# Patient Record
Sex: Male | Born: 1955
Health system: Southern US, Community
[De-identification: ages and names within clinical notes are randomized; demographics above are authoritative.]

## PROBLEM LIST (undated history)

## (undated) DIAGNOSIS — C7951 Secondary malignant neoplasm of bone: Secondary | ICD-10-CM

## (undated) DIAGNOSIS — R0600 Dyspnea, unspecified: Secondary | ICD-10-CM

## (undated) DIAGNOSIS — F32A Depression, unspecified: Secondary | ICD-10-CM

## (undated) DIAGNOSIS — I779 Disorder of arteries and arterioles, unspecified: Secondary | ICD-10-CM

## (undated) DIAGNOSIS — E7800 Pure hypercholesterolemia, unspecified: Secondary | ICD-10-CM

## (undated) DIAGNOSIS — Z789 Other specified health status: Secondary | ICD-10-CM

## (undated) DIAGNOSIS — D329 Benign neoplasm of meninges, unspecified: Secondary | ICD-10-CM

## (undated) DIAGNOSIS — I6381 Other cerebral infarction due to occlusion or stenosis of small artery: Secondary | ICD-10-CM

## (undated) DIAGNOSIS — I723 Aneurysm of iliac artery: Secondary | ICD-10-CM

## (undated) DIAGNOSIS — I2510 Atherosclerotic heart disease of native coronary artery without angina pectoris: Secondary | ICD-10-CM

## (undated) DIAGNOSIS — C61 Malignant neoplasm of prostate: Secondary | ICD-10-CM

## (undated) DIAGNOSIS — E785 Hyperlipidemia, unspecified: Secondary | ICD-10-CM

## (undated) DIAGNOSIS — I214 Non-ST elevation (NSTEMI) myocardial infarction: Secondary | ICD-10-CM

## (undated) DIAGNOSIS — C801 Malignant (primary) neoplasm, unspecified: Secondary | ICD-10-CM

## (undated) DIAGNOSIS — F17200 Nicotine dependence, unspecified, uncomplicated: Secondary | ICD-10-CM

## (undated) DIAGNOSIS — D496 Neoplasm of unspecified behavior of brain: Secondary | ICD-10-CM

## (undated) DIAGNOSIS — I77819 Aortic ectasia, unspecified site: Secondary | ICD-10-CM

## (undated) DIAGNOSIS — R911 Solitary pulmonary nodule: Secondary | ICD-10-CM

## (undated) DIAGNOSIS — Z8739 Personal history of other diseases of the musculoskeletal system and connective tissue: Secondary | ICD-10-CM

## (undated) DIAGNOSIS — F1090 Alcohol use, unspecified, uncomplicated: Secondary | ICD-10-CM

## (undated) DIAGNOSIS — I10 Essential (primary) hypertension: Secondary | ICD-10-CM

---

## 2009-06-25 DIAGNOSIS — I2510 Atherosclerotic heart disease of native coronary artery without angina pectoris: Secondary | ICD-10-CM

## 2009-06-25 HISTORY — PX: CORONARY ANGIOPLASTY WITH STENT PLACEMENT: SHX49

## 2009-06-25 HISTORY — DX: Atherosclerotic heart disease of native coronary artery without angina pectoris: I25.10

## 2013-07-21 ENCOUNTER — Encounter (HOSPITAL_COMMUNITY): Payer: Self-pay | Admitting: Emergency Medicine

## 2013-07-21 ENCOUNTER — Observation Stay (HOSPITAL_COMMUNITY)
Admission: EM | Admit: 2013-07-21 | Discharge: 2013-07-22 | Disposition: A | Discharge status: Home / Self Care | Payer: Self-pay | Attending: Internal Medicine | Admitting: Internal Medicine

## 2013-07-21 ENCOUNTER — Emergency Department (HOSPITAL_COMMUNITY): Payer: Self-pay

## 2013-07-21 DIAGNOSIS — Z9861 Coronary angioplasty status: Secondary | ICD-10-CM | POA: Insufficient documentation

## 2013-07-21 DIAGNOSIS — Z91148 Patient's other noncompliance with medication regimen for other reason: Secondary | ICD-10-CM

## 2013-07-21 DIAGNOSIS — E7800 Pure hypercholesterolemia, unspecified: Secondary | ICD-10-CM | POA: Insufficient documentation

## 2013-07-21 DIAGNOSIS — R079 Chest pain, unspecified: Secondary | ICD-10-CM

## 2013-07-21 DIAGNOSIS — I10 Essential (primary) hypertension: Secondary | ICD-10-CM | POA: Diagnosis present

## 2013-07-21 DIAGNOSIS — Z9114 Patient's other noncompliance with medication regimen: Secondary | ICD-10-CM

## 2013-07-21 DIAGNOSIS — Z720 Tobacco use: Secondary | ICD-10-CM

## 2013-07-21 DIAGNOSIS — E785 Hyperlipidemia, unspecified: Secondary | ICD-10-CM | POA: Diagnosis present

## 2013-07-21 DIAGNOSIS — R0602 Shortness of breath: Secondary | ICD-10-CM | POA: Diagnosis present

## 2013-07-21 DIAGNOSIS — F17200 Nicotine dependence, unspecified, uncomplicated: Secondary | ICD-10-CM | POA: Diagnosis present

## 2013-07-21 DIAGNOSIS — R42 Dizziness and giddiness: Secondary | ICD-10-CM | POA: Diagnosis present

## 2013-07-21 DIAGNOSIS — I2510 Atherosclerotic heart disease of native coronary artery without angina pectoris: Secondary | ICD-10-CM | POA: Diagnosis present

## 2013-07-21 DIAGNOSIS — R0789 Other chest pain: Principal | ICD-10-CM | POA: Insufficient documentation

## 2013-07-21 DIAGNOSIS — F1010 Alcohol abuse, uncomplicated: Secondary | ICD-10-CM | POA: Diagnosis present

## 2013-07-21 DIAGNOSIS — I059 Rheumatic mitral valve disease, unspecified: Secondary | ICD-10-CM

## 2013-07-21 HISTORY — DX: Pure hypercholesterolemia, unspecified: E78.00

## 2013-07-21 HISTORY — DX: Hyperlipidemia, unspecified: E78.5

## 2013-07-21 HISTORY — DX: Essential (primary) hypertension: I10

## 2013-07-21 HISTORY — DX: Nicotine dependence, unspecified, uncomplicated: F17.200

## 2013-07-21 HISTORY — DX: Atherosclerotic heart disease of native coronary artery without angina pectoris: I25.10

## 2013-07-21 LAB — CBC
HCT: 42.4 % (ref 39.0–52.0)
Hemoglobin: 14.3 g/dL (ref 13.0–17.0)
MCH: 29 pg (ref 26.0–34.0)
MCHC: 33.7 g/dL (ref 30.0–36.0)
MCV: 86 fL (ref 78.0–100.0)
Platelets: 187 10*3/uL (ref 150–400)
RBC: 4.93 MIL/uL (ref 4.22–5.81)
RDW: 14.3 % (ref 11.5–15.5)
WBC: 5.4 10*3/uL (ref 4.0–10.5)

## 2013-07-21 LAB — BASIC METABOLIC PANEL
BUN: 12 mg/dL (ref 6–23)
CHLORIDE: 103 meq/L (ref 96–112)
CO2: 24 meq/L (ref 19–32)
Calcium: 9.2 mg/dL (ref 8.4–10.5)
Creatinine, Ser: 1.18 mg/dL (ref 0.50–1.35)
GFR calc non Af Amer: 68 mL/min — ABNORMAL LOW (ref >90)
GFR, EST AFRICAN AMERICAN: 79 mL/min — AB (ref >90)
Glucose, Bld: 97 mg/dL (ref 70–99)
Potassium: 4.7 mEq/L (ref 3.7–5.3)
Sodium: 141 mEq/L (ref 137–147)

## 2013-07-21 LAB — ETHANOL: Alcohol, Ethyl (B): <11 mg/dL (ref 0–11)

## 2013-07-21 LAB — TROPONIN I

## 2013-07-21 LAB — HEPATIC FUNCTION PANEL
ALT: 26 U/L (ref 0–53)
AST: 25 U/L (ref 0–37)
Albumin: 3.5 g/dL (ref 3.5–5.2)
Alkaline Phosphatase: 55 U/L (ref 39–117)
BILIRUBIN TOTAL: 0.3 mg/dL (ref 0.3–1.2)
Total Protein: 6.7 g/dL (ref 6.0–8.3)

## 2013-07-21 LAB — PRO B NATRIURETIC PEPTIDE: Pro B Natriuretic peptide (BNP): 30 pg/mL (ref 0–125)

## 2013-07-21 LAB — LIPID PANEL
CHOL/HDL RATIO: 3.9 ratio
CHOLESTEROL: 231 mg/dL — AB (ref 0–200)
HDL: 60 mg/dL (ref >39)
LDL Cholesterol: 144 mg/dL — ABNORMAL HIGH (ref 0–99)
Triglycerides: 134 mg/dL (ref <150)
VLDL: 27 mg/dL (ref 0–40)

## 2013-07-21 LAB — POCT I-STAT TROPONIN I: TROPONIN I, POC: 0.01 ng/mL (ref 0.00–0.08)

## 2013-07-21 LAB — PHOSPHORUS: Phosphorus: 3 mg/dL (ref 2.3–4.6)

## 2013-07-21 LAB — MAGNESIUM: Magnesium: 2 mg/dL (ref 1.5–2.5)

## 2013-07-21 MED ORDER — ASPIRIN EC 325 MG PO TBEC
325.0000 mg | DELAYED_RELEASE_TABLET | Freq: Once | ORAL | Status: AC
  Administered 2013-07-21: 325 mg via ORAL
  Filled 2013-07-21: qty 1

## 2013-07-21 MED ORDER — ACETAMINOPHEN 650 MG RE SUPP: 650.0000 mg | Freq: Four times a day (QID) | RECTAL | Status: DC | PRN

## 2013-07-21 MED ORDER — ASPIRIN EC 81 MG PO TBEC
81.0000 mg | DELAYED_RELEASE_TABLET | Freq: Every day | ORAL | Status: DC
  Administered 2013-07-22: 81 mg via ORAL
  Filled 2013-07-21: qty 1

## 2013-07-21 MED ORDER — FOLIC ACID 1 MG PO TABS
1.0000 mg | ORAL_TABLET | Freq: Every day | ORAL | Status: DC
  Administered 2013-07-21 – 2013-07-22 (×2): 1 mg via ORAL
  Filled 2013-07-21 (×2): qty 1

## 2013-07-21 MED ORDER — LISINOPRIL 10 MG PO TABS
10.0000 mg | ORAL_TABLET | Freq: Every day | ORAL | Status: DC
  Administered 2013-07-21 – 2013-07-22 (×2): 10 mg via ORAL
  Filled 2013-07-21 (×2): qty 1

## 2013-07-21 MED ORDER — THIAMINE HCL 100 MG/ML IJ SOLN
100.0000 mg | Freq: Every day | INTRAMUSCULAR | Status: DC
  Filled 2013-07-21 (×2): qty 1

## 2013-07-21 MED ORDER — NICOTINE 14 MG/24HR TD PT24
14.0000 mg | MEDICATED_PATCH | Freq: Every day | TRANSDERMAL | Status: DC
  Administered 2013-07-22: 14 mg via TRANSDERMAL
  Filled 2013-07-21 (×2): qty 1

## 2013-07-21 MED ORDER — ALBUTEROL SULFATE (2.5 MG/3ML) 0.083% IN NEBU
5.0000 mg | INHALATION_SOLUTION | Freq: Once | RESPIRATORY_TRACT | Status: DC
  Filled 2013-07-21: qty 6

## 2013-07-21 MED ORDER — LORAZEPAM 2 MG/ML IJ SOLN: 1.0000 mg | Freq: Four times a day (QID) | INTRAMUSCULAR | Status: DC | PRN

## 2013-07-21 MED ORDER — SODIUM CHLORIDE 0.9 % IJ SOLN
3.0000 mL | Freq: Two times a day (BID) | INTRAMUSCULAR | Status: DC
  Administered 2013-07-21: 3 mL via INTRAVENOUS

## 2013-07-21 MED ORDER — LORAZEPAM 1 MG PO TABS: 1.0000 mg | ORAL_TABLET | Freq: Four times a day (QID) | ORAL | Status: DC | PRN

## 2013-07-21 MED ORDER — ADULT MULTIVITAMIN W/MINERALS CH
1.0000 | ORAL_TABLET | Freq: Every day | ORAL | Status: DC
  Administered 2013-07-21 – 2013-07-22 (×2): 1 via ORAL
  Filled 2013-07-21 (×2): qty 1

## 2013-07-21 MED ORDER — ACETAMINOPHEN 325 MG PO TABS: 650.0000 mg | ORAL_TABLET | Freq: Four times a day (QID) | ORAL | Status: DC | PRN

## 2013-07-21 MED ORDER — ENOXAPARIN SODIUM 40 MG/0.4ML ~~LOC~~ SOLN
40.0000 mg | SUBCUTANEOUS | Status: DC
  Administered 2013-07-21: 40 mg via SUBCUTANEOUS
  Filled 2013-07-21 (×2): qty 0.4

## 2013-07-21 MED ORDER — VITAMIN B-1 100 MG PO TABS
100.0000 mg | ORAL_TABLET | Freq: Every day | ORAL | Status: DC
  Administered 2013-07-21 – 2013-07-22 (×2): 100 mg via ORAL
  Filled 2013-07-21 (×2): qty 1

## 2013-07-21 NOTE — H&P (Signed)
Date: 07/21/2013               Patient Name:  [name] [name] MRN: [id number]  DOB: [birth year] Age / Sex: 58 y.o., male   PCP: No primary provider on file.              Medical Service: Internal Medicine Teaching Service              Attending Physician: Dr. Dominic Pea, DO    First Contact: Maryclare Bean, Med Student Pager: [phone]  Second Contact: Dr. Lesly Dukes Pager: [phone]  Third Contact Dr. Karlyn Agee Pager: [phone]       After Hours (After 5p/  First Contact Pager: [phone]  weekends / holidays): Second Contact Pager: [phone]    Chief Complaint: shortness of breath  History of Present Illness: Mr. [name] [name] is a 58yo male with PMH of CAD s/p stent in 2011, HTN, and HLD who presents with worsening SOB and lightheadedness for the last 10 days. He states his SOB is worse with walking and improves with rest. His lightheadedness feel as if he is about to pass out, but he denies syncopal events. He denies chest pain, chest pressure, radiation, orthopnea, PND, or swelling. He denies leg pain, leg swelling, recent immobilization, or hemoptysis. He denies recent illness, fever, chills, night sweats, myalgias, sore throat, nasal congestion, cough or sick contacts. He denies vertigo-like symptoms, confusion or numbness. He denies abdominal pain or N/V/D/C. He denies similar symptoms in the past including his prior heart catheterization in 2011.  The patient recalls being previously treated with antihypertensive medications, Plavix, and other unknown medications after his stent in 2011 which was placed at [facility] in [place]. He was noncompliant with his medications and does not currently take aspirin. He does not have a PCP, is uninsured, and is unemployed. He recently moved from the [place], [place], to [place], [place], due to financial limitations and family in the area (currently lives with his mother, brother, and sister).  The patient currently smokes 0.5 ppd for the last 30 years and  denies prior diagnoses of pulmonary disease including asthma and emphysema. He consumes alcohol daily and drinks "two fifths per week" with his last drink occuring yesterday. He denies prior alcohol withdrawal symptoms including seizures. He previously smoked marijuana but denies other illicit drug use.     In the ED he was given aspirin 325 mg x 1 dose.  Meds: No current facility-administered medications for this encounter.   No current outpatient prescriptions on file.   Allergies: Allergies as of 07/21/2013  . (No Known Allergies)   Past Medical History: Past Medical History  Diagnosis Date  . High cholesterol   . Coronary artery disease     s/p stent   . HTN (hypertension)   . Smoker     1/2 ppd x >30 years    Past Surgical History: Past Surgical History  Procedure Laterality Date  . Coronary angioplasty with stent placement Left 2011   Family History: Family History  Problem Relation Age of Onset  . Hypertension      grandparents     Social History: History   Social History  . Marital Status: Single    Spouse Name: N/A    Number of Children: N/A  . Years of Education: N/A   Occupational History  . unemployed    Social History Main Topics  . Smoking status: Current Every Day Smoker -- 0.50 packs/day for 30 years  . Smokeless tobacco: Not  on file  . Alcohol Use: Yes     Comment: Drinks 2 fifths in one week (total of ~1539m)  . Drug Use: No  . Sexual Activity: Not on file   Other Topics Concern  . Not on file   [place] History Narrative   [place]    Unemployed    Drinks 2 (1/5)th in 1 week               Review of Systems: see HPI. Otherwise negative.  Physical Exam: Blood pressure 153/82, pulse 74, temperature 98.7 F (37.1 C), temperature source Oral, resp. rate 24, SpO2 98.00%. on RA. General: Alert, well-appearing, NAD. Appears stated age. HEENT: NCAT, PERRLA, conjunctiva  anicteric and without injection. Nares wnl and without discharge or erythema. Neck supple and without LAD. No tenderness to palpation. Pulmonary: CTAB, no incr WOB. No chest wall tenderness or deformity. Cardiovascular: RRR, S1 and S2 normal, no murmur, rub, or gallop appreciated. No carotid bruits or JVD. All four extremities warm, well-perfused, without cyanosis or edema. 2+ radial, DP, PT pulses bilaterally. Abdomen: Bowel sounds active. Soft, NTND, no masses, no hepatosplenomegaly.   Skin: Skin is warm and dry. He is not diaphoretic.   Neurologic/MSK: Alert and oriented x 3. GCS score is 15. Sensation intact throughout to light touch and pressure. Motor strength 5/5 in lower extremities bilaterally. No lower extremity tenderness to palpation. Psychiatric: Affect is blunted  Lab results: Recent Results (from the past 2160 hour(s))  PRO B NATRIURETIC PEPTIDE     Status: None   Collection Time    07/21/13 12:52 PM      Result Value Range   Pro B Natriuretic peptide (BNP) 30.0  0 - 125 pg/mL  CBC     Status: None   Collection Time    07/21/13  1:00 PM      Result Value Range   WBC 5.4  4.0 - 10.5 K/uL   RBC 4.93  4.22 - 5.81 MIL/uL   Hemoglobin 14.3  13.0 - 17.0 g/dL   HCT 42.4  39.0 - 52.0 %   MCV 86.0  78.0 - 100.0 fL   MCH 29.0  26.0 - 34.0 pg   MCHC 33.7  30.0 - 36.0 g/dL   RDW 14.3  11.5 - 15.5 %   Platelets 187  150 - 400 K/uL  BASIC METABOLIC PANEL     Status: Abnormal   Collection Time    07/21/13  1:00 PM      Result Value Range   Sodium 141  137 - 147 mEq/L   Potassium 4.7  3.7 - 5.3 mEq/L   Chloride 103  96 - 112 mEq/L   CO2 24  19 - 32 mEq/L   Glucose, Bld 97  70 - 99 mg/dL   BUN 12  6 - 23 mg/dL   Creatinine, Ser 1.18  0.50 - 1.35 mg/dL   Calcium 9.2  8.4 - 10.5 mg/dL   GFR calc non Af Amer 68 (*) >90 mL/min   GFR calc Af Amer 79 (*) >90 mL/min   Comment: (NOTE)     The eGFR has been calculated using the CKD EPI equation.     This calculation has not been  validated in all clinical situations.     eGFR's persistently <90 mL/min signify possible Chronic Kidney     Disease.  POCT I-STAT TROPONIN I     Status:  None   Collection Time    07/21/13  1:22 PM      Result Value Range   Troponin i, poc 0.01  0.00 - 0.08 ng/mL   Comment 3            Comment: Due to the release kinetics of cTnI,     a negative result within the first hours     of the onset of symptoms does not rule out     myocardial infarction with certainty.     If myocardial infarction is still suspected,     repeat the test at appropriate intervals.    Imaging results:  Dg Chest 2 View  07/21/2013   CLINICAL DATA:  Short of breath.  Dizziness.  Lightheadedness.  EXAM: CHEST  2 VIEW  COMPARISON:  None.  FINDINGS: Cardiopericardial silhouette within normal limits. Mediastinal contours normal. Trachea midline. No airspace disease or effusion. Mild hyperinflation suggesting early emphysema.  IMPRESSION: No active cardiopulmonary disease.   Electronically Signed   By: Dereck Ligas M.D.   On: 07/21/2013 14:31    Other results: EKG: There are no previous tracings available for comparison. NSR with rate of 88. Right atrial enlargement with possible right heart strain pattern (P7T0G2). T wave inversions present in Lead III only with no other ST or T wave abnormalities noted. QTc of 425.  Assessment & Plan by Problem: Principal Problem:   SOB (shortness of breath) on exertion Active Problems:   Dizziness   HTN (hypertension)   Tobacco abuse   Alcohol abuse   CAD (coronary artery disease)   H/O medication noncompliance  Mr. Prins is a 58yo male with PMH of CAD s/p stent in 2011, HTN, and HLD who presents with worsening SOB with exertion and lightheadedness for the last 10 days.  # SOB on exertion and lightheadedness, likely due to emphysema/COPD- Patient has no history of pulmonary disease and lacks PFTs on record but is a current smoker and has a 15 pack-year history. O2 sats  98+% on RA, VSS. CXR negative for acute cardiopulmonary disease but showed mild hyperinflation suggestive of early emphysema. He has PMH of CAD s/p stent with poor medication compliance but ACS or MI less likely given lack of chest pain or pressure, lack of edema, ProBNP wnl at 30. Point of care Troponin x 1 negative. EKG suggestive of right heart strain pattern (S1Q3T3) and RAE. He was given ASA 348m x 1 dose in the ED. Serial Troponins pending and repeat EKG in AM to r/o MI. TIMI score of 1-2 (low probability). PE much less likely with WELLS score of 0 and lack of recent immobilization. 1/4 SIRS criteria met (tachypnea to 35 but has since resolved) but infectious etiology much less likely given afebrile, lack of other systemic symptoms, unremarkable physical exam, and CBC wnl (WBC 5.4, Hb 14.3, Plt 187).  -Admit to IMTS with continuous pulse-ox and telemetry -Serial Troponins pending and repeat EKG in AM to r/o MI -Will repeat BMP in AM to assess for electrolyte abnormalities or changes -Mg and Phos pending -Will assess for orthostatic hypotension and desaturation with ambulation  -2D Echo pending to evaluate for evidence of heart failure and particularly right heart strain. Will consider stress test in AM. -Infectious etiology work-up pending: U/A, flu panel -Continue ASA 81 mg daily -Start Tylenol 650 mg q 6 hrs prn pain -[place] # Uncontrolled HTN - Patient is asymptomatic and was previously treated for HTN but denies  current medications due to noncompliance. BP 166/92 on admission with SBP max of 176. BMP wnl with BUN 12 and creatinine 1.18. -Start Lisinopril 87m daily and consider a different antihypertensive given prior treatment success  # Hyperlipidemia - Patient reports history of HLD but no lipid panel on record. Patient not currently taking any medications. -Lipid panel pending  # Tobacco abuse - Patient reports smoking 1/2 ppd for the last 30 years.  Will monitor pulse ox with ambulation and recommend outpatient PFTs if none on record.  -Nicotine patch offered but patient declined -Smoking cessation counseling prior to discharge  # Alcohol abuse - Patient reports drinking alcohol daily, consuming 2 fifths per week (~15051mtotal) with his last drink occurring yesterday. He denies prior withdrawal symptoms including seizures.  -CIWA protocol to monitor withdrawal symptoms -Ethyl alcohol and UDS level pending -Thiamine 100 mg daily -Folate 1 mg daily -Multivitamin 1 tablet daily  # Health maintenance - [place] does not see a PCP [place] - Hgb A1c pending - LFTs pending - Lipid panel pending - SW and Care Mgmt consulted  DVT PPx: Subcutaneous Lovenox and SCDs  Dispo: Disposition is deferred at this time; awaiting improvement of current medical problems.   The patient does not have a current PCP (No primary provider on file.) and [facility].   The patient does not have transportation limitations that hinder transportation to clinic appointments.  No Follow-up on file.  This is a Me[employer] information officerote.  The care of the patient was discussed with Dr. CaLucila Mainend the assessment and plan was formulated with their assistance.  Please see their note for official documentation of the patient encounter.   Signed: KiMaryclare [email or web address] Pager [phone]/27/2015

## 2013-07-21 NOTE — Progress Notes (Signed)
  Echocardiogram 2D Echocardiogram has been performed.  Mauricio Po 07/21/2013, 5:01 PM

## 2013-07-21 NOTE — Progress Notes (Signed)
Pt AO x 4, VSS, denies any CP or SOB at this time, medications given as ordered, pt refuses to have Nicotine patch placed as ordered, refers is not need for this, pt not ready to quit smoking at this time, smoking cessation education given. Will continue with POC.

## 2013-07-21 NOTE — ED Notes (Signed)
Pt reports feeling light headed and dizziness x1 week, mid-sternum chest pain and SOB starting today. Pt denies any radiation, nausea, vomiting, cough, congestion, or abd/back pain

## 2013-07-21 NOTE — ED Provider Notes (Signed)
CSN: [id number]     Arrival date & time 07/21/13  1215 History   First MD Initiated Contact with Patient 07/21/13 1302     Chief Complaint  Patient presents with  . Dizziness  . Chest Pain  . Shortness of Breath   (Consider location/radiation/quality/duration/timing/severity/associated sxs/prior Treatment) HPI Adaiah Morken is a 58yo AA male with a PMH of stent placement (2011) and hyperlipidemia who presents today with worsening SOB and dizzyness over the past ~10 days. These symptoms presented shortly after a 7-day long chest pressure that was triggered by "overdoing it" with push-ups. He describes the SOB and dizzyness as similar to the days preceding his stent placement. Walking seems to be associated with the dizziness and he endorses DOE. He denies palpitations, orthopnea, cough, weakness, vertigo-like symptoms, mental status changes, focal neuro deficits, paresthesias, N/V/C/D, fever, chill, night sweats, and any recent illness or trauma. He smokes 1/2ppd (22pkyrs), though denies alcohol / drug abuse. He denies any PMH of HTN, DM, or asthma and takes no medications.    Past Medical History  Diagnosis Date  . High cholesterol   . Coronary artery disease    Past Surgical History  Procedure Laterality Date  . Coronary angioplasty with stent placement Left 2011   History reviewed. No pertinent family history. History  Substance Use Topics  . Smoking status: Current Every Day Smoker  . Smokeless tobacco: Not on file  . Alcohol Use: Yes    Review of Systems All other systems negative except as documented in the HPI. All pertinent positives and negatives as reviewed in the HPI. Allergies  Review of patient's allergies indicates no known allergies.  Home Medications  No current outpatient prescriptions on file. BP 176/89  Pulse 77  Temp(Src) 98.7 F (37.1 C) (Oral)  Resp 35  SpO2 100% Physical Exam  Nursing note and vitals reviewed. Constitutional: He is oriented to  person, place, and time. He appears well-developed and well-nourished. No distress.  HENT:  Head: Normocephalic and atraumatic.  Mouth/Throat: Oropharynx is clear and moist.  Eyes: Pupils are equal, round, and reactive to light.  Neck: Normal range of motion. Neck supple.  Cardiovascular: Normal rate, regular rhythm, normal heart sounds and intact distal pulses.  Exam reveals no gallop and no friction rub.   No murmur heard. Pulses:      Radial pulses are 2+ on the right side, and 2+ on the left side.       Dorsalis pedis pulses are 2+ on the right side, and 2+ on the left side.       Posterior tibial pulses are 2+ on the right side, and 2+ on the left side.  Pulmonary/Chest: Effort normal and breath sounds normal. No respiratory distress. He has no wheezes. He has no rales. He exhibits no tenderness.  Abdominal: Soft. Bowel sounds are normal. He exhibits no distension. There is no tenderness.  Musculoskeletal: He exhibits no edema.  Neurological: He is alert and oriented to person, place, and time. He exhibits normal muscle tone. Coordination normal.  Skin: Skin is warm and dry. No rash noted. He is not diaphoretic. No erythema.  No edema or discoloration.    ED Course  Procedures (including critical care time) Labs Review Labs Reviewed  CBC  BASIC METABOLIC PANEL  PRO B NATRIURETIC PEPTIDE  POCT I-STAT TROPONIN I   MDM  No diagnosis found. . Troponin, BNP, CBC were all WNL.  Patient be admitted to the hospital for further evaluation and due to the  fact that he has history of coronary artery disease.  Patient is advised of the plan.  All questions were answered.  I spoke with the, internal medicine resident will be down admit the patient to the hospital  Brent General, PA-C 07/23/13 0920

## 2013-07-21 NOTE — H&P (Signed)
Date: 07/21/2013               Patient Name:  [name] [name] MRN: [id number]  DOB: [birth year] Age / Sex: 58 y.o., male   PCP: No primary provider on file.         Medical Service: Internal Medicine Teaching Service         Attending Physician: Dr. Dominic Pea, DO    First Contact: Dr. Lesly Dukes Pager: [phone]  Second Contact: Dr. Karlyn Agee Pager: [phone]       After Hours (After 5p/  First Contact Pager: [phone]  weekends / holidays): Second Contact Pager: [phone]   Chief Complaint: Shortness of breath  History of Present Illness:  [name] [name] is a 58 y.o. male from [place] with PMH HTN, HLD, CAD s/p LHC with stent placement in 2011, who presents to the ED with a chief complaint of shortness of breath with exertion.  Patient notes that he has had worsening shortness of breath and dizziness over the past approximately 10 days. His shortness of breath and dizziness are both worse with walking/exertion. He describes his dizziness as lightheadedness, like a sensation that he might pass out. Denies syncope. This has never happened to him before.  He denies chest pain or chest pressure. He denies cough, sore throat. He denies orthopnea, PND, leg swelling. He denies weakness, vertigo-like symptoms, confusion, numbness. He denies nausea, vomiting, diarrhea, fevers, chills. He denies calf pain, unilateral calf swelling or erythema. No recent long car or plane rides. He denies any recent illness. No sick contacts at home.  The patient recently moved to [place] from the [place], [place]. He is unemployed. He decided to move here because he has family locally and the cost of living is low. He was previously hospitalized at [facility] in [place] in 2011, when he presented with chest pain. They did a left heart catheterization and placed a stent. He was discharged on Plavix and several other medications, but was noncompliant. He has not seen a doctor in many years.  The patient  smokes half a pack of cigarettes per day. He drinks a fifth of liquor twice a week. He denies a history of alcohol withdrawal or seizures. He denies illicit drug abuse, but does tell us that he used to smoke marijuana. He takes no medications or supplements at home. He denies a history of diabetes, asthma, COPD.  In the ED, we gave him an aspirin 325 mg x1.   Meds: None  Allergies: Allergies as of 07/21/2013  . (No Known Allergies)   Past Medical History  Diagnosis Date  . High cholesterol   . Coronary artery disease     s/p stent   . HTN (hypertension)   . Smoker     1/2 ppd x >30 years    Past Surgical History  Procedure Laterality Date  . Coronary angioplasty with stent placement Left 2011   Family History  Problem Relation Age of Onset  . Hypertension      grandparents    History   Social History  . Marital Status: Single    Spouse Name: N/A    Number of Children: N/A  . Years of Education: N/A   Occupational History  . Not on file.   Social History Main Topics  . Smoking status: Current Every Day Smoker  . Smokeless tobacco: Not on file  . Alcohol Use: Yes  . Drug Use: No  . Sexual Activity: Not on file  Other Topics Concern  . Not on file   Social History Narrative   From [place] ([place]    Family lives in [place] (brother, sister and mother)    Unemployed    Drinks 2 (1/5)th in 1 week                Review of Systems: Pertinent items are noted in HPI.  Physical Exam: Blood pressure 153/82, pulse 74, temperature 98.7 F (37.1 C), temperature source Oral, resp. rate 24, SpO2 98.00%. Physical Exam  Constitutional: He is oriented to person, place, and time and well-developed, well-nourished, and in no distress.  HENT:  Head: Normocephalic and atraumatic.  Eyes: Conjunctivae and EOM are normal. Pupils are equal, round, and reactive to light.  Neck: Normal range of motion. Neck supple. No JVD present.  Cardiovascular: Normal rate, regular  rhythm, normal heart sounds and intact distal pulses.  Exam reveals no gallop and no friction rub.   No murmur heard. No lower extremity edema.  Pulmonary/Chest: Effort normal and breath sounds normal. No respiratory distress. He has no wheezes. He has no rales. He exhibits no tenderness.  Abdominal: Soft. Bowel sounds are normal. He exhibits no distension. There is no tenderness.  Musculoskeletal: Normal range of motion. He exhibits no edema and no tenderness.  Neurological: He is alert and oriented to person, place, and time. GCS score is 15.  Skin: Skin is warm and dry. He is not diaphoretic.  Psychiatric:  Blunt/disinterested affect.    Lab results: Basic Metabolic Panel:  Recent Labs  07/21/13 1300  NA 141  K 4.7  CL 103  CO2 24  GLUCOSE 97  BUN 12  CREATININE 1.18  CALCIUM 9.2   Liver Function Tests: No results found for this basename: AST, ALT, ALKPHOS, BILITOT, PROT, ALBUMIN,  in the last 72 hours No results found for this basename: LIPASE, AMYLASE,  in the last 72 hours No results found for this basename: AMMONIA,  in the last 72 hours CBC:  Recent Labs  07/21/13 1300  WBC 5.4  HGB 14.3  HCT 42.4  MCV 86.0  PLT 187   Cardiac Enzymes: No results found for this basename: CKTOTAL, CKMB, CKMBINDEX, TROPONINI,  in the last 72 hours BNP:  Recent Labs  07/21/13 1252  PROBNP 30.0   D-Dimer: No results found for this basename: DDIMER,  in the last 72 hours CBG: No results found for this basename: GLUCAP,  in the last 72 hours Hemoglobin A1C: No results found for this basename: HGBA1C,  in the last 72 hours Fasting Lipid Panel: No results found for this basename: CHOL, HDL, LDLCALC, TRIG, CHOLHDL, LDLDIRECT,  in the last 72 hours Thyroid Function Tests: No results found for this basename: TSH, T4TOTAL, FREET4, T3FREE, THYROIDAB,  in the last 72 hours Anemia Panel: No results found for this basename: VITAMINB12, FOLATE, FERRITIN, TIBC, IRON, RETICCTPCT,  in  the last 72 hours Coagulation: No results found for this basename: LABPROT, INR,  in the last 72 hours Urine Drug Screen: Drugs of Abuse  No results found for this basename: labopia,  cocainscrnur,  labbenz,  amphetmu,  thcu,  labbarb    Alcohol Level: No results found for this basename: ETH,  in the last 72 hours Urinalysis: No results found for this basename: COLORURINE, APPERANCEUR, LABSPEC, PHURINE, GLUCOSEU, HGBUR, BILIRUBINUR, KETONESUR, PROTEINUR, UROBILINOGEN, NITRITE, LEUKOCYTESUR,  in the last 72 hours   Imaging results:  Dg Chest 2 View  07/21/2013   CLINICAL DATA:  Short of breath.  Dizziness.  Lightheadedness.  EXAM: CHEST  2 VIEW  COMPARISON:  None.  FINDINGS: Cardiopericardial silhouette within normal limits. Mediastinal contours normal. Trachea midline. No airspace disease or effusion. Mild hyperinflation suggesting early emphysema.  IMPRESSION: No active cardiopulmonary disease.   Electronically Signed   By: Dereck Ligas M.D.   On: 07/21/2013 14:31    Other results: EKG: There are no previous tracings available for comparison. Rate 88, normal sinus rhythm, right atrial enlargement, possibly a right strain pattern (S1, Q3, T3) with isolated TWI in lead III only, no other ST/T wave abnormalities.  Assessment & Plan by Problem: [name] [name] is a 58 y.o. male from [place] with PMH HLD, CAD s/p LHC with stent placement in 2011, who presents to the ED with a chief complaint of shortness of breath with exertion.  #SOB (shortness of breath) on exertion - Patient presents with chief complaint of exertional shortness of breath x10 days. All vital signs are stable. Patient is saturating 100% on room air. There is no tachycardia. He is afebrile. Patient is status post ASA 325 mg x1. Chest x-ray shows mild hyperinflation, suggesting early emphysema. Patient is a smoker. He may have undiagnosed COPD. His EKG is remarkable for a possible right heart strain pattern. Wells score 0.  ProBNP is 30 (wnl). We will get a 2-D echocardiogram to evaluate his heart function and check for pulmonary hypertension/cor pulmonale. Patient has a history of CAD s/p stent placement in 2011. However, he denies chest pain or pressure. Point-of-care troponin was negative. We will rule out MI with serial troponins and repeat EKG in the morning. TIMI score 1-2: 5-8% risk at 14 days of: all-cause mortality, new or recurrent MI, or severe recurrent ischemia requiring urgent revascularization (low probability). - Admit to IMTS, telemetry - Cycling troponins x3 - 2-D echocardiogram - Check orthostatics - Check pulse oximetry while ambulating - Flu panel - Urinalysis - Urine rapid drug screen - ASA 81mg  daily - Tylenol 650 mg every 6 hours when necessary for pain - Low sodium diet - Obtain medical records from [place] in am  #HTN - Blood pressure currently 140-170s/80s. He is on no antihypertensives at home. - Starting Lisinopril 10mg  daily  #Alcohol abuse - Patient tells [place] his drinks 2 fifths of liquor per week. - Alcohol level - CIWA protocol - Thiamine 100mg  daily - Folate 1mg  daily - Multivitamin 1 tablet daily  #Tobacco abuse - Patient smokes half a pack of cigarettes per day. - Nurse to provide smoking cessation education - Nicoderm patch 14mg  daily  #Health maintenance - Patient has not established medical care here and is uninsured. - Hemoglobin A1C - Hepatic function panel - Lipid panel - Magnesium, phosphorus - Care management consult - Social work consult  #DVT PPX - Subcutaneous Lovenox and SCDs   Dispo: Disposition is deferred at this time, awaiting improvement of current medical problems. Anticipated discharge in approximately 1-3 day(s).   The patient does not have a current PCP (No primary provider on file.) and does need an [facility] hospital follow-up appointment after discharge.  The patient does not have transportation limitations that hinder transportation  to clinic appointments.  Signed: Lesly Dukes, MD 07/21/2013, 4:59 PM   Lesly Dukes, MD  [email or web address] Pager # [phone] Office # [phone]

## 2013-07-21 NOTE — H&P (Signed)
  I have seen and examined the patient myself, and I have reviewed the note by Maryclare Bean, MS 3 and was present during the interview and physical exam.  Please see my separate H&P for additional findings, assessment, and plan.   Signed: Lesly Dukes, MD 07/21/2013, 6:39 PM

## 2013-07-22 DIAGNOSIS — F17200 Nicotine dependence, unspecified, uncomplicated: Secondary | ICD-10-CM

## 2013-07-22 DIAGNOSIS — F1010 Alcohol abuse, uncomplicated: Secondary | ICD-10-CM

## 2013-07-22 DIAGNOSIS — I10 Essential (primary) hypertension: Secondary | ICD-10-CM

## 2013-07-22 DIAGNOSIS — I2510 Atherosclerotic heart disease of native coronary artery without angina pectoris: Secondary | ICD-10-CM

## 2013-07-22 DIAGNOSIS — E785 Hyperlipidemia, unspecified: Secondary | ICD-10-CM

## 2013-07-22 DIAGNOSIS — R0602 Shortness of breath: Secondary | ICD-10-CM

## 2013-07-22 LAB — INFLUENZA PANEL BY PCR (TYPE A & B)
H1N1 flu by pcr: NOT DETECTED
INFLAPCR: NEGATIVE
Influenza B By PCR: NEGATIVE

## 2013-07-22 LAB — RAPID URINE DRUG SCREEN, HOSP PERFORMED
AMPHETAMINES: NOT DETECTED
BARBITURATES: NOT DETECTED
Benzodiazepines: NOT DETECTED
Cocaine: NOT DETECTED
Opiates: NOT DETECTED
TETRAHYDROCANNABINOL: NOT DETECTED

## 2013-07-22 LAB — BASIC METABOLIC PANEL
BUN: 15 mg/dL (ref 6–23)
CO2: 23 mEq/L (ref 19–32)
CREATININE: 1.31 mg/dL (ref 0.50–1.35)
Calcium: 8.9 mg/dL (ref 8.4–10.5)
Chloride: 104 mEq/L (ref 96–112)
GFR calc non Af Amer: 60 mL/min — ABNORMAL LOW (ref >90)
GFR, EST AFRICAN AMERICAN: 69 mL/min — AB (ref >90)
Glucose, Bld: 85 mg/dL (ref 70–99)
Potassium: 4.3 mEq/L (ref 3.7–5.3)
Sodium: 142 mEq/L (ref 137–147)

## 2013-07-22 LAB — HEMOGLOBIN A1C
Hgb A1c MFr Bld: 5.8 % — ABNORMAL HIGH (ref <5.7)
MEAN PLASMA GLUCOSE: 120 mg/dL — AB (ref <117)

## 2013-07-22 LAB — URINALYSIS, ROUTINE W REFLEX MICROSCOPIC
BILIRUBIN URINE: NEGATIVE
GLUCOSE, UA: NEGATIVE mg/dL
Hgb urine dipstick: NEGATIVE
KETONES UR: 15 mg/dL — AB
Leukocytes, UA: NEGATIVE
Nitrite: NEGATIVE
PH: 5.5 (ref 5.0–8.0)
Protein, ur: NEGATIVE mg/dL
Specific Gravity, Urine: 1.029 (ref 1.005–1.030)
Urobilinogen, UA: 0.2 mg/dL (ref 0.0–1.0)

## 2013-07-22 LAB — TROPONIN I

## 2013-07-22 MED ORDER — SIMVASTATIN 20 MG PO TABS: 40.0000 mg | ORAL_TABLET | Freq: Every day | ORAL | Status: DC

## 2013-07-22 MED ORDER — LOVASTATIN 20 MG PO TABS: 40.0000 mg | ORAL_TABLET | Freq: Every day | ORAL | Status: DC

## 2013-07-22 MED ORDER — ADULT MULTIVITAMIN W/MINERALS CH: 1.0000 | ORAL_TABLET | Freq: Every day | ORAL | Status: DC

## 2013-07-22 MED ORDER — SIMVASTATIN 20 MG PO TABS
20.0000 mg | ORAL_TABLET | Freq: Every day | ORAL | Status: DC
  Filled 2013-07-22: qty 1

## 2013-07-22 MED ORDER — ASPIRIN 81 MG PO TBEC: 81.0000 mg | DELAYED_RELEASE_TABLET | Freq: Every day | ORAL | Status: DC

## 2013-07-22 MED ORDER — LISINOPRIL 10 MG PO TABS: 10.0000 mg | ORAL_TABLET | Freq: Every day | ORAL | Status: DC

## 2013-07-22 NOTE — Discharge Summary (Signed)
Name: [name] [name] MRN: [id number] DOB: February 06, 1956 58 y.o. PCP: No primary provider on file.  Date of Admission: 07/21/2013  1:01 PM Date of Discharge: 07/22/2013 Attending Physician: Dominic Pea, DO  Discharge Diagnosis: Principal Problem:   SOB (shortness of breath) on exertion Active Problems:   Dizziness   HTN (hypertension)   Tobacco abuse   Alcohol abuse   CAD (coronary artery disease)   H/O medication noncompliance   HLD (hyperlipidemia)  Discharge Medications:   Medication List         aspirin 81 MG EC tablet  Take 1 tablet (81 mg total) by mouth daily.     lisinopril 10 MG tablet  Commonly known as:  PRINIVIL,ZESTRIL  Take 1 tablet (10 mg total) by mouth daily.     lovastatin 20 MG tablet  Commonly known as:  MEVACOR  Take 2 tablets (40 mg total) by mouth at bedtime.     multivitamin with minerals Tabs tablet  Take 1 tablet by mouth daily.        Disposition and follow-up:   [name] [name] was discharged from [facility] in Good condition.  At the hospital follow up visit please address:  1.  Shortness of breath, dizziness? BP control? He needs to meet with Marlana Latus about an [employer]. Consider transitioning to high dose statin once finances allow.  2.  Labs / imaging needed at time of follow-up: PFTs  3.  Pending labs/ test needing follow-up: None  Follow-up Appointments: Follow-up Information   Follow up with LI, NA, MD On 07/30/2013. (at [facility])    Specialty:  Internal Medicine   Contact information:   [address] [place] [address] [phone]       Discharge Instructions: Discharge Orders   Future Appointments Provider Department Dept Phone   07/30/2013 9:00 AM Charlann Lange, MD [place] [phone]   Future Orders Complete By Expires   Activity as tolerated - No restrictions  As directed    Call MD for:  difficulty breathing, headache or visual disturbances  As directed    Call MD  for:  persistant dizziness or light-headedness  As directed    Call MD for:  temperature >100.4  As directed    Diet - low sodium heart healthy  As directed    No wound care  As directed       Consultations:  None  Procedures Performed:  Dg Chest 2 View  07/21/2013   CLINICAL DATA:  Short of breath.  Dizziness.  Lightheadedness.  EXAM: CHEST  2 VIEW  COMPARISON:  None.  FINDINGS: Cardiopericardial silhouette within normal limits. Mediastinal contours normal. Trachea midline. No airspace disease or effusion. Mild hyperinflation suggesting early emphysema.  IMPRESSION: No active cardiopulmonary disease.   Electronically Signed   By: Dereck Ligas M.D.   On: 07/21/2013 14:31    2D Echo:  ------------------------------------------------------------ Transthoracic Echocardiography  Patient: [name] [name], [name] [name] MR #: [id number] Study Date: 07/21/2013 Gender: M Age: 37 Height: 188cm Weight: 86.6kg BSA: 2.66m^2 Pt. Status: Room: A07C  SONOGRAPHER Mauricio Po, RDCS, CCT ATTENDING Ceasar Mons Horton, Courtney PERFORMING [facility], Inpatient cc:  ------------------------------------------------------------ LV EF: 60% - 65%  ------------------------------------------------------------ Indications: Dyspnea 786.09.  ------------------------------------------------------------ History: PMH: Angina pectoris. Risk factors: Current tobacco use. Hypertension.  ------------------------------------------------------------ Study Conclusions  - Left ventricle: The cavity size was normal. Systolic function was normal. The estimated ejection fraction was in the range of 60% to 65%. Wall motion was normal; there  were no regional wall motion abnormalities. Doppler parameters are consistent with abnormal left ventricular relaxation (grade 1 diastolic dysfunction). - Aortic valve: Trileaflet; normal thickness leaflets. No regurgitation. - Mitral valve: Structurally normal valve.  Mild regurgitation. - Left atrium: The atrium was normal in size. - Right ventricle: The cavity size was normal. Wall thickness was normal. Systolic function was normal. - Right atrium: The atrium was normal in size. - Tricuspid valve: No regurgitation. - Pulmonary arteries: Systolic pressure was within the normal range. - Pericardium, extracardiac: There was no pericardial effusion. Transthoracic echocardiography. M-mode, complete 2D, spectral Doppler, and color Doppler. Height: Height: 188cm. Height: 74in. Weight: Weight: 86.6kg. Weight: 190.6lb. Body mass index: BMI: 24.5kg/m^2. Body surface area: BSA: 2.28m^2. Patient status: Inpatient. Location: Emergency department.  ------------------------------------------------------------   Admission HPI:  [name] [name] is a 58 y.o. male from [place] with PMH HTN, HLD, CAD s/p LHC with stent placement in 2011, who presents to the ED with a chief complaint of shortness of breath with exertion.   Patient notes that he has had worsening shortness of breath and dizziness over the past approximately 10 days. His shortness of breath and dizziness are both worse with walking/exertion. He describes his dizziness as lightheadedness, like a sensation that he might pass out. Denies syncope. This has never happened to him before.   He denies chest pain or chest pressure. He denies cough, sore throat. He denies orthopnea, PND, leg swelling. He denies weakness, vertigo-like symptoms, confusion, numbness. He denies nausea, vomiting, diarrhea, fevers, chills. He denies calf pain, unilateral calf swelling or erythema. No recent long car or plane rides. He denies any recent illness. No sick contacts at home.   The patient recently moved to [place] from the [place], [place]. He is unemployed. He decided to move here because he has family locally and the cost of living is low. He was previously hospitalized at [facility] in [place] in 2011, when he  presented with chest pain. They did a left heart catheterization and placed a stent. He was discharged on Plavix and several other medications, but was noncompliant. He has not seen a doctor in many years.   The patient smokes half a pack of cigarettes per day. He drinks a fifth of liquor twice a week. He denies a history of alcohol withdrawal or seizures. He denies illicit drug abuse, but does tell us that he used to smoke marijuana. He takes no medications or supplements at home. He denies a history of diabetes, asthma, COPD.   In the ED, we gave him an aspirin 325 mg x1.   Hospital Course by problem list: [name] [name] is a 58 y.o. male from [place] with PMH HLD, CAD s/p LHC with stent placement in 2011, who presents to the ED with a chief complaint of shortness of breath with exertion.   1. SOB (shortness of breath) on exertion - Patient presented with chief complaint of exertional shortness of breath x10 days. All vital signs were stable. Patient was saturating 100% on room air. There was no tachycardia. He was afebrile. Chest x-ray showed mild hyperinflation, suggesting early emphysema. Patient is a smoker. He may have undiagnosed COPD. His EKG was remarkable for a possible right heart strain pattern. No prior EKG for comparison. Wells score 0. ProBNP was 30 (wnl).  2D echo showed normal systolic function, EF 60-65%, no regional wall motion abnormalities, grade 1 diastolic dysfunction, normal PA pressure. Patient has a history of CAD s/p stent placement in 2011. However,  he denied chest pain or pressure. Troponins were trended and negative x3. Orthostatics were negative (lying 168/89 PR 78, standing 155/105 PR 70). UA with 15 ketones. UDS/ethanol level negative. Flu negative. Recommend outpatient PFTs to tease out a lung etiology. No desaturations with ambulation. The patient was started on ASA 81mg  daily. Follow up was arranged in the [facility]. We sent for records from [facility] [place], where  he had his stent placed, and will have these sent down to clinic when they arrive. He was medically stable and asymptomatic prior to discharge.   2. HTN - BP 140-180s/80-100s. Improved to 131/84 after starting Lisinopril 10mg  daily. Plan to titrate up BP meds in outpatient setting as needed.  3. Alcohol abuse - Patient told us he drinks 2 fifths of liquor per week. Alcohol level was negative. CIWAs have been 0. Patient reports last drink was evening of 1/26. We discharged him on a MV daily.  3. Tobacco abuse - Patient smokes half a pack of cigarettes per day. Nurse to provided smoking cessation education.  4. HLD - Patient has hyperlipidemia by lipid panel below. He merits a high-dose statin given his history of coronary artery disease and stent placement, however his finances will limit this. We discharged him on lovastatin 40mg  q1800 (on the [employer] list). Once he has an orange card, would recommend starting Lipitor   Lipid Panel    Component  Value  Date/Time    CHOL  231*  07/21/2013 2013    TRIG  134  07/21/2013 2013    HDL  60  07/21/2013 2013    CHOLHDL  3.9  07/21/2013 2013    VLDL  27  07/21/2013 2013    LDLCALC  144*  07/21/2013 2013    5. Health maintenance - Patient has not established medical care here and is uninsured and unemployed. A1C is 5.8. Hepatic functional panel wnl. Magnesium, phosphorus wnl. Patient can follow up in our clinic. I chose medications off the [employer] list. He needs a colonoscopy.   Discharge Vitals:   BP 131/84  Pulse 80  Temp(Src) 97.9 F (36.6 C) (Oral)  Resp 18  Ht 6\' 2"  (1.88 m)  Wt 190 lb 9.6 oz (86.455 kg)  BMI 24.46 kg/m2  SpO2 100%  Discharge Labs:  Results for orders placed during the hospital encounter of 07/21/13 (from the past 24 hour(s))  PRO B NATRIURETIC PEPTIDE     Status: None   Collection Time    07/21/13 12:52 PM      Result Value Range   Pro B Natriuretic peptide (BNP) 30.0  0 - 125 pg/mL  CBC     Status: None   Collection  Time    07/21/13  1:00 PM      Result Value Range   WBC 5.4  4.0 - 10.5 K/uL   RBC 4.93  4.22 - 5.81 MIL/uL   Hemoglobin 14.3  13.0 - 17.0 g/dL   HCT 42.4  39.0 - 52.0 %   MCV 86.0  78.0 - 100.0 fL   MCH 29.0  26.0 - 34.0 pg   MCHC 33.7  30.0 - 36.0 g/dL   RDW 14.3  11.5 - 15.5 %   Platelets 187  150 - 400 K/uL  BASIC METABOLIC PANEL     Status: Abnormal   Collection Time    07/21/13  1:00 PM      Result Value Range   Sodium 141  137 - 147 mEq/L   Potassium 4.7  3.7 - 5.3 mEq/L   Chloride 103  96 - 112 mEq/L   CO2 24  19 - 32 mEq/L   Glucose, Bld 97  70 - 99 mg/dL   BUN 12  6 - 23 mg/dL   Creatinine, Ser 7.56  0.50 - 1.35 mg/dL   Calcium 9.2  8.4 - 43.3 mg/dL   GFR calc non Af Amer 68 (*) >90 mL/min   GFR calc Af Amer 79 (*) >90 mL/min  POCT I-STAT TROPONIN I     Status: None   Collection Time    07/21/13  1:22 PM      Result Value Range   Troponin i, poc 0.01  0.00 - 0.08 ng/mL   Comment 3           INFLUENZA PANEL BY PCR (TYPE A & B, H1N1)     Status: None   Collection Time    07/21/13  6:33 PM      Result Value Range   Influenza A By PCR NEGATIVE  NEGATIVE   Influenza B By PCR NEGATIVE  NEGATIVE   H1N1 flu by pcr NOT DETECTED  NOT DETECTED  HEPATIC FUNCTION PANEL     Status: None   Collection Time    07/21/13  8:13 PM      Result Value Range   Total Protein 6.7  6.0 - 8.3 g/dL   Albumin 3.5  3.5 - 5.2 g/dL   AST 25  0 - 37 U/L   ALT 26  0 - 53 U/L   Alkaline Phosphatase 55  39 - 117 U/L   Total Bilirubin 0.3  0.3 - 1.2 mg/dL   Bilirubin, Direct <2.9  0.0 - 0.3 mg/dL   Indirect Bilirubin NOT CALCULATED  0.3 - 0.9 mg/dL  MAGNESIUM     Status: None   Collection Time    07/21/13  8:13 PM      Result Value Range   Magnesium 2.0  1.5 - 2.5 mg/dL  PHOSPHORUS     Status: None   Collection Time    07/21/13  8:13 PM      Result Value Range   Phosphorus 3.0  2.3 - 4.6 mg/dL  HEMOGLOBIN J1O     Status: Abnormal   Collection Time    07/21/13  8:13 PM      Result  Value Range   Hemoglobin A1C 5.8 (*) <5.7 %   Mean Plasma Glucose 120 (*) <117 mg/dL  TROPONIN I     Status: None   Collection Time    07/21/13  8:13 PM      Result Value Range   Troponin I <0.30  <0.30 ng/mL  LIPID PANEL     Status: Abnormal   Collection Time    07/21/13  8:13 PM      Result Value Range   Cholesterol 231 (*) 0 - 200 mg/dL   Triglycerides 841  <660 mg/dL   HDL 60  >63 mg/dL   Total CHOL/HDL Ratio 3.9     VLDL 27  0 - 40 mg/dL   LDL Cholesterol 016 (*) 0 - 99 mg/dL  ETHANOL     Status: None   Collection Time    07/21/13  8:13 PM      Result Value Range   Alcohol, Ethyl (B) <11  0 - 11 mg/dL  URINALYSIS, ROUTINE W REFLEX MICROSCOPIC     Status: Abnormal   Collection Time    07/21/13 11:39 PM  Result Value Range   Color, Urine YELLOW  YELLOW   APPearance CLEAR  CLEAR   Specific Gravity, Urine 1.029  1.005 - 1.030   pH 5.5  5.0 - 8.0   Glucose, UA NEGATIVE  NEGATIVE mg/dL   Hgb urine dipstick NEGATIVE  NEGATIVE   Bilirubin Urine NEGATIVE  NEGATIVE   Ketones, ur 15 (*) NEGATIVE mg/dL   Protein, ur NEGATIVE  NEGATIVE mg/dL   Urobilinogen, UA 0.2  0.0 - 1.0 mg/dL   Nitrite NEGATIVE  NEGATIVE   Leukocytes, UA NEGATIVE  NEGATIVE  URINE RAPID DRUG SCREEN (HOSP PERFORMED)     Status: None   Collection Time    07/21/13 11:39 PM      Result Value Range   Opiates NONE DETECTED  NONE DETECTED   Cocaine NONE DETECTED  NONE DETECTED   Benzodiazepines NONE DETECTED  NONE DETECTED   Amphetamines NONE DETECTED  NONE DETECTED   Tetrahydrocannabinol NONE DETECTED  NONE DETECTED   Barbiturates NONE DETECTED  NONE DETECTED  TROPONIN I     Status: None   Collection Time    07/22/13 12:54 AM      Result Value Range   Troponin I <0.30  <0.30 ng/mL  BASIC METABOLIC PANEL     Status: Abnormal   Collection Time    07/22/13  3:52 AM      Result Value Range   Sodium 142  137 - 147 mEq/L   Potassium 4.3  3.7 - 5.3 mEq/L   Chloride 104  96 - 112 mEq/L   CO2 23  19 - 32  mEq/L   Glucose, Bld 85  70 - 99 mg/dL   BUN 15  6 - 23 mg/dL   Creatinine, Ser 1.31  0.50 - 1.35 mg/dL   Calcium 8.9  8.4 - 10.5 mg/dL   GFR calc non Af Amer 60 (*) >90 mL/min   GFR calc Af Amer 69 (*) >90 mL/min  TROPONIN I     Status: None   Collection Time    07/22/13  8:10 AM      Result Value Range   Troponin I <0.30  <0.30 ng/mL    Signed: Lesly Dukes, MD 07/22/2013, 11:03 AM   Time Spent on Discharge: 35 minutes Services Ordered on Discharge: None Equipment Ordered on Discharge: None

## 2013-07-22 NOTE — Progress Notes (Signed)
Ambulated with patient around the unit @ 1000. He remained asymptomatic with O2 saturation of 99-100% and pulse rate 68-80  throughout.

## 2013-07-22 NOTE — Progress Notes (Signed)
  I have seen and examined the patient, and reviewed the daily progress note by Maryclare Bean, MS 3 and discussed the care of the patient with them. Please see my progress note from 07/22/2013 for further details regarding assessment and plan.    Signed:  Lesly Dukes, MD 07/22/2013, 12:01 PM

## 2013-07-22 NOTE — Progress Notes (Signed)
Subjective: Patient seen and examined at the bedside this morning. He says he feels great, back to baseline. Denies shortness of breath, chest pain. He has been walking about the room with no recurrent shortness of breath or dizziness. He is amenable to following up in our clinic. He is appreciative that we are trying to prescribe medicines off the [employer] list.  Objective: Vital signs in last 24 hours: Filed Vitals:   07/21/13 1855 07/21/13 2000 07/22/13 0000 07/22/13 0514  BP: 172/89 158/77 150/86 144/82  Pulse: 80 80 63 63  Temp:  98.7 F (37.1 C) 98.2 F (36.8 C) 98.1 F (36.7 C)  TempSrc:  Oral Oral Oral  Resp:  18 18 18   Height:      Weight:   190 lb 9.6 oz (86.455 kg)   SpO2: 98% 98% 100% 100%   Weight change:  No intake or output data in the 24 hours ending 07/22/13 0617  Physical Exam  Constitutional: He is oriented to person, place, and time and well-developed, well-nourished, and in no distress.  HENT:  Head: Normocephalic and atraumatic.  Eyes: Conjunctivae and EOM are normal. Pupils are equal, round, and reactive to light.  Neck: Normal range of motion. Neck supple. No JVD present.  Cardiovascular: Normal rate, regular rhythm, normal heart sounds and intact distal pulses. Exam reveals no gallop and no friction rub.  No murmur heard. No lower extremity edema.  Pulmonary/Chest: Effort normal and breath sounds normal. No respiratory distress. He has no wheezes. He has no rales. He exhibits no tenderness.  Abdominal: Soft. Bowel sounds are normal. He exhibits no distension. There is no tenderness.  Musculoskeletal: Normal range of motion. He exhibits no edema and no tenderness.  Neurological: He is alert and oriented to person, place, and time. GCS score is 15.  Skin: Skin is warm and dry. He is not diaphoretic.  Psychiatric:  Blunt/disinterested affect.   Lab Results: Basic Metabolic Panel:  Recent Labs Lab 07/21/13 1300 07/21/13 2013 07/22/13 0352  NA  141  --  142  K 4.7  --  4.3  CL 103  --  104  CO2 24  --  23  GLUCOSE 97  --  85  BUN 12  --  15  CREATININE 1.18  --  1.31  CALCIUM 9.2  --  8.9  MG  --  2.0  --   PHOS  --  3.0  --    Liver Function Tests:  Recent Labs Lab 07/21/13 2013  AST 25  ALT 26  ALKPHOS 55  BILITOT 0.3  PROT 6.7  ALBUMIN 3.5   No results found for this basename: LIPASE, AMYLASE,  in the last 168 hours No results found for this basename: AMMONIA,  in the last 168 hours CBC:  Recent Labs Lab 07/21/13 1300  WBC 5.4  HGB 14.3  HCT 42.4  MCV 86.0  PLT 187   Cardiac Enzymes:  Recent Labs Lab 07/21/13 2013 07/22/13 0054  TROPONINI <0.30 <0.30   BNP:  Recent Labs Lab 07/21/13 1252  PROBNP 30.0   D-Dimer: No results found for this basename: DDIMER,  in the last 168 hours CBG: No results found for this basename: GLUCAP,  in the last 168 hours Hemoglobin A1C:  Recent Labs Lab 07/21/13 2013  HGBA1C 5.8*   Fasting Lipid Panel:  Recent Labs Lab 07/21/13 2013  CHOL 231*  HDL 60  LDLCALC 144*  TRIG 134  CHOLHDL 3.9   Thyroid Function Tests: No  results found for this basename: TSH, T4TOTAL, FREET4, T3FREE, THYROIDAB,  in the last 168 hours Coagulation: No results found for this basename: LABPROT, INR,  in the last 168 hours Anemia Panel: No results found for this basename: VITAMINB12, FOLATE, FERRITIN, TIBC, IRON, RETICCTPCT,  in the last 168 hours Urine Drug Screen: Drugs of Abuse     Component Value Date/Time   LABOPIA NONE DETECTED 07/21/2013 [place] 07/21/2013 2339   LABBENZ NONE DETECTED 07/21/2013 2339   AMPHETMU NONE DETECTED 07/21/2013 2339   THCU NONE DETECTED 07/21/2013 2339   LABBARB NONE DETECTED 07/21/2013 2339    Alcohol Level:  Recent Labs Lab 07/21/13 2013  ETH <11   Urinalysis:  Recent Labs Lab 07/21/13 2339  COLORURINE YELLOW  LABSPEC 1.029  PHURINE 5.5  GLUCOSEU NEGATIVE  HGBUR NEGATIVE  BILIRUBINUR NEGATIVE    KETONESUR 15*  PROTEINUR NEGATIVE  UROBILINOGEN 0.2  NITRITE NEGATIVE  LEUKOCYTESUR NEGATIVE   Misc. Labs:   Micro Results: No results found for this or any previous visit (from the past 240 hour(s)). Studies/Results: Dg Chest 2 View  07/21/2013   CLINICAL DATA:  Short of breath.  Dizziness.  Lightheadedness.  EXAM: CHEST  2 VIEW  COMPARISON:  None.  FINDINGS: Cardiopericardial silhouette within normal limits. Mediastinal contours normal. Trachea midline. No airspace disease or effusion. Mild hyperinflation suggesting early emphysema.  IMPRESSION: No active cardiopulmonary disease.   Electronically Signed   By: Dereck Ligas M.D.   On: 07/21/2013 14:31   Medications: I have reviewed the patient's current medications. Scheduled Meds: . aspirin EC  81 mg Oral Daily  . enoxaparin (LOVENOX) injection  40 mg Subcutaneous Q24H  . folic acid  1 mg Oral Daily  . lisinopril  10 mg Oral Daily  . multivitamin with minerals  1 tablet Oral Daily  . nicotine  14 mg Transdermal Daily  . sodium chloride  3 mL Intravenous Q12H  . thiamine  100 mg Oral Daily   Or  . thiamine  100 mg Intravenous Daily   Continuous Infusions:  PRN Meds:.acetaminophen, acetaminophen, LORazepam, LORazepam  Assessment/Plan: [name] [name] is a 58 y.o. male from [place] with PMH HLD, CAD s/p LHC with stent placement in 2011, who presents to the ED with a chief complaint of shortness of breath with exertion.   #SOB (shortness of breath) on exertion - Symptoms have resolved. AVSS. Satting 97-100% on room air. 2D echo shows normal systolic function, EF 95-62%, no regional wall motion abnormalities, grade 1 diastolic dysfunction, normal PA pressure. Troponins negative x3. He continues to deny chest pain or pressure. Orthostatics negative (lying 168/89 PR 78, standing 155/105 PR 70). UA with 15 ketones. UDS/ethanol level negative. Etiology unclear but could be 2/2 undiagnosed COPD/asthma as patient is a smoker and CXR  shows hyperinflation. - Check pulse oximetry while ambulating  - Obtain records from [facility], [place] including prior EKG - Flu panel > In process - ASA 81mg  daily  - Tylenol 650 mg every 6 hours when necessary for pain  - Low sodium diet  - Recommend outpatient PFTs - Medically stable for discharge with close follow up in our clinic  #HTN - Currently 140-150s/80s. - Continue Lisinopril 10mg  daily  - Plan to titrate up BP meds in outpatient setting  #Alcohol abuse - Patient tells [place] his drinks 2 fifths of liquor per week. Alcohol level was negative. CIWAs have been 0. Patient reports last drink was evening of 1/26. - CIWA protocol  -  Thiamine 100mg  daily  - Folate 1mg  daily  - Multivitamin 1 tablet daily   #Tobacco abuse - Patient smokes half a pack of cigarettes per day.  - Nurse to provide smoking cessation education  - Patient refused Nicoderm patch 14mg  daily   #HLD - Patient has hyperlipidemia. He merits a high-dose statin given his history of coronary artery disease and stent placement. - Starting simvastatin 20mg  q1800 - Will discharge on lovastatin 40mg  q1800 (on the [employer] list) - Once he has an orange card, will recommend starting Lipitor  Lipid Panel     Component Value Date/Time   CHOL 231* 07/21/2013 2013   TRIG 134 07/21/2013 2013   HDL 60 07/21/2013 2013   CHOLHDL 3.9 07/21/2013 2013   VLDL 27 07/21/2013 2013   [place] 144* 07/21/2013 2013    #Health maintenance - Patient has not established medical care here and is uninsured. A1C is 5.8. Hepatic functional panel wnl. Magnesium, phosphorus wnl. - Patient can follow up in our clinic - Will choose medications off the [employer] list  #DVT PPX - Subcutaneous Lovenox and SCDs   Dispo: Disposition is deferred at this time, awaiting improvement of current medical problems.  Anticipated discharge in approximately 0-2 day(s).   The patient does not have a current PCP (No primary provider on file.) and does need  an [facility] hospital follow-up appointment after discharge.  The patient does not have transportation limitations that hinder transportation to clinic appointments.  .Services Needed at time of discharge: Y = Yes, Blank = No PT:   OT:   RN:   Equipment:   Other:     LOS: 1 day   Lesly Dukes, MD 07/22/2013, 6:17 AM  Lesly Dukes, MD  [email or web address] Pager # [phone] Office # [phone]

## 2013-07-22 NOTE — H&P (Signed)
INTERNAL MEDICINE TEACHING SERVICE Attending Admission Note  Date: 07/22/2013  Patient name: [name] [name]  Medical record number: [id number]  Date of birth: Sep 20, 1955    I have seen and evaluated [name] [name] and discussed their care with the Residency Team.  58 yr old man with pmhx HTN, HL, CAD s/p stent placement in 2001 presented with DOE. He states that he has gradually had DOE for weeks. He denies any CP during these episodes. He denies PND, orthopnea, LE edema, syncope, dizziness. He recently moved here from [place], does not have a PCP yet. He admits to smoking half a pack of cigarettes per day and is not quite ready to quit.  EKG reviewed is NSR. I do not see any ST changes.  There is no evidence of ACS (trop neg x 3). proBNP is 30. CXR shows some slight hyperinflation, no infiltrates. TTE has been performed and shows EF 00-92% grade 1 diastolic dysfunction. On exam, CV: S1S2, no m/r/g, RRR PULM: CTA bilat ABD/GI: Soft, NT, +BS, no distention, no mass. LE: no edema, 2/4 pulses.  He has been ambulated and oxygen saturation is 99-100% on RA. He denies any SOB. Encouraged tobacco cessation. He will need outpatient PFTs. Given his HTN, I agree with starting lisinopril. Will need repeat BMP as outpatient in 2-3 weeks. Given his hx of CAD and stent placement, agree with high dose statin. Will need outpatient follow up. He is medically stable for D/C. I suspect he has underlying COPD and needs outpatient management with PFTs to be performed first.  Dominic Pea, DO, [facility] 07/22/2013, 1:50 PM

## 2013-07-22 NOTE — Care Management Note (Signed)
    Page 1 of 1   07/22/2013     11:01:10 AM   CARE MANAGEMENT NOTE 07/22/2013  Patient:  [name] [name]   Account Number:  [id number]  Date Initiated:  07/22/2013  Documentation initiated by:  GRAVES-BIGELOW,Bertha Earwood  Subjective/Objective Assessment:   Pt admitted for SOB. Pt does not have PCP and no insurance.     Action/Plan:   Referral made to CM in ref to medication assistance. CM did speak to pt in regards to [facility] $4.00 med list. CM did call MD Cater and discussed with her. Pt will f/u in the clinic. CM did give pt orange card paperwork. No further needs.   Anticipated DC Date:  07/22/2013   Anticipated DC Plan:  [place]  CM consult  Medication Assistance      Choice offered to / List presented to:             Status of service:  Completed, signed off Medicare Important Message given?   (If response is "NO", the following Medicare IM given date fields will be blank) Date Medicare IM given:   Date Additional Medicare IM given:    Discharge Disposition:  HOME/SELF CARE  Per UR Regulation:  Reviewed for med. necessity/level of care/duration of stay  If discussed at [place] of Stay Meetings, dates discussed:    Comments:

## 2013-07-22 NOTE — Discharge Instructions (Signed)
It was a pleasure taking care of you. - Please follow up with your new doctor as scheduled above. The clinic is located in the basement of [facility]. They can set you up with an [employer] for medical assistance in [place]. - Your new PCP will order pulmonary function testing to evaluate for COPD vs. Asthma. - Please consider stopping smoking. I have attached some literature on tips for success. - We are prescribing several new medicines for you. These include aspirin daily for your heart, lisinopril daily for your blood pressure, and lovastatin daily for your high cholesterol. All of these medicines are available for $4 at your [place]. - Please call the clinic at [phone] if you develop recurrent weakness, numbness, shortness of breath, cough, chest pain.   Emergency Department Resource Guide 1) Find a Doctor and Pay Out of Pocket Although you won't have to find out who is covered by your insurance plan, it is a good idea to ask around and get recommendations. You will then need to call the office and see if the doctor you have chosen will accept you as a new patient and what types of options they offer for patients who are self-pay. Some doctors offer discounts or will set up payment plans for their patients who do not have insurance, but you will need to ask so you aren't surprised when you get to your appointment.  2) Contact Your Local Health Department Not all health departments have doctors that can see patients for sick visits, but many do, so it is worth a call to see if yours does. If you don't know where your local health department is, you can check in your phone book. The CDC also has a tool to help you locate your state's health department, and many state websites also have listings of all of their local health departments.  3) Find a [facility] If your illness is not likely to be very severe or complicated, you may want to try a walk in clinic. These are  popping up all over the country in pharmacies, drugstores, and shopping centers. They're usually staffed by nurse practitioners or physician assistants that have been trained to treat common illnesses and complaints. They're usually fairly quick and inexpensive. However, if you have serious medical issues or chronic medical problems, these are probably not your best option.  No Primary Care Doctor: - Call Health Connect at  [phone] - they can help you locate a primary care doctor that  accepts your insurance, provides certain services, etc. - Physician Referral Service- [phone]  Chronic Pain Problems: Organization         Address  Phone   Notes  [facility]  [phone] Patients need to be referred by their primary care doctor.   Medication Assistance: Organization         Address  Phone   Notes  [facility] Medication [facility] [address] [place], [place] [address] [phone] --Must be a resident of [facility] -- Must have NO insurance coverage whatsoever (no Medicaid/ Medicare, etc.) -- The pt. MUST have a primary care doctor that directs their care regularly and follows them in the community   MedAssist  [phone]   [employer]  [phone]    Agencies that provide inexpensive medical care: Organization         Address  Phone   Notes  [place]  [phone]  [facility]    [phone]   [facility] [place], [place] [address] [phone]   [place]. [address], [place] [phone]   Planned Parenthood    [phone]   [facility]    [phone]   [place] and [address], [place] Phone:  [phone], Fax:  [phone] Hours of Operation:  9 am - 6 pm, M-F.  Also accepts Medicaid/Medicare and self-pay.  [facility] for [place] [address], [address], [place] Phone: [phone], Fax: [phone]. Hours of Operation:  8:30 am - 5:30 pm, M-F.  Also accepts Medicaid and self-pay.  [facility] [place] [address] [place] Phone: [phone]   [place], [place], [place] [phone], Ext. [phone] Mondays & Thursdays: 7-9 AM.  First 15 patients are seen on a first come, first serve basis.    [place] Providers:  Organization         Address  Phone   Notes  [facility] [address],  [phone] Also accepts self-pay patients.  [facility] [address], [place]  [phone]   [place], [address], [place] [phone]   [facility] [address] [place] [phone]   Lucianne Lei [address], [place]   [phone] Only accepts [place] Access [place] patients after they have their name applied to their card.   Self-Pay (no insurance) in [facility]:  Organization         Address  Phone   Notes  Sickle Cell Patients, [facility] [place] [phone]   [facility] [place] [phone]   [facility] [place]  [place], [place], [place] [phone]   [facility]/Dr. Osei-Bonsu  [address], [place] or [address], [place] [phone] Phone number for both [place] and [place] locations is the same.  Urgent [facility] and [facility] [address] [place] [phone]   [facility] [address] [place] or [address] [phone]   [facility] [address] [place] [phone], phone; [phone], fax Sees patients 1st and 3rd Saturday  of every month.  Must not qualify for public or private insurance (i.e. Medicaid, Medicare, [place] Health Choice, Veterans' Benefits)  Household income should be no more than 200% of the poverty level The clinic cannot treat you if you are pregnant or think you are pregnant  Sexually transmitted diseases are not treated at the clinic.    Dental Care: Organization         Address  Phone  Notes  [facility] Department of [facility] [place] [phone] Accepts children up to age 64 who are enrolled in [place] or [place]; pregnant women with a Medicaid card; and children who have applied for Medicaid or [place] Health Choice, but were declined, whose parents can pay a reduced fee at time of service.  [facility] Department of [facility]  [address],  [place] [phone] Accepts children up to age 79 who are enrolled in Medicaid or [place] Health Choice; pregnant women with a Medicaid card; and children who have applied for Medicaid or [place] Health Choice, but were declined, whose parents can pay a reduced fee at time of service.  [facility] Adult [facility] Access PROGRAM  [place] [phone] Patients are seen by appointment only. Walk-ins are not accepted. [place] will see patients 63 years of age and older. Monday - Tuesday (8am-5pm) Most Wednesdays (8:30-5pm) $30 per visit, cash only  [facility] Adult Dental Access PROGRAM  [address], [facility] [phone] Patients are seen by appointment only. Walk-ins are not accepted. [place] will see patients 68 years of age and older. One Wednesday Evening (Monthly: Volunteer Based).  $30 per visit, cash only  [place]  [phone] for adults; Children under age 14, call Graduate Pediatric Dentistry at [phone]. Children aged 27-14, please call [phone] to request a pediatric application.   Dental services are provided in all areas of dental care including fillings, crowns and bridges, complete and partial dentures, implants, gum treatment, root canals, and extractions. Preventive care is also provided. Treatment is provided to both adults and children. Patients are selected via a lottery and there is often a waiting list.   [facility] [address], [place]  [phone] [email or web address]   [facility] [address] [place], [place] [phone], Ext. [phone] Second and Fourth Thursday of each month, opens at 6:30 AM; Clinic ends at 9 AM.  Patients are seen on a first-come first-served basis, and a limited number are seen during each clinic.   [facility]  [address] [place] Danker [place], [place] [phone]   Eligibility Requirements You must have lived in [place], [place], or [place] counties for at least the last three months.   You cannot be eligible for state or federal sponsored [employer], including [employer], [place], or [employer].   You generally cannot be eligible for healthcare insurance through your employer.    How to apply: Eligibility screenings are held every Tuesday and Wednesday afternoon from 1:00 pm until 4:00 pm. You do not need an appointment for the interview!  [facility] [address], [place], [place]   [place]  [place] Department  [place]  [phone]    Behavioral Health Resources in the Community: Intensive Outpatient Programs Organization         Address  Phone  Notes  [place]. [address] [place], [place] [phone]   [facility] Outpatient [address], [place], [place]   ADS: Alcohol & Drug Svcs [address] [place], [place]   [place] [address]  [place], [place] or [phone]   Substance Abuse Resources Organization         Address  Phone  Notes  Alcohol and Drug Services  [phone]   [place]  [phone]   [facility] [place]   Chinita Pester  [phone]   Residential & Outpatient Substance Abuse Program  [phone]   Psychological Services Organization         Address  Phone  Notes  [place]  Portal  336-  [place] [address] [place] or [phone]    Mobile Crisis Teams Organization         Address  Phone  Notes  [facility], Mobile Crisis Care Unit  [phone]   [facility]  [address] [place], [place]   [place] Levels [address] [place] [phone]    Self-Help/Support Groups Organization         Address  Phone             Notes  [place]. of [place] - variety of support groups  [place] Call for more information  Narcotics Anonymous (NA), [facility] [address], [employer] [place]  2 meetings at this location   Special educational needs teacher         Address  Phone  Notes  ASAP Residential Treatment [place],    [place]  [phone]   [facility]  [address] [place] [phone], [place], [place]   [place], [place] [phone] Admissions: 8am-3pm M-F  Incentives Substance [place] [address],    [place], [place] [phone]   [facility] [address] [place], [place], [place]   [facility] [address]  [place], [place]   [facility] - Intensive Outpatient [address] Kristeen Mans [address], [place], [place]   [facility] ([place].) [address].,  [place], [place]  [phone] or [phone]   [facility]idential Treatment Services (RTS) [address], [place], [place] Accepts Medicaid  [facility] [address],  [place] [phone] Substance Abuse/Addiction Treatment   [facility] Organization         Address  Phone  Notes  [facility]  [phone]   Domenic Schwab, PhD [address] Arlis Porta [place], [place]   [phone] or [phone]   [place], [place] [phone]   [facility] [address], [place], [place] [phone] Insurance/Medicaid/sponsorship through [facility] and Families [address], [address] [place]                                    [place], [place] [phone] [place] [address], [place] [phone]    Dr. Adele Schilder  [phone]   [facility] [place] Dept. 1) [address] [place] 2) [place] 3)  [place] [address], [place] [phone]  [phone]   [place] [phone] or [phone] (After Hours)

## 2013-07-22 NOTE — Progress Notes (Signed)
Subjective: The patient reports feeling much better today with no episodes of shortness of breath or dizziness since admission. He continues to deny any chest pain, chest pressure, palpitations, difficulty breathing, or SOB with ambulation. He denies headache, visual changes, or any other concerns or complaints at present. The results of his 2D Echo were discussed. He is amenable to discharge home and to follow-up with our clinic. He believes he will be able to be medication compliant if we continue to select his medications from the [facility] $4 list.  Objective: Vital signs in last 24 hours: Filed Vitals:   07/22/13 0000 07/22/13 0500 07/22/13 0514 07/22/13 0842  BP: 150/86  144/82 131/84  Pulse: 63  63 80  Temp: 98.2 F (36.8 C)  98.1 F (36.7 C) 97.9 F (36.6 C)  TempSrc: Oral  Oral Oral  Resp: 18  18 18   Height:      Weight: 86.455 kg (190 lb 9.6 oz) 86.455 kg (190 lb 9.6 oz)    SpO2: 100%  100% 100%   Weight change:   Intake/Output Summary (Last 24 hours) at 07/22/13 1030 Last data filed at 07/22/13 0841  Gross per 24 hour  Intake    120 ml  Output      0 ml  Net    120 ml    ROS: See HPI. Otherwise negative.    Physical Exam Filed Vitals:   07/22/13 0842  BP: 131/84  Pulse: 80  Temp: 97.9 F (36.6 C)  Resp: 18  SpO2 100% on RA  General: Alert, well-appearing, NAD. Appears stated age.  HEENT: NCAT, PERRLA, conjunctiva anicteric and without injection. Nares wnl and without discharge or erythema. Neck supple and without LAD. No tenderness to palpation.  Pulmonary: CTAB, no incr WOB. No chest wall tenderness or deformity.  Cardiovascular: RRR, S1 and S2 normal, no murmur, rub, or gallop appreciated. All four extremities warm, well-perfused, without cyanosis or edema. 2+ radial pulses bilaterally.  Abdomen: Bowel sounds active. Soft, NTND.  Skin: Skin is warm and dry. He is not diaphoretic.  Neurologic/MSK: Alert and oriented x 3. GCS score is 15. Sensation intact  throughout to light touch and pressure. No lower extremity tenderness to palpation.  Psychiatric: Improved affect today, smiling and interacting appropriately during interview.  Lab Results:  Recent Labs Lab 07/21/13 1300 07/22/13 0352  NA 141 142  K 4.7 4.3  CL 103 104  CO2 24 23  GLUCOSE 97 85  BUN 12 15  CREATININE 1.18 1.31  CALCIUM 9.2 8.9  GFRNONAA 68* 60*  GFRAA 79* 69*     Recent Labs Lab 07/21/13 1300  WBC 5.4  HGB 14.3  HCT 42.4  PLT 187    Micro Results: No results found for this or any previous visit (from the past 240 hour(s)).  Studies/Results: Dg Chest 2 View  07/21/2013   CLINICAL DATA:  Short of breath.  Dizziness.  Lightheadedness.  EXAM: CHEST  2 VIEW  COMPARISON:  None.  FINDINGS: Cardiopericardial silhouette within normal limits. Mediastinal contours normal. Trachea midline. No airspace disease or effusion. Mild hyperinflation suggesting early emphysema.  IMPRESSION: No active cardiopulmonary disease.   Electronically Signed   By: Dereck Ligas M.D.   On: 07/21/2013 14:31    Echo: Left ventricle: The cavity size was normal. Systolic function was normal. The estimated ejection fraction was in the range of 60% to 65%. Wall motion was normal; there were no regional wall motion abnormalities. Doppler parameters are consistent with abnormal left  ventricular relaxation (grade 1 diastolic dysfunction). - Aortic valve: Trileaflet; normal thickness leaflets. No regurgitation. - Mitral valve: Structurally normal valve. Mild regurgitation. - Left atrium: The atrium was normal in size. - Right ventricle: The cavity size was normal. Wall thickness was normal. Systolic function was normal. - Right atrium: The atrium was normal in size. - Tricuspid valve: No regurgitation. - Pulmonary arteries: Systolic pressure was within the normal range. - Pericardium, extracardiac: There was no pericardial effusion.  EKG on admission: There are no previous tracings  available for comparison. NSR with rate of 88. Right atrial enlargement with possible right heart strain pattern (W1U2V2). T wave inversions present in Lead III only with no other ST or T wave abnormalities noted. QTc of 425.  Medications: I have reviewed the patient's current medications. Scheduled Meds: . aspirin EC  81 mg Oral Daily  . enoxaparin (LOVENOX) injection  40 mg Subcutaneous Q24H  . folic acid  1 mg Oral Daily  . lisinopril  10 mg Oral Daily  . multivitamin with minerals  1 tablet Oral Daily  . nicotine  14 mg Transdermal Daily  . simvastatin  20 mg Oral q1800  . sodium chloride  3 mL Intravenous Q12H  . thiamine  100 mg Oral Daily   Or  . thiamine  100 mg Intravenous Daily   Continuous Infusions:  PRN Meds:.acetaminophen, acetaminophen, LORazepam, LORazepam   Assessment/Plan: Principal Problem:   SOB (shortness of breath) on exertion Active Problems:   Dizziness   HTN (hypertension)   Tobacco abuse   Alcohol abuse   CAD (coronary artery disease)   H/O medication noncompliance   HLD (hyperlipidemia)  Mr. [name] [name] is a 58yo male with PMH of CAD s/p stent in 2011, HTN, and HLD who presents with worsening SOB with exertion and lightheadedness for the last 10 days.   # SOB on exertion and lightheadedness, likely due to emphysema/COPD- Symptoms have resolved. Telemetry unremarkable. O2 sats 97%+ on RA. No desaturation with ambulation (O2 sat 99%-100% on RA throughout). 2D Echo showed Grade 1 diastolic dysfunction, LVEF 60-65%, no RA or RV thickening, normal pulmonary artery pressure. Troponins negative x 3. BMP from this morning still wnl. Mg wnl at 2.0, Phos wnl at 3.0. U/A remarkable for +ketones of 15. Flu panel negative. -Discontinue pulse-ox and telemetry -Will defer stress test given very low likelihood of ACS and cardiac etiology of SOB -Will recommend PFTs as outpatient -Continue ASA 81 mg daily  -Continue Tylenol 650 mg q 6 hrs prn pain  -Requested prior  medical records from [place]   # Uncontrolled HTN - Patient is asymptomatic. BPs elevated to (131-180)/(77-108). Repeat BMP wnl. -Continue Lisinopril 10mg  daily   # Hyperlipidemia - Patient reports history of HLD but no lipid panel on record. Lipid Panel on admission with total cholesterol 231, HDL 60, LDL 144. 10 year ASCVD risk of 22%. -Simvastatin 20mg  daily  # Tobacco abuse - Patient reports smoking 1/2 ppd for the last 30 years. No desaturation with ambulation, no supplemental oxygen needed. Will recommend outpatient PFTs as outpatient. -Nicotine patch offered but patient declined  -Smoking cessation counseling prior to discharge   # Alcohol abuse - Patient reports drinking alcohol daily, consuming 2 fifths per week (~1536mL total) with his last drink occurring yesterday. He denies prior withdrawal symptoms including seizures. CIWA scores of 0. -CIWA protocol to monitor withdrawal symptoms  -Ethyl alcohol and UDS negative -Thiamine 100 mg daily  -Folate 1 mg daily  -Multivitamin 1 tablet daily   #  Health maintenance - Patient unemployed and uninsured. He is agreeable to being seen in our clinic. - Hgb A1c 5.8% - LFTs wnl - Lipid panel abnormal (see above) - SW and Care Mgmt consulted - ok to discharge even if cannot be seen by SW and CM due to f/u with our clinic   DVT PPx: Lovenox 40mg  subQ daily and SCDs   Dispo: Disposition is deferred at this time; awaiting improvement of current medical problems. Likely discharge today.   The patient does have a current PCP (No primary provider on file.) and does need an [facility] hospital follow-up appointment after discharge.   The patient does not have transportation limitations that hinder transportation to clinic appointments.   Contacts/follow up: Follow-up Information   Follow up with LI, NA, MD On 07/30/2013. (at [facility])    Specialty:  Internal Medicine   Contact information:   [place] [address] [phone]          This is a Medical Student Note.  The care of the patient was discussed with Dr. Stann Mainland and the assessment and plan formulated with their assistance.  Please see their attached note for official documentation of the daily encounter.   LOS: 1 day   Maryclare Bean, Med Student Pager [phone] 07/22/2013, 10:30 AM

## 2013-07-22 NOTE — Progress Notes (Signed)
  I have seen and examined the patient, and reviewed the daily progress note by Maryclare Bean, MS 3 and discussed the care of the patient with them. Please see my progress note from 07/22/2013 for further details regarding assessment and plan.    Signed:  Lesly Dukes, MD 07/22/2013, 12:02 PM

## 2013-07-22 NOTE — Progress Notes (Signed)
Pt has been refusing his SCD.----Yanci Bachtell, rn

## 2013-07-24 NOTE — Discharge Summary (Signed)
  Date: 07/24/2013  Patient name: [name] [name]  Medical record number: [id number]  Date of birth: [birth year]   This patient has been seen and the plan of care was discussed with the house staff. Please see their note for complete details. I concur with their findings and plan.  Dominic Pea, DO, [place] [facility] 07/24/2013, 5:05 PM

## 2013-07-25 NOTE — ED Provider Notes (Signed)
Medical screening examination/treatment/procedure(s) were performed by non-physician practitioner and as supervising physician I was immediately available for consultation/collaboration.      Merryl Hacker, MD 07/25/13 [phone]

## 2013-07-30 ENCOUNTER — Ambulatory Visit: Payer: Self-pay | Admitting: Internal Medicine

## 2013-10-29 ENCOUNTER — Encounter (HOSPITAL_COMMUNITY): Payer: Self-pay | Admitting: Emergency Medicine

## 2013-10-29 ENCOUNTER — Emergency Department (INDEPENDENT_AMBULATORY_CARE_PROVIDER_SITE_OTHER)
Admission: EM | Admit: 2013-10-29 | Discharge: 2013-10-29 | Disposition: A | Discharge status: Home / Self Care | Payer: Self-pay | Source: Home / Self Care | Attending: Family Medicine | Admitting: Family Medicine

## 2013-10-29 DIAGNOSIS — I10 Essential (primary) hypertension: Secondary | ICD-10-CM

## 2013-10-29 DIAGNOSIS — Z9114 Patient's other noncompliance with medication regimen: Secondary | ICD-10-CM

## 2013-10-29 DIAGNOSIS — Z9119 Patient's noncompliance with other medical treatment and regimen: Secondary | ICD-10-CM

## 2013-10-29 DIAGNOSIS — Z91199 Patient's noncompliance with other medical treatment and regimen due to unspecified reason: Secondary | ICD-10-CM

## 2013-10-29 NOTE — ED Provider Notes (Signed)
CSN: [id number]     Arrival date & time 10/29/13  2595 History   First MD Initiated Contact with Patient 10/29/13 [phone]     Chief Complaint  Patient presents with  . Hypertension   (Consider location/radiation/quality/duration/timing/severity/associated sxs/prior Treatment) HPI Comments: 58 year old male presents for evaluation of high blood pressure. He has had high blood pressure for many years. He is often noncompliant with his medications. After a hospital stay for chest pain 3 months ago, he was discharged with a years supply of 10 mg lisinopril. He took this for one month and then stopped. He went to donate plasma today and his blood pressure was noted to be high. He was told to come here for evaluation and treatment of his hypertension. He did not consider going to get any of his refills of his blood pressure medication and start taking it. Denies any symptoms at this time.   Past Medical History  Diagnosis Date  . High cholesterol   . Coronary artery disease     s/p stent   . HTN (hypertension)   . Smoker     1/2 ppd x >30 years    Past Surgical History  Procedure Laterality Date  . Coronary angioplasty with stent placement Left 2011   Family History  Problem Relation Age of Onset  . Hypertension      grandparents    History  Substance Use Topics  . Smoking status: Current Every Day Smoker -- 0.50 packs/day for 30 years  . Smokeless tobacco: Current User  . Alcohol Use: 3.0 oz/week    1 Glasses of wine, 4 Cans of beer per week     Comment: Drinks 2 fifths in one week (total of ~1591mL)    Review of Systems  Constitutional: Negative for fever, chills and fatigue.  HENT: Negative for sore throat.   Eyes: Negative for visual disturbance.  Respiratory: Negative for cough and shortness of breath.   Cardiovascular: Negative for chest pain, palpitations and leg swelling.  Gastrointestinal: Negative for nausea, vomiting, abdominal pain, diarrhea and constipation.   Genitourinary: Negative for dysuria, urgency, frequency, hematuria and decreased urine volume.  Musculoskeletal: Negative for arthralgias, myalgias, neck pain and neck stiffness.  Skin: Negative for rash.  Neurological: Negative for dizziness, weakness and light-headedness.  All other systems reviewed and are negative.   Allergies  Review of patient's allergies indicates no known allergies.  Home Medications   Prior to Admission medications   Medication Sig Start Date End Date Taking? Authorizing Provider  aspirin 81 MG EC tablet Take 1 tablet (81 mg total) by mouth daily. 07/22/13   Lesly Dukes, MD  lisinopril (PRINIVIL,ZESTRIL) 10 MG tablet Take 1 tablet (10 mg total) by mouth daily. 07/22/13   Lesly Dukes, MD  lovastatin (MEVACOR) 20 MG tablet Take 2 tablets (40 mg total) by mouth at bedtime. 07/22/13   Lesly Dukes, MD  Multiple Vitamin (MULTIVITAMIN WITH MINERALS) TABS tablet Take 1 tablet by mouth daily. 07/22/13   Lesly Dukes, MD   BP 186/95  Pulse 80  Temp(Src) 98.6 F (37 C) (Oral)  Resp 16  SpO2 99% Physical Exam  Nursing note and vitals reviewed. Constitutional: He is oriented to person, place, and time. He appears well-developed and well-nourished. No distress.  HENT:  Head: Normocephalic.  Pulmonary/Chest: Effort normal. No respiratory distress.  Neurological: He is alert and oriented to person, place, and time. Coordination normal.  Skin: Skin is warm and dry. No rash noted. He is not diaphoretic.  Psychiatric:  He has a normal mood and affect. Judgment normal.    ED Course  Procedures (including critical care time) Labs Review Labs Reviewed - No data to display  Imaging Review No results found.   MDM   1. Hypertension   2. Noncompliance with medications    I've advised him to get his blood pressure medicine from the pharmacy and start taking it. He will followup as needed. I have also encouraged him to go get his orange card which he was told to do after  he left the hospital because he has not pursued this either.       Liam Graham, PA-C 10/29/13 1241

## 2013-10-29 NOTE — Discharge Instructions (Signed)

## 2013-10-29 NOTE — ED Notes (Signed)
States was sent from plasma clinic to have BP checked.   Pt states that he has been with out BP med since February.

## 2013-10-30 NOTE — ED Provider Notes (Signed)
Medical screening examination/treatment/procedure(s) were performed by a resident physician or non-physician practitioner and as the supervising physician I was immediately available for consultation/collaboration.  Lynne Leader, MD    Gregor Hams, MD 10/30/13 [phone]

## 2014-12-25 ENCOUNTER — Encounter (HOSPITAL_COMMUNITY): Payer: Self-pay | Admitting: *Deleted

## 2014-12-25 ENCOUNTER — Emergency Department (HOSPITAL_COMMUNITY)
Admission: EM | Admit: 2014-12-25 | Discharge: 2014-12-25 | Discharge status: Against Medical Advice | Payer: Self-pay | Attending: Emergency Medicine | Admitting: Emergency Medicine

## 2014-12-25 DIAGNOSIS — I10 Essential (primary) hypertension: Secondary | ICD-10-CM | POA: Insufficient documentation

## 2014-12-25 DIAGNOSIS — R109 Unspecified abdominal pain: Secondary | ICD-10-CM | POA: Insufficient documentation

## 2014-12-25 DIAGNOSIS — I2510 Atherosclerotic heart disease of native coronary artery without angina pectoris: Secondary | ICD-10-CM | POA: Insufficient documentation

## 2014-12-25 DIAGNOSIS — Z720 Tobacco use: Secondary | ICD-10-CM | POA: Insufficient documentation

## 2014-12-25 NOTE — ED Notes (Signed)
Pt c/o R groin pain x 1 week.  States pain increases when he walks or lifts things.  Unsure if he has a hernia.

## 2014-12-25 NOTE — ED Notes (Signed)
Pt called from room . Pt reports he is going to leave and come back if pain is worse. Pt was informed the room in Pod A was almost  ready.

## 2014-12-25 NOTE — ED Notes (Signed)
updated Pt room will be A-10 .

## 2014-12-25 NOTE — ED Notes (Signed)
Pt reports he will come back if pain is worse.

## 2014-12-25 NOTE — ED Notes (Signed)
Pt up dated on waiting for transfere to another room.

## 2015-06-15 ENCOUNTER — Encounter (HOSPITAL_COMMUNITY): Payer: Self-pay | Admitting: *Deleted

## 2015-06-15 ENCOUNTER — Emergency Department (HOSPITAL_COMMUNITY)
Admission: EM | Admit: 2015-06-15 | Discharge: 2015-06-15 | Disposition: A | Discharge status: Home / Self Care | Payer: Self-pay | Attending: Emergency Medicine | Admitting: Emergency Medicine

## 2015-06-15 DIAGNOSIS — K625 Hemorrhage of anus and rectum: Secondary | ICD-10-CM

## 2015-06-15 DIAGNOSIS — I10 Essential (primary) hypertension: Secondary | ICD-10-CM | POA: Insufficient documentation

## 2015-06-15 DIAGNOSIS — K644 Residual hemorrhoidal skin tags: Secondary | ICD-10-CM | POA: Insufficient documentation

## 2015-06-15 DIAGNOSIS — K649 Unspecified hemorrhoids: Secondary | ICD-10-CM

## 2015-06-15 DIAGNOSIS — I2510 Atherosclerotic heart disease of native coronary artery without angina pectoris: Secondary | ICD-10-CM | POA: Insufficient documentation

## 2015-06-15 DIAGNOSIS — F17210 Nicotine dependence, cigarettes, uncomplicated: Secondary | ICD-10-CM | POA: Insufficient documentation

## 2015-06-15 DIAGNOSIS — E782 Mixed hyperlipidemia: Secondary | ICD-10-CM | POA: Insufficient documentation

## 2015-06-15 LAB — I-STAT CHEM 8, ED
BUN: 18 mg/dL (ref 6–20)
Calcium, Ion: 1.2 mmol/L (ref 1.12–1.23)
Chloride: 102 mmol/L (ref 101–111)
Creatinine, Ser: 1.1 mg/dL (ref 0.61–1.24)
Glucose, Bld: 102 mg/dL — ABNORMAL HIGH (ref 65–99)
HEMATOCRIT: 45 % (ref 39.0–52.0)
HEMOGLOBIN: 15.3 g/dL (ref 13.0–17.0)
POTASSIUM: 4.6 mmol/L (ref 3.5–5.1)
Sodium: 140 mmol/L (ref 135–145)
TCO2: 28 mmol/L (ref 0–100)

## 2015-06-15 LAB — POC OCCULT BLOOD, ED: FECAL OCCULT BLD: NEGATIVE

## 2015-06-15 NOTE — ED Provider Notes (Signed)
CSN: [id number]     Arrival date & time 06/15/15  0848 History   First MD Initiated Contact with Patient 06/15/15 0913     Chief Complaint  Patient presents with  . Rectal Bleeding     (Consider location/radiation/quality/duration/timing/severity/associated sxs/prior Treatment) HPI Comments: Off and on over last year Yesterday bright red, saw two clots Thought needed to have BM but just had two small blood clots No n/v/diarhea Hx of hemorrhoids in the past No blood thinners    Patient is a 59 y.o. male presenting with hematochezia.  Rectal Bleeding Associated symptoms: no abdominal pain, no fever and no vomiting     Past Medical History  Diagnosis Date  . High cholesterol   . Coronary artery disease     s/p stent   . HTN (hypertension)   . Smoker     1/2 ppd x >30 years    Past Surgical History  Procedure Laterality Date  . Coronary angioplasty with stent placement Left 2011   Family History  Problem Relation Age of Onset  . Hypertension      grandparents    Social History  Substance Use Topics  . Smoking status: Current Every Day Smoker -- 0.50 packs/day for 30 years  . Smokeless tobacco: Current User  . Alcohol Use: 3.0 oz/week    1 Glasses of wine, 4 Cans of beer per week     Comment: Drinks 2 fifths in one week (total of ~1554mL)    Review of Systems  Constitutional: Negative for fever.  HENT: Negative for sore throat.   Eyes: Negative for visual disturbance.  Respiratory: Negative for shortness of breath.   Cardiovascular: Negative for chest pain.  Gastrointestinal: Positive for blood in stool, hematochezia and anal bleeding. Negative for nausea, vomiting, abdominal pain, diarrhea and constipation.  Genitourinary: Negative for difficulty urinating.  Musculoskeletal: Negative for back pain and neck stiffness.  Skin: Negative for rash.  Neurological: Negative for syncope and headaches.      Allergies  Review of patient's allergies indicates no  known allergies.  Home Medications   Prior to Admission medications   Medication Sig Start Date End Date Taking? Authorizing Provider  aspirin 81 MG EC tablet Take 1 tablet (81 mg total) by mouth daily. 07/22/13   Pollie Friar, MD  lisinopril (PRINIVIL,ZESTRIL) 10 MG tablet Take 1 tablet (10 mg total) by mouth daily. 07/22/13   Pollie Friar, MD  lovastatin (MEVACOR) 20 MG tablet Take 2 tablets (40 mg total) by mouth at bedtime. 07/22/13   Pollie Friar, MD  Multiple Vitamin (MULTIVITAMIN WITH MINERALS) TABS tablet Take 1 tablet by mouth daily. 07/22/13   Pollie Friar, MD   BP 158/88 mmHg  Pulse 65  Temp(Src) 97.9 F (36.6 C) (Oral)  Resp 20  SpO2 97% Physical Exam  Constitutional: He is oriented to person, place, and time. He appears well-developed and well-nourished. No distress.  HENT:  Head: Normocephalic and atraumatic.  Eyes: Conjunctivae and EOM are normal.  Neck: Normal range of motion.  Cardiovascular: Normal rate, regular rhythm, normal heart sounds and intact distal pulses.  Exam reveals no gallop and no friction rub.   No murmur heard. Pulmonary/Chest: Effort normal and breath sounds normal. No respiratory distress. He has no wheezes. He has no rales.  Abdominal: Soft. He exhibits no distension. There is no tenderness. There is no guarding.  Genitourinary: Rectal exam shows external hemorrhoid (signs of upervious hemorrhoid, no current swelling). Rectal exam shows anal tone  normal. Guaiac negative stool.  Musculoskeletal: He exhibits no edema.  Neurological: He is alert and oriented to person, place, and time.  Skin: Skin is warm and dry. He is not diaphoretic.  Nursing note and vitals reviewed.   ED Course  Procedures (including critical care time) Labs Review Labs Reviewed  I-STAT CHEM 8, ED - Abnormal; Notable for the following:    Glucose, Bld 102 (*)    All other components within normal limits  POC OCCULT BLOOD, ED    Imaging Review No results found. I  have personally reviewed and evaluated these images and lab results as part of my medical decision-making.   EKG Interpretation None      MDM   Final diagnoses:  Rectal bleeding  Hemorrhoids, unspecified hemorrhoid type   59yo male with hypercholesterolemia, CAD, htn, presents with concern for rectal bleeding.  Patient hemodynamically stable, hemoglobin at baseline. Amt of bleeding per hx not consistent with diverticulosis. AVM.PUD.  Hx most consistent with hemorrhoids. Have low suspicion for serious GI bleed by history, physical exam and labwork. Recommend GI and PCP follow up. Discussed reasons to return. Patient discharged in stable condition with understanding of reasons to return.     Gareth Morgan, MD 06/16/15 [phone]

## 2015-06-15 NOTE — ED Notes (Signed)
Pt reports blood in stool for over 1 week. Pt states that it is bright red and only with BMs. Ppt reports hx of hemhoroids in the past.

## 2015-06-15 NOTE — Discharge Instructions (Signed)
Gastrointestinal Bleeding Gastrointestinal (GI) bleeding means there is bleeding somewhere along the digestive tract, between the mouth and anus. CAUSES  There are many different problems that can cause GI bleeding. Possible causes include:  Esophagitis. This is inflammation, irritation, or swelling of the esophagus.  Hemorrhoids.These are veins that are full of blood (engorged) in the rectum. They cause pain, inflammation, and may bleed.  Anal fissures.These are areas of painful tearing which may bleed. They are often caused by passing hard stool.  Diverticulosis.These are pouches that form on the colon over time, with age, and may bleed significantly.  Diverticulitis.This is inflammation in areas with diverticulosis. It can cause pain, fever, and bloody stools, although bleeding is rare.  Polyps and cancer. Colon cancer often starts out as precancerous polyps.  Gastritis and ulcers.Bleeding from the upper gastrointestinal tract (near the stomach) may travel through the intestines and produce black, sometimes tarry, often bad smelling stools. In certain cases, if the bleeding is fast enough, the stools may not be black, but red. This condition may be life-threatening. SYMPTOMS   Vomiting bright red blood or material that looks like coffee grounds.  Bloody, black, or tarry stools. DIAGNOSIS  Your caregiver may diagnose your condition by taking your history and performing a physical exam. More tests may be needed, including:  X-rays and other imaging tests.  Esophagogastroduodenoscopy (EGD). This test uses a flexible, lighted tube to look at your esophagus, stomach, and small intestine.  Colonoscopy. This test uses a flexible, lighted tube to look at your colon. TREATMENT  Treatment depends on the cause of your bleeding.   For bleeding from the esophagus, stomach, small intestine, or colon, the caregiver doing your EGD or colonoscopy may be able to stop the bleeding as part of  the procedure.  Inflammation or infection of the colon can be treated with medicines.  Many rectal problems can be treated with creams, suppositories, or warm baths.  Surgery is sometimes needed.  Blood transfusions are sometimes needed if you have lost a lot of blood. If bleeding is slow, you may be allowed to go home. If there is a lot of bleeding, you will need to stay in the hospital for observation. HOME CARE INSTRUCTIONS   Take any medicines exactly as prescribed.  Keep your stools soft by eating foods that are high in fiber. These foods include whole grains, legumes, fruits, and vegetables. Prunes (1 to 3 a day) work well for many people.  Drink enough fluids to keep your urine clear or pale yellow. SEEK IMMEDIATE MEDICAL CARE IF:   Your bleeding increases.  You feel lightheaded, weak, or you faint.  You have severe cramps in your back or abdomen.  You pass large blood clots in your stool.  Your problems are getting worse. MAKE SURE YOU:   Understand these instructions.  Will watch your condition.  Will get help right away if you are not doing well or get worse.   This information is not intended to replace advice given to you by your health care provider. Make sure you discuss any questions you have with your health care provider.   Document Released: 06/08/2000 Document Revised: 05/28/2012 Document Reviewed: 11/29/2014 Elsevier Interactive Patient Education 2016 [employer].  Hemorrhoids Hemorrhoids are swollen veins around the rectum or anus. There are two types of hemorrhoids:   Internal hemorrhoids. These occur in the veins just inside the rectum. They may poke through to the outside and become irritated and painful.  External hemorrhoids. These occur  in the veins outside the anus and can be felt as a painful swelling or hard lump near the anus. CAUSES  Pregnancy.   Obesity.   Constipation or diarrhea.   Straining to have a bowel movement.    Sitting for long periods on the toilet.  Heavy lifting or other activity that caused you to strain.  Anal intercourse. SYMPTOMS   Pain.   Anal itching or irritation.   Rectal bleeding.   Fecal leakage.   Anal swelling.   One or more lumps around the anus.  DIAGNOSIS  Your caregiver may be able to diagnose hemorrhoids by visual examination. Other examinations or tests that may be performed include:   Examination of the rectal area with a gloved hand (digital rectal exam).   Examination of anal canal using a small tube (scope).   A blood test if you have lost a significant amount of blood.  A test to look inside the colon (sigmoidoscopy or colonoscopy). TREATMENT Most hemorrhoids can be treated at home. However, if symptoms do not seem to be getting better or if you have a lot of rectal bleeding, your caregiver may perform a procedure to help make the hemorrhoids get smaller or remove them completely. Possible treatments include:   Placing a rubber band at the base of the hemorrhoid to cut off the circulation (rubber band ligation).   Injecting a chemical to shrink the hemorrhoid (sclerotherapy).   Using a tool to burn the hemorrhoid (infrared light therapy).   Surgically removing the hemorrhoid (hemorrhoidectomy).   Stapling the hemorrhoid to block blood flow to the tissue (hemorrhoid stapling).  HOME CARE INSTRUCTIONS   Eat foods with fiber, such as whole grains, beans, nuts, fruits, and vegetables. Ask your doctor about taking products with added fiber in them (fibersupplements).  Increase fluid intake. Drink enough water and fluids to keep your urine clear or pale yellow.   Exercise regularly.   Go to the bathroom when you have the urge to have a bowel movement. Do not wait.   Avoid straining to have bowel movements.   Keep the anal area dry and clean. Use wet toilet paper or moist towelettes after a bowel movement.   Medicated creams  and suppositories may be used or applied as directed.   Only take over-the-counter or prescription medicines as directed by your caregiver.   Take warm sitz baths for 15-20 minutes, 3-4 times a day to ease pain and discomfort.   Place ice packs on the hemorrhoids if they are tender and swollen. Using ice packs between sitz baths may be helpful.   Put ice in a plastic bag.   Place a towel between your skin and the bag.   Leave the ice on for 15-20 minutes, 3-4 times a day.   Do not use a donut-shaped pillow or sit on the toilet for long periods. This increases blood pooling and pain.  SEEK MEDICAL CARE IF:  You have increasing pain and swelling that is not controlled by treatment or medicine.  You have uncontrolled bleeding.  You have difficulty or you are unable to have a bowel movement.  You have pain or inflammation outside the area of the hemorrhoids. MAKE SURE YOU:  Understand these instructions.  Will watch your condition.  Will get help right away if you are not doing well or get worse.   This information is not intended to replace advice given to you by your health care provider. Make sure you discuss any questions you  have with your health care provider.   Document Released: 06/08/2000 Document Revised: 05/28/2012 Document Reviewed: 04/15/2012 Elsevier Interactive Patient Education 2016 [employer].   Emergency Department Resource Guide 1) Find a Doctor and Pay Out of Pocket Although you won't have to find out who is covered by your insurance plan, it is a good idea to ask around and get recommendations. You will then need to call the office and see if the doctor you have chosen will accept you as a new patient and what types of options they offer for patients who are self-pay. Some doctors offer discounts or will set up payment plans for their patients who do not have insurance, but you will need to ask so you aren't surprised when you get to your  appointment.  2) Contact Your Local Health Department Not all health departments have doctors that can see patients for sick visits, but many do, so it is worth a call to see if yours does. If you don't know where your local health department is, you can check in your phone book. The CDC also has a tool to help you locate your state's health department, and many state websites also have listings of all of their local health departments.  3) Find a [facility] If your illness is not likely to be very severe or complicated, you may want to try a walk in clinic. These are popping up all over the country in pharmacies, drugstores, and shopping centers. They're usually staffed by nurse practitioners or physician assistants that have been trained to treat common illnesses and complaints. They're usually fairly quick and inexpensive. However, if you have serious medical issues or chronic medical problems, these are probably not your best option.  No Primary Care Doctor: - Call Health Connect at  [phone] - they can help you locate a primary care doctor that  accepts your insurance, provides certain services, etc. - Physician Referral Service- [phone]  Chronic Pain Problems: Organization         Address  Phone   Notes  [facility]  [phone] Patients need to be referred by their primary care doctor.   Medication Assistance: Organization         Address  Phone   Notes  [facility] Medication [facility] [address] [place], [place] [address] [phone] --Must be a resident of [facility] -- Must have NO insurance coverage whatsoever (no Medicaid/ Medicare, etc.) -- The pt. MUST have a primary care doctor that directs their care regularly and follows them in the community   MedAssist  [phone]   [employer]  [phone]    Agencies that provide inexpensive medical care: Organization         Address  Phone   Notes  [place]  [phone]   [facility]    [phone]   [facility] [place], [place] [address] [phone]   [facility] [address] [place] [phone]   Planned Parenthood    [phone]   [facility]    [phone]   [address], [place] Phone:  [phone], Fax:  [phone] Hours of Operation:  9 am - 6 pm, M-F.  Also accepts Medicaid/Medicare and self-pay.  [facility] for [place] [address], [address], [place]  Phone: [phone], Fax: [phone]. Hours of Operation:  8:30 am - 5:30 pm, M-F.  Also accepts Medicaid and self-pay.  [facility] [address] [place] Phone: [phone]   [place], [place], [place] [phone], Ext. [phone] Mondays & Thursdays: 7-9 AM.  First 15 patients are seen on a first come, first serve basis.    [place] Providers:  Organization         Address  Phone   Notes  [facility] [address], [place] [phone] Also accepts self-pay patients.  [facility] [id number] [place], [place]  [phone]   [place], [address], [place] [phone]   [facility] [address] [place] [phone]   Lucianne Lei [address], [place]   [phone] Only accepts [place] Access [place] patients after they have their name applied to their card.   Self-Pay (no insurance) in [facility]:  Organization         Address  Phone   Notes  Sickle Cell Patients, [facility] [place] [phone]   [facility] Care [place] [phone]   [facility]  Emily  [place], [place], [place] [phone]   [facility]/Dr. Osei-Bonsu  [address], [place] or [address], [place] [phone] Phone number for both [place] and [place] locations is the same.  [facility] [address], [place] [phone]   [facility] [address] [place] or [address] [phone]   [facility] [address], [place] [phone], phone; [phone], fax Sees patients 1st and 3rd Saturday of every month.  Must not qualify for public or private insurance (i.e. Medicaid, Medicare, [place] Health Choice, Veterans' Benefits)  Household income should be no more than 200% of the poverty level The clinic cannot treat you if you are pregnant or think you are pregnant  Sexually transmitted diseases are not treated at the clinic.    Dental Care: Organization         Address  Phone  Notes  [facility] Department of [facility] [place], [place] [phone] Accepts children up to age 81 who are enrolled in [place] or [place]; pregnant women with a Medicaid card; and children who have applied for Medicaid or  Health Choice, but were declined, whose parents can pay a reduced fee at time of service.  [facility] Department of [facility]  [address], [place] [phone] Accepts children up to age 20 who are enrolled in [place] or [place]; pregnant women with a Medicaid card; and children who have applied for Medicaid or  Health Choice, but were declined, whose parents can pay a reduced fee at time of service.  [place] Adult [facility] Access PROGRAM  [place] [phone] Patients are seen by appointment only. Walk-ins are not accepted. [place] will see patients 82 years of age and  older. Monday - Tuesday (8am-5pm) Most Wednesdays (8:30-5pm) $30 per visit, cash only  [place]  [address], [facility] [phone] Patients are seen by appointment only. Walk-ins are not accepted. [place] will see patients 60 years of age and older. One Wednesday Evening (Monthly: Volunteer Based).  $30 per visit, cash only  [place]  [phone] for adults; Children under age 17, call Graduate Pediatric Dentistry at [phone]. Children aged 11-14, please call [phone] to request a pediatric application.  Dental services are provided in all areas of dental care including fillings, crowns and bridges, complete and partial dentures, implants, gum treatment, root canals, and extractions. Preventive care is also provided. Treatment is provided to both adults and children. Patients are selected via a lottery and there is often a waiting list.   [facility] [address] [place]  [phone] [email or web address]   [facility] [address] [place], [place] [phone], Ext. [phone] Second and Fourth Thursday of each month, opens at 6:30 AM; Clinic ends at 9 AM.  Patients are seen on a first-come first-served basis, and a limited number are seen during each clinic.   [facility]  [address] Hillard Danker [place], [place] [phone]   Eligibility Requirements You must have lived in [place], [place], or [place] counties for at least the last three months.   You cannot be eligible for state or federal sponsored [employer], including [employer], [place], or [employer].   You generally cannot be eligible for healthcare insurance through your employer.    How to apply: Eligibility screenings are held every Tuesday and Wednesday afternoon from 1:00 pm until 4:00 pm. You do not need an appointment for the interview!  [facility] [address],  [place], [place]   [place]  [place] Department  [place]  [phone]    Behavioral Health Resources in the Community: Intensive Outpatient Programs Organization         Address  Phone  Notes  [place]. [address], [place], [place] [phone]   [facility] Outpatient [address] [place], [place]   ADS: [facility] [address] [place], [place]   [place] [address],  [place], [place] or [phone]   Substance Abuse Resources Organization         Address  Phone  Notes  Alcohol and Drug Services  [phone]   [place]  [phone]   The [place]   Chinita Pester  [phone]   Residential & Outpatient Substance Abuse Program  [phone]   Psychological Services Organization         Address  Phone  Notes  [facility] [place]  [place]  [phone]   [place] [address] [place] or [phone]    Mobile Crisis Teams Organization         Address  Phone  Notes  [facility], Mobile Crisis Care Unit  [phone]   [facility]  [address] [place], [place]   [place] Levels [address] [place] [phone]    Self-Help/Support Groups Organization         Address  Phone             Notes  [place]. of [place] - variety of support  groups  [phone] Call for more information  Narcotics Anonymous (NA), [facility] [address], [employer] [place]  2 meetings at this location   Residential Facilities manager         Address  Phone  Notes  [facility] [place],    [place]  [phone]   [facility]  [address], [place] [id number], [place], [place]   [place], [place] [phone] Admissions: 8am-3pm M-F  Incentives Substance [place] [address],    [place], [place] [phone]   [facility] [address] [place], [place], [place]   [facility] [address]  [place], [place]   [facility] - Intensive Outpatient [address] Kristeen Mans [address], [place], [place]   [facility] ([place].) [place].,  [place], [place] [phone] or [phone]   [facility] Services (RTS) [address], [place], [place] Accepts Medicaid  [facility] [place] [address],  [place] [phone] Substance Abuse/Addiction Treatment   [facility] Organization         Address  Phone  Notes  [facility]  [phone]   Domenic Schwab, PhD [address] Arlis Porta [place], [place]   [phone] or [phone]   [place], [place] [phone]   [facility] [address] [place], [place] [phone] Insurance/Medicaid/sponsorship through [facility] and Families [address], [address]                                    [place], [place] [phone] [place] [address], [place] [phone]    Dr. Adele Schilder  [phone]   [facility] [place] Dept. 1) [address], [place] 2) [place] 3)  [place] [address], [place] [phone]  [phone]   [place] [phone] or [phone] (After Hours)

## 2015-08-17 ENCOUNTER — Emergency Department (HOSPITAL_COMMUNITY): Payer: Self-pay

## 2015-08-17 ENCOUNTER — Encounter (HOSPITAL_COMMUNITY): Payer: Self-pay | Admitting: Cardiology

## 2015-08-17 ENCOUNTER — Emergency Department (HOSPITAL_COMMUNITY)
Admission: EM | Admit: 2015-08-17 | Discharge: 2015-08-17 | Disposition: A | Discharge status: Home / Self Care | Payer: Self-pay | Attending: Emergency Medicine | Admitting: Emergency Medicine

## 2015-08-17 DIAGNOSIS — F17200 Nicotine dependence, unspecified, uncomplicated: Secondary | ICD-10-CM | POA: Insufficient documentation

## 2015-08-17 DIAGNOSIS — E7800 Pure hypercholesterolemia, unspecified: Secondary | ICD-10-CM | POA: Insufficient documentation

## 2015-08-17 DIAGNOSIS — Z9861 Coronary angioplasty status: Secondary | ICD-10-CM | POA: Insufficient documentation

## 2015-08-17 DIAGNOSIS — I2510 Atherosclerotic heart disease of native coronary artery without angina pectoris: Secondary | ICD-10-CM | POA: Insufficient documentation

## 2015-08-17 DIAGNOSIS — Z79899 Other long term (current) drug therapy: Secondary | ICD-10-CM | POA: Insufficient documentation

## 2015-08-17 DIAGNOSIS — I10 Essential (primary) hypertension: Secondary | ICD-10-CM | POA: Insufficient documentation

## 2015-08-17 DIAGNOSIS — Z7982 Long term (current) use of aspirin: Secondary | ICD-10-CM | POA: Insufficient documentation

## 2015-08-17 DIAGNOSIS — R0602 Shortness of breath: Secondary | ICD-10-CM | POA: Insufficient documentation

## 2015-08-17 LAB — BASIC METABOLIC PANEL
Anion gap: 10 (ref 5–15)
BUN: 12 mg/dL (ref 6–20)
CALCIUM: 9.8 mg/dL (ref 8.9–10.3)
CO2: 26 mmol/L (ref 22–32)
CREATININE: 1.02 mg/dL (ref 0.61–1.24)
Chloride: 104 mmol/L (ref 101–111)
GFR calc Af Amer: >60 mL/min (ref >60)
GLUCOSE: 102 mg/dL — AB (ref 65–99)
Potassium: 4 mmol/L (ref 3.5–5.1)
Sodium: 140 mmol/L (ref 135–145)

## 2015-08-17 LAB — CBC
HCT: 44 % (ref 39.0–52.0)
Hemoglobin: 14.3 g/dL (ref 13.0–17.0)
MCH: 28.4 pg (ref 26.0–34.0)
MCHC: 32.5 g/dL (ref 30.0–36.0)
MCV: 87.5 fL (ref 78.0–100.0)
Platelets: 196 10*3/uL (ref 150–400)
RBC: 5.03 MIL/uL (ref 4.22–5.81)
RDW: 14.4 % (ref 11.5–15.5)
WBC: 4.7 10*3/uL (ref 4.0–10.5)

## 2015-08-17 LAB — I-STAT TROPONIN, ED
TROPONIN I, POC: 0.01 ng/mL (ref 0.00–0.08)
Troponin i, poc: 0.01 ng/mL (ref 0.00–0.08)

## 2015-08-17 NOTE — Care Management Note (Signed)
Case Management Note  Patient Details  Name: [name] [name] MRN: [id number] Date of Birth: [birth year]  Subjective/Objective:                  60 yo patient presents to the Emergency Department complaining of SOB. //Home alone.  Action/Plan: Follow for disposition needs; set up follow-up appt.   Expected Discharge Date:     08/17/15             Expected Discharge Plan:  Home/Self Care  In-House Referral:  NA  Discharge planning Services  CM Consult, Follow-up appt scheduled  Post Acute Care Choice:  NA Choice offered to:  Patient  DME Arranged:  N/A DME Agency:  NA  HH Arranged:  NA HH Agency:  NA  Status of Service:  Completed, signed off  Medicare Important Message Given:    Date Medicare IM Given:    Medicare IM give by:    Date Additional Medicare IM Given:    Additional Medicare Important Message give by:     If discussed at [place] of Stay Meetings, dates discussed:    Additional Comments: Aubriella Perezgarcia J. Clydene Laming, RN, BSN, [place] [phone] ER CM consulted regarding PCP establishment and insurance enrollment. Pt presented to [facility] ER today with SOB. NCM met with pt at bedside; pt confirms not having access to f/u care with PCP or insurance coverage. Discussed with patient importance and benefits of establishing PCP, and not utilizing the ER for primary care needs. Pt verbalized understanding and is in agreement.  NCM advised that Internal Medicine providers at [facility] are accepting new pts that are uninsured. Pt verbalized understanding and asked to have appointment arranged. NCM [facility] with Cammie Sickle, [place] on 3/9 at 2:00.  NCM informed pt they may visit [place] and [place] for Rx needs upon discharge.   Fuller Mandril, RN 08/17/2015, 2:35 PM

## 2015-08-17 NOTE — Discharge Instructions (Signed)
Return here as needed.  Follow-up with the wellness Center.  Your testing here today did not show any significant abnormality

## 2015-08-17 NOTE — ED Provider Notes (Signed)
CSN: [id number]     Arrival date & time 08/17/15  [id number] History   First MD Initiated Contact with Patient 08/17/15 1327     Chief Complaint  Patient presents with  . Shortness of Breath     (Consider location/radiation/quality/duration/timing/severity/associated sxs/prior Treatment) HPI Patient presents to the Emergency Department complaining of SOB. Patient has a PMH of CAD, HLD, HTN. He began having SOB for about a month, but it began worsening two days ago. He states that he feels like he cant catch his breath and he is having difficulty breathing. Patient constantly feels this way. Nothing alleviates it. He also reports some weakness all over and some joint pain. Patient states that he is under a lot of stress lately over the past month and is unsure if this was the cause. He has not taken anything for his symptoms. He is a current smoker with a 15 pack year history. Patient drinks around 2 fifths per week. Patient endorses a headache that began two days ago. Patient reports having had one episode of hematochezia yesterday morning. He came to the ED over a month ago for the same problem, but this is the first episode since then. Patient denies chest pain, syncope, dizziness, palpitations, swelling abdominal pain, nausea, vomiting, diarrhea, constipation, dysuria, hematuria, numbness, tingling, vision changes, rhinorrhea, fever, chills, cough, sore throat, hearing loss, sputum production, rash.  Past Medical History  Diagnosis Date  . High cholesterol   . Coronary artery disease     s/p stent   . HTN (hypertension)   . Smoker     1/2 ppd x >30 years    Past Surgical History  Procedure Laterality Date  . Coronary angioplasty with stent placement Left 2011   Family History  Problem Relation Age of Onset  . Hypertension      grandparents    Social History  Substance Use Topics  . Smoking status: Current Every Day Smoker -- 0.50 packs/day for 30 years  . Smokeless tobacco: Current User   . Alcohol Use: 3.0 oz/week    1 Glasses of wine, 4 Cans of beer per week     Comment: Drinks 2 fifths in one week (total of ~1550mL)    Review of Systems All other systems negative except as documented in the HPI. All pertinent positives and negatives as reviewed in the HPI.  Allergies  Review of patient's allergies indicates no known allergies.  Home Medications   Prior to Admission medications   Medication Sig Start Date End Date Taking? Authorizing Provider  aspirin 81 MG EC tablet Take 1 tablet (81 mg total) by mouth daily. 07/22/13   Pollie Friar, MD  lisinopril (PRINIVIL,ZESTRIL) 10 MG tablet Take 1 tablet (10 mg total) by mouth daily. 07/22/13   Pollie Friar, MD  lovastatin (MEVACOR) 20 MG tablet Take 2 tablets (40 mg total) by mouth at bedtime. 07/22/13   Pollie Friar, MD  Multiple Vitamin (MULTIVITAMIN WITH MINERALS) TABS tablet Take 1 tablet by mouth daily. 07/22/13   Pollie Friar, MD   BP 138/79 mmHg  Pulse 99  Temp(Src) 98.4 F (36.9 C) (Oral)  Resp 16  SpO2 99% Physical Exam  Constitutional: He is oriented to person, place, and time. He appears well-developed and well-nourished. No distress.  HENT:  Head: Normocephalic and atraumatic.  Right Ear: External ear normal.  Left Ear: External ear normal.  Mouth/Throat: Oropharynx is clear and moist.  Eyes: Conjunctivae and EOM are normal. Pupils are equal, round,  and reactive to light.  Neck: Normal range of motion. Neck supple.  Cardiovascular: Normal rate, regular rhythm, normal heart sounds and intact distal pulses.  Exam reveals no gallop and no friction rub.   No murmur heard. Pulmonary/Chest: Effort normal and breath sounds normal. No respiratory distress. He has no wheezes. He has no rales.  Abdominal: Soft. Bowel sounds are normal. He exhibits no distension. There is no tenderness. There is no rebound and no guarding.  Musculoskeletal: Normal range of motion. He exhibits no edema or tenderness.  Neurological:  He is alert and oriented to person, place, and time.  Skin: Skin is warm and dry. No rash noted. He is not diaphoretic. No erythema.    ED Course  Procedures (including critical care time) Labs Review Labs Reviewed  BASIC METABOLIC PANEL - Abnormal; Notable for the following:    Glucose, Bld 102 (*)    All other components within normal limits  CBC  I-STAT TROPOININ, ED    Imaging Review Dg Chest 2 View  08/17/2015  CLINICAL DATA:  60 year old male with shortness of breath for 2 days. Initial encounter. Smoker. Initial encounter. EXAM: CHEST  2 VIEW COMPARISON:  07/21/2013 and earlier. FINDINGS: Stable upper limits of normal to hyperinflated lungs. Stable mediastinal contours, negative aside from mild thoracic aorta tortuosity. Visualized tracheal air column is within normal limits. No pneumothorax, pulmonary edema, pleural effusion or acute pulmonary opacity. Small calcified granuloma or rib lesion overlying the anterior left sixth rib is unchanged. No acute osseous abnormality identified. IMPRESSION: No acute cardiopulmonary abnormality. Electronically Signed   By: Genevie Ann M.D.   On: 08/17/2015 11:17   I have personally reviewed and evaluated these images and lab results as part of my medical decision-making.   EKG Interpretation   Date/Time:  Wednesday August 17 2015 09:39:20 EST Ventricular Rate:  70 PR Interval:  170 QRS Duration: 100 QT Interval:  390 QTC Calculation: 421 R Axis:   86 Text Interpretation:  Normal sinus rhythm Biatrial enlargement Left  ventricular hypertrophy Abnormal ECG Since last tracing rate slower  Confirmed by MILLER  MD, BRIAN ([id number]) on 08/17/2015 9:46:11 AM      Patient's shortness of breath has been ongoing for about a month, but got worse in the last 2 days.  The patient states he has not had any chest pain, nausea, vomiting, weakness, dizziness, headache, blurred vision, calf swelling, edema, or syncope.  Patient does have an appointment with a  primary care doctor.  Also advised him follow-up with his cardiologist.  She has been stable here in the emergency department.  Patient is low risk based on well's criteria   Dalia Heading, PA-C 08/17/15 1526  Dorie Rank, MD 08/17/15 [phone]

## 2015-08-17 NOTE — ED Notes (Signed)
Reports SOB for the past 2 days. Also having dizziness. No neuro deficits.

## 2015-08-17 NOTE — ED Notes (Signed)
Pt remains monitored by blood pressure, pulse ox, and 5 lead.  

## 2015-09-01 ENCOUNTER — Ambulatory Visit: Payer: Self-pay | Admitting: Family Medicine

## 2015-09-17 ENCOUNTER — Encounter (HOSPITAL_COMMUNITY): Payer: Self-pay | Admitting: *Deleted

## 2015-09-17 ENCOUNTER — Emergency Department (HOSPITAL_COMMUNITY)
Admission: EM | Admit: 2015-09-17 | Discharge: 2015-09-17 | Disposition: A | Discharge status: Home / Self Care | Payer: Self-pay | Attending: Emergency Medicine | Admitting: Emergency Medicine

## 2015-09-17 DIAGNOSIS — Z7982 Long term (current) use of aspirin: Secondary | ICD-10-CM | POA: Insufficient documentation

## 2015-09-17 DIAGNOSIS — F17210 Nicotine dependence, cigarettes, uncomplicated: Secondary | ICD-10-CM | POA: Insufficient documentation

## 2015-09-17 DIAGNOSIS — I10 Essential (primary) hypertension: Secondary | ICD-10-CM | POA: Insufficient documentation

## 2015-09-17 DIAGNOSIS — Z79899 Other long term (current) drug therapy: Secondary | ICD-10-CM | POA: Insufficient documentation

## 2015-09-17 DIAGNOSIS — N492 Inflammatory disorders of scrotum: Secondary | ICD-10-CM | POA: Insufficient documentation

## 2015-09-17 DIAGNOSIS — E782 Mixed hyperlipidemia: Secondary | ICD-10-CM | POA: Insufficient documentation

## 2015-09-17 DIAGNOSIS — I2510 Atherosclerotic heart disease of native coronary artery without angina pectoris: Secondary | ICD-10-CM | POA: Insufficient documentation

## 2015-09-17 MED ORDER — HYDROCODONE-ACETAMINOPHEN 5-325 MG PO TABS: 1.0000 | ORAL_TABLET | Freq: Four times a day (QID) | ORAL | Status: DC | PRN

## 2015-09-17 MED ORDER — HYDROCODONE-ACETAMINOPHEN 5-325 MG PO TABS
1.0000 | ORAL_TABLET | Freq: Once | ORAL | Status: AC
  Administered 2015-09-17: 1 via ORAL
  Filled 2015-09-17: qty 1

## 2015-09-17 MED ORDER — CLINDAMYCIN HCL 150 MG PO CAPS: 450.0000 mg | ORAL_CAPSULE | Freq: Four times a day (QID) | ORAL | Status: DC

## 2015-09-17 MED ORDER — LIDOCAINE HCL 2 % IJ SOLN
5.0000 mL | Freq: Once | INTRAMUSCULAR | Status: AC
  Administered 2015-09-17: 100 mg
  Filled 2015-09-17: qty 20

## 2015-09-17 NOTE — ED Notes (Signed)
PT reports abscess to upper groin.

## 2015-09-17 NOTE — ED Notes (Signed)
Declined W/C at D/C and was escorted to lobby by RN. 

## 2015-09-17 NOTE — ED Provider Notes (Signed)
CSN: [id number]     Arrival date & time 09/17/15  1016 History  By signing my name below, I, [name] [name], attest that this documentation has been prepared under the direction and in the presence of [employer] Lions PA-C. Electronically Signed: Rayna [name], ED Scribe. 09/17/2015. 10:29 AM.   Chief Complaint  Patient presents with  . Abscess   The history is provided by the patient. No language interpreter was used.   HPI Comments: [name] [name] is a 60 y.o. male who presents to the Emergency Department complaining of a worsening, moderate, boil to the left side of his scrotum onset 3 days ago. He reports associated, intermittent, warmth and constant, 6/10, pain surrounding the boil that worsens with palpation. Pt reports a PMHx of similar symptoms but none of this severity. Pt confirms his listed PMHx and denies a PMHx of DM. Pt denies fever, chills, drainage or any other associated symptoms at this time.   Past Medical History  Diagnosis Date  . High cholesterol   . Coronary artery disease     s/p stent   . HTN (hypertension)   . Smoker     1/2 ppd x >30 years    Past Surgical History  Procedure Laterality Date  . Coronary angioplasty with stent placement Left 2011   Family History  Problem Relation Age of Onset  . Hypertension      grandparents    Social History  Substance Use Topics  . Smoking status: Current Every Day Smoker -- 0.50 packs/day for 30 years  . Smokeless tobacco: Current User  . Alcohol Use: 3.0 oz/week    1 Glasses of wine, 4 Cans of beer per week     Comment: Drinks 2 fifths in one week (total of ~1574mL)    Review of Systems A complete 10 system review of systems was obtained and all systems are negative except as noted in the HPI and PMH.   Allergies  Review of patient's allergies indicates no known allergies.  Home Medications   Prior to Admission medications   Medication Sig Start Date End Date Taking? Authorizing Provider   aspirin 81 MG EC tablet Take 1 tablet (81 mg total) by mouth daily. 07/22/13   Pollie Friar, MD  lisinopril (PRINIVIL,ZESTRIL) 10 MG tablet Take 1 tablet (10 mg total) by mouth daily. 07/22/13   Pollie Friar, MD  lovastatin (MEVACOR) 20 MG tablet Take 2 tablets (40 mg total) by mouth at bedtime. 07/22/13   Pollie Friar, MD  Multiple Vitamin (MULTIVITAMIN WITH MINERALS) TABS tablet Take 1 tablet by mouth daily. 07/22/13   Pollie Friar, MD   BP 175/97 mmHg  Pulse 96  Temp(Src) 98.2 F (36.8 C) (Oral)  Resp 18  Wt 175 lb 9 oz (79.635 kg)  SpO2 100%    Physical Exam  Constitutional: He is oriented to person, place, and time. He appears well-developed and well-nourished.  HENT:  Head: Normocephalic and atraumatic.  Eyes: EOM are normal.  Neck: Normal range of motion.  Cardiovascular: Normal rate.   Pulmonary/Chest: Effort normal. No respiratory distress.  Abdominal: Soft.  Genitourinary:  Chaperone present  Musculoskeletal: Normal range of motion.  Neurological: He is alert and oriented to person, place, and time.  Skin: Skin is warm and dry. There is erythema.  3 cm x 4 cm erythematous abscess with mild drainage present  Psychiatric: He has a normal mood and affect.  Nursing note and vitals reviewed.   ED Course  Procedures  DIAGNOSTIC STUDIES: Oxygen Saturation is 100% on RA, normal by my interpretation.    COORDINATION OF CARE: 10:28 AM Discussed next steps with pt. He verbalized understanding and is agreeable with the plan.   INCISION AND DRAINAGE PROCEDURE NOTE: Patient identification was confirmed and verbal consent was obtained. This procedure was performed by [place] Lions, PA-C at 11:05 AM. Site: left side of scrotum Sterile procedures observed Needle size: 27 gauge Anesthetic used (type and amt): lidocaine 2% without epinephrine; 2 mL Blade size: 11 blade Drainage: copious purulent drainage Complexity: Complex Site anesthetized, incision made over  site, wound drained and explored Loculations, covered with dry, sterile dressing.  Pt tolerated procedure well without complications.  Instructions for care discussed verbally and pt provided with additional written instructions for homecare and f/u.   MDM   Final diagnoses:  Scrotal abscess   Patient with skin abscess. Incision and drainage performed in the ED today.  Abscess was not large enough to warrant packing or drain placement. Wound recheck in 2 days. Supportive care and return precautions discussed.  Pt sent home with clindamycin. The patient appears reasonably screened and/or stabilized for discharge and I doubt any other emergent medical condition requiring further screening, evaluation, or treatment in the ED prior to discharge.  I personally performed the services described in this documentation, which was scribed in my presence. The recorded information has been reviewed and is accurate.    [place] Lions, PA-C 09/20/15 [place], MD 09/24/15 [phone]

## 2015-09-17 NOTE — Discharge Instructions (Signed)
Mr. [name] [name],  Nice meeting you! TAKE ALL OF YOUR ANTIBIOTICS AS PRESCRIBED. Please follow-up for a wound check in 2-3 days here. Return to the emergency department if you develop fevers, chills, increased pain. Feel better soon!  S. Wendie Simmer, PA-C

## 2015-09-19 ENCOUNTER — Emergency Department (HOSPITAL_COMMUNITY)
Admission: EM | Admit: 2015-09-19 | Discharge: 2015-09-20 | Disposition: A | Discharge status: Home / Self Care | Payer: Self-pay | Attending: Emergency Medicine | Admitting: Emergency Medicine

## 2015-09-19 ENCOUNTER — Encounter (HOSPITAL_COMMUNITY): Payer: Self-pay | Admitting: *Deleted

## 2015-09-19 DIAGNOSIS — I2510 Atherosclerotic heart disease of native coronary artery without angina pectoris: Secondary | ICD-10-CM | POA: Insufficient documentation

## 2015-09-19 DIAGNOSIS — Z4801 Encounter for change or removal of surgical wound dressing: Secondary | ICD-10-CM | POA: Insufficient documentation

## 2015-09-19 DIAGNOSIS — I10 Essential (primary) hypertension: Secondary | ICD-10-CM | POA: Insufficient documentation

## 2015-09-19 DIAGNOSIS — F17200 Nicotine dependence, unspecified, uncomplicated: Secondary | ICD-10-CM | POA: Insufficient documentation

## 2015-09-19 NOTE — ED Notes (Signed)
Pt left without being seen.

## 2015-09-19 NOTE — ED Notes (Signed)
Pt reports being seen on Saturday for an abscess on his scrotum. Pt reports it was drained and is here for wound check. Pt has no complaints but reports being unable to fill his antibiotic prescription.

## 2015-09-22 ENCOUNTER — Telehealth: Payer: Self-pay | Admitting: General Practice

## 2015-09-22 NOTE — Telephone Encounter (Signed)
APPT. REMINDER CALL, LMTCB °

## 2015-09-26 ENCOUNTER — Ambulatory Visit: Payer: Self-pay | Admitting: Internal Medicine

## 2016-01-05 ENCOUNTER — Emergency Department (HOSPITAL_COMMUNITY)
Admission: EM | Admit: 2016-01-05 | Discharge: 2016-01-05 | Disposition: A | Discharge status: Home / Self Care | Payer: Self-pay | Attending: Emergency Medicine | Admitting: Emergency Medicine

## 2016-01-05 ENCOUNTER — Emergency Department (HOSPITAL_COMMUNITY): Payer: Self-pay

## 2016-01-05 ENCOUNTER — Encounter (HOSPITAL_COMMUNITY): Payer: Self-pay | Admitting: Emergency Medicine

## 2016-01-05 DIAGNOSIS — Y939 Activity, unspecified: Secondary | ICD-10-CM | POA: Insufficient documentation

## 2016-01-05 DIAGNOSIS — Y929 Unspecified place or not applicable: Secondary | ICD-10-CM | POA: Insufficient documentation

## 2016-01-05 DIAGNOSIS — S90121A Contusion of right lesser toe(s) without damage to nail, initial encounter: Secondary | ICD-10-CM

## 2016-01-05 DIAGNOSIS — Z7982 Long term (current) use of aspirin: Secondary | ICD-10-CM | POA: Insufficient documentation

## 2016-01-05 DIAGNOSIS — Y999 Unspecified external cause status: Secondary | ICD-10-CM | POA: Insufficient documentation

## 2016-01-05 DIAGNOSIS — W2209XA Striking against other stationary object, initial encounter: Secondary | ICD-10-CM | POA: Insufficient documentation

## 2016-01-05 DIAGNOSIS — I10 Essential (primary) hypertension: Secondary | ICD-10-CM | POA: Insufficient documentation

## 2016-01-05 DIAGNOSIS — S90111A Contusion of right great toe without damage to nail, initial encounter: Secondary | ICD-10-CM | POA: Insufficient documentation

## 2016-01-05 DIAGNOSIS — F17200 Nicotine dependence, unspecified, uncomplicated: Secondary | ICD-10-CM | POA: Insufficient documentation

## 2016-01-05 DIAGNOSIS — I2510 Atherosclerotic heart disease of native coronary artery without angina pectoris: Secondary | ICD-10-CM | POA: Insufficient documentation

## 2016-01-05 MED ORDER — IBUPROFEN 800 MG PO TABS: 800.0000 mg | ORAL_TABLET | Freq: Three times a day (TID) | ORAL | Status: DC | PRN

## 2016-01-05 NOTE — ED Provider Notes (Signed)
CSN: [id number]     Arrival date & time 01/05/16  1246 History  By signing my name below, I, [name] [name], attest that this documentation has been prepared under the direction and in the presence of  [employer], PA-C. Electronically Signed: Hansel [name], ED Scribe. 01/05/2016. 1:48 PM.    Chief Complaint  Patient presents with  . Toe Injury   The history is provided by the patient. No language interpreter was used.   HPI Comments: [name] [name] is a 60 y.o. male who presents to the Emergency Department complaining of moderate right great toe pain s/p injury that occurred 2 days ago. Pt states he struck his toe on his bed 2 days ago, causing his pain. Pt states that pain is worsened with movement, ambulation and weight-bearing. Pt denies taking OTC medications at home to improve symptoms. He is ambulatory without difficulty. He denies numbness, ankle pain, additional injuries.    Past Medical History  Diagnosis Date  . High cholesterol   . Coronary artery disease     s/p stent   . HTN (hypertension)   . Smoker     1/2 ppd x >30 years    Past Surgical History  Procedure Laterality Date  . Coronary angioplasty with stent placement Left 2011   Family History  Problem Relation Age of Onset  . Hypertension      grandparents    Social History  Substance Use Topics  . Smoking status: Current Every Day Smoker -- 0.50 packs/day for 30 years  . Smokeless tobacco: Current User  . Alcohol Use: 3.0 oz/week    1 Glasses of wine, 4 Cans of beer per week     Comment: Drinks 2 fifths in one week (total of ~1556mL)    Review of Systems A complete 10 system review of systems was obtained and all systems are negative except as noted in the HPI and PMH.    Allergies  Review of patient's allergies indicates no known allergies.  Home Medications   Prior to Admission medications   Medication Sig Start Date End Date Taking? Authorizing Provider  aspirin 81 MG EC tablet Take 1 tablet  (81 mg total) by mouth daily. 07/22/13   Pollie Friar, MD  clindamycin (CLEOCIN) 150 MG capsule Take 3 capsules (450 mg total) by mouth every 6 (six) hours. 09/17/15   [place]ala Lions, PA-C  HYDROcodone-acetaminophen (NORCO/VICODIN) 5-325 MG tablet Take 1-2 tablets by mouth every 6 (six) hours as needed. 09/17/15   [place] Lions, PA-C  lisinopril (PRINIVIL,ZESTRIL) 10 MG tablet Take 1 tablet (10 mg total) by mouth daily. 07/22/13   Pollie Friar, MD  lovastatin (MEVACOR) 20 MG tablet Take 2 tablets (40 mg total) by mouth at bedtime. 07/22/13   Pollie Friar, MD  Multiple Vitamin (MULTIVITAMIN WITH MINERALS) TABS tablet Take 1 tablet by mouth daily. 07/22/13   Pollie Friar, MD   BP 146/115 mmHg  Pulse 95  Temp(Src) 99.5 F (37.5 C) (Oral)  Resp 16  SpO2 99% Physical Exam  Constitutional: He appears well-developed and well-nourished.  HENT:  Head: Normocephalic.  Eyes: Conjunctivae are normal.  Cardiovascular: Normal rate.   Pulmonary/Chest: Effort normal. No respiratory distress.  Abdominal: He exhibits no distension.  Musculoskeletal: Normal range of motion. He exhibits tenderness.  The base of the right great toe is TTP. No overlying swelling or bruising.   Neurological: He is alert.  Sensation intact distally to the right foot. Normal gait.   Skin:  Skin is warm and dry.  Psychiatric: He has a normal mood and affect. His behavior is normal.  Nursing note and vitals reviewed.   ED Course  Procedures (including critical care time) DIAGNOSTIC STUDIES: Oxygen Saturation is 99% on RA, normal by my interpretation.    COORDINATION OF CARE: 1:39 PM Discussed treatment plan with pt at bedside which includes XR and pt agreed to plan.    Imaging Review Dg Toe Great Right  01/05/2016  CLINICAL DATA:  Kicked bed rail 2 days ago. Right great toe injury and pain. Initial encounter. EXAM: RIGHT GREAT TOE COMPARISON:  None. FINDINGS: There is no evidence of fracture or dislocation.  There is no evidence of arthropathy or other focal bone abnormality. Soft tissue swelling is seen overlying the distal phalanx. IMPRESSION: Distal soft tissue swelling. No evidence of fracture or dislocation. Electronically Signed   By: Earle Gell M.D.   On: 01/05/2016 13:35   I have personally reviewed and evaluated these images as part of my medical decision-making.    I personally performed the services described in this documentation, which was scribed in my presence. The recorded information has been reviewed and is accurate.    Dalia Heading, PA-C 01/08/16 [place], MD 01/16/16 [phone]

## 2016-01-05 NOTE — ED Notes (Signed)
Declined W/C at D/C and was escorted to lobby by RN. 

## 2016-01-05 NOTE — Discharge Instructions (Signed)
Return here as needed. Your x-rays were normal.

## 2016-01-05 NOTE — ED Notes (Signed)
Pt reports hitting great right toe on his bedrail 2 days ago. Pt reports ongoing pain since then.

## 2016-01-05 NOTE — ED Notes (Signed)
Patient transported to X-ray 

## 2016-06-23 ENCOUNTER — Encounter (HOSPITAL_COMMUNITY): Payer: Self-pay | Admitting: Certified Nurse Midwife

## 2016-06-23 ENCOUNTER — Emergency Department (HOSPITAL_COMMUNITY)
Admission: EM | Admit: 2016-06-23 | Discharge: 2016-06-23 | Disposition: A | Discharge status: Home / Self Care | Payer: Medicaid Other | Attending: Physician Assistant | Admitting: Physician Assistant

## 2016-06-23 ENCOUNTER — Emergency Department (HOSPITAL_COMMUNITY): Payer: Medicaid Other

## 2016-06-23 DIAGNOSIS — I10 Essential (primary) hypertension: Secondary | ICD-10-CM | POA: Insufficient documentation

## 2016-06-23 DIAGNOSIS — Z7982 Long term (current) use of aspirin: Secondary | ICD-10-CM | POA: Diagnosis not present

## 2016-06-23 DIAGNOSIS — W010XXA Fall on same level from slipping, tripping and stumbling without subsequent striking against object, initial encounter: Secondary | ICD-10-CM | POA: Insufficient documentation

## 2016-06-23 DIAGNOSIS — Y999 Unspecified external cause status: Secondary | ICD-10-CM | POA: Insufficient documentation

## 2016-06-23 DIAGNOSIS — S0081XA Abrasion of other part of head, initial encounter: Secondary | ICD-10-CM | POA: Insufficient documentation

## 2016-06-23 DIAGNOSIS — Z955 Presence of coronary angioplasty implant and graft: Secondary | ICD-10-CM | POA: Diagnosis not present

## 2016-06-23 DIAGNOSIS — M542 Cervicalgia: Secondary | ICD-10-CM | POA: Diagnosis not present

## 2016-06-23 DIAGNOSIS — Y929 Unspecified place or not applicable: Secondary | ICD-10-CM | POA: Diagnosis not present

## 2016-06-23 DIAGNOSIS — Y939 Activity, unspecified: Secondary | ICD-10-CM | POA: Insufficient documentation

## 2016-06-23 DIAGNOSIS — S40012A Contusion of left shoulder, initial encounter: Secondary | ICD-10-CM | POA: Diagnosis not present

## 2016-06-23 DIAGNOSIS — S4992XA Unspecified injury of left shoulder and upper arm, initial encounter: Secondary | ICD-10-CM | POA: Diagnosis present

## 2016-06-23 DIAGNOSIS — F17200 Nicotine dependence, unspecified, uncomplicated: Secondary | ICD-10-CM | POA: Diagnosis not present

## 2016-06-23 DIAGNOSIS — W19XXXA Unspecified fall, initial encounter: Secondary | ICD-10-CM

## 2016-06-23 DIAGNOSIS — I2510 Atherosclerotic heart disease of native coronary artery without angina pectoris: Secondary | ICD-10-CM | POA: Insufficient documentation

## 2016-06-23 MED ORDER — DICLOFENAC SODIUM 50 MG PO TBEC: 50.0000 mg | DELAYED_RELEASE_TABLET | Freq: Two times a day (BID) | ORAL | 0 refills | Status: DC

## 2016-06-23 MED ORDER — HYDROCODONE-ACETAMINOPHEN 5-325 MG PO TABS: 1.0000 | ORAL_TABLET | Freq: Four times a day (QID) | ORAL | 0 refills | Status: DC | PRN

## 2016-06-23 NOTE — ED Notes (Signed)
Pt out to xray.

## 2016-06-23 NOTE — ED Triage Notes (Signed)
Pt states he had a fall last Tuesday and has had increasing shoulder and neck pain since. Pt rates pain 8/10.

## 2016-06-23 NOTE — ED Notes (Signed)
Ambulated pt from waiting room to TR05, waiting for provider.

## 2016-06-23 NOTE — ED Provider Notes (Signed)
[facility] [place] DEPT Provider Note   CSN: [id number] Arrival date & time: 06/23/16  1416   By signing my name below, I, [name] [name], attest that this documentation has been prepared under the direction and in the presence of  Debroah Baller, NP. Electronically Signed: Delton [name], ED Scribe. 06/23/16. 4:20 PM.  History   Chief Complaint Chief Complaint  Patient presents with  . Fall  . Shoulder Injury   The history is provided by the patient. No language interpreter was used.  Fall  This is a new problem. The current episode started more than 2 days ago. The problem has not changed since onset.Pertinent negatives include no shortness of breath. Nothing relieves the symptoms. He has tried acetaminophen for the symptoms. The treatment provided no relief.  Shoulder Injury  This is a new problem. The current episode started more than 2 days ago. The problem has not changed since onset.Pertinent negatives include no shortness of breath. Nothing relieves the symptoms. He has tried acetaminophen for the symptoms. The treatment provided no relief.   HPI Comments:  [name] [name] is a 60 y.o. male, with a hx of HTN, hyperlipidemia and CAD, who presents to the Emergency Department complaining of sudden onset, moderate left sided shoulder pain s/p a fall which occurred 4 days ago. Pt states he was at a resource center, tripped off a curb and fell straightforward on concrete landing on his face. He also reports left sided chest pain and left neck pain and states he experienced LOC for a brief period of time. Pt was treated by EMS following the fall. He has taken tylenol and ibuprofen with no relief. Pt denies bowel/bladder incontinence, dysuria, frequency, chills, fevers, SOB, nausea, vomiting, any other associated symptoms and any other modifying factors at this time. Tetanus is UTD. His complaint tonight is continued shoulder pain since the accident.  Past Medical History:  Diagnosis Date  . Coronary  artery disease    s/p stent   . High cholesterol   . HTN (hypertension)   . Smoker    1/2 ppd x >30 years     Patient Active Problem List   Diagnosis Date Noted  . SOB (shortness of breath) on exertion 07/21/2013  . Dizziness 07/21/2013  . HTN (hypertension) 07/21/2013  . Tobacco abuse 07/21/2013  . Alcohol abuse 07/21/2013  . CAD (coronary artery disease) 07/21/2013  . H/O medication noncompliance 07/21/2013  . HLD (hyperlipidemia) 07/21/2013    Past Surgical History:  Procedure Laterality Date  . CORONARY ANGIOPLASTY WITH STENT PLACEMENT Left 2011    Home Medications    Prior to Admission medications   Medication Sig Start Date End Date Taking? Authorizing Provider  aspirin 81 MG EC tablet Take 1 tablet (81 mg total) by mouth daily. 07/22/13   Pollie Friar, MD  clindamycin (CLEOCIN) 150 MG capsule Take 3 capsules (450 mg total) by mouth every 6 (six) hours. 09/17/15   [place] Lions, PA-C  diclofenac (VOLTAREN) 50 MG EC tablet Take 1 tablet (50 mg total) by mouth 2 (two) times daily. 06/23/16   Rodneisha Bonnet Bunnie Pion, NP  HYDROcodone-acetaminophen (NORCO) 5-325 MG tablet Take 1 tablet by mouth every 6 (six) hours as needed. 06/23/16   Kearston Putman Bunnie Pion, NP  lisinopril (PRINIVIL,ZESTRIL) 10 MG tablet Take 1 tablet (10 mg total) by mouth daily. 07/22/13   Pollie Friar, MD  lovastatin (MEVACOR) 20 MG tablet Take 2 tablets (40 mg total) by mouth at bedtime. 07/22/13   Pollie Friar,  MD  Multiple Vitamin (MULTIVITAMIN WITH MINERALS) TABS tablet Take 1 tablet by mouth daily. 07/22/13   Pollie Friar, MD    Family History Family History  Problem Relation Age of Onset  . Hypertension      grandparents     Social History Social History  Substance Use Topics  . Smoking status: Current Every Day Smoker    Packs/day: 0.50    Years: 30.00  . Smokeless tobacco: Current User  . Alcohol use 3.0 oz/week    1 Glasses of wine, 4 Cans of beer per week     Comment: Drinks 2 fifths in one week  (total of ~1569mL)     Allergies   Patient has no known allergies.   Review of Systems Review of Systems  Constitutional: Negative for chills and fever.  Respiratory: Negative for shortness of breath.   Cardiovascular: Negative for palpitations.  Gastrointestinal: Negative for nausea and vomiting.  Genitourinary: Negative for dysuria and frequency.  Musculoskeletal: Positive for myalgias and neck pain.  Neurological: Positive for syncope.   Physical Exam Updated Vital Signs BP 150/85   Pulse 74   Temp 98 F (36.7 C)   Resp 12   Ht 6\' 2"  (1.88 m)   Wt 81.6 kg   SpO2 100%   BMI 23.11 kg/m   Physical Exam  Constitutional: He is oriented to person, place, and time. He appears well-developed and well-nourished. No distress.  HENT:  Head: Normocephalic and atraumatic.  Right Ear: Tympanic membrane normal.  Left Ear: Tympanic membrane normal.  Mouth/Throat: Uvula is midline. No posterior oropharyngeal edema or posterior oropharyngeal erythema.  No dental injuries   Eyes: Conjunctivae and EOM are normal. Pupils are equal, round, and reactive to light.  Neck: Trachea normal and normal range of motion. Neck supple. Muscular tenderness present.  Cardiovascular: Normal rate and regular rhythm.   Pulses:      Radial pulses are 2+ on the right side, and 2+ on the left side.  Pulmonary/Chest: Effort normal and breath sounds normal.  Abdominal: He exhibits no distension.  Musculoskeletal: He exhibits tenderness.       Left shoulder: He exhibits tenderness. He exhibits no crepitus, normal pulse and normal strength. Decreased range of motion: due to pain.  No cervicval spine tenderness. Tenderness to the musculature over the left side of neck. Tenderness over left clavicle and left shoulder. Full passive ROM of shoulder. No crepitus. RP 2+ Adequate circulation. FROM of elbow and wrist without pain.   Neurological: He is alert and oriented to person, place, and time.  Skin: Skin is  warm and dry.  Abrasion to the right side of the face near the right eye   Psychiatric: He has a normal mood and affect. His behavior is normal.  Nursing note and vitals reviewed.   ED Treatments / Results  DIAGNOSTIC STUDIES:  Oxygen Saturation is 99% on RA, normal by my interpretation.    COORDINATION OF CARE:  4:14 PM Will order imaging. Discussed treatment plan with pt at bedside and pt agreed to plan.  Labs (all labs ordered are listed, but only abnormal results are displayed) Labs Reviewed - No data to display   Radiology Dg Clavicle Left  Result Date: 06/23/2016 CLINICAL DATA:  Left shoulder pain after fall 4 days ago EXAM: LEFT CLAVICLE - 2+ VIEWS COMPARISON:  None. FINDINGS: There is no evidence of fracture or other focal bone lesions. Soft tissues are unremarkable. The adjacent ribs and lung are unremarkable. IMPRESSION:  No acute osseous abnormality. Electronically Signed   By: Ashley Royalty M.D.   On: 06/23/2016 17:44   Dg Shoulder Left  Result Date: 06/23/2016 CLINICAL DATA:  Left shoulder pain after fall 4 days ago. EXAM: LEFT SHOULDER - 2+ VIEW COMPARISON:  None. FINDINGS: There is no evidence of fracture or dislocation. There is no evidence of arthropathy or other focal bone abnormality. Soft tissues are unremarkable. IMPRESSION: Negative. Electronically Signed   By: Ashley Royalty M.D.   On: 06/23/2016 17:43    Procedures Procedures (including critical care time)  Medications Ordered in ED Medications - No data to display   Initial Impression / Assessment and Plan / ED Course  I have reviewed the triage vital signs and the nursing notes.  Pertinent  imaging results that were available during my care of the patient were reviewed by me and considered in my medical decision making (see chart for details).  Clinical Course   60 y.o. male with left clavicle and should pain s/p fall 4 days prior to ED visit stable for d/c without focal neuro deficits and no fracture  or dislocation noted on x-ray. Will treat with NSAIDS and patient to f/u with is PCP or ortho if symptoms persist.   Final Clinical Impressions(s) / ED Diagnoses   Final diagnoses:  Fall, initial encounter  Contusion of left shoulder, initial encounter  Facial abrasion, initial encounter    New Prescriptions Discharge Medication List as of 06/23/2016  6:15 PM    START taking these medications   Details  diclofenac (VOLTAREN) 50 MG EC tablet Take 1 tablet (50 mg total) by mouth 2 (two) times daily., Starting Sat 06/23/2016, Print      I personally performed the services described in this documentation, which was scribed in my presence. The recorded information has been reviewed and is accurate.     [facility] Bunnie Pion, NP 06/25/16 0130    Courteney Julio Alm, MD 06/27/16 [phone]

## 2017-01-15 ENCOUNTER — Emergency Department (HOSPITAL_COMMUNITY)
Admission: EM | Admit: 2017-01-15 | Discharge: 2017-01-15 | Disposition: A | Discharge status: Home / Self Care | Payer: Medicaid Other | Attending: Emergency Medicine | Admitting: Emergency Medicine

## 2017-01-15 ENCOUNTER — Encounter (HOSPITAL_COMMUNITY): Payer: Self-pay | Admitting: Emergency Medicine

## 2017-01-15 ENCOUNTER — Emergency Department (HOSPITAL_COMMUNITY): Payer: Medicaid Other

## 2017-01-15 DIAGNOSIS — R229 Localized swelling, mass and lump, unspecified: Secondary | ICD-10-CM

## 2017-01-15 DIAGNOSIS — I10 Essential (primary) hypertension: Secondary | ICD-10-CM | POA: Diagnosis not present

## 2017-01-15 DIAGNOSIS — D496 Neoplasm of unspecified behavior of brain: Secondary | ICD-10-CM | POA: Diagnosis not present

## 2017-01-15 DIAGNOSIS — R42 Dizziness and giddiness: Secondary | ICD-10-CM | POA: Insufficient documentation

## 2017-01-15 DIAGNOSIS — F17210 Nicotine dependence, cigarettes, uncomplicated: Secondary | ICD-10-CM | POA: Insufficient documentation

## 2017-01-15 DIAGNOSIS — Z79899 Other long term (current) drug therapy: Secondary | ICD-10-CM | POA: Insufficient documentation

## 2017-01-15 DIAGNOSIS — I2510 Atherosclerotic heart disease of native coronary artery without angina pectoris: Secondary | ICD-10-CM | POA: Insufficient documentation

## 2017-01-15 DIAGNOSIS — IMO0002 Reserved for concepts with insufficient information to code with codable children: Secondary | ICD-10-CM

## 2017-01-15 LAB — BASIC METABOLIC PANEL
Anion gap: 11 (ref 5–15)
BUN: 16 mg/dL (ref 6–20)
CALCIUM: 9.1 mg/dL (ref 8.9–10.3)
CO2: 23 mmol/L (ref 22–32)
CREATININE: 1.16 mg/dL (ref 0.61–1.24)
Chloride: 103 mmol/L (ref 101–111)
GFR calc Af Amer: >60 mL/min (ref >60)
GLUCOSE: 99 mg/dL (ref 65–99)
Potassium: 3.9 mmol/L (ref 3.5–5.1)
Sodium: 137 mmol/L (ref 135–145)

## 2017-01-15 LAB — RAPID URINE DRUG SCREEN, HOSP PERFORMED
Amphetamines: NOT DETECTED
Barbiturates: NOT DETECTED
Benzodiazepines: NOT DETECTED
Cocaine: NOT DETECTED
Opiates: NOT DETECTED
Tetrahydrocannabinol: NOT DETECTED

## 2017-01-15 LAB — URINALYSIS, ROUTINE W REFLEX MICROSCOPIC
BILIRUBIN URINE: NEGATIVE
Glucose, UA: NEGATIVE mg/dL
Hgb urine dipstick: NEGATIVE
KETONES UR: NEGATIVE mg/dL
Leukocytes, UA: NEGATIVE
NITRITE: NEGATIVE
PH: 6 (ref 5.0–8.0)
Protein, ur: NEGATIVE mg/dL
Specific Gravity, Urine: 1.006 (ref 1.005–1.030)

## 2017-01-15 LAB — CBC
HCT: 37.5 % — ABNORMAL LOW (ref 39.0–52.0)
Hemoglobin: 12.5 g/dL — ABNORMAL LOW (ref 13.0–17.0)
MCH: 28.2 pg (ref 26.0–34.0)
MCHC: 33.3 g/dL (ref 30.0–36.0)
MCV: 84.5 fL (ref 78.0–100.0)
Platelets: 139 10*3/uL — ABNORMAL LOW (ref 150–400)
RBC: 4.44 MIL/uL (ref 4.22–5.81)
RDW: 13.9 % (ref 11.5–15.5)
WBC: 5 10*3/uL (ref 4.0–10.5)

## 2017-01-15 LAB — I-STAT TROPONIN, ED: Troponin i, poc: 0 ng/mL (ref 0.00–0.08)

## 2017-01-15 LAB — HEPATIC FUNCTION PANEL
ALK PHOS: 49 U/L (ref 38–126)
ALT: 19 U/L (ref 17–63)
AST: 28 U/L (ref 15–41)
Albumin: 4 g/dL (ref 3.5–5.0)
BILIRUBIN INDIRECT: 0.3 mg/dL (ref 0.3–0.9)
BILIRUBIN TOTAL: 0.5 mg/dL (ref 0.3–1.2)
Bilirubin, Direct: 0.2 mg/dL (ref 0.1–0.5)
Total Protein: 7.5 g/dL (ref 6.5–8.1)

## 2017-01-15 LAB — ETHANOL: ALCOHOL ETHYL (B): 152 mg/dL — AB (ref <5)

## 2017-01-15 LAB — CBG MONITORING, ED: Glucose-Capillary: 103 mg/dL — ABNORMAL HIGH (ref 65–99)

## 2017-01-15 MED ORDER — SODIUM CHLORIDE 0.9 % IV BOLUS (SEPSIS)
1000.0000 mL | Freq: Once | INTRAVENOUS | Status: AC
  Administered 2017-01-15: 1000 mL via INTRAVENOUS

## 2017-01-15 MED ORDER — GADOBENATE DIMEGLUMINE 529 MG/ML IV SOLN
20.0000 mL | Freq: Once | INTRAVENOUS | Status: AC | PRN
  Administered 2017-01-15: 17 mL via INTRAVENOUS

## 2017-01-15 NOTE — ED Triage Notes (Signed)
Per EMS pt complaint of near syncope post episode of dizziness. Denies other symptoms or pain. [place] and marijuana use.

## 2017-01-15 NOTE — ED Provider Notes (Signed)
3:38 PM Care assumed from Dr. Roderic Palau.   At time of transfer of care, patient is awaiting MRI to further evaluate his nose discovered intracranial mass that likely contributed to his dizziness and syncopal episode today. Next  Anticipate speaking with neurosurgery after imaging completed.  MRI confirmed CT findings of intracranial mass. MRI appears to show meningioma with some mass effect.  Neurosurgery was called with the findings. After their evaluation of the imaging, they did not find need for inpatient management or steroids at this time. Patient was reassessed and had no neurologic symptoms. He had no headaches, nausea, vomiting, vision changes or dizziness. No numbness, tingling, or weakness on reexamination.  Given reassuring exam, patient was felt stable for discharge home with outpatient follow-up this week with neurosurgery. Patient was given information of neurosurgery team and was encouraged to follow-up given his new brain mass.  Patient voiced understanding of the plan of care understood return precautions for any new or worsened symptoms. Patient discharged in good condition.     Clinical Impression: 1. Lightheaded   2. Mass   3. Neoplasm of brain causing mass effect on adjacent structures [facility])     Disposition: Discharge  Condition: Good  I have discussed the results, Dx and Tx plan with the pt(& family if present). He/she/they expressed understanding and agree(s) with the plan. Discharge instructions discussed at great length. Strict return precautions discussed and pt &/or family have verbalized understanding of the instructions. No further questions at time of discharge.    Discharge Medication List as of 01/15/2017  8:13 PM      Follow Up: Erline Levine, MD [address] [place] [id number] [phone]  Schedule an appointment as soon as possible for a visit in 1 week      Alexandr Oehler, Gwenyth Allegra, MD 01/16/17 [phone]

## 2017-01-15 NOTE — ED Provider Notes (Signed)
[place] DEPT Provider Note   CSN: [id number] Arrival date & time: 01/15/17  1308     History   Chief Complaint Chief Complaint  Patient presents with  . Near Syncope    HPI [name] [name] is a 61 y.o. male.  Patient states that he was drinking alcohol and had been using some marijuana and felt dizzy and passed out. He complains of mild dizziness now   The history is provided by the patient.  Near Syncope  This is a new problem. The current episode started 1 to 2 hours ago. The problem occurs rarely. The problem has been resolved. Pertinent negatives include no chest pain, no abdominal pain and no headaches. Nothing aggravates the symptoms. Nothing relieves the symptoms. He has tried nothing for the symptoms. The treatment provided no relief.    Past Medical History:  Diagnosis Date  . Coronary artery disease    s/p stent   . High cholesterol   . HTN (hypertension)   . Smoker    1/2 ppd x >30 years     Patient Active Problem List   Diagnosis Date Noted  . SOB (shortness of breath) on exertion 07/21/2013  . Dizziness 07/21/2013  . HTN (hypertension) 07/21/2013  . Tobacco abuse 07/21/2013  . Alcohol abuse 07/21/2013  . CAD (coronary artery disease) 07/21/2013  . H/O medication noncompliance 07/21/2013  . HLD (hyperlipidemia) 07/21/2013    Past Surgical History:  Procedure Laterality Date  . CORONARY ANGIOPLASTY WITH STENT PLACEMENT Left 2011       Home Medications    Prior to Admission medications   Medication Sig Start Date End Date Taking? Authorizing Provider  lisinopril (PRINIVIL,ZESTRIL) 10 MG tablet Take 1 tablet (10 mg total) by mouth daily. 07/22/13  Yes Cater, Wynelle Bourgeois, MD  lovastatin (MEVACOR) 20 MG tablet Take 2 tablets (40 mg total) by mouth at bedtime. 07/22/13  Yes Cater, Wynelle Bourgeois, MD  aspirin 81 MG EC tablet Take 1 tablet (81 mg total) by mouth daily. Patient not taking: Reported on 01/15/2017 07/22/13   Cater, Wynelle Bourgeois, MD  clindamycin  (CLEOCIN) 150 MG capsule Take 3 capsules (450 mg total) by mouth every 6 (six) hours. Patient not taking: Reported on 01/15/2017 09/17/15   [place] Lions, PA-C  diclofenac (VOLTAREN) 50 MG EC tablet Take 1 tablet (50 mg total) by mouth 2 (two) times daily. Patient not taking: Reported on 01/15/2017 06/23/16   Ashley Murrain, NP  HYDROcodone-acetaminophen (NORCO) 5-325 MG tablet Take 1 tablet by mouth every 6 (six) hours as needed. Patient not taking: Reported on 01/15/2017 06/23/16   Ashley Murrain, NP  Multiple Vitamin (MULTIVITAMIN WITH MINERALS) TABS tablet Take 1 tablet by mouth daily. Patient not taking: Reported on 01/15/2017 07/22/13   Cater, Wynelle Bourgeois, MD    Family History Family History  Problem Relation Age of Onset  . Hypertension Unknown        grandparents     Social History Social History  Substance Use Topics  . Smoking status: Current Every Day Smoker    Packs/day: 0.50    Years: 30.00  . Smokeless tobacco: Current User  . Alcohol use 3.0 oz/week    1 Glasses of wine, 4 Cans of beer per week     Comment: Drinks 2 fifths in one week (total of ~1520mL)     Allergies   Patient has no known allergies.   Review of Systems Review of Systems  Constitutional: Negative for appetite change and fatigue.  HENT: Negative for congestion, ear discharge and sinus pressure.   Eyes: Negative for discharge.  Respiratory: Negative for cough.   Cardiovascular: Positive for near-syncope. Negative for chest pain.  Gastrointestinal: Negative for abdominal pain and diarrhea.  Genitourinary: Negative for frequency and hematuria.  Musculoskeletal: Negative for back pain.  Skin: Negative for rash.  Neurological: Positive for dizziness. Negative for seizures and headaches.  Psychiatric/Behavioral: Negative for hallucinations.     Physical Exam Updated Vital Signs BP (!) 150/82   Pulse 71   Temp 98 F (36.7 C) (Oral)   Resp 15   Ht 6\' 2"  (1.88 m)   Wt 81.6 kg (180 lb)    SpO2 97%   BMI 23.11 kg/m   Physical Exam  Constitutional: He is oriented to person, place, and time. He appears well-developed.  HENT:  Head: Normocephalic.  Eyes: Conjunctivae and EOM are normal. No scleral icterus.  Neck: Neck supple. No thyromegaly present.  Cardiovascular: Normal rate and regular rhythm.  Exam reveals no gallop and no friction rub.   No murmur heard. Pulmonary/Chest: No stridor. He has no wheezes. He has no rales. He exhibits no tenderness.  Abdominal: He exhibits no distension. There is no tenderness. There is no rebound.  Musculoskeletal: Normal range of motion. He exhibits no edema.  Lymphadenopathy:    He has no cervical adenopathy.  Neurological: He is oriented to person, place, and time. He exhibits normal muscle tone. Coordination normal.  Skin: No rash noted. No erythema.  Psychiatric: He has a normal mood and affect. His behavior is normal.     ED Treatments / Results  Labs (all labs ordered are listed, but only abnormal results are displayed) Labs Reviewed  CBC - Abnormal; Notable for the following:       Result Value   Hemoglobin 12.5 (*)    HCT 37.5 (*)    Platelets 139 (*)    All other components within normal limits  ETHANOL - Abnormal; Notable for the following:    Alcohol, Ethyl (B) 152 (*)    All other components within normal limits  CBG MONITORING, ED - Abnormal; Notable for the following:    Glucose-Capillary 103 (*)    All other components within normal limits  BASIC METABOLIC PANEL  URINALYSIS, ROUTINE W REFLEX MICROSCOPIC  RAPID URINE DRUG SCREEN, HOSP PERFORMED  HEPATIC FUNCTION PANEL  I-STAT TROPONIN, ED    EKG  EKG Interpretation  Date/Time:  Tuesday January 15 2017 13:17:32 EDT Ventricular Rate:  95 PR Interval:    QRS Duration: 107 QT Interval:  369 QTC Calculation: 464 R Axis:   81 Text Interpretation:  Sinus rhythm Biatrial enlargement Left ventricular hypertrophy Anterior ST elevation, probably due to LVH  Confirmed by Milton Ferguson [phone]) on 01/15/2017 3:35:13 PM       Radiology Ct Head Wo Contrast  Result Date: 01/15/2017 CLINICAL DATA:  Dizziness. EXAM: CT HEAD WITHOUT CONTRAST TECHNIQUE: Contiguous axial images were obtained from the base of the skull through the vertex without intravenous contrast. COMPARISON:  None. FINDINGS: Brain: No acute stroke, hemorrhage, or hydrocephalus. In the RIGHT hemisphere, there is a hyperdense extra-axial mass, 37 x 36 x 39 mm (R-L x A-P x C-C), which exerts mass effect on the medial temporal lobe in the middle cranial fossa, straddles the tentorium, and extends into the posterior fossa displacing the brainstem in the prepontine cistern. The supraclinoid RIGHT internal carotid artery, which is calcified, is displaced anteriorly, possibly encased. Cavernous sinus invasion on the RIGHT  is likely. Posterior and lateral displacement of the RIGHT temporal horn, which does not appear trapped. There is slight RIGHT-to-LEFT shift, particularly at the third ventricle level, no more than 1-2 mm. 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 Schwannoma, aneurysm, or intra-axial temporal lobe tumor are all less likely. Vascular: No hyperdense vessel or unexpected calcification. Skull: Normal. Negative for fracture or focal lesion. Sinuses/Orbits: No acute finding. Other: No mastoid effusion or nasopharyngeal process. 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 Mass effect on the regional structures, including the internal carotid artery. MRI of the brain without and with contrast, along with MRA intracranial, recommended for further evaluation. Electronically Signed   By: Staci Righter M.D.   On: 01/15/2017 14:20    Procedures Procedures (including critical care time)  Medications Ordered in ED Medications  sodium chloride 0.9 % bolus 1,000 mL (0 mLs Intravenous Stopped 01/15/17 1419)      Initial Impression / Assessment and Plan / ED Course  I have reviewed the triage vital signs and the nursing notes.  Pertinent labs & imaging results that were available during my care of the patient were reviewed by me and considered in my medical decision making (see chart for details).    Patient labs were unremarkable except for alcohol level 150. CT of the head shows a 4 cm mass. We will get an MRI of his head and then consult neurosurgery  Final Clinical Impressions(s) / ED Diagnoses   Final diagnoses:  None    New Prescriptions New Prescriptions   No medications on file     Milton Ferguson, MD 01/15/17 1540

## 2017-01-15 NOTE — Discharge Instructions (Signed)
Your workup today shows evidence of a brain mass. We suspect it, with your alcohol use, is contributed to your lightheaded spells. Please decrease your alcohol use. The neurosurgery team felt you need to follow-up with him in clinic to discuss further management. Since you have done well today, we feel you're safe for discharge home at their recommendations. Please follow-up with them and if you develop any new or worsened symptoms, please return to the nearest emergency department.

## 2017-01-15 NOTE — ED Notes (Signed)
Patient transported to MRI 

## 2017-01-15 NOTE — ED Notes (Signed)
Bed: WA16 Expected date:  Expected time:  Means of arrival:  Comments: RES A 

## 2017-11-11 ENCOUNTER — Encounter (HOSPITAL_COMMUNITY): Payer: Self-pay | Admitting: *Deleted

## 2017-11-11 ENCOUNTER — Inpatient Hospital Stay (HOSPITAL_COMMUNITY)
Admission: EM | Admit: 2017-11-11 | Discharge: 2017-11-13 | DRG: 247 | Disposition: A | Discharge status: Home / Self Care | Payer: Medicaid Other | Attending: Cardiovascular Disease | Admitting: Cardiovascular Disease

## 2017-11-11 ENCOUNTER — Other Ambulatory Visit: Payer: Self-pay

## 2017-11-11 ENCOUNTER — Emergency Department (HOSPITAL_COMMUNITY): Payer: Medicaid Other

## 2017-11-11 DIAGNOSIS — I10 Essential (primary) hypertension: Secondary | ICD-10-CM | POA: Diagnosis not present

## 2017-11-11 DIAGNOSIS — Y831 Surgical operation with implant of artificial internal device as the cause of abnormal reaction of the patient, or of later complication, without mention of misadventure at the time of the procedure: Secondary | ICD-10-CM | POA: Diagnosis present

## 2017-11-11 DIAGNOSIS — F1010 Alcohol abuse, uncomplicated: Secondary | ICD-10-CM | POA: Diagnosis present

## 2017-11-11 DIAGNOSIS — T82855A Stenosis of coronary artery stent, initial encounter: Secondary | ICD-10-CM | POA: Diagnosis present

## 2017-11-11 DIAGNOSIS — I2510 Atherosclerotic heart disease of native coronary artery without angina pectoris: Secondary | ICD-10-CM | POA: Diagnosis present

## 2017-11-11 DIAGNOSIS — E7800 Pure hypercholesterolemia, unspecified: Secondary | ICD-10-CM | POA: Diagnosis present

## 2017-11-11 DIAGNOSIS — F17210 Nicotine dependence, cigarettes, uncomplicated: Secondary | ICD-10-CM | POA: Diagnosis present

## 2017-11-11 DIAGNOSIS — Z91148 Patient's other noncompliance with medication regimen for other reason: Secondary | ICD-10-CM

## 2017-11-11 DIAGNOSIS — Z9114 Patient's other noncompliance with medication regimen: Secondary | ICD-10-CM

## 2017-11-11 DIAGNOSIS — I110 Hypertensive heart disease with heart failure: Secondary | ICD-10-CM | POA: Diagnosis present

## 2017-11-11 DIAGNOSIS — Z8249 Family history of ischemic heart disease and other diseases of the circulatory system: Secondary | ICD-10-CM

## 2017-11-11 DIAGNOSIS — E785 Hyperlipidemia, unspecified: Secondary | ICD-10-CM | POA: Diagnosis present

## 2017-11-11 DIAGNOSIS — I214 Non-ST elevation (NSTEMI) myocardial infarction: Principal | ICD-10-CM | POA: Diagnosis present

## 2017-11-11 DIAGNOSIS — I5032 Chronic diastolic (congestive) heart failure: Secondary | ICD-10-CM | POA: Diagnosis present

## 2017-11-11 DIAGNOSIS — I351 Nonrheumatic aortic (valve) insufficiency: Secondary | ICD-10-CM | POA: Diagnosis not present

## 2017-11-11 DIAGNOSIS — Z955 Presence of coronary angioplasty implant and graft: Secondary | ICD-10-CM | POA: Diagnosis not present

## 2017-11-11 DIAGNOSIS — F17200 Nicotine dependence, unspecified, uncomplicated: Secondary | ICD-10-CM

## 2017-11-11 HISTORY — DX: Non-ST elevation (NSTEMI) myocardial infarction: I21.4

## 2017-11-11 HISTORY — DX: Hyperlipidemia, unspecified: E78.5

## 2017-11-11 HISTORY — DX: Neoplasm of unspecified behavior of brain: D49.6

## 2017-11-11 LAB — BASIC METABOLIC PANEL
Anion gap: 9 (ref 5–15)
BUN: 13 mg/dL (ref 6–20)
CALCIUM: 9.1 mg/dL (ref 8.9–10.3)
CO2: 25 mmol/L (ref 22–32)
CREATININE: 1.11 mg/dL (ref 0.61–1.24)
Chloride: 104 mmol/L (ref 101–111)
GFR calc Af Amer: >60 mL/min (ref >60)
Glucose, Bld: 117 mg/dL — ABNORMAL HIGH (ref 65–99)
POTASSIUM: 3.7 mmol/L (ref 3.5–5.1)
SODIUM: 138 mmol/L (ref 135–145)

## 2017-11-11 LAB — CBC
HCT: 42 % (ref 39.0–52.0)
Hemoglobin: 13.3 g/dL (ref 13.0–17.0)
MCH: 27.4 pg (ref 26.0–34.0)
MCHC: 31.7 g/dL (ref 30.0–36.0)
MCV: 86.4 fL (ref 78.0–100.0)
PLATELETS: 177 10*3/uL (ref 150–400)
RBC: 4.86 MIL/uL (ref 4.22–5.81)
RDW: 14.5 % (ref 11.5–15.5)
WBC: 5.5 10*3/uL (ref 4.0–10.5)

## 2017-11-11 LAB — I-STAT TROPONIN, ED
TROPONIN I, POC: 0.31 ng/mL — AB (ref 0.00–0.08)
Troponin i, poc: 0.41 ng/mL (ref 0.00–0.08)

## 2017-11-11 LAB — PROTIME-INR
INR: 1.04
Prothrombin Time: 13.5 seconds (ref 11.4–15.2)

## 2017-11-11 LAB — HEPARIN LEVEL (UNFRACTIONATED): Heparin Unfractionated: 0.4 IU/mL (ref 0.30–0.70)

## 2017-11-11 MED ORDER — SODIUM CHLORIDE 0.9 % WEIGHT BASED INFUSION
3.0000 mL/kg/h | INTRAVENOUS | Status: DC
  Administered 2017-11-12: 3 mL/kg/h via INTRAVENOUS

## 2017-11-11 MED ORDER — ASPIRIN EC 81 MG PO TBEC
81.0000 mg | DELAYED_RELEASE_TABLET | Freq: Every day | ORAL | Status: DC
  Administered 2017-11-12 – 2017-11-13 (×2): 81 mg via ORAL
  Filled 2017-11-11 (×2): qty 1

## 2017-11-11 MED ORDER — ONDANSETRON HCL 4 MG/2ML IJ SOLN: 4.0000 mg | Freq: Four times a day (QID) | INTRAMUSCULAR | Status: DC | PRN

## 2017-11-11 MED ORDER — NICOTINE 14 MG/24HR TD PT24
14.0000 mg | MEDICATED_PATCH | Freq: Every day | TRANSDERMAL | Status: DC
  Filled 2017-11-11 (×2): qty 1

## 2017-11-11 MED ORDER — THIAMINE HCL 100 MG/ML IJ SOLN
100.0000 mg | Freq: Every day | INTRAMUSCULAR | Status: DC
  Filled 2017-11-11: qty 1

## 2017-11-11 MED ORDER — SODIUM CHLORIDE 0.9 % IV SOLN: 250.0000 mL | INTRAVENOUS | Status: DC | PRN

## 2017-11-11 MED ORDER — FOLIC ACID 1 MG PO TABS
1.0000 mg | ORAL_TABLET | Freq: Every day | ORAL | Status: DC
  Administered 2017-11-11 – 2017-11-13 (×3): 1 mg via ORAL
  Filled 2017-11-11 (×3): qty 1

## 2017-11-11 MED ORDER — SODIUM CHLORIDE 0.9 % WEIGHT BASED INFUSION
1.0000 mL/kg/h | INTRAVENOUS | Status: DC
  Administered 2017-11-12: 1 mL/kg/h via INTRAVENOUS

## 2017-11-11 MED ORDER — NITROGLYCERIN 0.4 MG SL SUBL: 0.4000 mg | SUBLINGUAL_TABLET | SUBLINGUAL | Status: DC | PRN

## 2017-11-11 MED ORDER — LORAZEPAM 2 MG/ML IJ SOLN: 1.0000 mg | Freq: Four times a day (QID) | INTRAMUSCULAR | Status: DC | PRN

## 2017-11-11 MED ORDER — LORAZEPAM 1 MG PO TABS: 1.0000 mg | ORAL_TABLET | Freq: Four times a day (QID) | ORAL | Status: DC | PRN

## 2017-11-11 MED ORDER — VITAMIN B-1 100 MG PO TABS
100.0000 mg | ORAL_TABLET | Freq: Every day | ORAL | Status: DC
  Administered 2017-11-11 – 2017-11-13 (×3): 100 mg via ORAL
  Filled 2017-11-11 (×3): qty 1

## 2017-11-11 MED ORDER — NITROGLYCERIN 0.4 MG SL SUBL
0.4000 mg | SUBLINGUAL_TABLET | SUBLINGUAL | Status: DC | PRN
  Administered 2017-11-11: 0.4 mg via SUBLINGUAL
  Filled 2017-11-11: qty 1

## 2017-11-11 MED ORDER — SODIUM CHLORIDE 0.9% FLUSH: 3.0000 mL | INTRAVENOUS | Status: DC | PRN

## 2017-11-11 MED ORDER — ASPIRIN 81 MG PO CHEW
324.0000 mg | CHEWABLE_TABLET | Freq: Once | ORAL | Status: AC
  Administered 2017-11-11: 324 mg via ORAL
  Filled 2017-11-11: qty 4

## 2017-11-11 MED ORDER — SODIUM CHLORIDE 0.9% FLUSH
3.0000 mL | Freq: Two times a day (BID) | INTRAVENOUS | Status: DC
  Administered 2017-11-12: 3 mL via INTRAVENOUS

## 2017-11-11 MED ORDER — LORAZEPAM 1 MG PO TABS: 0.0000 mg | ORAL_TABLET | Freq: Four times a day (QID) | ORAL | Status: DC

## 2017-11-11 MED ORDER — ALPRAZOLAM 0.25 MG PO TABS
0.2500 mg | ORAL_TABLET | Freq: Two times a day (BID) | ORAL | Status: DC | PRN
  Administered 2017-11-12: 0.25 mg via ORAL
  Filled 2017-11-11: qty 1

## 2017-11-11 MED ORDER — HEPARIN (PORCINE) IN NACL 100-0.45 UNIT/ML-% IJ SOLN
1150.0000 [IU]/h | INTRAMUSCULAR | Status: DC
  Administered 2017-11-11: 1000 [IU]/h via INTRAVENOUS
  Administered 2017-11-12: 1150 [IU]/h via INTRAVENOUS
  Filled 2017-11-11 (×2): qty 250

## 2017-11-11 MED ORDER — LORAZEPAM 1 MG PO TABS: 0.0000 mg | ORAL_TABLET | Freq: Two times a day (BID) | ORAL | Status: DC

## 2017-11-11 MED ORDER — ATORVASTATIN CALCIUM 80 MG PO TABS
80.0000 mg | ORAL_TABLET | Freq: Every day | ORAL | Status: DC
  Administered 2017-11-11 – 2017-11-12 (×2): 80 mg via ORAL
  Filled 2017-11-11 (×2): qty 1

## 2017-11-11 MED ORDER — ZOLPIDEM TARTRATE 5 MG PO TABS: 5.0000 mg | ORAL_TABLET | Freq: Every evening | ORAL | Status: DC | PRN

## 2017-11-11 MED ORDER — ADULT MULTIVITAMIN W/MINERALS CH
1.0000 | ORAL_TABLET | Freq: Every day | ORAL | Status: DC
  Administered 2017-11-11 – 2017-11-13 (×3): 1 via ORAL
  Filled 2017-11-11 (×3): qty 1

## 2017-11-11 MED ORDER — NITROGLYCERIN 2 % TD OINT
1.0000 [in_us] | TOPICAL_OINTMENT | Freq: Four times a day (QID) | TRANSDERMAL | Status: DC
  Administered 2017-11-11 – 2017-11-12 (×2): 1 [in_us] via TOPICAL
  Filled 2017-11-11 (×2): qty 30

## 2017-11-11 MED ORDER — LISINOPRIL 5 MG PO TABS
5.0000 mg | ORAL_TABLET | Freq: Every day | ORAL | Status: DC
  Administered 2017-11-11 – 2017-11-12 (×2): 5 mg via ORAL
  Filled 2017-11-11 (×3): qty 1

## 2017-11-11 MED ORDER — ACETAMINOPHEN 325 MG PO TABS
650.0000 mg | ORAL_TABLET | ORAL | Status: DC | PRN
  Administered 2017-11-13: 650 mg via ORAL
  Filled 2017-11-11: qty 2

## 2017-11-11 MED ORDER — CARVEDILOL 3.125 MG PO TABS
6.2500 mg | ORAL_TABLET | Freq: Two times a day (BID) | ORAL | Status: DC
  Administered 2017-11-11 – 2017-11-13 (×4): 6.25 mg via ORAL
  Filled 2017-11-11: qty 2
  Filled 2017-11-11 (×2): qty 1
  Filled 2017-11-11: qty 2

## 2017-11-11 MED ORDER — HEPARIN BOLUS VIA INFUSION
4000.0000 [IU] | Freq: Once | INTRAVENOUS | Status: AC
  Administered 2017-11-11: 4000 [IU] via INTRAVENOUS
  Filled 2017-11-11: qty 4000

## 2017-11-11 MED ORDER — SODIUM CHLORIDE 0.9% FLUSH: 3.0000 mL | Freq: Two times a day (BID) | INTRAVENOUS | Status: DC

## 2017-11-11 NOTE — ED Provider Notes (Signed)
Emergency Department Provider Note   I have reviewed the triage vital signs and the nursing notes.   HISTORY  Chief Complaint Chest Pain   HPI [name] [name] is a 62 y.o. male with PMH of HLD, HTN, and tobacco use presents to the emergency department with intermittent chest pain over the past week.  Patient states that the pain is central chest and feels like it discomfort/pressure.  No radiation of symptoms.  Denies any shortness of breath.  He does feel slightly more chest pain with exertion.  He denies any cocaine or other drug use.  He did drink a small amount of alcohol prior to ED presentation.  He denies any nausea or diaphoresis.  He is not compliant with his home medications for high cholesterol and high blood pressure.  The patient's chart reports a history of coronary artery disease but the patient states he does not believe he has had a heart attack before.    Past Medical History:  Diagnosis Date  . Coronary artery disease    s/p stent   . High cholesterol   . HTN (hypertension)   . Smoker    1/2 ppd x >30 years     Patient Active Problem List   Diagnosis Date Noted  . SOB (shortness of breath) on exertion 07/21/2013  . Dizziness 07/21/2013  . HTN (hypertension) 07/21/2013  . Tobacco abuse 07/21/2013  . Alcohol abuse 07/21/2013  . CAD (coronary artery disease) 07/21/2013  . H/O medication noncompliance 07/21/2013  . HLD (hyperlipidemia) 07/21/2013    Past Surgical History:  Procedure Laterality Date  . CORONARY ANGIOPLASTY WITH STENT PLACEMENT Left 2011    Current Outpatient Rx  . Order #: [id number] Class: Normal  . Order #: [id number] Class: Print  . Order #: [id number] Class: Print  . Order #: [id number] Class: Print  . Order #: [id number] Class: Normal  . Order #: [id number] Class: Normal  . Order #: [id number] Class: Normal    Allergies Patient has no known allergies.  Family History  Problem Relation Age of Onset  . Hypertension Unknown    grandparents     Social History Social History   Tobacco Use  . Smoking status: Current Every Day Smoker    Packs/day: 0.50    Years: 30.00    Pack years: 15.00  . Smokeless tobacco: Current User  Substance Use Topics  . Alcohol use: Yes    Alcohol/week: 3.0 oz    Types: 1 Glasses of wine, 4 Cans of beer per week    Comment: Drinks 2 fifths in one week (total of ~151mL)  . Drug use: Yes    Types: Marijuana    Review of Systems  Constitutional: No fever/chills Eyes: No visual changes. ENT: No sore throat. Cardiovascular: Positive chest pain. Respiratory: Denies shortness of breath. Gastrointestinal: No abdominal pain.  No nausea, no vomiting.  No diarrhea.  No constipation. Genitourinary: Negative for dysuria. Musculoskeletal: Negative for back pain. Skin: Negative for rash. Neurological: Negative for headaches, focal weakness or numbness.  10-point ROS otherwise negative.  ____________________________________________   PHYSICAL EXAM:  VITAL SIGNS: ED Triage Vitals  Enc Vitals Group     BP 11/11/17 1052 (!) 180/103     Pulse Rate 11/11/17 1052 97     Resp 11/11/17 1052 18     Temp 11/11/17 1052 98.8 F (37.1 C)     Temp Source 11/11/17 1052 Oral     SpO2 11/11/17 1052 100 %     Pain Score 11/11/17 1053 7  Constitutional: Alert and oriented. Well appearing and in no acute distress. Eyes: Conjunctivae are normal.  Head: Atraumatic. Nose: No congestion/rhinnorhea. Mouth/Throat: Mucous membranes are moist. Neck: No stridor.  Cardiovascular: Normal rate, regular rhythm. Good peripheral circulation. Grossly normal heart sounds.   Respiratory: Normal respiratory effort.  No retractions. Lungs CTAB. Gastrointestinal: Soft and nontender. No distention.  Musculoskeletal: No lower extremity tenderness nor edema. No gross deformities of extremities. Neurologic:  Normal speech and language. No gross focal neurologic deficits are appreciated.  Skin:  Skin is warm,  dry and intact. No rash noted.  ____________________________________________   LABS (all labs ordered are listed, but only abnormal results are displayed)  Labs Reviewed  BASIC METABOLIC PANEL - Abnormal; Notable for the following components:      Result Value   Glucose, Bld 117 (*)    All other components within normal limits  I-STAT TROPONIN, ED - Abnormal; Notable for the following components:   Troponin i, poc 0.31 (*)    All other components within normal limits  CBC  HEPARIN LEVEL (UNFRACTIONATED)  I-STAT TROPONIN, ED   ____________________________________________  EKG   EKG Interpretation  Date/Time:  Monday Nov 11 2017 10:54:31 EDT Ventricular Rate:  96 PR Interval:  194 QRS Duration: 88 QT Interval:  356 QTC Calculation: 449 R Axis:   88 Text Interpretation:  Normal sinus rhythm Right atrial enlargement Left ventricular hypertrophy with repolarization abnormality Cannot rule out Inferior infarct , age undetermined Abnormal ECG No STEMI.  Confirmed by Nanda Quinton [phone]) on 11/11/2017 11:23:57 AM       ____________________________________________  RADIOLOGY  Dg Chest 2 View  Result Date: 11/11/2017 CLINICAL DATA:  Chest pain, shortness of breath EXAM: CHEST - 2 VIEW COMPARISON:  08/17/2015 FINDINGS: Lungs are clear.  No pleural effusion or pneumothorax. The heart is normal in size. Visualized osseous structures are within normal limits. IMPRESSION: Normal chest radiographs. Electronically Signed   By: Julian Hy M.D.   On: 11/11/2017 12:26    ____________________________________________   PROCEDURES  Procedure(s) performed:   .Critical Care Performed by: Margette Fast, MD Authorized by: Margette Fast, MD   Critical care provider statement:    Critical care time (minutes):  35   Critical care time was exclusive of:  Separately billable procedures and treating other patients and teaching time   Critical care was necessary to treat or prevent  imminent or life-threatening deterioration of the following conditions:  Cardiac failure   Critical care was time spent personally by me on the following activities:  Blood draw for specimens, development of treatment plan with patient or surrogate, discussions with consultants, evaluation of patient's response to treatment, examination of patient, obtaining history from patient or surrogate, ordering and performing treatments and interventions, ordering and review of laboratory studies, ordering and review of radiographic studies, pulse oximetry, re-evaluation of patient's condition and review of old charts   I assumed direction of critical care for this patient from another provider in my specialty: no     ____________________________________________   INITIAL IMPRESSION / [place] / ED COURSE  Pertinent labs & imaging results that were available during my care of the patient were reviewed by me and considered in my medical decision making (see chart for details).  Patient presents to the emergency department for evaluation of chest pain.  The patient's troponin is elevated.  He has risk factors including smoking history along with hypertension and hyperlipidemia.  Patient has coronary artery disease with stents listed  in his chart but does not recall this specifically.  Remaining labs and CXR reviewed with no acute changes. Will start heparin and admit to cards service.   Discussed patient's case with Cardiology to request admission. Patient and family (if present) updated with plan. Care transferred to Cardiology service.  I reviewed all nursing notes, vitals, pertinent old records, EKGs, labs, imaging (as available).  ____________________________________________  FINAL CLINICAL IMPRESSION(S) / ED DIAGNOSES  Final diagnoses:  NSTEMI (non-ST elevated myocardial infarction) ([place])     MEDICATIONS GIVEN DURING THIS VISIT:  Medications  nitroGLYCERIN (NITROSTAT) SL tablet  0.4 mg (0.4 mg Sublingual Given 11/11/17 1154)  heparin ADULT infusion 100 units/mL (25000 units/259mL sodium chloride 0.45%) (1,000 Units/hr Intravenous New Bag/Given 11/11/17 1323)  aspirin chewable tablet 324 mg (324 mg Oral Given 11/11/17 1153)  heparin bolus via infusion 4,000 Units (4,000 Units Intravenous Bolus from Bag 11/11/17 1322)    Note:  This document was prepared using Dragon voice recognition software and may include unintentional dictation errors.  Nanda Quinton, MD Emergency Medicine    Long, Wonda Old[facility], MD 11/11/17 1536

## 2017-11-11 NOTE — H&P (Addendum)
Cardiology History and Physical:   Patient ID: [name] [name]; [id number]; 1955-09-09   Admit date: 11/11/2017 Date of Consult: 11/11/2017  Primary Care Provider: Marliss Coots, NP Primary Cardiologist: New, Dr Gwenlyn Found Primary Electrophysiologist:  n/a   Patient Profile:   [name] [name] is a 62 y.o. male with a hx of HTN, HLD, CAD w/ LAD BMS stent 2011 in [place], who is being seen today for the evaluation of chest pain.  History of Present Illness:   [name] was not taking any medications prior to admission. He has never taken rx consistently. After his stent in [place], he does not remember taking Plavix, admits he probably did not.   He developed chest pain about a week ago. He has had it intermittently since then. Consistently with exertion, and sometimes at rest. No rx tried. Pressure, 3-4/10, worse with increased exertion. No radiation. Relieved by rest in about 5-10 minutes. No N&V or diaphoresis but does get a little SOB. The SOB as well as the CP reminds him of pre-PCI symptoms.   Activity level at baseline is low. He does not exercise.   He drinks a fifth about every other day, has had sx from ETOH withdrawal before. Continues to smoke.    Past Medical History:  Diagnosis Date  . Coronary artery disease 2011   s/p BMS stent LAD, [facility], [facility] [place], [place], Dr Chauncey Cruel. [name]  . High cholesterol   . HTN (hypertension)   . Hyperlipidemia LDL goal <70 07/21/2013  . NSTEMI (non-ST elevated myocardial infarction) ([place]) 11/11/2017  . Smoker    1/2 ppd x >40 years     Past Surgical History:  Procedure Laterality Date  . CORONARY ANGIOPLASTY WITH STENT PLACEMENT Left 2011   BMS LAD, [facility], [facility] [place], [place], Dr Chauncey Cruel. [name]     Prior to Admission medications **NOT TAKING**  Medication Sig Start Date End Date Taking? Authorizing Provider  aspirin 81 MG EC tablet Take 1 tablet (81 mg total) by mouth daily. Patient not taking: Reported on 01/15/2017  07/22/13   Cater, Wynelle Bourgeois, MD  clindamycin (CLEOCIN) 150 MG capsule Take 3 capsules (450 mg total) by mouth every 6 (six) hours. Patient not taking: Reported on 01/15/2017 09/17/15   Tonopah Lions, PA-C  diclofenac (VOLTAREN) 50 MG EC tablet Take 1 tablet (50 mg total) by mouth 2 (two) times daily. Patient not taking: Reported on 01/15/2017 06/23/16   Ashley Murrain, NP  HYDROcodone-acetaminophen (NORCO) 5-325 MG tablet Take 1 tablet by mouth every 6 (six) hours as needed. Patient not taking: Reported on 01/15/2017 06/23/16   Ashley Murrain, NP  lisinopril (PRINIVIL,ZESTRIL) 10 MG tablet Take 1 tablet (10 mg total) by mouth daily. Patient not taking: Reported on 11/11/2017 07/22/13   Cater, Wynelle Bourgeois, MD  lovastatin (MEVACOR) 20 MG tablet Take 2 tablets (40 mg total) by mouth at bedtime. Patient not taking: Reported on 11/11/2017 07/22/13   Cater, Wynelle Bourgeois, MD  Multiple Vitamin (MULTIVITAMIN WITH MINERALS) TABS tablet Take 1 tablet by mouth daily. Patient not taking: Reported on 01/15/2017 07/22/13   Pollie Friar, MD    Inpatient Medications: Scheduled Meds:  Continuous Infusions: . heparin 1,000 Units/hr (11/11/17 1323)   PRN Meds: nitroGLYCERIN  Allergies:   No Known Allergies  Social History:   Social History   Socioeconomic History  . Marital status: Single    Spouse name: Not on file  . Number of children: Not on file  . Years  of education: Not on file  . Highest education level: Not on file  Occupational History  . Occupation: unemployed  Social Needs  . Financial resource strain: Not on file  . Food insecurity:    Worry: Not on file    Inability: Not on file  . Transportation needs:    Medical: Not on file    Non-medical: Not on file  Tobacco Use  . Smoking status: Current Every Day Smoker    Packs/day: 0.50    Years: 45.00    Pack years: 22.50  . Smokeless tobacco: Current User  Substance and Sexual Activity  . Alcohol use: Yes    Alcohol/week: 45.0 oz     Types: 75 Shots of liquor per week    Comment: a fifth every other day, average 3/week  . Drug use: Yes    Types: Marijuana    Comment: occasionally  . Sexual activity: Yes  Lifestyle  . Physical activity:    Days per week: Not on file    Minutes per session: Not on file  . Stress: Not on file  Relationships  . Social connections:    Talks on phone: Not on file    Gets together: Not on file    Attends religious service: Not on file    Active member of club or organization: Not on file    Attends meetings of clubs or organizations: Not on file    Relationship status: Not on file  . Intimate partner violence:    Fear of current or ex partner: Not on file    Emotionally abused: Not on file    Physically abused: Not on file    Forced sexual activity: Not on file  Other Topics Concern  . Not on file  Social History Narrative   From [place]    Family lives in [place] (brother, sister and mother)    Unemployed    Drinks 3 (1/5)ths in 39 week    Family History:   Family History  Problem Relation Age of Onset  . Diabetes Paternal Grandfather   . Hypertension Paternal Grandfather    Family Status:  Family Status  Relation Name Status  . Mother  Alive  . Father  Alive  . Sister  Alive  . Brother  Alive  . PGM  Deceased  . PGF  Deceased    ROS:  Please see the history of present illness.  All other ROS reviewed and negative.     Physical Exam/Data:   Vitals:   11/11/17 1415 11/11/17 1430 11/11/17 1445 11/11/17 1500  BP: 124/74 (!) 177/87 (!) 154/85 (!) 144/77  Pulse: 69 85 74 71  Resp: 17 (!) 23 14 16   Temp:      TempSrc:      SpO2: 98% 100% 99% 100%  Weight:      Height:       No intake or output data in the 24 hours ending 11/11/17 1542 Filed Weights   11/11/17 1200  Weight: 180 lb (81.6 kg)   Body mass index is 23.11 kg/m.  General:  Well nourished, well developed, in no acute distress HEENT: normal Lymph: no adenopathy Neck: no JVD Endocrine:   No thryomegaly Vascular: No carotid bruits; 4/4 extremity pulses 2+, without bruits  Cardiac:  normal S1, S2; RRR; no murmur  Lungs:  clear to auscultation bilaterally, no wheezing, rhonchi or rales  Abd: soft, nontender, no hepatomegaly  Ext: no edema Musculoskeletal:  No deformities, BUE and  BLE strength normal and equal Skin: warm and dry  Neuro:  CNs 2-12 intact, no focal abnormalities noted Psych:  Normal affect   EKG:  The EKG was personally reviewed and demonstrates:  05/20, SR, HR 96, LVH, repol abnl, ST/T wave changes inferior leads from 12/2016 Telemetry:  Telemetry was personally reviewed and demonstrates:  SR  Relevant CV Studies:  ECHO: 07/21/2013 - Left ventricle: The cavity size was normal. Systolic function was normal. The estimated ejection fraction was in the range of 60% to 65%. Wall motion was normal; there were no regional wall motion abnormalities. Doppler parameters are consistent with abnormal left ventricular relaxation (grade 1 diastolic dysfunction). - Aortic valve: Trileaflet; normal thickness leaflets. No regurgitation. - Mitral valve: Structurally normal valve. Mild regurgitation. - Left atrium: The atrium was normal in size. - Right ventricle: The cavity size was normal. Wall thickness was normal. Systolic function was normal. - Right atrium: The atrium was normal in size. - Tricuspid valve: No regurgitation. - Pulmonary arteries: Systolic pressure was within the normal range. - Pericardium, extracardiac: There was no pericardial effusion.  Laboratory Data:  Chemistry Recent Labs  Lab 11/11/17 1057  NA 138  K 3.7  CL 104  CO2 25  GLUCOSE 117*  BUN 13  CREATININE 1.11  CALCIUM 9.1  GFRNONAA >60  GFRAA >60  ANIONGAP 9    Lab Results  Component Value Date   ALT 19 01/15/2017   AST 28 01/15/2017   ALKPHOS 49 01/15/2017   BILITOT 0.5 01/15/2017   Hematology Recent Labs  Lab 11/11/17 1057  WBC 5.5  RBC  4.86  HGB 13.3  HCT 42.0  MCV 86.4  MCH 27.4  MCHC 31.7  RDW 14.5  PLT 177   Cardiac EnzymesNo results for input(s): TROPONINI in the last 168 hours.  Recent Labs  Lab 11/11/17 1105 11/11/17 1510  TROPIPOC 0.31* 0.41*    TSH: No results found for: TSH Lipids: Lab Results  Component Value Date   CHOL 231 (H) 07/21/2013   HDL 60 07/21/2013   LDLCALC 144 (H) 07/21/2013   TRIG 134 07/21/2013   CHOLHDL 3.9 07/21/2013   HgbA1c: Lab Results  Component Value Date   HGBA1C 5.8 (H) 07/21/2013   Magnesium:  Magnesium  Date Value Ref Range Status  07/21/2013 2.0 1.5 - 2.5 mg/dL Final     Radiology/Studies:  Dg Chest 2 View  Result Date: 11/11/2017 CLINICAL DATA:  Chest pain, shortness of breath EXAM: CHEST - 2 VIEW COMPARISON:  08/17/2015 FINDINGS: Lungs are clear.  No pleural effusion or pneumothorax. The heart is normal in size. Visualized osseous structures are within normal limits. IMPRESSION: Normal chest radiographs. Electronically Signed   By: Julian Hy M.D.   On: 11/11/2017 12:26    Assessment and Plan:   Principal Problem: 1.  NSTEMI (non-ST elevated myocardial infarction) ([place]) - Admit, continue to cycle enzymes - add heparin, nitro paste, ASA, BB, high dose statin - ck echo - cath am, on for Dr Ellyn Hack 10:30  Active Problems: 2.  HTN (hypertension) - has never consistently taken rx - admits he needs to make some changes - add BB, amlodipine and follow  3.  Alcohol abuse - prn Ativan  4.  H/O medication noncompliance - compliance encouraged, needs to follow up w/ PCP regularly  For questions or updates, please contact [place] Please consult [email or web address] for contact info under Cardiology/STEMI.   Signed, Rosaria Ferries, PA-C  11/11/2017 3:42 PM  Agree with note by  Rosaria Ferries PA-C  Mr. Gust has a history of bare-metal stenting at [facility] in [place] approximately 8 years ago.  He has a history of ongoing tobacco  abuse, alcohol abuse and medication noncompliance.  He has been having exertional chest pain recently similar to his prior symptoms and presents today with worsening chest pain.  He is currently pain-free.  His EKG shows LVH with repolarization changes and inferolateral T wave inversion more pronounced compared to his prior EKG.  Troponins are minimally positive at 0.4.  His exam is benign.  He will need diagnostic coronary angiography tomorrow.  I am going to continue his IV heparin and treat him with topical nitrates as well as his hypertension with beta-blocker and ACE inhibitor.  His renal function is stable. The patient understands that risks included but are not limited to stroke (1 in 1000), death (1 in 82), kidney failure [usually temporary] (1 in 500), bleeding (1 in 200), allergic reaction [possibly serious] (1 in 200). The patient understands and agrees to proceed  Lorretta Harp, M.D., [place], [facility], [place], [place] [address]. [place], [place]  [address]  [phone] 11/11/2017 4:06 PM

## 2017-11-11 NOTE — Progress Notes (Signed)
ANTICOAGULATION CONSULT NOTE  Pharmacy Consult:  Heparin Indication: chest pain/ACS  No Known Allergies  Patient Measurements: Height: 6\' 2"  (188 cm) Weight: 177 lb 8 oz (80.5 kg) IBW/kg (Calculated) : 82.2 Heparin Dosing Weight: 82 kg  Vital Signs: Temp: 98.6 F (37 C) (05/20 2010) Temp Source: Oral (05/20 2010) BP: 194/103 (05/20 2010) Pulse Rate: 61 (05/20 2010)  Labs: Recent Labs    11/11/17 1057 11/11/17 1158 11/11/17 2000  HGB 13.3  --   --   HCT 42.0  --   --   PLT 177  --   --   LABPROT  --  13.5  --   INR  --  1.04  --   HEPARINUNFRC  --   --  0.40  CREATININE 1.11  --   --     Estimated Creatinine Clearance: 80.6 mL/min (by C-G formula based on SCr of 1.11 mg/dL).   Assessment: 24 YOM continues on IV heparin for ACS.  Heparin level is therapeutic; no bleeding reported.   Goal of Therapy:  Heparin level 0.3-0.7 units/ml Monitor platelets by anticoagulation protocol: Yes    Plan:  Continue heparin gtt at 1000 units/hr F/U AM labs   Ena Demary D. Mina Marble, PharmD, BCPS, [place] Pager:  [phone] 11/11/2017, 9:09 PM

## 2017-11-11 NOTE — ED Notes (Signed)
Called lab to add on lab work 

## 2017-11-11 NOTE — ED Notes (Signed)
Pt transported to Imaging  

## 2017-11-11 NOTE — ED Triage Notes (Signed)
Pt is here with some pressure that started one week ago and was intermittent with walking. Pt states nonradiating mid chest pressure. Pt reports some shortness of breath

## 2017-11-11 NOTE — Progress Notes (Signed)
ANTICOAGULATION CONSULT NOTE - Initial Consult  Pharmacy Consult for heparin Indication: chest pain/ACS  No Known Allergies  Patient Measurements: Height: 6\' 2"  (188 cm) Weight: 180 lb (81.6 kg) IBW/kg (Calculated) : 82.2 Heparin Dosing Weight: 81.6kg  Vital Signs: Temp: 98.8 F (37.1 C) (05/20 1052) Temp Source: Oral (05/20 1052) BP: 160/90 (05/20 1157) Pulse Rate: 75 (05/20 1157)  Labs: Recent Labs    11/11/17 1057  HGB 13.3  HCT 42.0  PLT 177  CREATININE 1.11    Estimated Creatinine Clearance: 81.7 mL/min (by C-G formula based on SCr of 1.11 mg/dL).   Medical History: Past Medical History:  Diagnosis Date  . Coronary artery disease    s/p stent   . High cholesterol   . HTN (hypertension)   . Smoker    1/2 ppd x >30 years     Medications:  Infusions:  . heparin      Assessment: 64 yom presented to the ED with CP. Troponin elevated and now starting IV heparin. Baseline CBC is WNL and he is not on anticoagulation PTA.   Goal of Therapy:  Heparin level 0.3-0.7 units/ml Monitor platelets by anticoagulation protocol: Yes   Plan:  Heparin bolus 4000 units IV x 1 Heparin gtt 1000 units/hr Check a 6 hr heparin level Daily heparin level and CBC  Mailey Landstrom, Rande Lawman 11/11/2017,12:39 PM

## 2017-11-11 NOTE — ED Notes (Addendum)
[place], Julianne Rice, Blue top blood work held in [employer] (no orders yet-- will send to [employer] when orders filled); [employer] top put on rocker in [employer].

## 2017-11-11 NOTE — ED Notes (Signed)
Report attempted x 1

## 2017-11-11 NOTE — ED Notes (Signed)
Pt placed on monitor and pulse ox iv started and given meds

## 2017-11-12 ENCOUNTER — Inpatient Hospital Stay (HOSPITAL_COMMUNITY): Payer: Medicaid Other

## 2017-11-12 ENCOUNTER — Encounter (HOSPITAL_COMMUNITY)
Admission: EM | Disposition: A | Discharge status: Home / Self Care | Payer: Self-pay | Source: Home / Self Care | Attending: Cardiovascular Disease

## 2017-11-12 DIAGNOSIS — I2510 Atherosclerotic heart disease of native coronary artery without angina pectoris: Secondary | ICD-10-CM

## 2017-11-12 DIAGNOSIS — I351 Nonrheumatic aortic (valve) insufficiency: Secondary | ICD-10-CM

## 2017-11-12 HISTORY — PX: LEFT HEART CATH AND CORONARY ANGIOGRAPHY: CATH118249

## 2017-11-12 HISTORY — PX: CORONARY STENT INTERVENTION: CATH118234

## 2017-11-12 LAB — CBC
HEMATOCRIT: 37.9 % — AB (ref 39.0–52.0)
HEMOGLOBIN: 12 g/dL — AB (ref 13.0–17.0)
MCH: 27.8 pg (ref 26.0–34.0)
MCHC: 31.7 g/dL (ref 30.0–36.0)
MCV: 87.9 fL (ref 78.0–100.0)
Platelets: 150 10*3/uL (ref 150–400)
RBC: 4.31 MIL/uL (ref 4.22–5.81)
RDW: 14.6 % (ref 11.5–15.5)
WBC: 4.8 10*3/uL (ref 4.0–10.5)

## 2017-11-12 LAB — ECHOCARDIOGRAM COMPLETE
Height: 74 in
Weight: 2755.2 oz

## 2017-11-12 LAB — COMPREHENSIVE METABOLIC PANEL
ALBUMIN: 3.5 g/dL (ref 3.5–5.0)
ALK PHOS: 52 U/L (ref 38–126)
ALT: 14 U/L — AB (ref 17–63)
AST: 18 U/L (ref 15–41)
Anion gap: 8 (ref 5–15)
BUN: 15 mg/dL (ref 6–20)
CALCIUM: 8.8 mg/dL — AB (ref 8.9–10.3)
CO2: 24 mmol/L (ref 22–32)
CREATININE: 1.02 mg/dL (ref 0.61–1.24)
Chloride: 105 mmol/L (ref 101–111)
GFR calc Af Amer: >60 mL/min (ref >60)
GFR calc non Af Amer: >60 mL/min (ref >60)
GLUCOSE: 95 mg/dL (ref 65–99)
Potassium: 3.9 mmol/L (ref 3.5–5.1)
SODIUM: 137 mmol/L (ref 135–145)
Total Bilirubin: 0.5 mg/dL (ref 0.3–1.2)
Total Protein: 6.5 g/dL (ref 6.5–8.1)

## 2017-11-12 LAB — LIPID PANEL
Cholesterol: 175 mg/dL (ref 0–200)
HDL: 42 mg/dL (ref >40)
LDL Cholesterol: 114 mg/dL — ABNORMAL HIGH (ref 0–99)
TRIGLYCERIDES: 93 mg/dL (ref <150)
Total CHOL/HDL Ratio: 4.2 RATIO
VLDL: 19 mg/dL (ref 0–40)

## 2017-11-12 LAB — HEPARIN LEVEL (UNFRACTIONATED): HEPARIN UNFRACTIONATED: 0.25 [IU]/mL — AB (ref 0.30–0.70)

## 2017-11-12 LAB — TSH: TSH: 1.283 u[IU]/mL (ref 0.350–4.500)

## 2017-11-12 LAB — HIV ANTIBODY (ROUTINE TESTING W REFLEX): HIV Screen 4th Generation wRfx: NONREACTIVE

## 2017-11-12 LAB — POCT ACTIVATED CLOTTING TIME
Activated Clotting Time: 268 seconds
Activated Clotting Time: 274 seconds

## 2017-11-12 SURGERY — LEFT HEART CATH AND CORONARY ANGIOGRAPHY
Anesthesia: LOCAL

## 2017-11-12 MED ORDER — NITROGLYCERIN IN D5W 200-5 MCG/ML-% IV SOLN: 5.0000 ug/min | INTRAVENOUS | Status: DC

## 2017-11-12 MED ORDER — VERAPAMIL HCL 2.5 MG/ML IV SOLN
INTRAVENOUS | Status: AC
  Filled 2017-11-12: qty 2

## 2017-11-12 MED ORDER — MIDAZOLAM HCL 2 MG/2ML IJ SOLN
INTRAMUSCULAR | Status: DC | PRN
  Administered 2017-11-12: 2 mg via INTRAVENOUS

## 2017-11-12 MED ORDER — NITROGLYCERIN IN D5W 200-5 MCG/ML-% IV SOLN
INTRAVENOUS | Status: AC
  Filled 2017-11-12: qty 250

## 2017-11-12 MED ORDER — LIDOCAINE HCL (PF) 1 % IJ SOLN
INTRAMUSCULAR | Status: DC | PRN
  Administered 2017-11-12: 2 mL via INTRADERMAL

## 2017-11-12 MED ORDER — SODIUM CHLORIDE 0.9% FLUSH
3.0000 mL | Freq: Two times a day (BID) | INTRAVENOUS | Status: DC
  Administered 2017-11-13: 3 mL via INTRAVENOUS

## 2017-11-12 MED ORDER — TICAGRELOR 90 MG PO TABS
90.0000 mg | ORAL_TABLET | Freq: Two times a day (BID) | ORAL | Status: DC
  Administered 2017-11-13 (×2): 90 mg via ORAL
  Filled 2017-11-12 (×2): qty 1

## 2017-11-12 MED ORDER — TICAGRELOR 90 MG PO TABS
ORAL_TABLET | ORAL | Status: AC
  Filled 2017-11-12: qty 1

## 2017-11-12 MED ORDER — LIDOCAINE HCL (PF) 1 % IJ SOLN
INTRAMUSCULAR | Status: AC
  Filled 2017-11-12: qty 30

## 2017-11-12 MED ORDER — FENTANYL CITRATE (PF) 100 MCG/2ML IJ SOLN
INTRAMUSCULAR | Status: DC | PRN
  Administered 2017-11-12 (×2): 25 ug via INTRAVENOUS
  Administered 2017-11-12: 50 ug via INTRAVENOUS

## 2017-11-12 MED ORDER — HEPARIN SODIUM (PORCINE) 1000 UNIT/ML IJ SOLN
INTRAMUSCULAR | Status: AC
  Filled 2017-11-12: qty 1

## 2017-11-12 MED ORDER — THE SENSUOUS HEART BOOK
Freq: Once | Status: AC
  Administered 2017-11-12: 1
  Filled 2017-11-12: qty 1

## 2017-11-12 MED ORDER — LABETALOL HCL 5 MG/ML IV SOLN: 10.0000 mg | INTRAVENOUS | Status: AC | PRN

## 2017-11-12 MED ORDER — HEPARIN SODIUM (PORCINE) 1000 UNIT/ML IJ SOLN
INTRAMUSCULAR | Status: DC | PRN
  Administered 2017-11-12 (×2): 4000 [IU] via INTRAVENOUS
  Administered 2017-11-12: 3000 [IU] via INTRAVENOUS

## 2017-11-12 MED ORDER — MIDAZOLAM HCL 2 MG/2ML IJ SOLN
INTRAMUSCULAR | Status: AC
  Filled 2017-11-12: qty 2

## 2017-11-12 MED ORDER — VERAPAMIL HCL 2.5 MG/ML IV SOLN
INTRAVENOUS | Status: DC | PRN
  Administered 2017-11-12: 10 mL via INTRA_ARTERIAL

## 2017-11-12 MED ORDER — SODIUM CHLORIDE 0.9 % IV SOLN
250.0000 mL | INTRAVENOUS | Status: DC | PRN
  Administered 2017-11-12: 18:00:00 via INTRAVENOUS

## 2017-11-12 MED ORDER — SODIUM CHLORIDE 0.9% FLUSH: 3.0000 mL | INTRAVENOUS | Status: DC | PRN

## 2017-11-12 MED ORDER — NITROGLYCERIN IN D5W 200-5 MCG/ML-% IV SOLN
INTRAVENOUS | Status: AC | PRN
  Administered 2017-11-12: 10 ug/min via INTRAVENOUS

## 2017-11-12 MED ORDER — HEPARIN (PORCINE) IN NACL 2-0.9 UNITS/ML
INTRAMUSCULAR | Status: AC | PRN
  Administered 2017-11-12 (×2): 500 mL via INTRA_ARTERIAL

## 2017-11-12 MED ORDER — IOHEXOL 350 MG/ML SOLN
INTRAVENOUS | Status: DC | PRN
  Administered 2017-11-12: 130 mL via INTRA_ARTERIAL

## 2017-11-12 MED ORDER — HYDRALAZINE HCL 20 MG/ML IJ SOLN
INTRAMUSCULAR | Status: AC
  Filled 2017-11-12: qty 1

## 2017-11-12 MED ORDER — ACTIVE PARTNERSHIP FOR HEALTH OF YOUR HEART BOOK
Freq: Once | Status: DC
  Filled 2017-11-12 (×2): qty 1

## 2017-11-12 MED ORDER — SODIUM CHLORIDE 0.9 % IV SOLN
INTRAVENOUS | Status: AC
  Administered 2017-11-12: 14:00:00 via INTRAVENOUS

## 2017-11-12 MED ORDER — TICAGRELOR 90 MG PO TABS
ORAL_TABLET | ORAL | Status: DC | PRN
  Administered 2017-11-12: 180 mg via ORAL

## 2017-11-12 MED ORDER — ANGIOPLASTY BOOK
Freq: Once | Status: AC
  Administered 2017-11-12: 23:00:00
  Filled 2017-11-12: qty 1

## 2017-11-12 MED ORDER — HYDRALAZINE HCL 20 MG/ML IJ SOLN
INTRAMUSCULAR | Status: DC | PRN
  Administered 2017-11-12: 10 mg via INTRAVENOUS

## 2017-11-12 MED ORDER — HEART ATTACK BOUNCING BOOK
Freq: Once | Status: AC
  Administered 2017-11-12: 23:00:00 1
  Filled 2017-11-12: qty 1

## 2017-11-12 MED ORDER — TICAGRELOR 90 MG PO TABS
ORAL_TABLET | ORAL | Status: AC
  Filled 2017-11-12: qty 2

## 2017-11-12 MED ORDER — FENTANYL CITRATE (PF) 100 MCG/2ML IJ SOLN
INTRAMUSCULAR | Status: AC
  Filled 2017-11-12: qty 2

## 2017-11-12 MED ORDER — HEPARIN (PORCINE) IN NACL 1000-0.9 UT/500ML-% IV SOLN
INTRAVENOUS | Status: AC
  Filled 2017-11-12: qty 1000

## 2017-11-12 SURGICAL SUPPLY — 17 items
BALLN EMERGE MR 2.0X15 (BALLOONS) ×2
BALLN SAPPHIRE ~~LOC~~ 3.0X18 (BALLOONS) ×2 IMPLANT
BALLOON EMERGE MR 2.0X15 (BALLOONS) ×1 IMPLANT
CATH OPTITORQUE TIG 4.0 5F (CATHETERS) ×2 IMPLANT
CATH VISTA GUIDE 6FR XB3.5 (CATHETERS) ×2 IMPLANT
DEVICE RAD COMP TR BAND LRG (VASCULAR PRODUCTS) ×2 IMPLANT
GLIDESHEATH SLEND A-KIT 6F 22G (SHEATH) ×2 IMPLANT
GUIDEWIRE INQWIRE 1.5J.035X260 (WIRE) ×1 IMPLANT
INQWIRE 1.5J .035X260CM (WIRE) ×2
KIT ENCORE 26 ADVANTAGE (KITS) ×2 IMPLANT
KIT HEART LEFT (KITS) ×2 IMPLANT
KIT HEMO VALVE WATCHDOG (MISCELLANEOUS) ×2 IMPLANT
PACK CARDIAC CATHETERIZATION (CUSTOM PROCEDURE TRAY) ×2 IMPLANT
STENT SYNERGY DES 2.5X24 (Permanent Stent) ×4 IMPLANT
TRANSDUCER W/STOPCOCK (MISCELLANEOUS) ×2 IMPLANT
TUBING CIL FLEX 10 FLL-RA (TUBING) ×2 IMPLANT
WIRE ASAHI PROWATER 180CM (WIRE) ×2 IMPLANT

## 2017-11-12 NOTE — Plan of Care (Signed)
  Problem: Clinical Measurements: Goal: Ability to maintain clinical measurements within normal limits will improve Outcome: Progressing Note:  Pt hypertensive on arrival to unit; BP improving with meds. Goal: Respiratory complications will improve Outcome: Progressing Note:  No s/s of respiratory complications. Goal: Cardiovascular complication will be avoided Outcome: Progressing Note:  No s/s of cardiovascular complications.   Problem: Pain Managment: Goal: General experience of comfort will improve Outcome: Progressing Note:  Denies c/o pain or discomfort.  No c/o CP or SOB.   Problem: Education: Goal: Understanding of cardiac disease, CV risk reduction, and recovery process will improve Outcome: Progressing Note:  Pt states he needs to find a primary care MD, and he needs to start taking his meds at home.

## 2017-11-12 NOTE — Progress Notes (Signed)
  Echocardiogram 2D Echocardiogram has been performed.  [name] [name] 11/12/2017, 10:49 AM

## 2017-11-12 NOTE — Care Management Note (Signed)
Case Management Note  Patient Details  Name: Daiton Cowles MRN: [id number] Date of Birth: Aug 15, 1955  Subjective/Objective:  From home, s/p stent intervention, will be on brilinta,he has Medicaid , which brilinta is a preferred medication co pay is around 3.50.  NCM shceduled a follow [facility] the [facility] for [facility]/19 at 3:50.  RN  Will give patient 30 day coupon savings card.                  Action/Plan: DC home when ready.   Expected Discharge Date:                  Expected Discharge Plan:  Home/Self Care  In-House Referral:     Discharge planning Services  CM Consult, Follow-up appt scheduled  Post Acute Care Choice:    Choice offered to:     DME Arranged:    DME Agency:     HH Arranged:    HH Agency:     Status of Service:  Completed, signed off  If discussed at [employer] of Stay Meetings, dates discussed:    Additional Comments:  Zenon Mayo, RN 11/12/2017, 2:09 PM

## 2017-11-12 NOTE — Progress Notes (Signed)
Patient admitted to unit with TR band on coban dressing proximal to site. After deflation and band removed at 1800, coban dressing removed and noted no swelling.   TR BAND REMOVAL  LOCATION:    right radial  DEFLATED PER PROTOCOL:    Yes.    TIME BAND OFF / DRESSING APPLIED:    1800   SITE UPON ARRIVAL:    Level 0  SITE AFTER BAND REMOVAL:    Level 0  CIRCULATION SENSATION AND MOVEMENT:    Within Normal Limits   Yes.    COMMENTS:   Rechecked at 1830 with no change in assessment

## 2017-11-12 NOTE — Progress Notes (Signed)
ANTICOAGULATION CONSULT NOTE  Pharmacy Consult:  Heparin Indication: chest pain/ACS  No Known Allergies  Patient Measurements: Height: 6\' 2"  (188 cm) Weight: 177 lb 8 oz (80.5 kg) IBW/kg (Calculated) : 82.2 Heparin Dosing Weight: 82 kg  Vital Signs: Temp: 98.3 F (36.8 C) (05/21 0618) Temp Source: Oral (05/21 0618) BP: 140/84 (05/21 0618) Pulse Rate: 53 (05/21 0618)  Labs: Recent Labs    11/11/17 1057 11/11/17 1158 11/11/17 2000 11/12/17 0434  HGB 13.3  --   --  12.0*  HCT 42.0  --   --  37.9*  PLT 177  --   --  150  LABPROT  --  13.5  --   --   INR  --  1.04  --   --   HEPARINUNFRC  --   --  0.40 0.25*  CREATININE 1.11  --   --   --     Estimated Creatinine Clearance: 80.6 mL/min (by C-G formula based on SCr of 1.11 mg/dL).   Assessment: [name] [name] continues on IV heparin for ACS.  Heparin level is slightly subtherapeutic.  Plan cath later today   Goal of Therapy:  Heparin level 0.3-0.7 units/ml Monitor platelets by anticoagulation protocol: Yes    Plan:  Increase heparin gtt to 1150 units/hr F/U after cath Thanks for allowing pharmacy to be a part of this patient's care.  Excell Seltzer, PharmD Clinical Pharmacist   11/12/2017, 6:42 AM

## 2017-11-12 NOTE — H&P (View-Only) (Signed)
Progress Note  Patient Name: [name] [name] Date of Encounter: 11/12/2017  Primary Cardiologist: Dr. Gwenlyn Found   Subjective   No chest pain, awaiting cath. Heparin drip  Inpatient Medications    Scheduled Meds: . aspirin EC  81 mg Oral Daily  . atorvastatin  80 mg Oral q1800  . carvedilol  6.25 mg Oral BID WC  . folic acid  1 mg Oral Daily  . lisinopril  5 mg Oral Daily  . LORazepam  0-4 mg Oral Q6H   Followed by  . [START ON 11/13/2017] LORazepam  0-4 mg Oral Q12H  . multivitamin with minerals  1 tablet Oral Daily  . nicotine  14 mg Transdermal Daily  . nitroGLYCERIN  1 inch Topical Q6H  . sodium chloride flush  3 mL Intravenous Q12H  . sodium chloride flush  3 mL Intravenous Q12H  . thiamine  100 mg Oral Daily   Or  . thiamine  100 mg Intravenous Daily   Continuous Infusions: . sodium chloride    . sodium chloride    . sodium chloride 1 mL/kg/hr (11/12/17 0844)  . heparin 1,150 Units/hr (11/12/17 0842)   PRN Meds: sodium chloride, sodium chloride, acetaminophen, ALPRAZolam, LORazepam **OR** LORazepam, nitroGLYCERIN, ondansetron (ZOFRAN) IV, sodium chloride flush, sodium chloride flush, zolpidem   Vital Signs    Vitals:   11/11/17 2135 11/11/17 2357 11/12/17 0618 11/12/17 0845  BP:  (!) 145/84 140/84 (!) 146/100  Pulse: 65 (!) 58 (!) 53 (!) 58  Resp:      Temp:   98.3 F (36.8 C)   TempSrc:   Oral   SpO2:   100%   Weight:   172 lb 3.2 oz (78.1 kg)   Height:        Intake/Output Summary (Last 24 hours) at 11/12/2017 1109 Last data filed at 11/12/2017 0700 Gross per 24 hour  Intake 490.05 ml  Output -  Net 490.05 ml   Filed Weights   11/11/17 1200 11/11/17 2003 11/12/17 0618  Weight: 180 lb (81.6 kg) 177 lb 8 oz (80.5 kg) 172 lb 3.2 oz (78.1 kg)    Telemetry    sinus - Personally Reviewed  ECG    No new tracings - Personally Reviewed  Physical Exam   GEN: No acute distress.   Neck: No JVD Cardiac: RRR, no murmurs, rubs, or gallops.    Respiratory: Clear to auscultation bilaterally. GI: Soft, nontender, non-distended  MS: No edema; No deformity. Neuro:  Nonfocal  Psych: Normal affect   Labs    Chemistry Recent Labs  Lab 11/11/17 1057 11/12/17 0434  NA 138 137  K 3.7 3.9  CL 104 105  CO2 25 24  GLUCOSE 117* 95  BUN 13 15  CREATININE 1.11 1.02  CALCIUM 9.1 8.8*  PROT  --  6.5  ALBUMIN  --  3.5  AST  --  18  ALT  --  14*  ALKPHOS  --  52  BILITOT  --  0.5  GFRNONAA >60 >60  GFRAA >60 >60  ANIONGAP 9 8     Hematology Recent Labs  Lab 11/11/17 1057 11/12/17 0434  WBC 5.5 4.8  RBC 4.86 4.31  HGB 13.3 12.0*  HCT 42.0 37.9*  MCV 86.4 87.9  MCH 27.4 27.8  MCHC 31.7 31.7  RDW 14.5 14.6  PLT 177 150    Cardiac EnzymesNo results for input(s): TROPONINI in the last 168 hours.  Recent Labs  Lab 11/11/17 1105 11/11/17 1510  TROPIPOC 0.31*  0.41*     BNPNo results for input(s): BNP, PROBNP in the last 168 hours.   DDimer No results for input(s): DDIMER in the last 168 hours.   Radiology    Dg Chest 2 View  Result Date: 11/11/2017 CLINICAL DATA:  Chest pain, shortness of breath EXAM: CHEST - 2 VIEW COMPARISON:  08/17/2015 FINDINGS: Lungs are clear.  No pleural effusion or pneumothorax. The heart is normal in size. Visualized osseous structures are within normal limits. IMPRESSION: Normal chest radiographs. Electronically Signed   By: Julian Hy M.D.   On: 11/11/2017 12:26    Cardiac Studies   Echo pending  Heart cath scheduled for today  Patient Profile     62 y.o. male with a hx of HTN, HLD, CAD w/ LAD BMS stent 2011 in [place], who presented to [facility] with chest pain found to have a NSTEMI. Plan for echo and cath today.  Assessment & Plan    1. NSTEMI - plan for heat cath today - pt without chest pain - echo pending final read - continue ASA, lipitor 80 mg  2. HTN - no home medications - on coreg, lisinopril  - review medications following echo results  3. Alcohol  abuse - CIWA protocol  4. Hx f medication noncompliance - needs close follow up  5. Current smoker - encourage cessation    For questions or updates, please contact [place] Please consult [email or web address] for contact info under Cardiology/STEMI.      Signed, Tami Lin Duke, PA  11/12/2017, 11:09 AM     Agree with note by Fabian Sharp PA-C  [name] [name] was admitted yesterday with unstable angina and the minimally positive enzymes.  He remains on IV heparin.  He had no recurrent chest pain.  He is scheduled for left heart cath this afternoon as well as 2D echocardiogram.  His exam is benign.  Lorretta Harp, M.D., [place], [facility], [place] [address] [place], [place]  [address]  [phone] 11/12/2017 11:15 AM

## 2017-11-12 NOTE — Progress Notes (Addendum)
Progress Note  Patient Name: [name] [name] Date of Encounter: 11/12/2017  Primary Cardiologist: Dr. Gwenlyn [name]   Subjective   No chest pain, awaiting cath. Heparin drip  Inpatient Medications    Scheduled Meds: . aspirin EC  81 mg Oral Daily  . atorvastatin  80 mg Oral q1800  . carvedilol  6.25 mg Oral BID WC  . folic acid  1 mg Oral Daily  . lisinopril  5 mg Oral Daily  . LORazepam  0-4 mg Oral Q6H   Followed by  . [START ON 11/13/2017] LORazepam  0-4 mg Oral Q12H  . multivitamin with minerals  1 tablet Oral Daily  . nicotine  14 mg Transdermal Daily  . nitroGLYCERIN  1 inch Topical Q6H  . sodium chloride flush  3 mL Intravenous Q12H  . sodium chloride flush  3 mL Intravenous Q12H  . thiamine  100 mg Oral Daily   Or  . thiamine  100 mg Intravenous Daily   Continuous Infusions: . sodium chloride    . sodium chloride    . sodium chloride 1 mL/kg/hr (11/12/17 0844)  . heparin 1,150 Units/hr (11/12/17 0842)   PRN Meds: sodium chloride, sodium chloride, acetaminophen, ALPRAZolam, LORazepam **OR** LORazepam, nitroGLYCERIN, ondansetron (ZOFRAN) IV, sodium chloride flush, sodium chloride flush, zolpidem   Vital Signs    Vitals:   11/11/17 2135 11/11/17 2357 11/12/17 0618 11/12/17 0845  BP:  (!) 145/84 140/84 (!) 146/100  Pulse: 65 (!) 58 (!) 53 (!) 58  Resp:      Temp:   98.3 F (36.8 C)   TempSrc:   Oral   SpO2:   100%   Weight:   172 lb 3.2 oz (78.1 kg)   Height:        Intake/Output Summary (Last 24 hours) at 11/12/2017 1109 Last data filed at 11/12/2017 0700 Gross per 24 hour  Intake 490.05 ml  Output -  Net 490.05 ml   Filed Weights   11/11/17 1200 11/11/17 2003 11/12/17 0618  Weight: 180 lb (81.6 kg) 177 lb 8 oz (80.5 kg) 172 lb 3.2 oz (78.1 kg)    Telemetry    sinus - Personally Reviewed  ECG    No new tracings - Personally Reviewed  Physical Exam   GEN: No acute distress.   Neck: No JVD Cardiac: RRR, no murmurs, rubs, or gallops.    Respiratory: Clear to auscultation bilaterally. GI: Soft, nontender, non-distended  MS: No edema; No deformity. Neuro:  Nonfocal  Psych: Normal affect   Labs    Chemistry Recent Labs  Lab 11/11/17 1057 11/12/17 0434  NA 138 137  K 3.7 3.9  CL 104 105  CO2 25 24  GLUCOSE 117* 95  BUN 13 15  CREATININE 1.11 1.02  CALCIUM 9.1 8.8*  PROT  --  6.5  ALBUMIN  --  3.5  AST  --  18  ALT  --  14*  ALKPHOS  --  52  BILITOT  --  0.5  GFRNONAA >60 >60  GFRAA >60 >60  ANIONGAP 9 8     Hematology Recent Labs  Lab 11/11/17 1057 11/12/17 0434  WBC 5.5 4.8  RBC 4.86 4.31  HGB 13.3 12.0*  HCT 42.0 37.9*  MCV 86.4 87.9  MCH 27.4 27.8  MCHC 31.7 31.7  RDW 14.5 14.6  PLT 177 150    Cardiac EnzymesNo results for input(s): TROPONINI in the last 168 hours.  Recent Labs  Lab 11/11/17 1105 11/11/17 1510  TROPIPOC 0.31*  0.41*     BNPNo results for input(s): BNP, PROBNP in the last 168 hours.   DDimer No results for input(s): DDIMER in the last 168 hours.   Radiology    Dg Chest 2 View  Result Date: 11/11/2017 CLINICAL DATA:  Chest pain, shortness of breath EXAM: CHEST - 2 VIEW COMPARISON:  08/17/2015 FINDINGS: Lungs are clear.  No pleural effusion or pneumothorax. The heart is normal in size. Visualized osseous structures are within normal limits. IMPRESSION: Normal chest radiographs. Electronically Signed   By: Julian Hy M.D.   On: 11/11/2017 12:26    Cardiac Studies   Echo pending  Heart cath scheduled for today  Patient Profile     62 y.o. male with a hx of HTN, HLD, CAD w/ LAD BMS stent 2011 in [place], who presented to [facility] with chest pain [name] to have a NSTEMI. Plan for echo and cath today.  Assessment & Plan    1. NSTEMI - plan for heat cath today - pt without chest pain - echo pending final read - continue ASA, lipitor 80 mg  2. HTN - no home medications - on coreg, lisinopril  - review medications following echo results  3. Alcohol  abuse - CIWA protocol  4. Hx f medication noncompliance - needs close follow up  5. Current smoker - encourage cessation    For questions or updates, please contact [place] Please consult [email or web address] for contact info under Cardiology/STEMI.      Signed, [name] Lin Duke, PA  11/12/2017, 11:09 AM     Agree with note by [name] Sharp PA-C  [name] [name] was admitted yesterday with unstable angina and the minimally positive enzymes.  He remains on IV heparin.  He had no recurrent chest pain.  He is scheduled for left heart cath this afternoon as well as 2D echocardiogram.  His exam is benign.  [name] [name], M.D., [place], [facility], [name] [name] [address] [place], [place]  [address]  [phone] 11/12/2017 11:15 AM

## 2017-11-12 NOTE — Interval H&P Note (Signed)
History and Physical Interval Note:  11/12/2017 12:00 PM  [name] [name]  has presented today for surgery, with the diagnosis of n stemi. Known CAD - BMS PCI to LAD.   The various methods of treatment have been discussed with the patient and family. After consideration of risks, benefits and other options for treatment, the patient has consented to  Procedure(s): LEFT HEART CATH AND CORONARY ANGIOGRAPHY (N/A) with possible PERCUTANEOUS CORONARY INTERVENTION as a surgical intervention .  The patient's history has been reviewed, patient examined, no change in status, stable for surgery.  I have reviewed the patient's chart and labs.  Questions were answered to the patient's satisfaction.    Cath Lab Visit (complete for each Cath Lab visit)  Clinical Evaluation Leading to the Procedure:   ACS: Yes.    Non-ACS:    Anginal Classification: CCS IV  Anti-ischemic medical therapy: Minimal Therapy (1 class of medications)  Non-Invasive Test Results: No non-invasive testing performed  Prior CABG: No previous CABG   [name]

## 2017-11-12 NOTE — Brief Op Note (Signed)
   BRIEF CATH - PCI NOTE  11/12/2017  1:12 PM  PATIENT:  [name] [name]  62 y.o. male with history of significant hypertension and prior PCI to the LAD in 2011 with a BMS).  He has medical noncompliance.  He presented to [facility] with accelerated hypertension and non-ST elevation MI.  Now presents for cardiac catheterization plus/minus PCI.  PRE-OPERATIVE DIAGNOSIS:  n stemi  POST-OPERATIVE DIAGNOSIS:    Culprit vessel proximal and mid lesions of the circumflex-OM branch: Proximal 70 followed by 80 prior to AV groove circumflex then 95, 80 and 70 after the AV groove circumflex.  This entire segment was covered with 2 overlapping Synergy DES stents 2.5 mm x 24 mm both. postdilated to taper 3.1 down to 2.75 mm  AV groove circumflex has focal 90% stenosis, and the ramus intermedius also has 80% stenosis.  Both vessels are too small for PCI.  There is 20% in-stent restenosis in the LAD  PROCEDURE:  Procedure(s): LEFT HEART CATH AND CORONARY ANGIOGRAPHY (N/A) CORONARY STENT INTERVENTION (N/A)    Right radial access: 6 French sheath, Seldinger technique -> take 4.0 and JR4 catheters for left and right coronary angiography and LV hemodynamics.  PCI:  6 French XB 3.5 3 guide catheter --pro-water wire, predilation with 2.0 x 15 mm balloon. -2 overlapping stents  Distal section lesions: Synergy DES 2.5 mm x 24 mm postdilated 2.75 mm.  Proximal section lesions: Synergy DES 2.5 mm 25 mill meter postdilated 3.1 mm  SURGEON:  Surgeon(s) and Role:    * [name] Man, MD - Primary  PHYSICIAN ASSISTANT:   ASSISTANTS: none   ANESTHESIA:   local and IV sedation; 4 mL subcu lidocaine, 2 mg Versed, 5075 mg fentanyl (sedation time 53 minutes)  EBL:  <50 mL  MEDICATIONS USED: Radial cocktail: 3 mg verapamil, 130 mL contrast; Brilinta 100 mg p.o., total heparin 11,000 units, IC nitroglycerin 200 mcg x 3, started IV  Nitroglycerin drip  for chest pain and hypertension.    TR  Band: 14 mL air, 1305 hrs.  DICTATION: .Note written in EPIC  PLAN OF CARE: Transfer to 6 Central post procedure unit.  Anticipate discharge tomorrow.  Needs aggressive risk factor modification with blood pressure, lipid and glycemic control.   Stressed importance of medical adherence:  DAPT x1 year  PATIENT DISPOSITION:  PACU - hemodynamically stable.     [name] [name], M.D., M.S. Interventional Cardiologist   Pager # [phone] Phone # [phone] [address] [place], [place] [address]

## 2017-11-13 ENCOUNTER — Encounter (HOSPITAL_COMMUNITY): Payer: Self-pay | Admitting: Cardiology

## 2017-11-13 DIAGNOSIS — F17200 Nicotine dependence, unspecified, uncomplicated: Secondary | ICD-10-CM

## 2017-11-13 DIAGNOSIS — E785 Hyperlipidemia, unspecified: Secondary | ICD-10-CM

## 2017-11-13 DIAGNOSIS — I5032 Chronic diastolic (congestive) heart failure: Secondary | ICD-10-CM

## 2017-11-13 LAB — CBC
HEMATOCRIT: 36.6 % — AB (ref 39.0–52.0)
HEMOGLOBIN: 11.7 g/dL — AB (ref 13.0–17.0)
MCH: 27.7 pg (ref 26.0–34.0)
MCHC: 32 g/dL (ref 30.0–36.0)
MCV: 86.5 fL (ref 78.0–100.0)
Platelets: 147 10*3/uL — ABNORMAL LOW (ref 150–400)
RBC: 4.23 MIL/uL (ref 4.22–5.81)
RDW: 14.6 % (ref 11.5–15.5)
WBC: 6.6 10*3/uL (ref 4.0–10.5)

## 2017-11-13 LAB — BASIC METABOLIC PANEL
Anion gap: 7 (ref 5–15)
BUN: 13 mg/dL (ref 6–20)
CHLORIDE: 105 mmol/L (ref 101–111)
CO2: 25 mmol/L (ref 22–32)
CREATININE: 1.11 mg/dL (ref 0.61–1.24)
Calcium: 9 mg/dL (ref 8.9–10.3)
GFR calc non Af Amer: >60 mL/min (ref >60)
Glucose, Bld: 102 mg/dL — ABNORMAL HIGH (ref 65–99)
Potassium: 4 mmol/L (ref 3.5–5.1)
SODIUM: 137 mmol/L (ref 135–145)

## 2017-11-13 MED ORDER — LISINOPRIL 10 MG PO TABS: 10.0000 mg | ORAL_TABLET | Freq: Every day | ORAL | 3 refills | Status: DC

## 2017-11-13 MED ORDER — NITROGLYCERIN 0.4 MG SL SUBL: 0.4000 mg | SUBLINGUAL_TABLET | SUBLINGUAL | 1 refills | Status: DC | PRN

## 2017-11-13 MED ORDER — ASPIRIN 81 MG PO TBEC: 81.0000 mg | DELAYED_RELEASE_TABLET | Freq: Every day | ORAL | 3 refills | Status: DC

## 2017-11-13 MED ORDER — ATORVASTATIN CALCIUM 80 MG PO TABS: 80.0000 mg | ORAL_TABLET | Freq: Every day | ORAL | 3 refills | Status: DC

## 2017-11-13 MED ORDER — CARVEDILOL 6.25 MG PO TABS: 6.2500 mg | ORAL_TABLET | Freq: Two times a day (BID) | ORAL | 3 refills | Status: DC

## 2017-11-13 MED ORDER — TICAGRELOR 90 MG PO TABS: 90.0000 mg | ORAL_TABLET | Freq: Two times a day (BID) | ORAL | 0 refills | Status: DC

## 2017-11-13 MED ORDER — LISINOPRIL 10 MG PO TABS
10.0000 mg | ORAL_TABLET | Freq: Every day | ORAL | Status: DC
  Administered 2017-11-13: 09:00:00 10 mg via ORAL

## 2017-11-13 MED ORDER — TICAGRELOR 90 MG PO TABS: 90.0000 mg | ORAL_TABLET | Freq: Two times a day (BID) | ORAL | 3 refills | Status: DC

## 2017-11-13 MED FILL — Heparin Sod (Porcine)-NaCl IV Soln 1000 Unit/500ML-0.9%: INTRAVENOUS | Qty: 1000 | Status: AC

## 2017-11-13 NOTE — Progress Notes (Addendum)
Progress Note  Patient Name: [name] [name] Date of Encounter: 11/13/2017  Primary Cardiologist: Quay Burow, MD   Subjective   I had a frank discussion about the importance of medication compliance, avoiding salt, and keeping a blood pressure log.   Inpatient Medications    Scheduled Meds: . active partnership for health of your heart book   Does not apply Once  . aspirin EC  81 mg Oral Daily  . atorvastatin  80 mg Oral q1800  . carvedilol  6.25 mg Oral BID WC  . folic acid  1 mg Oral Daily  . lisinopril  5 mg Oral Daily  . LORazepam  0-4 mg Oral Q6H   Followed by  . LORazepam  0-4 mg Oral Q12H  . multivitamin with minerals  1 tablet Oral Daily  . nicotine  14 mg Transdermal Daily  . sodium chloride flush  3 mL Intravenous Q12H  . sodium chloride flush  3 mL Intravenous Q12H  . thiamine  100 mg Oral Daily   Or  . thiamine  100 mg Intravenous Daily  . ticagrelor  90 mg Oral BID   Continuous Infusions: . sodium chloride    . sodium chloride 10 mL/hr at 11/13/17 0600  . nitroGLYCERIN 15 mcg/min (11/13/17 0600)   PRN Meds: sodium chloride, sodium chloride, acetaminophen, ALPRAZolam, LORazepam **OR** LORazepam, nitroGLYCERIN, ondansetron (ZOFRAN) IV, sodium chloride flush, sodium chloride flush, zolpidem   Vital Signs    Vitals:   11/13/17 0400 11/13/17 0500 11/13/17 0600 11/13/17 0757  BP: (!) 178/82 (!) 167/86 (!) 165/103 (!) 173/101  Pulse: 65 (!) 56 60 61  Resp: 14 16 13 15   Temp:   98.5 F (36.9 C)   TempSrc:   Oral Oral  SpO2: 100% 100% 100% 100%  Weight:   169 lb 12.1 oz (77 kg)   Height:        Intake/Output Summary (Last 24 hours) at 11/13/2017 0842 Last data filed at 11/13/2017 0759 Gross per 24 hour  Intake 684.71 ml  Output 2075 ml  Net -1390.29 ml   Filed Weights   11/11/17 2003 11/12/17 0618 11/13/17 0600  Weight: 177 lb 8 oz (80.5 kg) 172 lb 3.2 oz (78.1 kg) 169 lb 12.1 oz (77 kg)    Telemetry    sinus - Personally Reviewed  ECG      No new tracings - Personally Reviewed  Physical Exam   GEN: No acute distress.   Neck: No JVD Cardiac: RRR, no murmurs, rubs, or gallops. Right radial site C/D/I Respiratory: Clear to auscultation bilaterally. GI: Soft, nontender, non-distended  MS: No edema; No deformity. Neuro:  Nonfocal  Psych: Normal affect   Labs    Chemistry Recent Labs  Lab 11/11/17 1057 11/12/17 0434 11/13/17 0205  NA 138 137 137  K 3.7 3.9 4.0  CL 104 105 105  CO2 25 24 25   GLUCOSE 117* 95 102*  BUN 13 15 13   CREATININE 1.11 1.02 1.11  CALCIUM 9.1 8.8* 9.0  PROT  --  6.5  --   ALBUMIN  --  3.5  --   AST  --  18  --   ALT  --  14*  --   ALKPHOS  --  52  --   BILITOT  --  0.5  --   GFRNONAA >60 >60 >60  GFRAA >60 >60 >60  ANIONGAP 9 8 7      Hematology Recent Labs  Lab 11/11/17 1057 11/12/17 0434 11/13/17 0205  WBC 5.5 4.8 6.6  RBC 4.86 4.31 4.23  HGB 13.3 12.0* 11.7*  HCT 42.0 37.9* 36.6*  MCV 86.4 87.9 86.5  MCH 27.4 27.8 27.7  MCHC 31.7 31.7 32.0  RDW 14.5 14.6 14.6  PLT 177 150 147*    Cardiac EnzymesNo results for input(s): TROPONINI in the last 168 hours.  Recent Labs  Lab 11/11/17 1105 11/11/17 1510  TROPIPOC 0.31* 0.41*     BNPNo results for input(s): BNP, PROBNP in the last 168 hours.   DDimer No results for input(s): DDIMER in the last 168 hours.   Radiology    Dg Chest 2 View  Result Date: 11/11/2017 CLINICAL DATA:  Chest pain, shortness of breath EXAM: CHEST - 2 VIEW COMPARISON:  08/17/2015 FINDINGS: Lungs are clear.  No pleural effusion or pneumothorax. The heart is normal in size. Visualized osseous structures are within normal limits. IMPRESSION: Normal chest radiographs. Electronically Signed   By: Julian Hy M.D.   On: 11/11/2017 12:26    Cardiac Studies   Left heart cath 11/12/17:  The left ventricular systolic function is normal.  LV end diastolic pressure is normal.  The left ventricular ejection fraction is 55-65% by visual  estimate.  Ost Ramus to Ramus lesion is 80% stenosed. LAV Groove lesion is 95% stenosed. -- Plan Medical Management.  CULPRIT LESIONS: Treated with 2 overlapping DES stents  Mid Cx-1 lesion is 95% stenosed. Mid Cx-2 lesion is 80% stenosed.  A drug-eluting stent was successfully placed using a STENT SYNERGY DES 2.5X24. - post-dilated to 3.1-2.7 mm  Post intervention, there is a 0% residual stenosis.  Prox Cx-1 lesion is 70% stenosed. Prox Cx-2 lesion is 80% stenosed. (overlapping proximally)  A bare metal stent was successfully placed using a STENT SYNERGY DES 2.5X24. - post-dilated to 3.1 mm  Post intervention, there is a 0% residual stenosis.   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 Small caliber Ramus & AV Groove Cx vessels with 80% & 95% lesions to be treated medically. Mild ISR of pLAD stent. Normal EF.   PLAN OF CARE:Transfer to 6 Central post procedure unit. Anticipate discharge tomorrow. Needs aggressive risk factor modification with blood pressure, lipid and glycemic control.  Stressed importance of medical adherence:  DAPTx1 year   Echo 11/12/17: Study Conclusions - Left ventricle: The cavity size was normal. Wall thickness was   increased in a pattern of moderate LVH. Systolic function was   vigorous. The estimated ejection fraction was in the range of 65%   to 70%. Wall motion was normal; there were no regional wall   motion abnormalities. Features are consistent with a pseudonormal   left ventricular filling pattern, with concomitant abnormal   relaxation and increased filling pressure (grade 2 diastolic dysfunction).  Patient Profile     62 y.o. male with a hx of HTN, HLD, CAD w/ LADBMSstent 2011 in [place] presented to [facility] with chest pain found to have a NSTEMI. Underwent heart cath and echo, as above.  Assessment & Plan    1. NSTEMI - heart cath 11/12/17 with severe 3 vessel disease - successful  placement of 2 overlapping DES stents to the Cx-OM; 80-95% lesions in the ramus and AV groove Cx vessels treated medically; mild ISR of pLAD stent noted - started on DAPT for 12 months   2. Chronic diastolic heart failure - echo with normal EF but grade 2 diastolic dysfunction    3. HTN - previous medication noncompliance -  coreg and lisinopril started - pressures remain elevated - increased lisinopril to 10 mg - HR is 50-60s, unable to titrate beta blocker   4. Alcohol abuse - CIWA protocol   5. Current smoker - encouraged cessation    For questions or updates, please contact [place] Please consult [email or web address] for contact info under Cardiology/STEMI.      Signed, Tami Lin Duke, PA  11/13/2017, 8:42 AM     Agree with note by Sherran Needs for discharge this morning.  Postop day 1 circumflex stenting by Dr. Ellyn Hack with a long drug-eluting stent.  The LAD stent was widely patent.  He LV function was normal by 2D echo.  Patient is on optimal medications including DAPT.  We talked about the importance of smoking cessation.  We will arrange outpatient follow-up.  Lorretta Harp, M.D., [place], [facility], [place]ver[place] [address] [place],   [address]  [phone] 11/13/2017 10:19 AM

## 2017-11-13 NOTE — Progress Notes (Signed)
CARDIAC REHAB PHASE I   PRE:  Rate/Rhythm: 75 SR  BP:  Supine:   Sitting: 164/98  Standing:    SaO2: 100%RA  MODE:  Ambulation: 700 ft   POST:  Rate/Rhythm: 81 SR  BP:  Supine:   Sitting: 192/100,   After rest 163/88  Standing:    SaO2: 100%RA 0240-9735 Pt walked 700 ft with steady gait and no CP. Stated felt good to walk. Tolerated well except for elevated BP. MI education completed with pt who voiced understanding. Stressed importance of brilinta with pt several times. Reviewed NTG use, ex ed, risk factors, smoking cessation, ETOH use, heart healthy food choices and CRP 2. Referred to [place] program. Gave fake cigarette and smoking cessation handout. Pt has never quit before. Seems willing to try. Discussed affect of ETOH on heart also. Cardiology PA aware of BP.   Graylon Good, RN BSN  11/13/2017 9:21 AM

## 2017-11-13 NOTE — Discharge Summary (Addendum)
Discharge Summary    Patient ID: [name] [name],  MRN: [id number], DOB/AGE: 1956/03/23 62 y.o.  Admit date: 11/11/2017 Discharge date: 11/13/2017  Primary Care Provider: Marliss Coots Primary Cardiologist: Quay Burow, MD  Discharge Diagnoses    Principal Problem:   NSTEMI (non-ST elevated myocardial infarction) [facility]) Active Problems:   HTN (hypertension)   Current smoker   Alcohol abuse   H/O medication noncompliance   Hyperlipidemia LDL goal <70   Chronic diastolic heart failure ([place])   Allergies No Known Allergies  Diagnostic Studies/Procedures    Left heart cath 11/12/17:  The left ventricular systolic function is normal.  LV end diastolic pressure is normal.  The left ventricular ejection fraction is 55-65% by visual estimate.  Ost Ramus to Ramus lesion is 80% stenosed. LAV Groove lesion is 95% stenosed. -- Plan Medical Management.  CULPRIT LESIONS: Treated with 2 overlapping DES stents  Mid Cx-1 lesion is 95% stenosed. Mid Cx-2 lesion is 80% stenosed.  A drug-eluting stent was successfully placed using a STENT SYNERGY DES 2.5X24. - post-dilated to 3.1-2.7 mm  Post intervention, there is a 0% residual stenosis.  Prox Cx-1 lesion is 70% stenosed. Prox Cx-2 lesion is 80% stenosed. (overlapping proximally)  A bare metal stent was successfully placed using a STENT SYNERGY DES 2.5X24. - post-dilated to 3.1 mm  Post intervention, there is a 0% residual stenosis.  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 Small caliber Ramus & AV Groove Cx vessels with 80% & 95% lesions to be treated medically. Mild ISR of pLAD stent. Normal EF.   PLAN OF CARE:Transfer to 6 Central post procedure unit. Anticipate discharge tomorrow. Needs aggressive risk factor modification with blood pressure, lipid and glycemic control.  Stressed importance of medical adherence:  DAPTx1 year   Echo 11/12/17: Study Conclusions -  Left ventricle: The cavity size was normal. Wall thickness was increased in a pattern of moderate LVH. Systolic function was vigorous. The estimated ejection fraction was in the range of 65%  to 70%. Wall motion was normal; there were no regional wall motion abnormalities. Features are consistent with a pseudonormal left ventricular filling pattern, with concomitant abnormal relaxation and increased filling pressure (grade 2 diastolic dysfunction).   History of Present Illness     [name] [name] is a 62 y.o. male with a hx of HTN, HLD, CAD w/ LAD BMS stent 2011 in [place], who presented to [facility] with NSTEMI.  Mr. Schlink was not taking any medications prior to this admission. He has never taken rx consistently. After his stent in [place], he does not remember taking Plavix, admits he probably did not.   He developed chest pain about a week ago. He has had it intermittently since then. Consistently with exertion, and sometimes at rest. No rx tried. Pressure, 3-4/10, worse with increased exertion. No radiation. Relieved by rest in about 5-10 minutes. No N&V or diaphoresis but does get a little SOB. The SOB as well as the CP reminds him of pre-PCI symptoms in 2011.  Activity level at baseline is low. He does not exercise. He drinks a fifth about every other day, has had sx from ETOH withdrawal before. Continues to smoke.  Hospital Course     Consultants: none  NSTEMI He was admitted to cardiology and taken to the cath lab on 11/12/17. Heart cath with severe 3 vessel disease. Successful placement of 2 overlapping DES stents to the Cx-OM; 80-90% lesions in the ramus and  AV groove of Cx vessels that will be treated medically as well as mild ISR of pLAD bare metal stent. He tolerated the procedure well. He has ambulated in the halls on room air. Right radial site C/D/I. He is ready for discharge.   Chronic diastolic heart failure Echo with normal EF but grad 2 DD. Encouraged salt restriction.     HLD with goal LDL below 70 11/12/2017: Cholesterol 175; HDL 42; LDL Cholesterol 114; Triglycerides 93; VLDL 19 Started 80 mg lipitor this admission. Will need repeat LFTs and lipids in 6 weeks.  HTN Previously noncompliant on medications. Coreg and lisinopril started this admission. Pressures were still elevated.  Lisinopril titrated to 10 mg daily. Unable to titrate coreg for heart rate in the 50-60s. Talked to patient about keeping a BP log and bringing this to his follow up appt.  Current smoker Encouraged cessation.  _____________  Discharge Vitals Blood pressure (!) 173/101, pulse 61, temperature 98.5 F (36.9 C), temperature source Oral, resp. rate 15, height 6\' 2"  (1.88 m), weight 169 lb 12.1 oz (77 kg), SpO2 100 %.  Filed Weights   11/11/17 2003 11/12/17 0618 11/13/17 0600  Weight: 177 lb 8 oz (80.5 kg) 172 lb 3.2 oz (78.1 kg) 169 lb 12.1 oz (77 kg)    Labs & Radiologic Studies    CBC Recent Labs    11/12/17 0434 11/13/17 0205  WBC 4.8 6.6  HGB 12.0* 11.7*  HCT 37.9* 36.6*  MCV 87.9 86.5  PLT 150 324*   Basic Metabolic Panel Recent Labs    11/12/17 0434 11/13/17 0205  NA 137 137  K 3.9 4.0  CL 105 105  CO2 24 25  GLUCOSE 95 102*  BUN 15 13  CREATININE 1.02 1.11  CALCIUM 8.8* 9.0   Liver Function Tests Recent Labs    11/12/17 0434  AST 18  ALT 14*  ALKPHOS 52  BILITOT 0.5  PROT 6.5  ALBUMIN 3.5   No results for input(s): LIPASE, AMYLASE in the last 72 hours. Cardiac Enzymes No results for input(s): CKTOTAL, CKMB, CKMBINDEX, TROPONINI in the last 72 hours. BNP Invalid input(s): POCBNP D-Dimer No results for input(s): DDIMER in the last 72 hours. Hemoglobin A1C No results for input(s): HGBA1C in the last 72 hours. Fasting Lipid Panel Recent Labs    11/12/17 0434  CHOL 175  HDL 42  LDLCALC 114*  TRIG 93  CHOLHDL 4.2   Thyroid Function Tests Recent Labs    11/12/17 0434  TSH 1.283   _____________  Dg Chest 2 View  Result  Date: 11/11/2017 CLINICAL DATA:  Chest pain, shortness of breath EXAM: CHEST - 2 VIEW COMPARISON:  08/17/2015 FINDINGS: Lungs are clear.  No pleural effusion or pneumothorax. The heart is normal in size. Visualized osseous structures are within normal limits. IMPRESSION: Normal chest radiographs. Electronically Signed   By: Julian Hy M.D.   On: 11/11/2017 12:26   Disposition   Pt is being discharged home today in good condition.  Follow-up Plans & Appointments    Follow-up Information    [facility] Follow up on 12/11/2017.   Why:  3:50 for hospital follow up Contact information: [place] [address] [phone]       Erlene Quan, PA-C Follow up on 11/20/2017.   Specialties:  Cardiology, Radiology Why:  11:00 TCM  follow up appt Contact information: [place] [address] [phone]  Discharge Instructions    Amb Referral to Cardiac Rehabilitation   Complete by:  As directed    Diagnosis:   Coronary Stents NSTEMI     Diet - low sodium heart healthy   Complete by:  As directed    Discharge instructions   Complete by:  As directed    No driving for 1 week. No lifting over 5 lbs for 1 week. No sexual activity for 1 week. You may return to work in 1 week. Keep procedure site clean & dry. If you notice increased pain, swelling, bleeding or pus, call/return!  You may shower, but no soaking baths/hot tubs/pools for 1 week.   Increase activity slowly   Complete by:  As directed       Discharge Medications   Allergies as of 11/13/2017   No Known Allergies     Medication List    STOP taking these medications   clindamycin 150 MG capsule Commonly known as:  CLEOCIN   lovastatin 20 MG tablet Commonly known as:  MEVACOR     TAKE these medications   aspirin 81 MG EC tablet Take 1 tablet (81 mg total) by mouth daily.   atorvastatin 80 MG  tablet Commonly known as:  LIPITOR Take 1 tablet (80 mg total) by mouth daily at 6 PM.   carvedilol 6.25 MG tablet Commonly known as:  COREG Take 1 tablet (6.25 mg total) by mouth 2 (two) times daily with a meal.   diclofenac 50 MG EC tablet Commonly known as:  VOLTAREN Take 1 tablet (50 mg total) by mouth 2 (two) times daily.   HYDROcodone-acetaminophen 5-325 MG tablet Commonly known as:  NORCO Take 1 tablet by mouth every 6 (six) hours as needed.   lisinopril 10 MG tablet Commonly known as:  PRINIVIL,ZESTRIL Take 1 tablet (10 mg total) by mouth daily.   multivitamin with minerals Tabs tablet Take 1 tablet by mouth daily.   nitroGLYCERIN 0.4 MG SL tablet Commonly known as:  NITROSTAT Place 1 tablet (0.4 mg total) under the tongue every 5 (five) minutes as needed for chest pain.   ticagrelor 90 MG Tabs tablet Commonly known as:  BRILINTA Take 1 tablet (90 mg total) by mouth 2 (two) times daily.        Aspirin prescribed at discharge?  Yes High Intensity Statin Prescribed? (Lipitor 40-80mg  or Crestor 20-40mg ): Yes Beta Blocker Prescribed? Yes For EF <40%, was ACEI/ARB Prescribed? Yes ADP Receptor Inhibitor Prescribed? (i.e. Plavix etc.-Includes Medically Managed Patients): Yes For EF <40%, Aldosterone Inhibitor Prescribed? No: normal ef Was EF assessed during THIS hospitalization? Yes Was Cardiac Rehab II ordered? (Included Medically managed Patients): Yes   Outstanding Labs/Studies   Lipids and LFTs in 6 weeks  Duration of Discharge Encounter   Greater than 30 minutes including physician time.  Signed, [place], [place] 11/13/2017, 11:01 AM  Agree with note by Fabian Sharp PA-C  Stable for discharge this morning status post circumflex stenting by Dr. Ellyn Hack yesterday with a patent LAD stent.  His enzymes were low and flat.  He said no recurrent symptoms.  He is on optimal medical therapy including DAPT.  We will arrange outpatient  follow-up.  Lorretta Harp, M.D., [place], [facility], Laverta [place] [address]. [place], [place]  [address]  [phone] 11/13/2017 12:50 PM

## 2017-11-20 ENCOUNTER — Ambulatory Visit: Payer: Medicaid Other | Admitting: Cardiology

## 2017-12-11 ENCOUNTER — Encounter: Payer: Self-pay | Admitting: Cardiology

## 2017-12-11 ENCOUNTER — Ambulatory Visit (INDEPENDENT_AMBULATORY_CARE_PROVIDER_SITE_OTHER): Payer: Medicaid Other | Admitting: Physician Assistant

## 2017-12-13 ENCOUNTER — Ambulatory Visit: Payer: Medicaid Other | Admitting: Cardiology

## 2017-12-20 ENCOUNTER — Telehealth (HOSPITAL_COMMUNITY): Payer: Self-pay

## 2017-12-20 NOTE — Telephone Encounter (Signed)
Called patient to see if he is interested in the Cardiac Rehab Program - Patient stated he may be interested. I informed patient that he will need to call Cardiologist to schedule appt and complete appt before being scheduled for Cardiac Rehab. Patient verbalized understanding.

## 2018-01-16 ENCOUNTER — Telehealth (HOSPITAL_COMMUNITY): Payer: Self-pay

## 2018-01-16 NOTE — Telephone Encounter (Signed)
Called to follow up with patient in regards to f/u appt - patient stated he is unsure if he wants to participate in Cardiac Rehab. I explained to patient if he decides to do program he will need to call and reschedule missed f/u appt. Patient stated he will call to reschedule. Will close referral.

## 2019-07-13 ENCOUNTER — Encounter: Payer: Self-pay | Admitting: Internal Medicine

## 2019-08-06 ENCOUNTER — Encounter: Payer: Self-pay | Admitting: Family Medicine

## 2019-08-19 ENCOUNTER — Encounter: Payer: Medicaid Other | Admitting: Internal Medicine

## 2019-10-13 ENCOUNTER — Other Ambulatory Visit: Payer: Self-pay

## 2019-10-13 ENCOUNTER — Emergency Department (HOSPITAL_COMMUNITY)
Admission: EM | Admit: 2019-10-13 | Discharge: 2019-10-13 | Disposition: A | Discharge status: Home / Self Care | Payer: Medicaid Other | Attending: Emergency Medicine | Admitting: Emergency Medicine

## 2019-10-13 ENCOUNTER — Encounter (HOSPITAL_COMMUNITY): Payer: Self-pay | Admitting: Emergency Medicine

## 2019-10-13 ENCOUNTER — Emergency Department (HOSPITAL_COMMUNITY): Payer: Medicaid Other

## 2019-10-13 DIAGNOSIS — Z79899 Other long term (current) drug therapy: Secondary | ICD-10-CM | POA: Insufficient documentation

## 2019-10-13 DIAGNOSIS — I5032 Chronic diastolic (congestive) heart failure: Secondary | ICD-10-CM | POA: Insufficient documentation

## 2019-10-13 DIAGNOSIS — I2510 Atherosclerotic heart disease of native coronary artery without angina pectoris: Secondary | ICD-10-CM | POA: Diagnosis not present

## 2019-10-13 DIAGNOSIS — I110 Hypertensive heart disease with heart failure: Secondary | ICD-10-CM | POA: Diagnosis not present

## 2019-10-13 DIAGNOSIS — R918 Other nonspecific abnormal finding of lung field: Secondary | ICD-10-CM

## 2019-10-13 DIAGNOSIS — I252 Old myocardial infarction: Secondary | ICD-10-CM | POA: Diagnosis not present

## 2019-10-13 DIAGNOSIS — Z7982 Long term (current) use of aspirin: Secondary | ICD-10-CM | POA: Diagnosis not present

## 2019-10-13 DIAGNOSIS — F17210 Nicotine dependence, cigarettes, uncomplicated: Secondary | ICD-10-CM | POA: Diagnosis not present

## 2019-10-13 DIAGNOSIS — R0602 Shortness of breath: Secondary | ICD-10-CM | POA: Diagnosis present

## 2019-10-13 LAB — URINALYSIS, ROUTINE W REFLEX MICROSCOPIC
Bacteria, UA: NONE SEEN
Bilirubin Urine: NEGATIVE
Glucose, UA: NEGATIVE mg/dL
Hgb urine dipstick: NEGATIVE
Ketones, ur: NEGATIVE mg/dL
Leukocytes,Ua: NEGATIVE
Nitrite: NEGATIVE
Protein, ur: 30 mg/dL — AB
Specific Gravity, Urine: 1.018 (ref 1.005–1.030)
pH: 5 (ref 5.0–8.0)

## 2019-10-13 LAB — BASIC METABOLIC PANEL
Anion gap: 12 (ref 5–15)
BUN: 16 mg/dL (ref 8–23)
CO2: 22 mmol/L (ref 22–32)
Calcium: 9.4 mg/dL (ref 8.9–10.3)
Chloride: 104 mmol/L (ref 98–111)
Creatinine, Ser: 1.41 mg/dL — ABNORMAL HIGH (ref 0.61–1.24)
GFR calc Af Amer: >60 mL/min (ref >60)
GFR calc non Af Amer: 53 mL/min — ABNORMAL LOW (ref >60)
Glucose, Bld: 94 mg/dL (ref 70–99)
Potassium: 3.6 mmol/L (ref 3.5–5.1)
Sodium: 138 mmol/L (ref 135–145)

## 2019-10-13 LAB — CBC
HCT: 37.7 % — ABNORMAL LOW (ref 39.0–52.0)
Hemoglobin: 11.8 g/dL — ABNORMAL LOW (ref 13.0–17.0)
MCH: 27.5 pg (ref 26.0–34.0)
MCHC: 31.3 g/dL (ref 30.0–36.0)
MCV: 87.9 fL (ref 80.0–100.0)
Platelets: 211 10*3/uL (ref 150–400)
RBC: 4.29 MIL/uL (ref 4.22–5.81)
RDW: 14 % (ref 11.5–15.5)
WBC: 7.7 10*3/uL (ref 4.0–10.5)
nRBC: 0 % (ref 0.0–0.2)

## 2019-10-13 MED ORDER — IOHEXOL 300 MG/ML  SOLN
75.0000 mL | Freq: Once | INTRAMUSCULAR | Status: AC | PRN
  Administered 2019-10-13: 75 mL via INTRAVENOUS

## 2019-10-13 NOTE — Discharge Instructions (Addendum)
There were lung nodules noted on the CT scan.  These need to be further investigated as well as your continued shortness of breath. We recommend follow-up with your primary care provider as well as pulmonology.  Call to make an appointment.  Return to the emergency department for worsening shortness of breath, chest pain, dizziness, passing out, or any other major concerns.

## 2019-10-13 NOTE — ED Notes (Signed)
Pt admitted to drinking 1 and 1/2 pts of alcohol a day for the last 2 years.

## 2019-10-13 NOTE — ED Triage Notes (Signed)
Pt endorses SOB and fatigue for a few weeks. Denies any pain or sick contacts.

## 2019-10-13 NOTE — ED Provider Notes (Signed)
[facility] EMERGENCY DEPARTMENT Provider Note   CSN: [id number] Arrival date & time: 10/13/19  1213     History Chief Complaint  Patient presents with  . Shortness of Breath    [name] [name] is a 64 y.o. male.  HPI      [name] [name] is a 64 y.o. male, with a history of tobacco use, meningioma, HTN, hypercholesterolemia, NSTEMI, presenting to the ED with shortness of breath over the past several months. He also notes increasing fatigue over the same time period.  Denies fever/chills, dizziness, syncope, headaches, neurologic deficits, chest pain, abdominal pain, lower extremity edema/pain, or any other complaints.   Past Medical History:  Diagnosis Date  . Brain tumor [facility])    diagnosed by MRI 6 months ago  . Coronary artery disease 2011   s/p BMS stent LAD, [facility], [facility] [place], [place], Dr Chauncey Cruel. [name]  . High cholesterol   . HTN (hypertension)   . Hyperlipidemia LDL goal <70 07/21/2013  . NSTEMI (non-ST elevated myocardial infarction) ([place]) 11/11/2017  . Smoker    1/2 ppd x >40 years     Patient Active Problem List   Diagnosis Date Noted  . Chronic diastolic heart failure ([place]) 11/13/2017  . NSTEMI (non-ST elevated myocardial infarction) ([place]) 11/11/2017  . SOB (shortness of breath) on exertion 07/21/2013  . Dizziness 07/21/2013  . Essential hypertension 07/21/2013  . Current smoker 07/21/2013  . Alcohol abuse 07/21/2013  . CAD S/P percutaneous coronary angioplasty 07/21/2013  . H/O medication noncompliance 07/21/2013  . Hyperlipidemia LDL goal <70 07/21/2013    Past Surgical History:  Procedure Laterality Date  . CORONARY ANGIOPLASTY WITH STENT PLACEMENT Left 2011   BMS LAD, [facility], [facility] [place], [place], Dr Chauncey Cruel. [name]  . CORONARY STENT INTERVENTION N/A 11/12/2017   Procedure: CORONARY STENT INTERVENTION;  Surgeon: Leonie Man, MD;  Location: [facility] CV LAB;  Service: Cardiovascular;  Laterality: N/A;  .  LEFT HEART CATH AND CORONARY ANGIOGRAPHY N/A 11/12/2017   Procedure: LEFT HEART CATH AND CORONARY ANGIOGRAPHY;  Surgeon: Leonie Man, MD;  Location: [facility] CV LAB;  Service: Cardiovascular;  Laterality: N/A;       Family History  Problem Relation Age of Onset  . Diabetes Paternal Grandfather   . Hypertension Paternal Grandfather     Social History   Tobacco Use  . Smoking status: Current Every Day Smoker    Packs/day: 0.50    Years: 45.00    Pack years: 22.50  . Smokeless tobacco: Never Used  Substance Use Topics  . Alcohol use: Yes    Alcohol/week: 75.0 standard drinks    Types: 75 Shots of liquor per week    Comment: a fifth every other day, average 3/week  . Drug use: Yes    Types: Marijuana    Comment: occasionally    Home Medications Prior to Admission medications   Medication Sig Start Date End Date Taking? Authorizing Provider  aspirin 81 MG EC tablet Take 1 tablet (81 mg total) by mouth daily. Patient not taking: Reported on 10/13/2019 11/13/17   Ledora Bottcher, PA  atorvastatin (LIPITOR) 80 MG tablet Take 1 tablet (80 mg total) by mouth daily at 6 PM. Patient not taking: Reported on 10/13/2019 11/13/17   Ledora Bottcher, PA  carvedilol (COREG) 6.25 MG tablet Take 1 tablet (6.25 mg total) by mouth 2 (two) times daily with a meal. Patient not taking: Reported on 10/13/2019 11/13/17   Ledora Bottcher,  PA  diclofenac (VOLTAREN) 50 MG EC tablet Take 1 tablet (50 mg total) by mouth 2 (two) times daily. Patient not taking: Reported on 01/15/2017 06/23/16   Ashley Murrain, NP  HYDROcodone-acetaminophen (NORCO) 5-325 MG tablet Take 1 tablet by mouth every 6 (six) hours as needed. Patient not taking: Reported on 01/15/2017 06/23/16   Ashley Murrain, NP  lisinopril (PRINIVIL,ZESTRIL) 10 MG tablet Take 1 tablet (10 mg total) by mouth daily. Patient not taking: Reported on 10/13/2019 11/13/17   Ledora Bottcher, PA  Multiple Vitamin (MULTIVITAMIN WITH MINERALS)  TABS tablet Take 1 tablet by mouth daily. Patient not taking: Reported on 01/15/2017 07/22/13   Cater, Wynelle Bourgeois, MD  nitroGLYCERIN (NITROSTAT) 0.4 MG SL tablet Place 1 tablet (0.4 mg total) under the tongue every 5 (five) minutes as needed for chest pain. Patient not taking: Reported on 10/13/2019 11/13/17   Ledora Bottcher, PA  ticagrelor (BRILINTA) 90 MG TABS tablet Take 1 tablet (90 mg total) by mouth 2 (two) times daily. Patient not taking: Reported on 10/13/2019 11/13/17   Ledora Bottcher, PA    Allergies    Patient has no known allergies.  Review of Systems   Review of Systems  Constitutional: Negative for chills, diaphoresis and fever.  Respiratory: Positive for shortness of breath. Negative for cough.   Cardiovascular: Negative for chest pain and leg swelling.  Gastrointestinal: Negative for abdominal pain, diarrhea, nausea and vomiting.  Musculoskeletal: Negative for back pain.  Neurological: Negative for dizziness, syncope and weakness.  All other systems reviewed and are negative.   Physical Exam Updated Vital Signs BP (!) 165/87 (BP Location: Right Arm)   Pulse 79   Temp 98.5 F (36.9 C) (Oral)   Resp 20   Ht 6\' 2"  (1.88 m)   Wt 83.9 kg   SpO2 99%   BMI 23.75 kg/m   Physical Exam Vitals and nursing note reviewed.  Constitutional:      General: He is not in acute distress.    Appearance: He is well-developed. He is not diaphoretic.  HENT:     Head: Normocephalic and atraumatic.     Mouth/Throat:     Mouth: Mucous membranes are moist.     Pharynx: Oropharynx is clear.  Eyes:     Conjunctiva/sclera: Conjunctivae normal.  Cardiovascular:     Rate and Rhythm: Normal rate and regular rhythm.     Pulses: Normal pulses.          Radial pulses are 2+ on the right side and 2+ on the left side.       Posterior tibial pulses are 2+ on the right side and 2+ on the left side.     Heart sounds: Normal heart sounds.     Comments: Tactile temperature in the  extremities appropriate and equal bilaterally. Pulmonary:     Effort: Pulmonary effort is normal. No respiratory distress.     Breath sounds: Normal breath sounds.  Abdominal:     Palpations: Abdomen is soft.     Tenderness: There is no abdominal tenderness. There is no guarding.  Musculoskeletal:     Cervical back: Neck supple.     Right lower leg: No edema.     Left lower leg: No edema.  Lymphadenopathy:     Cervical: No cervical adenopathy.  Skin:    General: Skin is warm and dry.  Neurological:     Mental Status: He is alert.  Psychiatric:  Mood and Affect: Mood and affect normal.        Speech: Speech normal.        Behavior: Behavior normal.     ED Results / Procedures / Treatments   Labs (all labs ordered are listed, but only abnormal results are displayed) Labs Reviewed  CBC - Abnormal; Notable for the following components:      Result Value   Hemoglobin 11.8 (*)    HCT 37.7 (*)    All other components within normal limits  BASIC METABOLIC PANEL - Abnormal; Notable for the following components:   Creatinine, Ser 1.41 (*)    GFR calc non Af Amer 53 (*)    All other components within normal limits  URINALYSIS, ROUTINE W REFLEX MICROSCOPIC - Abnormal; Notable for the following components:   APPearance HAZY (*)    Protein, ur 30 (*)    All other components within normal limits    Hemoglobin  Date Value Ref Range Status  10/13/2019 11.8 (L) 13.0 - 17.0 g/dL Final  11/13/2017 11.7 (L) 13.0 - 17.0 g/dL Final  11/12/2017 12.0 (L) 13.0 - 17.0 g/dL Final  11/11/2017 13.3 13.0 - 17.0 g/dL Final   BUN  Date Value Ref Range Status  10/13/2019 16 8 - 23 mg/dL Final  11/13/2017 13 6 - 20 mg/dL Final  11/12/2017 15 6 - 20 mg/dL Final  11/11/2017 13 6 - 20 mg/dL Final   Creatinine, Ser  Date Value Ref Range Status  10/13/2019 1.41 (H) 0.61 - 1.24 mg/dL Final  11/13/2017 1.11 0.61 - 1.24 mg/dL Final  11/12/2017 1.02 0.61 - 1.24 mg/dL Final  11/11/2017 1.11  0.61 - 1.24 mg/dL Final     EKG EKG Interpretation  Date/Time:  Tuesday October 13 2019 12:22:39 EDT Ventricular Rate:  94 PR Interval:  172 QRS Duration: 90 QT Interval:  370 QTC Calculation: 462 R Axis:   83 Text Interpretation: Sinus rhythm with Premature atrial complexes Right atrial enlargement Minimal voltage criteria for LVH, may be normal variant ( Sokolow-Lyon ) Nonspecific ST abnormality Abnormal ECG no sig change from previous Confirmed by Charlesetta Shanks [phone]) on 10/14/2019 12:04:49 AM   Radiology DG Chest 2 View  Result Date: 10/13/2019 CLINICAL DATA:  Shortness of breath EXAM: CHEST - 2 VIEW COMPARISON:  11/11/2017 FINDINGS: The heart size is within normal limits. Soft tissue fullness within the right hilum is new from prior. No focal airspace consolidation, pleural effusion, or pneumothorax. The visualized skeletal structures are unremarkable. IMPRESSION: Interval development of masslike soft tissue fullness within the right hilum concerning for right hilar lymphadenopathy. Further evaluation with contrast-enhanced CT of the chest is recommended. Electronically Signed   By: Davina Poke D.O.   On: 10/13/2019 13:16   CT Chest W Contrast  Result Date: 10/13/2019 CLINICAL DATA:  Shortness of breath.  Abnormal chest x-ray EXAM: CT CHEST WITH CONTRAST TECHNIQUE: Multidetector CT imaging of the chest was performed during intravenous contrast administration. CONTRAST:  53mL OMNIPAQUE IOHEXOL 300 MG/ML  SOLN COMPARISON:  Chest x-ray 10/13/2019 FINDINGS: Cardiovascular: Scattered aortic calcifications. No aneurysm. Extensive coronary artery calcifications most pronounced in the left anterior descending and left circumflex arteries. Heart is normal size. Mediastinum/Nodes: Mediastinal and bilateral hilar adenopathy, right greater than left. Left AP window lymph node has a short axis diameter of 14 mm. Right hilar node has a short axis diameter of 2.8 cm. Left hilar lymph node has a  short axis diameter of 14 mm. Trachea and esophagus are unremarkable. Thyroid  unremarkable. Lungs/Pleura: Right lower lobe nodule measures 10 mm on image 97. Right middle lobe nodule measures 4 mm on image 94. No effusions or confluent airspace consolidation. Upper Abdomen: Diffuse fatty infiltration of the liver. Partially imaged is left hydronephrosis and delayed excretion of contrast from the left kidney. Musculoskeletal: Chest wall soft tissues are unremarkable. No acute bony abnormality. IMPRESSION: Mediastinal and bilateral hilar adenopathy, right greater than left. Differential considerations would include sarcoidosis or malignancy such as lymphoproliferative disorder or metastases. 10 mm right lower lobe nodule and 4 mm right middle lobe nodule. Non-contrast chest CT at 3-6 months is recommended. If the nodules are stable at time of repeat CT, then future CT at 18-24 months (from today's scan) is considered optional for low-risk patients, but is recommended for high-risk patients. This recommendation follows the consensus statement: Guidelines for Management of Incidental Pulmonary Nodules Detected on CT Images: From the Fleischner Society 2017; Radiology 2017; 284:228-243. Coronary artery disease. Aortic Atherosclerosis (ICD10-I70.0). Electronically Signed   By: Rolm Baptise M.D.   On: 10/13/2019 22:31    Procedures Procedures (including critical care time)  Medications Ordered in ED Medications  iohexol (OMNIPAQUE) 300 MG/ML solution 75 mL (75 mLs Intravenous Contrast Given 10/13/19 2211)    ED Course  I have reviewed the triage vital signs and the nursing notes.  Pertinent labs & imaging results that were available during my care of the patient were reviewed by me and considered in my medical decision making (see chart for details).    MDM Rules/Calculators/A&P                      Patient presents with several months of shortness of breath and fatigue. Patient is nontoxic appearing,  afebrile, not tachycardic, not tachypneic, not hypotensive, maintains excellent SPO2 on room air, and is in no apparent distress.   I have reviewed the patient's chart to obtain more information.  I reviewed and interpreted the patient's labs and radiological studies. Lung nodules noted on CT.  These findings and recommendation for further follow-up were discussed with the patient. Recommended PCP and pulmonology follow-up.  Referral given.  Vitals:   10/13/19 2030 10/13/19 2045 10/13/19 2115 10/13/19 2130  BP: (!) 178/98 (!) 179/89 (!) 162/86 (!) 177/96  Pulse: 91 89 84 90  Resp: (!) 25 (!) 25 (!) 23 16  Temp:      TempSrc:      SpO2: 97% 97% 97% 100%  Weight:      Height:         Final Clinical Impression(s) / ED Diagnoses Final diagnoses:  SOB (shortness of breath)  Lung nodules    Rx / DC Orders ED Discharge Orders         Ordered    Ambulatory referral to Pulmonology     10/13/19 2306           Lorayne Bender, PA-C 10/14/19 0006    Charlesetta Shanks, MD 10/14/19 [phone]

## 2019-10-13 NOTE — ED Notes (Signed)
Pt went to c-t 

## 2019-11-13 ENCOUNTER — Other Ambulatory Visit: Payer: Self-pay

## 2019-11-13 ENCOUNTER — Encounter: Payer: Self-pay | Admitting: Emergency Medicine

## 2019-11-13 ENCOUNTER — Ambulatory Visit: Payer: Medicaid Other | Admitting: Emergency Medicine

## 2019-11-13 VITALS — BP 136/74 | HR 69 | Temp 98.7°F | Ht 74.0 in | Wt 180.2 lb

## 2019-11-13 DIAGNOSIS — R0602 Shortness of breath: Secondary | ICD-10-CM | POA: Diagnosis not present

## 2019-11-13 DIAGNOSIS — F17200 Nicotine dependence, unspecified, uncomplicated: Secondary | ICD-10-CM | POA: Diagnosis not present

## 2019-11-13 DIAGNOSIS — R9389 Abnormal findings on diagnostic imaging of other specified body structures: Secondary | ICD-10-CM | POA: Diagnosis not present

## 2019-11-13 NOTE — Assessment & Plan Note (Signed)
Suspect that he does have underlying tobacco related lung disease, obstruction given his tobacco history.  He needs pulmonary function testing to better evaluate.  Consider also obstruction related to possible sarcoidosis given his abnormal CT chest which is also under evaluation.  I will hold off on bronchodilators at this time, get the pulmonary function testing information first and then decide on therapy.

## 2019-11-13 NOTE — Assessment & Plan Note (Signed)
Reviewed his scan in detail with him today.  There is a 1 cm peripheral nodule and bulky mediastinal lymphadenopathy.  Sarcoidosis is on the differential but cannot rule out malignancy.  I believe he needs EBUS and probably navigational bronchoscopy with an attempt to biopsy the peripheral nodule.  Depending on results we will decide next steps, PET scan, etc.  I will work on scheduling the procedure.

## 2019-11-13 NOTE — Progress Notes (Signed)
Subjective:    Patient ID: [name] [name], male    DOB: 08/17/1955, 64 y.o.   MRN: [id number]  HPI 64 year old man who presents for an initial visit.  History of tobacco 30 to 40 pack years, still smokes half a pack a day.  He has coronary artery disease PCI, hypertension, hyperlipidemia.  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  He is referred today for progressive exertional SOB and abnormal CT chest. Notes that his activity level has decreased some. He is now having dyspnea with simple walking. Can get SOB with shopping all worse over the last several months. Has had occasional cough w white mucous.  No CP.   CT chest from 10/13/2019 reviewed by me, shows bilateral mediastinal and hilar adenopathy right greater than left, a 10 mm right lower lobe nodule that is peripheral.  A 4 mm right middle lobe nodule.   Review of Systems  Past Medical History:  Diagnosis Date  . Brain tumor [facility])    diagnosed by MRI 62 months ago  . Coronary artery disease 2011   s/p BMS stent LAD, [facility] [place] [place], Dr Chauncey Cruel. [name]  . High cholesterol   . HTN (hypertension)   . Hyperlipidemia LDL goal <70 07/21/2013  . NSTEMI (non-ST elevated myocardial infarction) ([place]) 11/11/2017  . Smoker    1/2 ppd x >40 years      Family History  Problem Relation Age of Onset  . Diabetes Paternal Grandfather   . Hypertension Paternal Grandfather      Social History   Socioeconomic History  . Marital status: Single    Spouse name: Not on file  . Number of children: Not on file  . Years of education: Not on file  . Highest education level: Not on file  Occupational History  . Occupation: unemployed  Tobacco Use  . Smoking status: Current Every Day Smoker    Packs/day: 0.50    Years: 45.00    Pack years: 22.50  . Smokeless tobacco: Never Used  . Tobacco comment: smoking 2-3 cigarettes daily 11/13/19 [facility]   Substance  and Sexual Activity  . Alcohol use: Yes    Alcohol/week: 75.0 standard drinks    Types: 75 Shots of liquor per week    Comment: a fifth every other day, average 3/week  . Drug use: Yes    Types: Marijuana    Comment: occasionally  . Sexual activity: Yes  Other Topics Concern  . Not on file  Social History Narrative   From [place]    Family lives in [place] (brother, sister and mother)    Unemployed    Drinks 3 (1/5)ths in 1 week   Social Determinants of Health   Financial Resource Strain:   . Difficulty of Paying Living Expenses:   Food Insecurity:   . Worried About Charity fundraiser in the Last Year:   . Arboriculturist in the Last Year:   Transportation Needs:   . Film/video editor (Medical):   Marland Kitchen Lack of Transportation (Non-Medical):   Physical Activity:   . Days of Exercise per Week:   . Minutes of Exercise per Session:   Stress:   . Feeling of Stress :   Social Connections:   . Frequency of Communication with Friends and Family:   . Frequency of Social Gatherings with Friends and Family:   . Attends Religious Services:   .  Active Member of Clubs or Organizations:   . Attends Archivist Meetings:   Marland Kitchen Marital Status:   Intimate Partner Violence:   . Fear of Current or Ex-Partner:   . Emotionally Abused:   Marland Kitchen Physically Abused:   . Sexually Abused:      No Known Allergies   Outpatient Medications Prior to Visit  Medication Sig Dispense Refill  . aspirin 81 MG EC tablet Take 1 tablet (81 mg total) by mouth daily. (Patient not taking: Reported on 10/13/2019) 90 tablet 3  . atorvastatin (LIPITOR) 80 MG tablet Take 1 tablet (80 mg total) by mouth daily at 6 PM. (Patient not taking: Reported on 10/13/2019) 90 tablet 3  . carvedilol (COREG) 6.25 MG tablet Take 1 tablet (6.25 mg total) by mouth 2 (two) times daily with a meal. (Patient not taking: Reported on 10/13/2019) 180 tablet 3  . diclofenac (VOLTAREN) 50 MG EC tablet Take 1 tablet (50 mg total)  by mouth 2 (two) times daily. (Patient not taking: Reported on 01/15/2017) 15 tablet 0  . HYDROcodone-acetaminophen (NORCO) 5-325 MG tablet Take 1 tablet by mouth every 6 (six) hours as needed. (Patient not taking: Reported on 01/15/2017) 10 tablet 0  . lisinopril (PRINIVIL,ZESTRIL) 10 MG tablet Take 1 tablet (10 mg total) by mouth daily. (Patient not taking: Reported on 10/13/2019) 90 tablet 3  . Multiple Vitamin (MULTIVITAMIN WITH MINERALS) TABS tablet Take 1 tablet by mouth daily. (Patient not taking: Reported on 01/15/2017) 30 tablet 11  . nitroGLYCERIN (NITROSTAT) 0.4 MG SL tablet Place 1 tablet (0.4 mg total) under the tongue every 5 (five) minutes as needed for chest pain. (Patient not taking: Reported on 10/13/2019) 25 tablet 1  . ticagrelor (BRILINTA) 90 MG TABS tablet Take 1 tablet (90 mg total) by mouth 2 (two) times daily. (Patient not taking: Reported on 10/13/2019) 180 tablet 3   No facility-administered medications prior to visit.        Objective:   Physical Exam Vitals:   11/13/19 1326  BP: 136/74  Pulse: 69  Temp: 98.7 F (37.1 C)  TempSrc: Temporal  SpO2: 100%  Weight: 180 lb 3.2 oz (81.7 kg)  Height: 6\' 2"  (1.88 m)   Gen: Pleasant, well-nourished, in no distress,  normal affect  ENT: No lesions,  mouth clear,  oropharynx clear, no postnasal drip  Neck: No JVD, no stridor  Lungs: No use of accessory muscles, distant, no crackles or wheezing on normal respiration, no wheeze on forced expiration  Cardiovascular: RRR, heart sounds normal, no murmur or gallops, no peripheral edema  Musculoskeletal: No deformities, no cyanosis or clubbing  Neuro: alert, awake, non focal  Skin: Warm, no lesions or rash     Assessment & Plan:  SOB (shortness of breath) on exertion Suspect that he does have underlying tobacco related lung disease, obstruction given his tobacco history.  He needs pulmonary function testing to better evaluate.  Consider also obstruction related to  possible sarcoidosis given his abnormal CT chest which is also under evaluation.  I will hold off on bronchodilators at this time, get the pulmonary function testing information first and then decide on therapy.   Current smoker Discussed cessation with him today.  He is motivated to do so.  Doesn't have a plan in place currently.  For now is going to try to cut down and then we will discuss goals for setting a quit date and a future visit.  He is a good candidate for COVID-19 vaccine and  I encouraged him to do this today.  Abnormal CT scan, chest Reviewed his scan in detail with him today.  There is a 1 cm peripheral nodule and bulky mediastinal lymphadenopathy.  Sarcoidosis is on the differential but cannot rule out malignancy.  I believe he needs EBUS and probably navigational bronchoscopy with an attempt to biopsy the peripheral nodule.  Depending on results we will decide next steps, PET scan, etc.  I will work on scheduling the procedure.  Baltazar Apo, MD, PhD 11/13/2019, 2:05 PM [facility] [phone] or if no answer [phone]

## 2019-11-13 NOTE — Patient Instructions (Addendum)
Lab work will be ordered today Pulmonary function testing at your next office visit We will arrange for a bronchoscopy to further assess your pulmonary nodule and enlarged lymph nodes.  We will work on scheduling this and then give you the details.  You will need a designated driver to bring you to the hospital and then back home. Work hard on decreasing your cigarettes.  We will try to help you with this Work hard on decreasing your alcohol You would be a good candidate to get the COVID-19 vaccine when you are ready to do so Follow with Dr. Lamonte Sakai next available with full pulmonary function testing on the same day.

## 2019-11-13 NOTE — Addendum Note (Signed)
Addended by: Gavin Potters R on: 11/13/2019 03:58 PM   Modules accepted: Orders

## 2019-11-13 NOTE — Assessment & Plan Note (Signed)
Discussed cessation with him today.  He is motivated to do so.  Doesn't have a plan in place currently.  For now is going to try to cut down and then we will discuss goals for setting a quit date and a future visit.  He is a good candidate for COVID-19 vaccine and I encouraged him to do this today.

## 2019-11-13 NOTE — Addendum Note (Signed)
Addended by: Gavin Potters R on: 11/13/2019 03:37 PM   Modules accepted: Orders

## 2019-12-08 ENCOUNTER — Other Ambulatory Visit (HOSPITAL_COMMUNITY)
Admission: RE | Admit: 2019-12-08 | Discharge: 2019-12-08 | Disposition: A | Discharge status: Home / Self Care | Payer: Medicaid Other | Source: Ambulatory Visit | Attending: Emergency Medicine | Admitting: Emergency Medicine

## 2019-12-08 DIAGNOSIS — Z20822 Contact with and (suspected) exposure to covid-19: Secondary | ICD-10-CM | POA: Insufficient documentation

## 2019-12-08 DIAGNOSIS — Z01812 Encounter for preprocedural laboratory examination: Secondary | ICD-10-CM | POA: Diagnosis present

## 2019-12-08 LAB — SARS CORONAVIRUS 2 (TAT 6-24 HRS): SARS Coronavirus 2: NEGATIVE

## 2019-12-09 ENCOUNTER — Encounter (HOSPITAL_COMMUNITY): Payer: Self-pay | Admitting: Physician Assistant

## 2019-12-09 ENCOUNTER — Encounter (HOSPITAL_COMMUNITY): Payer: Self-pay | Admitting: Emergency Medicine

## 2019-12-09 ENCOUNTER — Other Ambulatory Visit: Payer: Self-pay

## 2019-12-09 ENCOUNTER — Telehealth: Payer: Self-pay | Admitting: Emergency Medicine

## 2019-12-09 DIAGNOSIS — R0602 Shortness of breath: Secondary | ICD-10-CM

## 2019-12-09 DIAGNOSIS — I214 Non-ST elevation (NSTEMI) myocardial infarction: Secondary | ICD-10-CM

## 2019-12-09 NOTE — Telephone Encounter (Signed)
It looks like Dr Ellyn Hack at [facility] did his cath and he never was seen afterwards. Agree that we need to postpone, have him seen by [facility] ASAP for risk evaluation. Please refer him to [facility] thanks.

## 2019-12-09 NOTE — Telephone Encounter (Signed)
Spoke with Jeneen Rinks. He was made aware of RB's recs. Will call scheduling to cancel the bronch. Spoke with central scheduling. Procedure has been cancelled.   Called patient to make him aware but the call went straight to VM. Will go ahead and place the referral.

## 2019-12-09 NOTE — Telephone Encounter (Signed)
Received call from Karoline Caldwell, [place] with Anesthesiology at [facility] [phone]) Pt is scheduled for Bronch with Dr Lamonte Sakai 6/18- this needs to be cancelled  Per Jeneen Rinks needs eval with cards first  The pt has hx of multiple MI- last in 2019 and and 2 stents placed  After this he never f/u with cards and was non compliant with cardiac meds  Will forward to Dr Lamonte Sakai to be aware and see if we need to refer pt to cards now  If you need to speak with Jeneen Rinks his number is listed above, thanks!

## 2019-12-09 NOTE — Progress Notes (Signed)
Pt denies any acute pulmonary issues. Pt denies chest pain. Pt denies being under the care of a cardiologist. Pt stated that he seeks primary care at [facility]. Pt denies having a stress test. Pt denies recent labs. Pt stated that he has not taken any medications in the last 2 years. Pt stated that the medication that he has is expired " I don't like taking medicine."  Nurse made pt aware of the consequences of stopping all medications. Nurse advised pt to follow up with MD.  Nurse made pt aware to continue to quarantine. Pt verbalized understanding of all pre-op instructions. PA, Anesthesiology, made aware of pt history.

## 2019-12-11 ENCOUNTER — Emergency Department (HOSPITAL_COMMUNITY)
Admission: EM | Admit: 2019-12-11 | Discharge: 2019-12-11 | Disposition: A | Discharge status: Home / Self Care | Payer: Medicaid Other | Attending: Emergency Medicine | Admitting: Emergency Medicine

## 2019-12-11 ENCOUNTER — Other Ambulatory Visit: Payer: Self-pay

## 2019-12-11 ENCOUNTER — Emergency Department (HOSPITAL_COMMUNITY): Payer: Medicaid Other

## 2019-12-11 ENCOUNTER — Ambulatory Visit (HOSPITAL_COMMUNITY): Admission: RE | Admit: 2019-12-11 | Payer: Medicaid Other | Source: Home / Self Care | Admitting: Emergency Medicine

## 2019-12-11 DIAGNOSIS — I2510 Atherosclerotic heart disease of native coronary artery without angina pectoris: Secondary | ICD-10-CM | POA: Diagnosis not present

## 2019-12-11 DIAGNOSIS — M5431 Sciatica, right side: Secondary | ICD-10-CM | POA: Insufficient documentation

## 2019-12-11 DIAGNOSIS — I119 Hypertensive heart disease without heart failure: Secondary | ICD-10-CM | POA: Diagnosis not present

## 2019-12-11 DIAGNOSIS — Z79899 Other long term (current) drug therapy: Secondary | ICD-10-CM | POA: Diagnosis not present

## 2019-12-11 DIAGNOSIS — R0600 Dyspnea, unspecified: Secondary | ICD-10-CM | POA: Diagnosis not present

## 2019-12-11 DIAGNOSIS — F17210 Nicotine dependence, cigarettes, uncomplicated: Secondary | ICD-10-CM | POA: Diagnosis not present

## 2019-12-11 DIAGNOSIS — Z9861 Coronary angioplasty status: Secondary | ICD-10-CM | POA: Insufficient documentation

## 2019-12-11 DIAGNOSIS — M545 Low back pain: Secondary | ICD-10-CM | POA: Diagnosis present

## 2019-12-11 HISTORY — DX: Dyspnea, unspecified: R06.00

## 2019-12-11 HISTORY — DX: Personal history of other diseases of the musculoskeletal system and connective tissue: Z87.39

## 2019-12-11 HISTORY — DX: Solitary pulmonary nodule: R91.1

## 2019-12-11 HISTORY — DX: Depression, unspecified: F32.A

## 2019-12-11 LAB — COMPREHENSIVE METABOLIC PANEL
ALT: 14 U/L (ref 0–44)
AST: 20 U/L (ref 15–41)
Albumin: 4 g/dL (ref 3.5–5.0)
Alkaline Phosphatase: 148 U/L — ABNORMAL HIGH (ref 38–126)
Anion gap: 10 (ref 5–15)
BUN: 16 mg/dL (ref 8–23)
CO2: 24 mmol/L (ref 22–32)
Calcium: 9.5 mg/dL (ref 8.9–10.3)
Chloride: 104 mmol/L (ref 98–111)
Creatinine, Ser: 1.6 mg/dL — ABNORMAL HIGH (ref 0.61–1.24)
GFR calc Af Amer: 53 mL/min — ABNORMAL LOW (ref >60)
GFR calc non Af Amer: 45 mL/min — ABNORMAL LOW (ref >60)
Glucose, Bld: 103 mg/dL — ABNORMAL HIGH (ref 70–99)
Potassium: 4.4 mmol/L (ref 3.5–5.1)
Sodium: 138 mmol/L (ref 135–145)
Total Bilirubin: 0.7 mg/dL (ref 0.3–1.2)
Total Protein: 7.8 g/dL (ref 6.5–8.1)

## 2019-12-11 LAB — URINALYSIS, ROUTINE W REFLEX MICROSCOPIC
Bilirubin Urine: NEGATIVE
Glucose, UA: NEGATIVE mg/dL
Hgb urine dipstick: NEGATIVE
Ketones, ur: NEGATIVE mg/dL
Leukocytes,Ua: NEGATIVE
Nitrite: NEGATIVE
Protein, ur: NEGATIVE mg/dL
Specific Gravity, Urine: 1.024 (ref 1.005–1.030)
pH: 5 (ref 5.0–8.0)

## 2019-12-11 LAB — CBC
HCT: 36.6 % — ABNORMAL LOW (ref 39.0–52.0)
Hemoglobin: 11 g/dL — ABNORMAL LOW (ref 13.0–17.0)
MCH: 25.9 pg — ABNORMAL LOW (ref 26.0–34.0)
MCHC: 30.1 g/dL (ref 30.0–36.0)
MCV: 86.3 fL (ref 80.0–100.0)
Platelets: 240 10*3/uL (ref 150–400)
RBC: 4.24 MIL/uL (ref 4.22–5.81)
RDW: 14.4 % (ref 11.5–15.5)
WBC: 6.3 10*3/uL (ref 4.0–10.5)
nRBC: 0 % (ref 0.0–0.2)

## 2019-12-11 SURGERY — VIDEO BRONCHOSCOPY WITH ENDOBRONCHIAL NAVIGATION
Anesthesia: Monitor Anesthesia Care

## 2019-12-11 MED ORDER — OXYCODONE-ACETAMINOPHEN 5-325 MG PO TABS
1.0000 | ORAL_TABLET | ORAL | Status: DC | PRN
  Administered 2019-12-11: 1 via ORAL
  Filled 2019-12-11: qty 1

## 2019-12-11 MED ORDER — LIDOCAINE 5 % EX PTCH: 1.0000 | MEDICATED_PATCH | CUTANEOUS | 0 refills | Status: DC

## 2019-12-11 NOTE — ED Provider Notes (Signed)
[facility] EMERGENCY DEPARTMENT Provider Note   CSN: [id number] Arrival date & time: 12/11/19  4098     History Chief Complaint  Patient presents with  . Back Pain    [name] [name] is a 64 y.o. male with pertinent past medical history of meningioma, coronary artery disease status post stent, high cholesterol, hypertension, hyperlipidemia, recent diagnosis of lung nodule with suspicion of sarcoidosis that presents emergency department today for right back pain that radiates down into his leg.  States that back pain started 2 weeks ago and has been constant.  States that he has been diagnosed with sciatica from his herniated disc about 10 years ago, states that it causes him pain every now and then, states that he came into the emergency department today because he needs some relief while sleeping since this is been going on for 2 weeks now.  States that pain is similar to his previous sciatic episodes.  Improved by laying down in a supine position.  States that the pain causes his leg to feel numb and causes tingling whenever he does have the pain.  Denies any nausea, vomiting, fevers, incontinence, retention, dysuria, hematuria, IV drug use, saddle paresthesias.  Denies any chest pain, shortness of breath, abdominal pain, back pain elsewhere, headache.  Patient states that he was supposed to get bronchoscopy for further evaluation of his lung nodule today but it was canceled.  Denies any URI-like symptoms, has been Covid vaccinated. HPI     Past Medical History:  Diagnosis Date  . Brain tumor [facility])    diagnosed by MRI 6 months ago  . Coronary artery disease 2011   s/p BMS stent LAD, [facility], [facility] [place], [place], Dr Chauncey Cruel. [name]  . Depression   . Dyspnea   . High cholesterol   . History of herniated intervertebral disc   . HTN (hypertension)   . Hyperlipidemia LDL goal <70 07/21/2013  . Lung nodule   . NSTEMI (non-ST elevated myocardial infarction) ([place])  11/11/2017  . Smoker    1/2 ppd x >40 years     Patient Active Problem List   Diagnosis Date Noted  . Abnormal CT scan, chest 11/13/2019  . Chronic diastolic heart failure ([place]) 11/13/2017  . NSTEMI (non-ST elevated myocardial infarction) ([place]) 11/11/2017  . SOB (shortness of breath) on exertion 07/21/2013  . Dizziness 07/21/2013  . Essential hypertension 07/21/2013  . Current smoker 07/21/2013  . Alcohol abuse 07/21/2013  . CAD S/P percutaneous coronary angioplasty 07/21/2013  . H/O medication noncompliance 07/21/2013  . Hyperlipidemia LDL goal <70 07/21/2013    Past Surgical History:  Procedure Laterality Date  . CORONARY ANGIOPLASTY WITH STENT PLACEMENT Left 2011   BMS LAD, [facility], [facility] [place], [place], Dr Chauncey Cruel. [name]  . CORONARY STENT INTERVENTION N/A 11/12/2017   Procedure: CORONARY STENT INTERVENTION;  Surgeon: Leonie Man, MD;  Location: [facility];  Service: Cardiovascular;  Laterality: N/A;  . LEFT HEART CATH AND CORONARY ANGIOGRAPHY N/A 11/12/2017   Procedure: LEFT HEART CATH AND CORONARY ANGIOGRAPHY;  Surgeon: Leonie Man, MD;  Location: [facility];  Service: Cardiovascular;  Laterality: N/A;       Family History  Problem Relation Age of Onset  . Diabetes Paternal Grandfather   . Hypertension Paternal Grandfather     Social History   Tobacco Use  . Smoking status: Current Every Day Smoker    Packs/day: 0.25    Years: 45.00    Pack years:  11.25  . Smokeless tobacco: Never Used  Vaping Use  . Vaping Use: Never used  Substance Use Topics  . Alcohol use: Yes    Alcohol/week: 75.0 standard drinks    Types: 75 Shots of liquor per week    Comment: 2 shots of liquor daily  . Drug use: Not Currently    Types: Marijuana    Comment: occasionally    Home Medications Prior to Admission medications   Medication Sig Start Date End Date Taking? Authorizing Provider  aspirin 81 MG EC tablet Take 1 tablet (81 mg total) by mouth  daily. Patient not taking: Reported on 10/13/2019 11/13/17   Ledora Bottcher, PA  atorvastatin (LIPITOR) 80 MG tablet Take 1 tablet (80 mg total) by mouth daily at 6 PM. 11/13/17   Duke, Tami Lin, PA  carvedilol (COREG) 6.25 MG tablet Take 1 tablet (6.25 mg total) by mouth 2 (two) times daily with a meal. 11/13/17   Duke, Tami Lin, PA  diclofenac (VOLTAREN) 50 MG EC tablet Take 1 tablet (50 mg total) by mouth 2 (two) times daily. 06/23/16   Ashley Murrain, NP  HYDROcodone-acetaminophen (NORCO) 5-325 MG tablet Take 1 tablet by mouth every 6 (six) hours as needed. Patient not taking: Reported on 12/09/2019 06/23/16   Ashley Murrain, NP  lidocaine (LIDODERM) 5 % Place 1 patch onto the skin daily. Remove & Discard patch within 12 hours or as directed by MD 12/11/19   Alfredia Client, PA-C  lisinopril (PRINIVIL,ZESTRIL) 10 MG tablet Take 1 tablet (10 mg total) by mouth daily. 11/13/17   Duke, Tami Lin, PA  Multiple Vitamin (MULTIVITAMIN WITH MINERALS) TABS tablet Take 1 tablet by mouth daily. Patient not taking: Reported on 01/15/2017 07/22/13   Cater, Wynelle Bourgeois, MD  nitroGLYCERIN (NITROSTAT) 0.4 MG SL tablet Place 1 tablet (0.4 mg total) under the tongue every 5 (five) minutes as needed for chest pain. 11/13/17   Ledora Bottcher, PA  ticagrelor (BRILINTA) 90 MG TABS tablet Take 1 tablet (90 mg total) by mouth 2 (two) times daily. 11/13/17   Ledora Bottcher, PA    Allergies    Patient has no known allergies.  Review of Systems   Review of Systems  Constitutional: Negative for chills, diaphoresis, fatigue and fever.  HENT: Negative for congestion, sore throat and trouble swallowing.   Eyes: Negative for pain and visual disturbance.  Respiratory: Negative for cough, shortness of breath and wheezing.   Cardiovascular: Negative for chest pain, palpitations and leg swelling.  Gastrointestinal: Negative for abdominal distention, abdominal pain, diarrhea, nausea and vomiting.  Genitourinary:  Negative for difficulty urinating.  Musculoskeletal: Positive for back pain. Negative for neck pain and neck stiffness.  Skin: Negative for pallor.  Neurological: Negative for dizziness, speech difficulty, weakness and headaches.  Psychiatric/Behavioral: Negative for confusion.    Physical Exam Updated Vital Signs BP (!) 145/84   Pulse 64   Temp 98.3 F (36.8 C) (Oral)   Resp 16   Ht 6' 2"  (1.88 m)   Wt 81.6 kg   SpO2 97%   BMI 23.11 kg/m   Physical Exam Constitutional:      General: He is not in acute distress.    Appearance: Normal appearance. He is not ill-appearing, toxic-appearing or diaphoretic.  HENT:     Mouth/Throat:     Mouth: Mucous membranes are moist.     Pharynx: Oropharynx is clear.  Eyes:     General: No visual field deficit or scleral icterus.  Extraocular Movements: Extraocular movements intact.     Pupils: Pupils are equal, round, and reactive to light.  Cardiovascular:     Rate and Rhythm: Normal rate and regular rhythm.     Pulses: Normal pulses.     Heart sounds: Normal heart sounds.  Pulmonary:     Effort: Pulmonary effort is normal. No respiratory distress.     Breath sounds: Normal breath sounds. No stridor. No wheezing, rhonchi or rales.  Chest:     Chest wall: No tenderness.  Abdominal:     General: Abdomen is flat. There is no distension.     Palpations: Abdomen is soft.     Tenderness: There is no abdominal tenderness. There is no right CVA tenderness, left CVA tenderness, guarding or rebound.  Musculoskeletal:        General: No swelling or tenderness. Normal range of motion.     Cervical back: Normal range of motion and neck supple. No rigidity.     Right lower leg: No edema.     Left lower leg: No edema.     Comments: Patient without any cervical, thoracic, or lumbar, sacral midline tenderness.  No paraspinal muscle tenderness.  Patient states that pain starts in his right hip and radiates down, no tenderness or erythema or signs  infection on right hip.  Positive straight leg raise on right side.  Skin:    General: Skin is warm and dry.     Capillary Refill: Capillary refill takes less than 2 seconds.     Coloration: Skin is not pale.  Neurological:     General: No focal deficit present.     Mental Status: He is alert and oriented to person, place, and time.     Cranial Nerves: No cranial nerve deficit, dysarthria or facial asymmetry.     Sensory: No sensory deficit.     Motor: No weakness, tremor or atrophy.     Coordination: Coordination is intact. Coordination normal. Rapid alternating movements normal.     Gait: Gait normal.     Deep Tendon Reflexes: Reflexes normal.  Psychiatric:        Mood and Affect: Mood normal.        Behavior: Behavior normal.     ED Results / Procedures / Treatments   Labs (all labs ordered are listed, but only abnormal results are displayed) Labs Reviewed  CBC - Abnormal; Notable for the following components:      Result Value   Hemoglobin 11.0 (*)    HCT 36.6 (*)    MCH 25.9 (*)    All other components within normal limits  COMPREHENSIVE METABOLIC PANEL - Abnormal; Notable for the following components:   Glucose, Bld 103 (*)    Creatinine, Ser 1.60 (*)    Alkaline Phosphatase 148 (*)    GFR calc non Af Amer 45 (*)    GFR calc Af Amer 53 (*)    All other components within normal limits  URINALYSIS, ROUTINE W REFLEX MICROSCOPIC    EKG None  Radiology DG Chest Portable 1 View  Result Date: 12/11/2019 CLINICAL DATA:  Shortness of breath, smoker. EXAM: PORTABLE CHEST 1 VIEW COMPARISON:  Chest radiograph 10/13/2019, CT chest 10/13/2019 FINDINGS: Stable cardiomediastinal contours with bulky hilar adenopathy. There is no new focal opacity in the bilateral lungs. A small nodule in the peripheral right lung again noted. No pneumothorax or large pleural effusion. No acute finding in the visualized skeleton. IMPRESSION: 1. No acute cardiopulmonary finding. 2. Bulky hilar  adenopathy appears grossly unchanged compared to recent prior studies. Electronically Signed   By: Audie Pinto M.D.   On: 12/11/2019 09:21    Procedures Procedures (including critical care time)  Medications Ordered in ED Medications  oxyCODONE-acetaminophen (PERCOCET/ROXICET) 5-325 MG per tablet 1 tablet (1 tablet Oral Given 12/11/19 6606)    ED Course  I have reviewed the triage vital signs and the nursing notes.  Pertinent labs & imaging results that were available during my care of the patient were reviewed by me and considered in my medical decision making (see chart for details).    MDM Rules/Calculators/A&P                          [name] [name] is a 64 y.o. male with pertinent past medical history of meningioma, coronary artery disease status post stent, high cholesterol, hypertension, hyperlipidemia, recent diagnosis of lung nodule with suspicion of sarcoidosis that presents emergency department today for right back pain that radiates down into his leg.  Patient with physical exam suggesting of sciatica, however with vitals and history of questionable lung nodule and meningioma will  lab work today , along with chest x-ray and urinalysis.  Patient states that he has not been feeling fevers.  No midline tenderness my back.  Patient's pain being controlled with Percocet.  Upon reassessment, vitals have improved.  States that pain is significantly improved.  Patient is ambulatory in the ER.  Repeat neuro exam negative.  Temperature is now 98.3.  Patient with stable CBC and CMP.  Chest x-ray without any changes and urine negative for UTI.  Creatinine 1.6, this is elevated from 1.41 a month ago.  Alk phos also elevated to 148, will have patient recheck those labs in 1 week with primary care.  Patient agreeable.  Patient without any abdominal pain, normal bili and normal AST and ALT.   Patient is nontoxic appearing,. Patient has normal neurologic exam, no point/focal midline  tenderness to palpation. Ambulatory in the ED.  No back pain red flags.  Patient admits to numbness and tingling, however on exam patient does not have any weakness or sensation changes.  No urinary sxs. Considered UTI/pyelonephritis, kidney stone, aortic aneurysm/dissection, cauda equina or epidural abscess however these do not feel these diagnoses fit clinical picture at this time. Provided opportunity for questions, patient confirmed understanding and is in agreement with plan. Doubt need for further emergent work up at this time. I explained the diagnosis and have given explicit precautions to return to the ER including for any other new or worsening symptoms. The patient understands and accepts the medical plan as it's been dictated and I have answered their questions. Discharge instructions concerning home care and prescriptions have been given. The patient is STABLE and is discharged to home in good condition.  While patient follow-up with PCP, expressed the importance of this.  Patient agreeable.  I discussed this case with my attending physician who cosigned this note including patient's presenting symptoms, physical exam, and planned diagnostics and interventions. Attending physician stated agreement with plan or made changes to plan which were implemented.   Attending physician assessed patient at bedside.  Final Clinical Impression(s) / ED Diagnoses Final diagnoses:  Sciatica of right side    Rx / DC Orders ED Discharge Orders         Ordered    lidocaine (LIDODERM) 5 %  Every 24 hours     Discontinue  Reprint  12/11/19 [place], Torry Adamczak, PA-C 12/11/19 6619    Dorie Rank, MD 12/12/19 [phone]

## 2019-12-11 NOTE — ED Notes (Signed)
Patient verbalizes understanding of discharge instructions. Opportunity for questioning and answers were provided. Pt discharged from ED. 

## 2019-12-11 NOTE — Discharge Instructions (Addendum)
You were seen today for sciatica, I want you to use the attached guides.  I also want you to follow-up with your primary care.  I want you to use the Lidoderm patches as needed for your back pain.  You can also use over-the-counter anti-inflammatories as we discussed.  Come back to the emergency department if you have any new or worse concerning symptoms.  Want you to follow-up on your creatinine and your alk phos in the next week with your primary care as discussed.  Self - care:  The application of heat can help soothe the pain.  Maintaining your daily activities, including walking, is encourged, as it will help you get better faster than just staying in bed.  Medications are also useful to help with pain control.  A commonly prescribed medications includes acetaminophen.  This medication is generally safe, though you should not take more than 8 of the extra strength (575m) pills a day.  Non steroidal anti inflammatory medications including Ibuprofen and naproxen;  These medications help both pain and swelling and are very useful in treating back pain.  They should be taken with food, as they can cause stomach upset, and more seriously, stomach bleeding.     You will need to follow up with  Your primary healthcare provider in 1-2 weeks for reassessment.  Be aware that if you develop new symptoms, such as a fever, leg weakness, difficulty with or loss of control of your urine or bowels, abdominal pain, or more severe pain, you will need to seek medical attention and  / or return to the Emergency department.  If you do not have a doctor see the list below.

## 2019-12-11 NOTE — ED Provider Notes (Signed)
Medical screening examination/treatment/procedure(s) were conducted as a shared visit with non-physician practitioner(s) and myself.  I personally evaluated the patient during the encounter.   Pt with complaints of sciatica type pain.  Low grade temp noted.  On my exam, no weakness, normal sensation, able to lift leg off the bed without difficulty.  No swelling lumbar region. No focal ttp spine.  Doubt discitis, abscess.  Safe to hold off on MRI at this point.  Will check ua and cxr.   Dorie Rank, MD 12/11/19 [phone]

## 2019-12-11 NOTE — ED Triage Notes (Signed)
Patient was dx with a herniated disc 10-15 years and c/o pain in this back that radiates down his buttocks - causing some numbness. States he "googled it and it's sciatica".

## 2019-12-23 ENCOUNTER — Other Ambulatory Visit: Payer: Self-pay

## 2019-12-23 ENCOUNTER — Encounter: Payer: Self-pay | Admitting: Cardiovascular Disease

## 2019-12-23 ENCOUNTER — Ambulatory Visit: Payer: Medicaid Other | Admitting: Cardiovascular Disease

## 2019-12-23 VITALS — BP 132/82 | HR 86 | Ht 74.0 in | Wt 173.0 lb

## 2019-12-23 DIAGNOSIS — I214 Non-ST elevation (NSTEMI) myocardial infarction: Secondary | ICD-10-CM | POA: Diagnosis not present

## 2019-12-23 DIAGNOSIS — E785 Hyperlipidemia, unspecified: Secondary | ICD-10-CM

## 2019-12-23 DIAGNOSIS — I10 Essential (primary) hypertension: Secondary | ICD-10-CM

## 2019-12-23 DIAGNOSIS — Z9861 Coronary angioplasty status: Secondary | ICD-10-CM

## 2019-12-23 DIAGNOSIS — I2510 Atherosclerotic heart disease of native coronary artery without angina pectoris: Secondary | ICD-10-CM

## 2019-12-23 DIAGNOSIS — R0602 Shortness of breath: Secondary | ICD-10-CM | POA: Diagnosis not present

## 2019-12-23 MED ORDER — CLOPIDOGREL BISULFATE 75 MG PO TABS: 75.0000 mg | ORAL_TABLET | Freq: Every day | ORAL | 3 refills | Status: DC

## 2019-12-23 NOTE — Assessment & Plan Note (Signed)
History of ongoing tobacco abuse of 4 to 5 cigarettes a day down from a pack a day 6 months ago.  He smoked for the past 45 years.

## 2019-12-23 NOTE — Assessment & Plan Note (Signed)
History of essential hypertension a blood pressure measured today 132/82.  He is on carvedilol and lisinopril.

## 2019-12-23 NOTE — Progress Notes (Signed)
12/23/2019 [name] [name]   1956/01/12  [id number]  Primary Physician Placey, Audrea Muscat, NP Primary Cardiologist: Lorretta Harp MD Lupe Carney, [place]  HPI:  [name] [name] is a 64 y.o. thin appearing single African-American male father of 2 children, grandfather 3 grandchildren who currently does not work.  I last saw him when he was in the hospital May 2019 admitted with a non-STEMI.  His risk factors include 45-pack-year tobacco abuse currently smoking 4 to 5 cigarettes a day down from a pack a day 6 months ago.  History of hypertension hyperlipidemia.  He had a stent implanted in his LAD 04/05/2010 at [facility] (MultiLink vision).  He was admitted to [facility] 11/11/2017 with chest pain and non-STEMI and underwent cardiac catheterization by Dr. Ellyn Hack the following day revealing patent LAD stent with high-grade tandem lesions in the circumflex obtuse marginal branch which were stented with overlapping synergy drug-eluting stents.  The distal lesion in the AV groove was left untreated.  He had normal LV function.  Her last several months he is noted increasing dyspnea on exertion but denies chest pain.   No outpatient medications have been marked as taking for the 12/23/19 encounter (Office Visit) with Lorretta Harp, MD.     No Known Allergies  Social History   Socioeconomic History  . Marital status: Single    Spouse name: Not on file  . Number of children: Not on file  . Years of education: Not on file  . Highest education level: Not on file  Occupational History  . Occupation: unemployed  Tobacco Use  . Smoking status: Current Every Day Smoker    Packs/day: 0.25    Years: 45.00    Pack years: 11.25  . Smokeless tobacco: Never Used  Vaping Use  . Vaping Use: Never used  Substance and Sexual Activity  . Alcohol use: Yes    Alcohol/week: 75.0 standard drinks    Types: 75 Shots of liquor per week    Comment: 2 shots of liquor daily    . Drug use: Not Currently    Types: Marijuana    Comment: occasionally  . Sexual activity: Yes  Other Topics Concern  . Not on file  Social History Narrative   From [place]    Family lives in [place] (brother, sister and mother)    Unemployed    Drinks 3 (1/5)ths in 1 week   Social Determinants of Health   Financial Resource Strain:   . Difficulty of Paying Living Expenses:   Food Insecurity:   . Worried About Charity fundraiser in the Last Year:   . Arboriculturist in the Last Year:   Transportation Needs:   . Film/video editor (Medical):   Marland Kitchen Lack of Transportation (Non-Medical):   Physical Activity:   . Days of Exercise per Week:   . Minutes of Exercise per Session:   Stress:   . Feeling of Stress :   Social Connections:   . Frequency of Communication with Friends and Family:   . Frequency of Social Gatherings with Friends and Family:   . Attends Religious Services:   . Active Member of Clubs or Organizations:   . Attends Archivist Meetings:   Marland Kitchen Marital Status:   Intimate Partner Violence:   . Fear of Current or Ex-Partner:   . Emotionally Abused:   Marland Kitchen Physically Abused:   . Sexually Abused:  Review of Systems: General: negative for chills, fever, night sweats or weight changes.  Cardiovascular: negative for chest pain, dyspnea on exertion, edema, orthopnea, palpitations, paroxysmal nocturnal dyspnea or shortness of breath Dermatological: negative for rash Respiratory: negative for cough or wheezing Urologic: negative for hematuria Abdominal: negative for nausea, vomiting, diarrhea, bright red blood per rectum, melena, or hematemesis Neurologic: negative for visual changes, syncope, or dizziness All other systems reviewed and are otherwise negative except as noted above.    Blood pressure 132/82, pulse 86, height 6\' 2"  (1.88 m), weight 173 lb (78.5 kg), SpO2 99 %.  General appearance: alert and no distress Neck: no adenopathy, no  carotid bruit, no JVD, supple, symmetrical, trachea midline and thyroid not enlarged, symmetric, no tenderness/mass/nodules Lungs: clear to auscultation bilaterally Heart: regular rate and rhythm, S1, S2 normal, no murmur, click, rub or gallop Extremities: extremities normal, atraumatic, no cyanosis or edema Pulses: 2+ and symmetric Skin: Skin color, texture, turgor normal. No rashes or lesions Neurologic: Alert and oriented X 3, normal strength and tone. Normal symmetric reflexes. Normal coordination and gait  EKG sinus rhythm at 86 with borderline LVH voltage.  Personally reviewed this EKG.  ASSESSMENT AND PLAN:   Essential hypertension History of essential hypertension a blood pressure measured today 132/82.  He is on carvedilol and lisinopril.  Current smoker History of ongoing tobacco abuse of 4 to 5 cigarettes a day down from a pack a day 6 months ago.  He smoked for the past 45 years.  CAD S/P percutaneous coronary angioplasty History of CAD status post LAD stenting at [facility] 04/05/2010 with a MultiLink vision stent.  He was admitted to [facility] 11/11/2017 for non-STEMI and underwent cardiac catheterization the following day by Dr. Ellyn Hack revealing a patent LAD stent with moderate in-stent restenosis and high-grade tandem lesions in the first obtuse marginal branch which were stented with a 2.5 mm x 24 mm long Synergy drug-eluting stent with 2 overlapping stents.  He had normal LV function.  Over the last several months he is noted increasing dyspnea on exertion.  I am going to change him from Brilinta to Plavix, obtain a 2D echo and a Lexiscan Myoview stress test to further evaluate.  Hyperlipidemia LDL goal <70 History of hyperlipidemia on statin therapy.  We will recheck a lipid liver profile.      Lorretta Harp MD FACP,FACC,FAHA, [facility] 12/23/2019 3:04 PM

## 2019-12-23 NOTE — Assessment & Plan Note (Signed)
History of hyperlipidemia on statin therapy.  We will recheck a lipid liver profile 

## 2019-12-23 NOTE — Assessment & Plan Note (Signed)
History of CAD status post LAD stenting at [facility] 04/05/2010 with a MultiLink vision stent.  He was admitted to [facility] 11/11/2017 for non-STEMI and underwent cardiac catheterization the following day by Dr. Ellyn Hack revealing a patent LAD stent with moderate in-stent restenosis and high-grade tandem lesions in the first obtuse marginal branch which were stented with a 2.5 mm x 24 mm long Synergy drug-eluting stent with 2 overlapping stents.  He had normal LV function.  Over the last several months he is noted increasing dyspnea on exertion.  I am going to change him from Brilinta to Plavix, obtain a 2D echo and a Lexiscan Myoview stress test to further evaluate.

## 2019-12-23 NOTE — Patient Instructions (Signed)
Medication Instructions:  STOP BRILINTA  START PLAVIX OR CLOPIDOGREL 75 MG ONCE DAILY   *If you need a refill on your cardiac medications before your next appointment, please call your pharmacy*   Lab Work: Your physician recommends that you return for lab work PRIOR TO EATING  If you have labs (blood work) drawn today and your tests are completely normal, you will receive your results only by: Marland Kitchen MyChart Message (if you have MyChart) OR . A paper copy in the mail If you have any lab test that is abnormal or we need to change your treatment, we will call you to review the results.   Testing/Procedures: Your physician has requested that you have an echocardiogram. Echocardiography is a painless test that uses sound waves to create images of your heart. It provides your doctor with information about the size and shape of your heart and how well your heart's chambers and valves are working. This procedure takes approximately one hour. There are no restrictions for this procedure.[place] has requested that you have a lexiscan myoview. For further information please visit [email or web address]. Please follow instruction sheet, as given.[place]   Follow-Up: At [facility], you and your health needs are our priority.  As part of our continuing mission to provide you with exceptional heart care, we have created designated Provider Care Teams.  These Care Teams include your primary Cardiologist (physician) and Advanced Practice Providers (APPs -  Physician Assistants and Nurse Practitioners) who all work together to provide you with the care you need, when you need it.  We recommend signing up for the patient portal called "MyChart".  Sign up information is provided on this After Visit Summary.  MyChart is used to connect with patients for Virtual Visits (Telemedicine).  Patients are able to view lab/test results, encounter notes, upcoming  appointments, etc.  Non-urgent messages can be sent to your provider as well.   To learn more about what you can do with MyChart, go to [email or web address].    Your next appointment:    Your physician recommends that you schedule a follow-up appointment AFTER TESTING COMPLETED WITH DR Gwenlyn Found

## 2019-12-24 LAB — HEPATIC FUNCTION PANEL
ALT: 10 IU/L (ref 0–44)
AST: 14 IU/L (ref 0–40)
Albumin: 5.1 g/dL — ABNORMAL HIGH (ref 3.8–4.8)
Alkaline Phosphatase: 158 IU/L — ABNORMAL HIGH (ref 48–121)
Bilirubin Total: <0.2 mg/dL (ref 0.0–1.2)
Bilirubin, Direct: 0.06 mg/dL (ref 0.00–0.40)
Total Protein: 8.5 g/dL (ref 6.0–8.5)

## 2019-12-24 LAB — LIPID PANEL
Chol/HDL Ratio: 5.8 ratio — ABNORMAL HIGH (ref 0.0–5.0)
Cholesterol, Total: 244 mg/dL — ABNORMAL HIGH (ref 100–199)
HDL: 42 mg/dL (ref >39)
LDL Chol Calc (NIH): 175 mg/dL — ABNORMAL HIGH (ref 0–99)
Triglycerides: 146 mg/dL (ref 0–149)
VLDL Cholesterol Cal: 27 mg/dL (ref 5–40)

## 2019-12-25 ENCOUNTER — Other Ambulatory Visit: Payer: Self-pay

## 2019-12-25 MED ORDER — LISINOPRIL 10 MG PO TABS: 10.0000 mg | ORAL_TABLET | Freq: Every day | ORAL | 3 refills | Status: DC

## 2019-12-25 MED ORDER — ASPIRIN 81 MG PO TBEC: 81.0000 mg | DELAYED_RELEASE_TABLET | Freq: Every day | ORAL | 3 refills | Status: DC

## 2019-12-25 MED ORDER — CARVEDILOL 6.25 MG PO TABS: 6.2500 mg | ORAL_TABLET | Freq: Two times a day (BID) | ORAL | 3 refills | Status: DC

## 2019-12-25 MED ORDER — ATORVASTATIN CALCIUM 80 MG PO TABS: 80.0000 mg | ORAL_TABLET | Freq: Every day | ORAL | 3 refills | Status: DC

## 2020-01-06 ENCOUNTER — Telehealth (HOSPITAL_COMMUNITY): Payer: Self-pay | Admitting: *Deleted

## 2020-01-06 NOTE — Telephone Encounter (Signed)
Left message on voicemail per DPR in reference to upcoming appointment scheduled on 01/13/2020 at 1000 with detailed instructions given per Myocardial Perfusion Study Information Sheet for the test. LM to arrive 15 minutes early, and that it is imperative to arrive on time for appointment to keep from having the test rescheduled. If you need to cancel or reschedule your appointment, please call the office within 24 hours of your appointment. Failure to do so may result in a cancellation of your appointment, and a $50 no show fee. Phone number given for call back for any questions. No mychart available.Kayal Mula, Ranae [place]

## 2020-01-11 ENCOUNTER — Telehealth: Payer: Self-pay | Admitting: Cardiovascular Disease

## 2020-01-11 NOTE — Telephone Encounter (Signed)
Spoke with patient regarding appointment for myoview scheduled for 01/13/20---testing has been denied and is in appeal---patient rescheduled his Echo and follow up with Dr. Anson Crofts wait to hear regarding authorization before rescheduling [facility]

## 2020-01-13 ENCOUNTER — Other Ambulatory Visit (HOSPITAL_COMMUNITY): Payer: Medicaid Other

## 2020-01-13 ENCOUNTER — Encounter (HOSPITAL_COMMUNITY): Payer: Medicaid Other

## 2020-01-15 ENCOUNTER — Ambulatory Visit: Payer: Medicaid Other | Admitting: Cardiovascular Disease

## 2020-01-17 ENCOUNTER — Other Ambulatory Visit: Payer: Self-pay

## 2020-01-17 ENCOUNTER — Emergency Department (HOSPITAL_COMMUNITY)
Admission: EM | Admit: 2020-01-17 | Discharge: 2020-01-17 | Disposition: A | Discharge status: Home / Self Care | Payer: Medicaid Other | Attending: Emergency Medicine | Admitting: Emergency Medicine

## 2020-01-17 ENCOUNTER — Emergency Department (HOSPITAL_COMMUNITY): Payer: Medicaid Other

## 2020-01-17 ENCOUNTER — Encounter (HOSPITAL_COMMUNITY): Payer: Self-pay

## 2020-01-17 DIAGNOSIS — I2510 Atherosclerotic heart disease of native coronary artery without angina pectoris: Secondary | ICD-10-CM | POA: Diagnosis not present

## 2020-01-17 DIAGNOSIS — C61 Malignant neoplasm of prostate: Secondary | ICD-10-CM | POA: Diagnosis not present

## 2020-01-17 DIAGNOSIS — I5032 Chronic diastolic (congestive) heart failure: Secondary | ICD-10-CM | POA: Insufficient documentation

## 2020-01-17 DIAGNOSIS — Z79899 Other long term (current) drug therapy: Secondary | ICD-10-CM | POA: Diagnosis not present

## 2020-01-17 DIAGNOSIS — Z955 Presence of coronary angioplasty implant and graft: Secondary | ICD-10-CM | POA: Diagnosis not present

## 2020-01-17 DIAGNOSIS — F17210 Nicotine dependence, cigarettes, uncomplicated: Secondary | ICD-10-CM | POA: Diagnosis not present

## 2020-01-17 DIAGNOSIS — N131 Hydronephrosis with ureteral stricture, not elsewhere classified: Secondary | ICD-10-CM | POA: Insufficient documentation

## 2020-01-17 DIAGNOSIS — I252 Old myocardial infarction: Secondary | ICD-10-CM | POA: Insufficient documentation

## 2020-01-17 DIAGNOSIS — C799 Secondary malignant neoplasm of unspecified site: Secondary | ICD-10-CM | POA: Insufficient documentation

## 2020-01-17 DIAGNOSIS — N289 Disorder of kidney and ureter, unspecified: Secondary | ICD-10-CM | POA: Insufficient documentation

## 2020-01-17 DIAGNOSIS — Z7982 Long term (current) use of aspirin: Secondary | ICD-10-CM | POA: Diagnosis not present

## 2020-01-17 DIAGNOSIS — I110 Hypertensive heart disease with heart failure: Secondary | ICD-10-CM | POA: Diagnosis not present

## 2020-01-17 DIAGNOSIS — R634 Abnormal weight loss: Secondary | ICD-10-CM | POA: Diagnosis not present

## 2020-01-17 DIAGNOSIS — R9389 Abnormal findings on diagnostic imaging of other specified body structures: Secondary | ICD-10-CM | POA: Diagnosis present

## 2020-01-17 HISTORY — DX: Malignant (primary) neoplasm, unspecified: C80.1

## 2020-01-17 LAB — CBC WITH DIFFERENTIAL/PLATELET
Abs Immature Granulocytes: 0.01 10*3/uL (ref 0.00–0.07)
Basophils Absolute: 0 10*3/uL (ref 0.0–0.1)
Basophils Relative: 1 %
Eosinophils Absolute: 0.1 10*3/uL (ref 0.0–0.5)
Eosinophils Relative: 1 %
HCT: 34.6 % — ABNORMAL LOW (ref 39.0–52.0)
Hemoglobin: 10.5 g/dL — ABNORMAL LOW (ref 13.0–17.0)
Immature Granulocytes: 0 %
Lymphocytes Relative: 20 %
Lymphs Abs: 1.3 10*3/uL (ref 0.7–4.0)
MCH: 26.1 pg (ref 26.0–34.0)
MCHC: 30.3 g/dL (ref 30.0–36.0)
MCV: 85.9 fL (ref 80.0–100.0)
Monocytes Absolute: 0.5 10*3/uL (ref 0.1–1.0)
Monocytes Relative: 8 %
Neutro Abs: 4.4 10*3/uL (ref 1.7–7.7)
Neutrophils Relative %: 70 %
Platelets: 259 10*3/uL (ref 150–400)
RBC: 4.03 MIL/uL — ABNORMAL LOW (ref 4.22–5.81)
RDW: 15.1 % (ref 11.5–15.5)
WBC: 6.4 10*3/uL (ref 4.0–10.5)
nRBC: 0 % (ref 0.0–0.2)

## 2020-01-17 LAB — COMPREHENSIVE METABOLIC PANEL
ALT: 17 U/L (ref 0–44)
AST: 14 U/L — ABNORMAL LOW (ref 15–41)
Albumin: 3.7 g/dL (ref 3.5–5.0)
Alkaline Phosphatase: 116 U/L (ref 38–126)
Anion gap: 11 (ref 5–15)
BUN: 21 mg/dL (ref 8–23)
CO2: 24 mmol/L (ref 22–32)
Calcium: 9.2 mg/dL (ref 8.9–10.3)
Chloride: 103 mmol/L (ref 98–111)
Creatinine, Ser: 1.56 mg/dL — ABNORMAL HIGH (ref 0.61–1.24)
GFR calc Af Amer: 54 mL/min — ABNORMAL LOW (ref >60)
GFR calc non Af Amer: 47 mL/min — ABNORMAL LOW (ref >60)
Glucose, Bld: 103 mg/dL — ABNORMAL HIGH (ref 70–99)
Potassium: 4 mmol/L (ref 3.5–5.1)
Sodium: 138 mmol/L (ref 135–145)
Total Bilirubin: 0.5 mg/dL (ref 0.3–1.2)
Total Protein: 8.3 g/dL — ABNORMAL HIGH (ref 6.5–8.1)

## 2020-01-17 LAB — LACTATE DEHYDROGENASE: LDH: 156 U/L (ref 98–192)

## 2020-01-17 LAB — PHOSPHORUS: Phosphorus: 3.2 mg/dL (ref 2.5–4.6)

## 2020-01-17 LAB — PSA: Prostatic Specific Antigen: 1074.94 ng/mL — ABNORMAL HIGH (ref 0.00–4.00)

## 2020-01-17 MED ORDER — OXYCODONE-ACETAMINOPHEN 5-325 MG PO TABS
1.0000 | ORAL_TABLET | Freq: Once | ORAL | Status: AC
  Administered 2020-01-17: 1 via ORAL
  Filled 2020-01-17: qty 1

## 2020-01-17 MED ORDER — OXYCODONE HCL 10 MG PO TABS: 5.0000 mg | ORAL_TABLET | ORAL | 0 refills | Status: DC | PRN

## 2020-01-17 MED ORDER — IOHEXOL 300 MG/ML  SOLN
100.0000 mL | Freq: Once | INTRAMUSCULAR | Status: AC | PRN
  Administered 2020-01-17: 100 mL via INTRAVENOUS

## 2020-01-17 MED ORDER — IOHEXOL 9 MG/ML PO SOLN
ORAL | Status: AC
  Filled 2020-01-17: qty 1000

## 2020-01-17 MED ORDER — SODIUM CHLORIDE (PF) 0.9 % IJ SOLN
INTRAMUSCULAR | Status: AC
  Filled 2020-01-17: qty 50

## 2020-01-17 NOTE — ED Triage Notes (Signed)
Pt arrived ambulatory into ED CC abnormal MRI and herniated disc. Pt denies falls or other injury. VSS afebrile. Pt reports pain in right buttocks    Hx HTN, brain tumor

## 2020-01-17 NOTE — TOC Initial Note (Addendum)
Transition of Care [facility]) - Initial/Assessment Note    Patient Details  Name: [name] [name] MRN: [id number] Date of Birth: 1955/12/10  [facility] CM/SW Contact:    Verdell Carmine, RN Phone Number: 01/17/2020, 4:43 PM  Clinical Narrative:                  Patient in ED for worsening pain MRI revealed what looks like metastatic disease. CT of chest and abdomen revealed several spots of concern. Including prostate. Patient will need follow up to several MD appointments. I spoke to him about follow up and added Primary care to the list. I asked him about services and DM.  He is using a cane now and doing well. He does not get out except to MD appointments. Will call [facility] agencies for PT no DME needs at this time per patient.  This RNCM did express that staffing is limited with [facility] and he may have to do OP PT    Barriers to Discharge:  (transportation, needs DME)   Patient Goals and CMS Choice        Expected Discharge Plan and Services                                                Prior Living Arrangements/Services                       Activities of Daily Living      Permission Sought/Granted                  Emotional Assessment              Admission diagnosis:  Referred by doctor for herniated disc, possible cancer Patient Active Problem List   Diagnosis Date Noted  . Abnormal CT scan, chest 11/13/2019  . Chronic diastolic heart failure ([place]) 11/13/2017  . NSTEMI (non-ST elevated myocardial infarction) ([place]) 11/11/2017  . SOB (shortness of breath) on exertion 07/21/2013  . Dizziness 07/21/2013  . Essential hypertension 07/21/2013  . Current smoker 07/21/2013  . Alcohol abuse 07/21/2013  . CAD S/P percutaneous coronary angioplasty 07/21/2013  . H/O medication noncompliance 07/21/2013  . Hyperlipidemia LDL goal <70 07/21/2013   PCP:  [facility] Pharmacy:   [place]), [place] - [address] [place] [address] Phone: [phone] Fax: [place] [address] Phone: [phone] Fax: [place], [place] [address] Phone: [phone] Fax: [phone]     Social Determinants of Health (SDOH) Interventions    Readmission Risk Interventions No flowsheet data found.

## 2020-01-17 NOTE — TOC Progression Note (Signed)
Transition of Care [facility]) - Progression Note    Patient Details  Name: [name] [name] MRN: [id number] Date of Birth: 10-01-1955  Transition [facility] Care [facility]) CM/SW [place], RN Phone Number: 01/17/2020, 4:55 PM  Clinical Narrative:    Called advanced as they are the only provider currently contracted with Medicaid preplanned >They are not able to do it at this time. Dr.Kohut will write a order for outpatient PT.  Can see at primary if he can get personal care services a  Few hours a day through medicaid when gets to his PCP.      Barriers to Discharge:  (transportation, needs DME)  Expected Discharge Plan and Services                                                 Social Determinants of Health (SDOH) Interventions    Readmission Risk Interventions No flowsheet data found.

## 2020-01-17 NOTE — ED Provider Notes (Signed)
[facility]-EMERGENCY DEPT Provider Note   CSN: [id number] Arrival date & time: 01/17/20  1107     History Chief Complaint  Patient presents with  . Herniated Disc    PCP requests CT Scan     [name] [name] is a 64 y.o. male.  HPI He presents for evaluation of abnormal MRI, done 01/15/2020.  Patient saw his orthopedist that day, for right buttock pain and was sent for MRI imaging of the lumbar spine.  MRI results shows multiple abnormalities including: Diffuse bony metastases, pathologic right sacral ala fracture, L5-S1 left nerve root compression, and right L5 nerve root deviation, from  Metastases; as well as left hydronephrosis with source undefined and other degenerative disc abnormalities.  The MRI images were done, as an outpatient, at [facility].  The patient reports having right buttock pain for 2 months, which is aggravated by walking.  Has chronic history of "ruptured disc with back pain," for about 15 years.  He reports losing about 8 pounds in the last couple of months without trying to.  He denies fevers, chills, cough, shortness of breath, nausea, vomiting, focal weakness or paresthesia.  He lives alone.  He came here by cab for evaluation.  He was diagnosed with a meningioma, in July 2018, and referred to neurosurgery at that time.  He had a CT scan of the chest done in May 2021 that showed a nodule, this was apparently not followed up on, for unclear reasons.  He states that he does not currently have a primary care doctor.  He was previously receiving care at the [facility].  There are no other known modifying factors.    Past Medical History:  Diagnosis Date  . Brain tumor [facility])    diagnosed by MRI 6 months ago  . Cancer (HCC)    Pt reports brain tumor 2 years ago  . Coronary artery disease 2011   s/p BMS stent LAD, [facility], [place], [place], Dr Kathie Rhodes. [name]  . Depression   . Dyspnea   . High cholesterol   . History of herniated  intervertebral disc   . HTN (hypertension)   . Hyperlipidemia LDL goal <70 07/21/2013  . Lung nodule   . NSTEMI (non-ST elevated myocardial infarction) (HCC) 11/11/2017  . Smoker    1/2 ppd x >40 years     Patient Active Problem List   Diagnosis Date Noted  . Abnormal CT scan, chest 11/13/2019  . Chronic diastolic heart failure (HCC) 11/13/2017  . NSTEMI (non-ST elevated myocardial infarction) (HCC) 11/11/2017  . SOB (shortness of breath) on exertion 07/21/2013  . Dizziness 07/21/2013  . Essential hypertension 07/21/2013  . Current smoker 07/21/2013  . Alcohol abuse 07/21/2013  . CAD S/P percutaneous coronary angioplasty 07/21/2013  . H/O medication noncompliance 07/21/2013  . Hyperlipidemia LDL goal <70 07/21/2013    Past Surgical History:  Procedure Laterality Date  . CORONARY ANGIOPLASTY WITH STENT PLACEMENT Left 2011   BMS LAD, [facility], [place], [place], Dr Kathie Rhodes. [name]  . CORONARY STENT INTERVENTION N/A 11/12/2017   Procedure: CORONARY STENT INTERVENTION;  Surgeon: Marykay Lex, MD;  Location: [facility] INVASIVE CV LAB;  Service: Cardiovascular;  Laterality: N/A;  . LEFT HEART CATH AND CORONARY ANGIOGRAPHY N/A 11/12/2017   Procedure: LEFT HEART CATH AND CORONARY ANGIOGRAPHY;  Surgeon: Marykay Lex, MD;  Location: [facility] INVASIVE CV LAB;  Service: Cardiovascular;  Laterality: N/A;       Family History  Problem Relation Age of  Onset  . Diabetes Paternal Grandfather   . Hypertension Paternal Grandfather     Social History   Tobacco Use  . Smoking status: Current Every Day Smoker    Packs/day: 0.25    Years: 45.00    Pack years: 11.25  . Smokeless tobacco: Never Used  Vaping Use  . Vaping Use: Never used  Substance Use Topics  . Alcohol use: Yes    Alcohol/week: 75.0 standard drinks    Types: 75 Shots of liquor per week    Comment: 2 shots of liquor daily  . Drug use: Not Currently    Types: Marijuana    Comment: occasionally    Home  Medications Prior to Admission medications   Medication Sig Start Date End Date Taking? Authorizing Provider  aspirin 81 MG EC tablet Take 1 tablet (81 mg total) by mouth daily. 12/25/19  Yes Lorretta Harp, MD  atorvastatin (LIPITOR) 80 MG tablet Take 1 tablet (80 mg total) by mouth daily at 6 PM. 12/25/19  Yes Lorretta Harp, MD  clopidogrel (PLAVIX) 75 MG tablet Take 1 tablet (75 mg total) by mouth daily. 12/23/19  Yes Lorretta Harp, MD  gabapentin (NEURONTIN) 300 MG capsule Take 300 mg by mouth 3 (three) times daily as needed for pain. 01/04/20  Yes [provider]  lisinopril (ZESTRIL) 10 MG tablet Take 1 tablet (10 mg total) by mouth daily. 12/25/19  Yes Lorretta Harp, MD  carvedilol (COREG) 6.25 MG tablet Take 1 tablet (6.25 mg total) by mouth 2 (two) times daily with a meal. 12/25/19   Lorretta Harp, MD  diclofenac (VOLTAREN) 50 MG EC tablet Take 1 tablet (50 mg total) by mouth 2 (two) times daily. Patient not taking: Reported on 12/23/2019 06/23/16   Ashley Murrain, NP  HYDROcodone-acetaminophen (NORCO) 5-325 MG tablet Take 1 tablet by mouth every 6 (six) hours as needed. Patient not taking: Reported on 12/09/2019 06/23/16   Ashley Murrain, NP  lidocaine (LIDODERM) 5 % Place 1 patch onto the skin daily. Remove & Discard patch within 12 hours or as directed by MD Patient not taking: Reported on 12/23/2019 12/11/19   Alfredia Client, PA-C  Multiple Vitamin (MULTIVITAMIN WITH MINERALS) TABS tablet Take 1 tablet by mouth daily. Patient not taking: Reported on 01/15/2017 07/22/13   Cater, Wynelle Bourgeois, MD  nitroGLYCERIN (NITROSTAT) 0.4 MG SL tablet Place 1 tablet (0.4 mg total) under the tongue every 5 (five) minutes as needed for chest pain. Patient not taking: Reported on 12/23/2019 11/13/17   Ledora Bottcher, PA  oxyCODONE 10 MG TABS Take 0.5 tablets (5 mg total) by mouth every 4 (four) hours as needed for severe pain. 01/17/20   Daleen Bo, MD    Allergies    Patient has no  known allergies.  Review of Systems   Review of Systems  All other systems reviewed and are negative.   Physical Exam Updated Vital Signs BP (!) 141/83 (BP Location: Right Arm)   Pulse 60   Temp 98.1 F (36.7 C)   Resp 13   SpO2 99%   Physical Exam Vitals and nursing note reviewed.  Constitutional:      General: He is not in acute distress.    Appearance: He is well-developed. He is not ill-appearing, toxic-appearing or diaphoretic.  HENT:     Head: Normocephalic and atraumatic.     Right Ear: External ear normal.     Left Ear: External ear normal.  Eyes:  Conjunctiva/sclera: Conjunctivae normal.     Pupils: Pupils are equal, round, and reactive to light.  Neck:     Trachea: Phonation normal.  Cardiovascular:     Rate and Rhythm: Normal rate.  Pulmonary:     Effort: Pulmonary effort is normal.     Breath sounds: Normal breath sounds.  Abdominal:     General: There is no distension.     Palpations: Abdomen is soft.     Tenderness: There is no abdominal tenderness.  Genitourinary:    Penis: Normal.      Testes: Normal.  Musculoskeletal:        General: Normal range of motion.     Cervical back: Normal range of motion and neck supple.     Comments: He is able to move arms and legs equally.  He guards against movement of the lower back secondary to pain in the right buttock.  Skin:    General: Skin is warm and dry.  Neurological:     Mental Status: He is alert and oriented to person, place, and time.     Cranial Nerves: No cranial nerve deficit.     Sensory: No sensory deficit.     Motor: No abnormal muscle tone.     Coordination: Coordination normal.  Psychiatric:        Mood and Affect: Mood normal.        Behavior: Behavior normal.        Thought Content: Thought content normal.        Judgment: Judgment normal.     ED Results / Procedures / Treatments   Labs (all labs ordered are listed, but only abnormal results are displayed) Labs Reviewed   COMPREHENSIVE METABOLIC PANEL - Abnormal; Notable for the following components:      Result Value   Glucose, Bld 103 (*)    Creatinine, Ser 1.56 (*)    Total Protein 8.3 (*)    AST 14 (*)    GFR calc non Af Amer 47 (*)    GFR calc Af Amer 54 (*)    All other components within normal limits  CBC WITH DIFFERENTIAL/PLATELET - Abnormal; Notable for the following components:   RBC 4.03 (*)    Hemoglobin 10.5 (*)    HCT 34.6 (*)    All other components within normal limits  PSA - Abnormal; Notable for the following components:   Prostatic Specific Antigen 1,610.96 (*)    All other components within normal limits  LACTATE DEHYDROGENASE  PHOSPHORUS    EKG None  Radiology CT Chest W Contrast  Result Date: 01/17/2020 CLINICAL DATA:  64 year old male with history of unknown primary malignancy. EXAM: CT CHEST, ABDOMEN, AND PELVIS WITH CONTRAST TECHNIQUE: Multidetector CT imaging of the chest, abdomen and pelvis was performed following the standard protocol during bolus administration of intravenous contrast. CONTRAST:  140m OMNIPAQUE IOHEXOL 300 MG/ML  SOLN COMPARISON:  Chest CT 10/13/2019. No prior CT the abdomen and pelvis. FINDINGS: CT CHEST FINDINGS Cardiovascular: Heart size is normal. There is no significant pericardial fluid, thickening or pericardial calcification. There is aortic atherosclerosis, as well as atherosclerosis of the great vessels of the mediastinum and the coronary arteries, including calcified atherosclerotic plaque in the left anterior descending, left circumflex and right coronary arteries. Stents are present in the left anterior descending and left circumflex coronary arteries. Mediastinum/Nodes: Multiple enlarged mediastinal and bilateral hilar lymph nodes measuring up to 2.3 cm in short axis in the subcarinal nodal station (previously 2.1 cm), 2.5  cm in short axis in the right hilar nodal station (previously 2.8 cm), 1.6 cm in short axis in the left hilar nodal station  (previously 1.4 cm), and 1.7 cm in short axis in the low right paratracheal nodal station (previously 1.2 cm). Esophagus is unremarkable in appearance. No axillary lymphadenopathy. Lungs/Pleura: Multiple small pulmonary nodules are again noted throughout the lungs bilaterally, generally similar in size, number and distribution to the prior examination, largest of which is in the lateral aspect of the right lower lobe (axial image 93 of series 7) measuring 10 x 8 mm (mean diameter of 9 mm). Mild chronic scarring in the right middle lobe. No acute consolidative airspace disease. No pleural effusions. Diffuse bronchial wall thickening with mild centrilobular and paraseptal emphysema. Musculoskeletal: There are no aggressive appearing lytic or blastic lesions noted in the visualized portions of the skeleton. CT ABDOMEN PELVIS FINDINGS Hepatobiliary: Several low-attenuation lesions are again noted scattered throughout the hepatic parenchyma, largest of which measures only 1.1 cm in diameter, compatible with simple cysts. Other subcentimeter low-attenuation lesions in the liver, too small to characterize, but favored to represent tiny cysts. No definite suspicious hepatic lesions. No intra or extrahepatic biliary ductal dilatation. Gallbladder is normal in appearance. Pancreas: No pancreatic mass. No pancreatic ductal dilatation. No pancreatic or peripancreatic fluid collections or inflammatory changes. Spleen: Unremarkable. Adrenals/Urinary Tract: Moderate left hydroureteronephrosis. There is abrupt cut off of the left ureter at the junction of middle and distal thirds, presumably related to bulky left retroperitoneal lymphadenopathy. Right kidney and bilateral adrenal glands are normal in appearance. No right hydroureteronephrosis. Urinary bladder is unremarkable in appearance. Stomach/Bowel: The appearance of the stomach is normal. There is no pathologic dilatation of small bowel or colon. Normal appendix.  Vascular/Lymphatic: Aortic atherosclerosis, without evidence of aneurysm or dissection in the abdominal or pelvic vasculature. Ulcerated plaque in the infrarenal abdominal aorta (axial image 88 of series 3). Extensive lymphadenopathy noted in the retroperitoneum and along the left pelvic sidewall. This is well demonstrated on axial image 77 of series 3 where a left para-aortic lymph node measures up to 2.9 cm in short axis. The largest pelvic nodal mass measures 4.4 x 6.4 cm in the left external iliac nodal chain (axial image 113 of series 3). Reproductive: Prostate gland is enlarged measuring 5.7 x 5.7 x 6.3 cm. Seminal vesicles are unremarkable in appearance. Right-sided hydrocele incompletely imaged. Other: No significant volume of ascites.  No pneumoperitoneum. Musculoskeletal: Multiple mixed lytic and sclerotic lesions are noted in the bony pelvis, presumably reflective of multifocal metastatic disease. The largest of these is in the right-side of the sacrum measuring up to 7.4 x 5.1 cm (axial image 102 of series 3). IMPRESSION: 1. Extensive left pelvic and retroperitoneal lymphadenopathy concerning for lymphoma. However, the possibility of a primary testicular malignancy is not excluded. Correlation with testicular ultrasound should be considered if clinically appropriate. 2. Mediastinal and bilateral hilar lymphadenopathy redemonstrated, with minimal change compared to the prior examination, as above. 3. Previously noted small pulmonary nodules appear centrally stable compared to the prior examination. 4. Small right-sided hydrocele. 5. Aortic atherosclerosis, in addition to 3 vessel coronary artery disease. Please note that although the presence of coronary artery calcium documents the presence of coronary artery disease, the severity of this disease and any potential stenosis cannot be assessed on this non-gated CT examination. Assessment for potential risk factor modification, dietary therapy or  pharmacologic therapy may be warranted, if clinically indicated. 6. Mild diffuse bronchial wall thickening with mild centrilobular  and paraseptal emphysema; imaging findings suggestive of underlying COPD. 7. Additional incidental findings, as above. Electronically Signed   By: Vinnie Langton M.D.   On: 01/17/2020 15:12   CT Abdomen Pelvis W Contrast  Result Date: 01/17/2020 CLINICAL DATA:  64 year old male with history of unknown primary malignancy. EXAM: CT CHEST, ABDOMEN, AND PELVIS WITH CONTRAST TECHNIQUE: Multidetector CT imaging of the chest, abdomen and pelvis was performed following the standard protocol during bolus administration of intravenous contrast. CONTRAST:  13m OMNIPAQUE IOHEXOL 300 MG/ML  SOLN COMPARISON:  Chest CT 10/13/2019. No prior CT the abdomen and pelvis. FINDINGS: CT CHEST FINDINGS Cardiovascular: Heart size is normal. There is no significant pericardial fluid, thickening or pericardial calcification. There is aortic atherosclerosis, as well as atherosclerosis of the great vessels of the mediastinum and the coronary arteries, including calcified atherosclerotic plaque in the left anterior descending, left circumflex and right coronary arteries. Stents are present in the left anterior descending and left circumflex coronary arteries. Mediastinum/Nodes: Multiple enlarged mediastinal and bilateral hilar lymph nodes measuring up to 2.3 cm in short axis in the subcarinal nodal station (previously 2.1 cm), 2.5 cm in short axis in the right hilar nodal station (previously 2.8 cm), 1.6 cm in short axis in the left hilar nodal station (previously 1.4 cm), and 1.7 cm in short axis in the low right paratracheal nodal station (previously 1.2 cm). Esophagus is unremarkable in appearance. No axillary lymphadenopathy. Lungs/Pleura: Multiple small pulmonary nodules are again noted throughout the lungs bilaterally, generally similar in size, number and distribution to the prior examination, largest  of which is in the lateral aspect of the right lower lobe (axial image 93 of series 7) measuring 10 x 8 mm (mean diameter of 9 mm). Mild chronic scarring in the right middle lobe. No acute consolidative airspace disease. No pleural effusions. Diffuse bronchial wall thickening with mild centrilobular and paraseptal emphysema. Musculoskeletal: There are no aggressive appearing lytic or blastic lesions noted in the visualized portions of the skeleton. CT ABDOMEN PELVIS FINDINGS Hepatobiliary: Several low-attenuation lesions are again noted scattered throughout the hepatic parenchyma, largest of which measures only 1.1 cm in diameter, compatible with simple cysts. Other subcentimeter low-attenuation lesions in the liver, too small to characterize, but favored to represent tiny cysts. No definite suspicious hepatic lesions. No intra or extrahepatic biliary ductal dilatation. Gallbladder is normal in appearance. Pancreas: No pancreatic mass. No pancreatic ductal dilatation. No pancreatic or peripancreatic fluid collections or inflammatory changes. Spleen: Unremarkable. Adrenals/Urinary Tract: Moderate left hydroureteronephrosis. There is abrupt cut off of the left ureter at the junction of middle and distal thirds, presumably related to bulky left retroperitoneal lymphadenopathy. Right kidney and bilateral adrenal glands are normal in appearance. No right hydroureteronephrosis. Urinary bladder is unremarkable in appearance. Stomach/Bowel: The appearance of the stomach is normal. There is no pathologic dilatation of small bowel or colon. Normal appendix. Vascular/Lymphatic: Aortic atherosclerosis, without evidence of aneurysm or dissection in the abdominal or pelvic vasculature. Ulcerated plaque in the infrarenal abdominal aorta (axial image 88 of series 3). Extensive lymphadenopathy noted in the retroperitoneum and along the left pelvic sidewall. This is well demonstrated on axial image 77 of series 3 where a left  para-aortic lymph node measures up to 2.9 cm in short axis. The largest pelvic nodal mass measures 4.4 x 6.4 cm in the left external iliac nodal chain (axial image 113 of series 3). Reproductive: Prostate gland is enlarged measuring 5.7 x 5.7 x 6.3 cm. Seminal vesicles are unremarkable in appearance. Right-sided  hydrocele incompletely imaged. Other: No significant volume of ascites.  No pneumoperitoneum. Musculoskeletal: Multiple mixed lytic and sclerotic lesions are noted in the bony pelvis, presumably reflective of multifocal metastatic disease. The largest of these is in the right-side of the sacrum measuring up to 7.4 x 5.1 cm (axial image 102 of series 3). IMPRESSION: 1. Extensive left pelvic and retroperitoneal lymphadenopathy concerning for lymphoma. However, the possibility of a primary testicular malignancy is not excluded. Correlation with testicular ultrasound should be considered if clinically appropriate. 2. Mediastinal and bilateral hilar lymphadenopathy redemonstrated, with minimal change compared to the prior examination, as above. 3. Previously noted small pulmonary nodules appear centrally stable compared to the prior examination. 4. Small right-sided hydrocele. 5. Aortic atherosclerosis, in addition to 3 vessel coronary artery disease. Please note that although the presence of coronary artery calcium documents the presence of coronary artery disease, the severity of this disease and any potential stenosis cannot be assessed on this non-gated CT examination. Assessment for potential risk factor modification, dietary therapy or pharmacologic therapy may be warranted, if clinically indicated. 6. Mild diffuse bronchial wall thickening with mild centrilobular and paraseptal emphysema; imaging findings suggestive of underlying COPD. 7. Additional incidental findings, as above. Electronically Signed   By: Vinnie Langton M.D.   On: 01/17/2020 15:12    Procedures Procedures (including critical care  time)  Medications Ordered in ED Medications  oxyCODONE-acetaminophen (PERCOCET/ROXICET) 5-325 MG per tablet 1 tablet (1 tablet Oral Given 01/17/20 1233)  iohexol (OMNIPAQUE) 9 MG/ML oral solution (  Contrast Given 01/17/20 1258)  iohexol (OMNIPAQUE) 300 MG/ML solution 100 mL (100 mLs Intravenous Contrast Given 01/17/20 1406)  sodium chloride (PF) 0.9 % injection (  Given 01/17/20 1457)    ED Course  I have reviewed the triage vital signs and the nursing notes.  Pertinent labs & imaging results that were available during my care of the patient were reviewed by me and considered in my medical decision making (see chart for details).  Clinical Course as of Jan 17 1600  Sun Jan 17, 2020  1155 I discussed the case with Dr. Tonita Cong, orthopedics, who saw the patient in the office, 2 days ago.  He ordered the MRI , which was done 01/15/2020.  After that he discussed the case with oncology and neurology, then decided to send the patient to the ED to initiate an evaluation, and sure his medical needs were met.  Dr. Tonita Cong notes that the patient lives in "semiindigent housing, and has transportation difficulty."   [EW]  1451 Markedly elevated  PSA(!) [EW]  1451 Normal  Lactate dehydrogenase [EW]  1451 Normal except hemoglobin low  CBC with Differential(!) [EW]  1451 Normal  Phosphorus [EW]  1451 Normal except glucose high, creatinine elevated, total protein high, AST low, GFR low  Comprehensive metabolic panel(!) [EW]  1194 Total Protein(!): 8.3 [EW]  1531 AST(!): 14 [EW]  1532 I discussed case with Dr. Narda Rutherford, oncologist, who states that from his perspective this can be managed as an outpatient.  He was aware of this patient after his discussion with Dr. Maxie Better, earlier.   [EW]  1541 Case discussed with urologist, Dr. Carmie Kanner.  He states patient can be managed as an outpatient from a urologic perspective.  She requested an ambulatory referral, be placed to her service.  At that point they  can coordinate timing of prostate biopsy with medical oncology.   [EW]    Clinical Course User Index [EW] Daleen Bo, MD   MDM  Rules/Calculators/A&P                           Patient Vitals for the past 24 hrs:  BP Temp Pulse Resp SpO2  01/17/20 1524 (!) 141/83 -- 60 13 99 %  01/17/20 1512 (!) 140/119 -- 63 14 100 %  01/17/20 1300 (!) 117/104 -- 70 14 99 %  01/17/20 1125 (!) 135/88 98.1 F (36.7 C) 90 18 100 %    4:01 PM Reevaluation with update and discussion. After initial assessment and treatment, an updated evaluation reveals he reports partial relief from Percocet which was given, and has no further complaints.  Findings discussed and questions answered.  He indicates understanding of need to follow-up with oncology and urology, as soon as possible for further care and treatment.  We discussed that he likely has prostate cancer and/or lymphoma.Daleen Bo   Medical Decision Making:  This patient is presenting for evaluation of metastatic cancer, found on MRI imaging of the lumbar spine during evaluation for sciatica-like pain, which does require a range of treatment options, and is a complaint that involves a high risk of morbidity and mortality. The differential diagnoses include primary cancer of various sources, acute renal failure, intractable pain, occult infection. I decided to review old records, and in summary patient with chronic back pain, presenting with weight loss and new findings for metastatic cancer, possibly from prostate source..  I did not require additional historical information from anyone.  Clinical Laboratory Tests Ordered, included CBC, Metabolic panel and LDH, PSA. Review indicates markedly elevated PSA, low hemoglobin, elevated creatinine. Radiologic Tests Ordered, included CT abdomen pelvis, CT chest.  I independently Visualized: Radiographic images, which show pelvic tumor consistent with lipoma.  Left hydronephrosis, and hydroureter to the level  of lymphoma lesion.    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  CRITICAL CARE-yes Performed by: Daleen Bo   Nursing Notes Reviewed/ Care Coordinated Applicable Imaging Reviewed Interpretation of Laboratory Data incorporated into ED treatment  The patient appears reasonably screened and/or stabilized for discharge and I doubt any other medical condition or other [facility] Center requiring further screening, evaluation, or treatment in the ED at this time prior to discharge.  Plan: Home Medications-continue usual; Home Treatments-rest, fluids, stool softener if needed; return here if the recommended treatment, does not improve the symptoms; Recommended follow up-follow-up with medical oncology and urology as soon as possible.  Dr. Wilson Singer to arrange for discharge after social work talk to the patient about transportation needs.     Final Clinical Impression(s) / ED Diagnoses Final diagnoses:  Metastatic malignant neoplasm, unspecified site [facility])  Prostate cancer ([place])  Hydronephrosis with ureteral stricture, not elsewhere classified  Renal insufficiency    Rx / DC Orders ED Discharge Orders         Ordered    oxyCODONE 10 MG TABS  Every 4 hours PRN     Discontinue  Reprint     01/17/20 1557           Daleen Bo, MD 01/17/20 [phone]

## 2020-01-17 NOTE — Discharge Instructions (Addendum)
The test today indicates that you have metastatic cancer, primary lymphoma or prostate cancer, possibly both.  It is important to follow-up with both the oncologist, [name] as well as the urologist, Dr. Everlena Cooper.  Call their offices for an appointment as soon as possible.  We sent a prescription for pain reliever to your pharmacy, to use as needed for pain.  This is strong medicine, so do not drive when you are taking it.  You might need to take a stool softener such as Colace, 100 mg twice a day to help avoid constipation while taking the pain reliever.  Try to eat a high-fiber diet, drink plenty of fluids and eat 3 meals each day.  Return here if needed for problems.

## 2020-01-17 NOTE — ED Notes (Signed)
Beane MD [phone]  MRI results faxed to [facility] from [facility]  Provider at [facility] made aware and given faxed results

## 2020-01-17 NOTE — ED Notes (Addendum)
Provided pt with [place] sandwich with ginger ale.

## 2020-01-18 ENCOUNTER — Telehealth: Payer: Self-pay | Admitting: Hematology and Oncology

## 2020-01-18 NOTE — Telephone Encounter (Signed)
Scheduled appointments per 7/26 provider message. Patient is aware of appointments date and times.

## 2020-01-22 ENCOUNTER — Other Ambulatory Visit: Payer: Self-pay

## 2020-01-22 ENCOUNTER — Inpatient Hospital Stay: Payer: Medicaid Other

## 2020-01-22 ENCOUNTER — Inpatient Hospital Stay: Payer: Medicaid Other | Attending: Hematology and Oncology | Admitting: Hematology and Oncology

## 2020-01-22 ENCOUNTER — Encounter: Payer: Self-pay | Admitting: General Practice

## 2020-01-22 ENCOUNTER — Other Ambulatory Visit: Payer: Medicaid Other

## 2020-01-22 ENCOUNTER — Ambulatory Visit: Payer: Medicaid Other | Admitting: Hematology and Oncology

## 2020-01-22 VITALS — BP 145/88 | HR 83 | Temp 98.2°F | Resp 18 | Ht 74.0 in | Wt 164.4 lb

## 2020-01-22 DIAGNOSIS — F17210 Nicotine dependence, cigarettes, uncomplicated: Secondary | ICD-10-CM

## 2020-01-22 DIAGNOSIS — Z7289 Other problems related to lifestyle: Secondary | ICD-10-CM

## 2020-01-22 DIAGNOSIS — E785 Hyperlipidemia, unspecified: Secondary | ICD-10-CM | POA: Insufficient documentation

## 2020-01-22 DIAGNOSIS — I10 Essential (primary) hypertension: Secondary | ICD-10-CM

## 2020-01-22 DIAGNOSIS — C61 Malignant neoplasm of prostate: Secondary | ICD-10-CM

## 2020-01-22 DIAGNOSIS — Z7189 Other specified counseling: Secondary | ICD-10-CM

## 2020-01-22 DIAGNOSIS — I2510 Atherosclerotic heart disease of native coronary artery without angina pectoris: Secondary | ICD-10-CM

## 2020-01-22 DIAGNOSIS — Z79899 Other long term (current) drug therapy: Secondary | ICD-10-CM | POA: Diagnosis not present

## 2020-01-22 DIAGNOSIS — C7951 Secondary malignant neoplasm of bone: Secondary | ICD-10-CM

## 2020-01-22 MED ORDER — BICALUTAMIDE 50 MG PO TABS
50.0000 mg | ORAL_TABLET | Freq: Every day | ORAL | 0 refills | Status: DC
Start: 2020-01-22 — End: 2020-02-16

## 2020-01-22 MED FILL — BICALUTAMIDE 50 MG TABS: 50 | 30 days supply | Qty: 30 | Fill #0

## 2020-01-22 NOTE — Progress Notes (Signed)
[place] [phone]   [phone]  INITIAL CONSULT NOTE  Patient Care Team: Rosilyn Mings. as PCP - General Lorretta Harp, MD as PCP - Cardiology (Cardiology)  Hematological/Oncological History # Metastatic Prostate Cancer 1) 10/13/2019: CXR in the ED for shortness of breath showed masslike soft tissue fullness within the right hilum concerning for right hilar lymphadenopathy 2) 10/13/2019: CT Chest W contrast showed mediastinal and bilateral hilar adenopathy, right greater than left. Seen by pulmonary with plans for PET and biopsy, though he was lost to follow up.  3) 01/17/2020: sent to the ED by Orthopedic surgery due to metastasis of the spine noted on MRI. IN the ED CT C/A/P showed extensive left pelvic and retroperitoneal lymphadenopathy. Mediastinal and bilateral hilar lymphadenopathy as well as mixed lytic and sclerotic lesions are noted in the bony pelvis 4) 01/17/2020: PSA noted to be 1074.94 5) 01/22/2020: establish care with [name]   CHIEF COMPLAINTS/PURPOSE OF CONSULTATION:  "Metastatic Prostate Cancer "  HISTORY OF PRESENTING ILLNESS:  [name] [name] 64 y.o. male with medical history significant for tentorial meningioma (diagnosed 01/15/2017), HTN, CAD, HLD, and active smoking who presents for evaluation of newly diagnosed prostate cancer.   On review of the previous records the [name] [name] was initially noted to have an abnormality on the chest x-ray on 10/13/2019 performed in the emergency department due to shortness of breath.  He had been connected with pulmonology, but unfortunate never followed up with a PET CT scan or biopsy.  More recently on 01/17/2020 the patient was sent to the emergency department by orthopedic surgery due to the finding of metastatic lesions in the spine.  In the emergency department the patient had a CT chest abdomen pelvis which showed extensive left pelvic and retroperitoneal lymphadenopathy as well as  mediastinal and bilateral hilar lymphadenopathy.  His PSA at that time was noted to be 1074.  Due to concern for this finding the patient was referred to Oncology for further evaluation and management.  On exam today her [name] [name] is currently accompanied by his daughter.  [name] [name] notes that he has chronic back pain due to a herniated disc problem.  He reports that his pain can be as severe as 10 out of 10 in severity, however when taking medications it is declined down to 5 out of 10 in severity.  He also reports that since this diagnosis he has had weight loss and has not been eating much.  He also notes that his energy is lower than it typically is at baseline.  On further discussion he notes that he is not having any issues with shortness of breath, chest pain, urinary symptoms, nausea, vomiting or diarrhea.  MEDICAL HISTORY:  Past Medical History:  Diagnosis Date  . Brain tumor [facility])    diagnosed by MRI 6 months ago  . Cancer ([place])    Pt reports brain tumor 2 years ago  . Coronary artery disease 2011   s/p BMS stent LAD, [facility], [place], [place], Dr Chauncey Cruel. [name]  . Depression   . Dyspnea   . High cholesterol   . History of herniated intervertebral disc   . HTN (hypertension)   . Hyperlipidemia LDL goal <70 07/21/2013  . Lung nodule   . NSTEMI (non-ST elevated myocardial infarction) ([place]) 11/11/2017  . Smoker    1/2 ppd x >40 years     SURGICAL HISTORY: Past Surgical History:  Procedure Laterality Date  . CORONARY ANGIOPLASTY WITH STENT PLACEMENT Left  2011   BMS LAD, [facility], [place], [place], Dr Chauncey Cruel. [name]  . CORONARY STENT INTERVENTION N/A 11/12/2017   Procedure: CORONARY STENT INTERVENTION;  Surgeon: Leonie Man, MD;  Location: [place] [facility];  Service: Cardiovascular;  Laterality: N/A;  . LEFT HEART CATH AND CORONARY ANGIOGRAPHY N/A 11/12/2017   Procedure: LEFT HEART CATH AND CORONARY ANGIOGRAPHY;  Surgeon: Leonie Man, MD;  Location: [place] [facility];  Service: Cardiovascular;  Laterality: N/A;    SOCIAL HISTORY: Social History   Socioeconomic History  . Marital status: Single    Spouse name: Not on file  . Number of children: Not on file  . Years of education: Not on file  . Highest education level: Not on file  Occupational History  . Occupation: unemployed  Tobacco Use  . Smoking status: Current Every Day Smoker    Packs/day: 0.25    Years: 45.00    Pack years: 11.25  . Smokeless tobacco: Never Used  Vaping Use  . Vaping Use: Never used  Substance and Sexual Activity  . Alcohol use: Yes    Alcohol/week: 75.0 standard drinks    Types: 75 Shots of liquor per week    Comment: 2 shots of liquor daily  . Drug use: Not Currently    Types: Marijuana    Comment: occasionally  . Sexual activity: Yes  Other Topics Concern  . Not on file  Social History Narrative   From [place]    Family lives in [place] (brother, sister and mother)    Unemployed    Drinks 3 (1/5)ths in 1 week   Social Determinants of Health   Financial Resource Strain: Low Risk   . Difficulty of Paying Living Expenses: Not very hard  Food Insecurity: No Food Insecurity  . Worried About Charity fundraiser in the Last Year: Never true  . Ran Out of Food in the Last Year: Never true  Transportation Needs: No Transportation Needs  . Lack of Transportation (Medical): No  . Lack of Transportation (Non-Medical): No  Physical Activity: Inactive  . Days of Exercise per Week: 0 days  . Minutes of Exercise per Session: 0 min  Stress: Stress Concern Present  . Feeling of Stress : To some extent  Social Connections: Socially Isolated  . Frequency of Communication with Friends and Family: Three times a week  . Frequency of Social Gatherings with Friends and Family: Three times a week  . Attends Religious Services: Never  . Active Member of Clubs or Organizations: No  . Attends Archivist Meetings: Never  . Marital Status:  Never married  Intimate Partner Violence:   . Fear of Current or Ex-Partner:   . Emotionally Abused:   Marland Kitchen Physically Abused:   . Sexually Abused:     FAMILY HISTORY: Family History  Problem Relation Age of Onset  . Diabetes Paternal Grandfather   . Hypertension Paternal Grandfather     ALLERGIES:  has No Known Allergies.  MEDICATIONS:  Current Outpatient Medications  Medication Sig Dispense Refill  . aspirin 81 MG EC tablet Take 1 tablet (81 mg total) by mouth daily. 90 tablet 3  . atorvastatin (LIPITOR) 80 MG tablet Take 1 tablet (80 mg total) by mouth daily at 6 PM. 90 tablet 3  . bicalutamide (CASODEX) 50 MG tablet Take 1 tablet (50 mg total) by mouth daily. 30 tablet 0  . carvedilol (COREG) 6.25 MG tablet Take 1 tablet (6.25 mg total)  by mouth 2 (two) times daily with a meal. (Patient not taking: Reported on 01/22/2020) 180 tablet 3  . clopidogrel (PLAVIX) 75 MG tablet Take 1 tablet (75 mg total) by mouth daily. 90 tablet 3  . diclofenac (VOLTAREN) 50 MG EC tablet Take 1 tablet (50 mg total) by mouth 2 (two) times daily. (Patient not taking: Reported on 12/23/2019) 15 tablet 0  . gabapentin (NEURONTIN) 300 MG capsule Take 300 mg by mouth 3 (three) times daily as needed for pain.    Marland Kitchen lisinopril (ZESTRIL) 10 MG tablet Take 1 tablet (10 mg total) by mouth daily. (Patient not taking: Reported on 01/22/2020) 90 tablet 3  . Multiple Vitamin (MULTIVITAMIN WITH MINERALS) TABS tablet Take 1 tablet by mouth daily. (Patient not taking: Reported on 01/15/2017) 30 tablet 11  . nitroGLYCERIN (NITROSTAT) 0.4 MG SL tablet Place 1 tablet (0.4 mg total) under the tongue every 5 (five) minutes as needed for chest pain. (Patient not taking: Reported on 12/23/2019) 25 tablet 1  . oxyCODONE 10 MG TABS Take 0.5 tablets (5 mg total) by mouth every 4 (four) hours as needed for severe pain. 30 tablet 0   No current facility-administered medications for this visit.    REVIEW OF SYSTEMS:   Constitutional: ( -  ) fevers, ( - )  chills , ( - ) night sweats Eyes: ( - ) blurriness of vision, ( - ) double vision, ( - ) watery eyes Ears, nose, mouth, throat, and face: ( - ) mucositis, ( - ) sore throat Respiratory: ( - ) cough, ( - ) dyspnea, ( - ) wheezes Cardiovascular: ( - ) palpitation, ( - ) chest discomfort, ( - ) lower extremity swelling Gastrointestinal:  ( - ) nausea, ( - ) heartburn, ( - ) change in bowel habits Skin: ( - ) abnormal skin rashes Lymphatics: ( - ) new lymphadenopathy, ( - ) easy bruising Neurological: ( - ) numbness, ( - ) tingling, ( - ) new weaknesses Behavioral/Psych: ( - ) mood change, ( - ) new changes  All other systems were reviewed with the patient and are negative.  PHYSICAL EXAMINATION: ECOG PERFORMANCE STATUS: 1 - Symptomatic but completely ambulatory  Vitals:   01/22/20 1114  BP: (!) 145/88  Pulse: 83  Resp: 18  Temp: 98.2 F (36.8 C)  SpO2: 100%   Filed Weights   01/22/20 1114  Weight: 164 lb 6.4 oz (74.6 kg)    GENERAL: well appearing middle aged [place] American male in NAD  SKIN: skin color, texture, turgor are normal, no rashes or significant lesions EYES: conjunctiva are pink and non-injected, sclera clear LYMPH:  no palpable lymphadenopathy in the cervical, axillary or supraclavicular LUNGS: clear to auscultation and percussion with normal breathing effort HEART: regular rate & rhythm and no murmurs and no lower extremity edema ABDOMEN: soft, non-tender, non-distended, normal bowel sounds. No HSM appreciated.  Musculoskeletal: no cyanosis of digits and no clubbing  PSYCH: alert & oriented x 3, fluent speech NEURO: no focal motor/sensory deficits  LABORATORY DATA:  I have reviewed the data as listed CBC Latest Ref Rng & Units 01/17/2020 12/11/2019 10/13/2019  WBC 4.0 - 10.5 K/uL 6.4 6.3 7.7  Hemoglobin 13.0 - 17.0 g/dL 10.5(L) 11.0(L) 11.8(L)  Hematocrit 39 - 52 % 34.6(L) 36.6(L) 37.7(L)  Platelets 150 - 400 K/uL 259 240 211    CMP Latest  Ref Rng & Units 01/17/2020 12/23/2019 12/11/2019  Glucose 70 - 99 mg/dL 103(H) - 103(H)  BUN 8 -  23 mg/dL 21 - 16  Creatinine 0.61 - 1.24 mg/dL 1.56(H) - 1.60(H)  Sodium 135 - 145 mmol/L 138 - 138  Potassium 3.5 - 5.1 mmol/L 4.0 - 4.4  Chloride 98 - 111 mmol/L 103 - 104  CO2 22 - 32 mmol/L 24 - 24  Calcium 8.9 - 10.3 mg/dL 9.2 - 9.5  Total Protein 6.5 - 8.1 g/dL 8.3(H) 8.5 7.8  Total Bilirubin 0.3 - 1.2 mg/dL 0.5 <0.2 0.7  Alkaline Phos 38 - 126 U/L 116 158(H) 148(H)  AST 15 - 41 U/L 14(L) 14 20  ALT 0 - 44 U/L 17 10 14     RADIOGRAPHIC STUDIES: I have personally reviewed the radiological images as listed and agreed with the findings in the report: marked lymphadenopathy in the chest and abdomen.  CT Chest W Contrast  Result Date: 01/17/2020 CLINICAL DATA:  64 year old male with history of unknown primary malignancy. EXAM: CT CHEST, ABDOMEN, AND PELVIS WITH CONTRAST TECHNIQUE: Multidetector CT imaging of the chest, abdomen and pelvis was performed following the standard protocol during bolus administration of intravenous contrast. CONTRAST:  177mL OMNIPAQUE IOHEXOL 300 MG/ML  SOLN COMPARISON:  Chest CT 10/13/2019. No prior CT the abdomen and pelvis. FINDINGS: CT CHEST FINDINGS Cardiovascular: Heart size is normal. There is no significant pericardial fluid, thickening or pericardial calcification. There is aortic atherosclerosis, as well as atherosclerosis of the great vessels of the mediastinum and the coronary arteries, including calcified atherosclerotic plaque in the left anterior descending, left circumflex and right coronary arteries. Stents are present in the left anterior descending and left circumflex coronary arteries. Mediastinum/Nodes: Multiple enlarged mediastinal and bilateral hilar lymph nodes measuring up to 2.3 cm in short axis in the subcarinal nodal station (previously 2.1 cm), 2.5 cm in short axis in the right hilar nodal station (previously 2.8 cm), 1.6 cm in short axis in the  left hilar nodal station (previously 1.4 cm), and 1.7 cm in short axis in the low right paratracheal nodal station (previously 1.2 cm). Esophagus is unremarkable in appearance. No axillary lymphadenopathy. Lungs/Pleura: Multiple small pulmonary nodules are again noted throughout the lungs bilaterally, generally similar in size, number and distribution to the prior examination, largest of which is in the lateral aspect of the right lower lobe (axial image 93 of series 7) measuring 10 x 8 mm (mean diameter of 9 mm). Mild chronic scarring in the right middle lobe. No acute consolidative airspace disease. No pleural effusions. Diffuse bronchial wall thickening with mild centrilobular and paraseptal emphysema. Musculoskeletal: There are no aggressive appearing lytic or blastic lesions noted in the visualized portions of the skeleton. CT ABDOMEN PELVIS FINDINGS Hepatobiliary: Several low-attenuation lesions are again noted scattered throughout the hepatic parenchyma, largest of which measures only 1.1 cm in diameter, compatible with simple cysts. Other subcentimeter low-attenuation lesions in the liver, too small to characterize, but favored to represent tiny cysts. No definite suspicious hepatic lesions. No intra or extrahepatic biliary ductal dilatation. Gallbladder is normal in appearance. Pancreas: No pancreatic mass. No pancreatic ductal dilatation. No pancreatic or peripancreatic fluid collections or inflammatory changes. Spleen: Unremarkable. Adrenals/Urinary Tract: Moderate left hydroureteronephrosis. There is abrupt cut off of the left ureter at the junction of middle and distal thirds, presumably related to bulky left retroperitoneal lymphadenopathy. Right kidney and bilateral adrenal glands are normal in appearance. No right hydroureteronephrosis. Urinary bladder is unremarkable in appearance. Stomach/Bowel: The appearance of the stomach is normal. There is no pathologic dilatation of small bowel or colon.  Normal appendix. Vascular/Lymphatic: Aortic  atherosclerosis, without evidence of aneurysm or dissection in the abdominal or pelvic vasculature. Ulcerated plaque in the infrarenal abdominal aorta (axial image 88 of series 3). Extensive lymphadenopathy noted in the retroperitoneum and along the left pelvic sidewall. This is well demonstrated on axial image 77 of series 3 where a left para-aortic lymph node measures up to 2.9 cm in short axis. The largest pelvic nodal mass measures 4.4 x 6.4 cm in the left external iliac nodal chain (axial image 113 of series 3). Reproductive: Prostate gland is enlarged measuring 5.7 x 5.7 x 6.3 cm. Seminal vesicles are unremarkable in appearance. Right-sided hydrocele incompletely imaged. Other: No significant volume of ascites.  No pneumoperitoneum. Musculoskeletal: Multiple mixed lytic and sclerotic lesions are noted in the bony pelvis, presumably reflective of multifocal metastatic disease. The largest of these is in the right-side of the sacrum measuring up to 7.4 x 5.1 cm (axial image 102 of series 3). IMPRESSION: 1. Extensive left pelvic and retroperitoneal lymphadenopathy concerning for lymphoma. However, the possibility of a primary testicular malignancy is not excluded. Correlation with testicular ultrasound should be considered if clinically appropriate. 2. Mediastinal and bilateral hilar lymphadenopathy redemonstrated, with minimal change compared to the prior examination, as above. 3. Previously noted small pulmonary nodules appear centrally stable compared to the prior examination. 4. Small right-sided hydrocele. 5. Aortic atherosclerosis, in addition to 3 vessel coronary artery disease. Please note that although the presence of coronary artery calcium documents the presence of coronary artery disease, the severity of this disease and any potential stenosis cannot be assessed on this non-gated CT examination. Assessment for potential risk factor modification, dietary  therapy or pharmacologic therapy may be warranted, if clinically indicated. 6. Mild diffuse bronchial wall thickening with mild centrilobular and paraseptal emphysema; imaging findings suggestive of underlying COPD. 7. Additional incidental findings, as above. Electronically Signed   By: Vinnie Langton M.D.   On: 01/17/2020 15:12   CT Abdomen Pelvis W Contrast  Result Date: 01/17/2020 CLINICAL DATA:  64 year old male with history of unknown primary malignancy. EXAM: CT CHEST, ABDOMEN, AND PELVIS WITH CONTRAST TECHNIQUE: Multidetector CT imaging of the chest, abdomen and pelvis was performed following the standard protocol during bolus administration of intravenous contrast. CONTRAST:  180mL OMNIPAQUE IOHEXOL 300 MG/ML  SOLN COMPARISON:  Chest CT 10/13/2019. No prior CT the abdomen and pelvis. FINDINGS: CT CHEST FINDINGS Cardiovascular: Heart size is normal. There is no significant pericardial fluid, thickening or pericardial calcification. There is aortic atherosclerosis, as well as atherosclerosis of the great vessels of the mediastinum and the coronary arteries, including calcified atherosclerotic plaque in the left anterior descending, left circumflex and right coronary arteries. Stents are present in the left anterior descending and left circumflex coronary arteries. Mediastinum/Nodes: Multiple enlarged mediastinal and bilateral hilar lymph nodes measuring up to 2.3 cm in short axis in the subcarinal nodal station (previously 2.1 cm), 2.5 cm in short axis in the right hilar nodal station (previously 2.8 cm), 1.6 cm in short axis in the left hilar nodal station (previously 1.4 cm), and 1.7 cm in short axis in the low right paratracheal nodal station (previously 1.2 cm). Esophagus is unremarkable in appearance. No axillary lymphadenopathy. Lungs/Pleura: Multiple small pulmonary nodules are again noted throughout the lungs bilaterally, generally similar in size, number and distribution to the prior  examination, largest of which is in the lateral aspect of the right lower lobe (axial image 93 of series 7) measuring 10 x 8 mm (mean diameter of 9 mm). Mild chronic scarring  in the right middle lobe. No acute consolidative airspace disease. No pleural effusions. Diffuse bronchial wall thickening with mild centrilobular and paraseptal emphysema. Musculoskeletal: There are no aggressive appearing lytic or blastic lesions noted in the visualized portions of the skeleton. CT ABDOMEN PELVIS FINDINGS Hepatobiliary: Several low-attenuation lesions are again noted scattered throughout the hepatic parenchyma, largest of which measures only 1.1 cm in diameter, compatible with simple cysts. Other subcentimeter low-attenuation lesions in the liver, too small to characterize, but favored to represent tiny cysts. No definite suspicious hepatic lesions. No intra or extrahepatic biliary ductal dilatation. Gallbladder is normal in appearance. Pancreas: No pancreatic mass. No pancreatic ductal dilatation. No pancreatic or peripancreatic fluid collections or inflammatory changes. Spleen: Unremarkable. Adrenals/Urinary Tract: Moderate left hydroureteronephrosis. There is abrupt cut off of the left ureter at the junction of middle and distal thirds, presumably related to bulky left retroperitoneal lymphadenopathy. Right kidney and bilateral adrenal glands are normal in appearance. No right hydroureteronephrosis. Urinary bladder is unremarkable in appearance. Stomach/Bowel: The appearance of the stomach is normal. There is no pathologic dilatation of small bowel or colon. Normal appendix. Vascular/Lymphatic: Aortic atherosclerosis, without evidence of aneurysm or dissection in the abdominal or pelvic vasculature. Ulcerated plaque in the infrarenal abdominal aorta (axial image 88 of series 3). Extensive lymphadenopathy noted in the retroperitoneum and along the left pelvic sidewall. This is well demonstrated on axial image 77 of series 3  where a left para-aortic lymph node measures up to 2.9 cm in short axis. The largest pelvic nodal mass measures 4.4 x 6.4 cm in the left external iliac nodal chain (axial image 113 of series 3). Reproductive: Prostate gland is enlarged measuring 5.7 x 5.7 x 6.3 cm. Seminal vesicles are unremarkable in appearance. Right-sided hydrocele incompletely imaged. Other: No significant volume of ascites.  No pneumoperitoneum. Musculoskeletal: Multiple mixed lytic and sclerotic lesions are noted in the bony pelvis, presumably reflective of multifocal metastatic disease. The largest of these is in the right-side of the sacrum measuring up to 7.4 x 5.1 cm (axial image 102 of series 3). IMPRESSION: 1. Extensive left pelvic and retroperitoneal lymphadenopathy concerning for lymphoma. However, the possibility of a primary testicular malignancy is not excluded. Correlation with testicular ultrasound should be considered if clinically appropriate. 2. Mediastinal and bilateral hilar lymphadenopathy redemonstrated, with minimal change compared to the prior examination, as above. 3. Previously noted small pulmonary nodules appear centrally stable compared to the prior examination. 4. Small right-sided hydrocele. 5. Aortic atherosclerosis, in addition to 3 vessel coronary artery disease. Please note that although the presence of coronary artery calcium documents the presence of coronary artery disease, the severity of this disease and any potential stenosis cannot be assessed on this non-gated CT examination. Assessment for potential risk factor modification, dietary therapy or pharmacologic therapy may be warranted, if clinically indicated. 6. Mild diffuse bronchial wall thickening with mild centrilobular and paraseptal emphysema; imaging findings suggestive of underlying COPD. 7. Additional incidental findings, as above. Electronically Signed   By: Vinnie Langton M.D.   On: 01/17/2020 15:12    ASSESSMENT & PLAN Dailon Sheeran  64 y.o. male with medical history significant for tentorial meningioma (diagnosed 01/15/2017), HTN, CAD, HLD, and active smoking who presents for evaluation of newly diagnosed prostate cancer.  After review of the records, review the imaging, and review the labs the patient's findings are most consistent with a newly diagnosed metastatic prostate cancer.  Although the spread pattern appears to be to the lymph nodes and bones, there is little  doubt the patient's primary cancer is prostate given the PSA of greater than 1000.  Given this we have started him on bicalutamide therapy with intentions of administering Lupron shot in approximately 2 to 4 weeks.  Additionally once the biopsy has confirmed the diagnosis we will start the patient on abiraterone therapy for additional treatment.  At this time the patient's major symptom is back pain for which she is currently taking opioid pain medication.  Today I was clear with the patient that metastatic prostate cancer is not a curable disease.  All of our treatments moving forward will be designed to slow the progression of the cancer, shrink the existing cancer, and extend life span.  There is no cure for this disease and I was very straightforward with the patient that there is nothing we can do in order to completely remove the cancer from his body.  The patient voiced his understanding of these findings and was agreeable to proceed with treatment.   # Metastatic Prostate Cancer --will complete workup with a NM bone scan to assess the extent of his lytic lesions --will need a prostate biopsy in order to confirm the diagnosis. Referral placed to urology today for consideration of biopsy --will start bicalutamide 50mg  PO daily today with plans to start lupron therapy in 2-4 weeks.  --Additionally will plan to start patient on PO abiraterone therapy 1000mg  PO daily with 5mg  PO prednisone (after the start of lupron therapy and confirmed diagnosis with prostate  biopsy).  --have patient RTC in 4 weeks after starting lupron therapy to discuss d/c of bicalutamide and start of abiraterone.  #Symptom management -- oxycodone 5mg  q4H PRN for pain --encourage OTC senna for constipation prevention   #Goals of Care --discussed the incurable nature of metastatic disease --emphasized that all treatments moving forward are focused on symptom control, decreasing tumor size/slow spread, and increasing lifespan.  --patient voiced understanding of the palliative nature of treatments moving forward.  --patients MPOA is Chanel Sarina Ill (his daughter)    No orders of the defined types were placed in this encounter.   All questions were answered. The patient knows to call the clinic with any problems, questions or concerns.  A total of more than 60 minutes were spent on this encounter and over half of that time was spent on counseling and coordination of care as outlined above.   [name] Peoples, MD Department of Hematology/Oncology [place] at [facility] Phone: [phone] Pager: [phone] Email: [email or web address]  01/24/2020 3:53 PM

## 2020-01-22 NOTE — Progress Notes (Addendum)
[place] CSW Progress Notes  Message from Edwyna Shell, RN re patient w multiple psychosocial needs due to see Dr Lorenso Courier today.  CSW contacted his Medicaid plan provider to determine any additional benefits available to patient.  He is eligible for a cell phone (safelinks.com or [phone]), transportation to/from medical appointments [facility] [phone]) and expedited access to needed services (Rapid Response Team [phone] - they may be able to assign him to a complex case manager).  CSW Wallis Bamberg or Candis Schatz will meet w patient when he is at [facility] today to assess/determine needs.    Barriers to care:  - Transportation:  Do you anticipate any problems getting to appointments?  Do you have someone who can help run errands for you if you need it?  Typically takes the bus to/from appointments.  Daughter (from [place]) bringing him to today's appointment.  Advised about both Solicitor and his Medicaid transport benefit.  He plans to take advantage of these options - Help at home:  What is your living situation (alone, family, other)?  If you are physically unable to care for yourself, who would you call on to help you?  Lives alone at [facility], a senior housing complex in [place].  There is a Education officer, museum on site, he is not currently engaged w her but could be if needed.  - Support system:  What does your support system look like?  Who would you call on if you needed some kind of practical help?  What if you needed someone to talk to for emotional support?  Reports good support from his daughter and states he is very close to and in frequent contact w his family.  He does have a working cell phone  He does not currently have a PCP, but has an appointment at [facility] for hospital follow up - he was encouraged to keep this appointment.   - Finances:  Are you concerned about finances.  Considering returning to work?  If not, applying for disability?  Has been on disability  for the last few years, has income based housing which includes utilities.  Is able to meet all his bills and afford food and other essentials.  Has Medicaid.  No significant financial concerns at this time.      Edwyna Shell, LCSW Clinical Social Worker Phone:  [phone] Cell:  [phone]

## 2020-01-22 NOTE — Progress Notes (Signed)
[place] CSW Progress Notes  Referred to [employer].  Also spoke w daughter Lyndel Safe - gave her both [place] transportation and his Medicaid transportation numbers [phone]).    Patient can use [employer] for [employer], will need to enroll/schedule rides through his Medicaid transport provider for all other needs.  Daughter expressed concern about access to food - he will be referred to [employer] for Meals on Wheels via [place].  Daughter encouraged to call as needed for help/resources.  Edwyna Shell, LCSW Clinical Social Worker Phone:  [phone] Cell:   [phone]

## 2020-01-24 ENCOUNTER — Encounter: Payer: Self-pay | Admitting: Hematology and Oncology

## 2020-01-25 ENCOUNTER — Telehealth: Payer: Self-pay | Admitting: Cardiology

## 2020-01-25 NOTE — Telephone Encounter (Signed)
   [name] [name] DOB: 01/22/1956 MRN: [id number]   RIDER WAIVER AND RELEASE OF LIABILITY  For purposes of improving physical access to our facilities, [facility] is pleased to partner with third parties to provide [place] patients or other authorized individuals the option of convenient, on-demand ground transportation services (the [place]") through use of the technology service that enables users to request on-demand ground transportation from independent third-party providers.  By opting to use and accept these [employer], I, the undersigned, hereby agree on behalf of myself, and on behalf of any minor child using the [employer] for whom I am the parent or legal guardian, as follows:  1. Government social research officer provided to me are provided by independent third-party transportation providers who are not [employer] or employees and who are unaffiliated with [employer]. 2. [facility] is neither a transportation carrier nor a common or public carrier. 3. [facility] has no control over the quality or safety of the transportation that occurs as a result of the [employer]. 4. [facility] cannot guarantee that any third-party transportation provider will complete any arranged transportation service. 5. [facility] makes no representation, warranty, or guarantee regarding the reliability, timeliness, quality, safety, suitability, or availability of any of the Transport Services or that they will be error free. 6. I fully understand that traveling by vehicle involves risks and dangers of serious bodily injury, including permanent disability, paralysis, and death. I agree, on behalf of myself and on behalf of any minor child using the Transport Services for whom I am the parent or legal guardian, that the entire risk arising out of my use of the [employer] remains solely with me, to the maximum extent permitted under applicable law. 7. The [employer] are provided "as is" and "as available." [facility] disclaims all representations and warranties, express, implied or statutory, not expressly set out in these terms, including the implied warranties of merchantability and fitness for a particular purpose. 8. I hereby waive and release [facility], its agents, employees, officers, directors, representatives, insurers, attorneys, assigns, successors, subsidiaries, and affiliates from any and all past, present, or future claims, demands, liabilities, actions, causes of action, or suits of any kind directly or indirectly arising from acceptance and use of the [employer]. 9. I further waive and release [facility] and its affiliates from all present and future liability and responsibility for any injury or death to persons or damages to property caused by or related to the use of the [employer]. 10. I have read this Waiver and Release of Liability, and I understand the terms used in it and their legal significance. This Waiver is freely and voluntarily given with the understanding that my right (as well as the right of any minor child for whom I am the parent or legal guardian using the [employer]) to legal recourse against [facility] in connection with the [employer] is knowingly surrendered in return for use of these services.   I attest that I read the consent document to [name] [name], gave [name] [name] the opportunity to ask questions and answered the questions asked (if any). I affirm that [name] [name] then provided consent for he's participation in this program.     [name] [name]

## 2020-01-26 ENCOUNTER — Telehealth: Payer: Self-pay | Admitting: Hematology and Oncology

## 2020-01-26 NOTE — Telephone Encounter (Signed)
Scheduled per los. Called and left msg. Mailed printout  °

## 2020-01-28 ENCOUNTER — Inpatient Hospital Stay (INDEPENDENT_AMBULATORY_CARE_PROVIDER_SITE_OTHER): Payer: Medicaid Other | Admitting: Primary Care

## 2020-01-28 ENCOUNTER — Other Ambulatory Visit: Payer: Self-pay

## 2020-01-29 ENCOUNTER — Other Ambulatory Visit: Payer: Self-pay | Admitting: Hematology and Oncology

## 2020-01-29 DIAGNOSIS — C61 Malignant neoplasm of prostate: Secondary | ICD-10-CM | POA: Insufficient documentation

## 2020-02-01 ENCOUNTER — Ambulatory Visit (HOSPITAL_COMMUNITY): Payer: Medicaid Other | Attending: Cardiology

## 2020-02-01 ENCOUNTER — Other Ambulatory Visit: Payer: Self-pay

## 2020-02-01 DIAGNOSIS — R0602 Shortness of breath: Secondary | ICD-10-CM | POA: Insufficient documentation

## 2020-02-01 LAB — ECHOCARDIOGRAM COMPLETE
Area-P 1/2: 3.87 cm2
MV M vel: 5.47 m/s
MV Peak grad: 119.7 mmHg
Radius: 0.3 cm
S' Lateral: 3.3 cm

## 2020-02-02 ENCOUNTER — Encounter (HOSPITAL_COMMUNITY): Payer: Self-pay | Admitting: General Practice

## 2020-02-02 NOTE — Progress Notes (Signed)
[facility] CSW Progress Notes  Patient added to the community nutrition wait list per [facility].  Edwyna Shell, LCSW Clinical Social Worker Phone:  [phone]

## 2020-02-03 ENCOUNTER — Telehealth: Payer: Self-pay

## 2020-02-03 NOTE — Telephone Encounter (Signed)
Nutrition Assessment   Reason for Assessment: Patient identified on Malnutrition Screening report for weight loss and poor appetite   ASSESSMENT:  64 year old male with metastatic prostate cancer.  Past medical history of tentorial meningioma,  HTN, HLD, CAD, smoker.  Patient followed by [name].  Patient to begin bicalutamide orally and lupron in 2-4 weeks.   Spoke with patient via phone to introduce self and service at [facility].  Patient reports that he has been eating more recently after concerns over weight loss.  Reports fatigue has effected intake and has not been eating as much.  Reports that he has really be trying to make and effort to eat more.  Reports eating oatmeal, eggs and bacon for breakfast.  Chicken and macroni and cheese last night for dinner.  Reports that he has been trying to eat more frozen meals because they are easier for him to prepare.     Medications:  reviewed   Labs: reviewed   Anthropometrics:   Height: 64 inches Weight: 164 lb 180 lb April 2021 BMI: 21  9% weight loss in the last 3 months, significant    NUTRITION DIAGNOSIS: Inadequate oral intake related to fatigue, cancer as evidenced by 9% weight loss in the last 3 months   INTERVENTION:  Encouraged good sources of protein at every meal. Encouraged small frequent meals Discussed easy to prepare meals and foods to keep on hand.  Offered contact information but patient says that he is outside and unable to write down RD number.   MONITORING, EVALUATION, GOAL: weight trends, intake   Next Visit: Sept 8th phone f/u  Kelcey Korus B. Zenia Resides, [place], [place] Registered Dietitian [phone] (mobile)

## 2020-02-05 ENCOUNTER — Other Ambulatory Visit: Payer: Self-pay

## 2020-02-05 ENCOUNTER — Inpatient Hospital Stay: Payer: Medicaid Other | Attending: Hematology and Oncology

## 2020-02-05 VITALS — BP 136/79 | HR 85 | Temp 98.9°F | Resp 18

## 2020-02-05 DIAGNOSIS — Z5111 Encounter for antineoplastic chemotherapy: Secondary | ICD-10-CM | POA: Insufficient documentation

## 2020-02-05 DIAGNOSIS — Z79899 Other long term (current) drug therapy: Secondary | ICD-10-CM | POA: Insufficient documentation

## 2020-02-05 DIAGNOSIS — C61 Malignant neoplasm of prostate: Secondary | ICD-10-CM | POA: Diagnosis present

## 2020-02-05 MED ORDER — LEUPROLIDE ACETATE (3 MONTH) 22.5 MG IM KIT: 22.5000 mg | PACK | Freq: Once | INTRAMUSCULAR | Status: DC

## 2020-02-05 MED ORDER — LEUPROLIDE ACETATE (3 MONTH) 22.5 MG ~~LOC~~ KIT
PACK | SUBCUTANEOUS | Status: AC
  Filled 2020-02-05: qty 22.5

## 2020-02-05 MED ORDER — LEUPROLIDE ACETATE (3 MONTH) 22.5 MG ~~LOC~~ KIT
22.5000 mg | PACK | Freq: Once | SUBCUTANEOUS | Status: AC
  Administered 2020-02-05: 22.5 mg via SUBCUTANEOUS

## 2020-02-05 NOTE — Patient Instructions (Signed)

## 2020-02-16 ENCOUNTER — Ambulatory Visit (INDEPENDENT_AMBULATORY_CARE_PROVIDER_SITE_OTHER): Payer: Medicaid Other | Admitting: Cardiovascular Disease

## 2020-02-16 ENCOUNTER — Encounter: Payer: Self-pay | Admitting: Cardiovascular Disease

## 2020-02-16 ENCOUNTER — Other Ambulatory Visit: Payer: Self-pay

## 2020-02-16 DIAGNOSIS — E785 Hyperlipidemia, unspecified: Secondary | ICD-10-CM | POA: Diagnosis not present

## 2020-02-16 DIAGNOSIS — R0602 Shortness of breath: Secondary | ICD-10-CM

## 2020-02-16 DIAGNOSIS — I10 Essential (primary) hypertension: Secondary | ICD-10-CM | POA: Diagnosis not present

## 2020-02-16 DIAGNOSIS — I2510 Atherosclerotic heart disease of native coronary artery without angina pectoris: Secondary | ICD-10-CM

## 2020-02-16 DIAGNOSIS — F17200 Nicotine dependence, unspecified, uncomplicated: Secondary | ICD-10-CM

## 2020-02-16 DIAGNOSIS — Z9861 Coronary angioplasty status: Secondary | ICD-10-CM

## 2020-02-16 NOTE — Assessment & Plan Note (Signed)
History of essential hypertension blood pressure measured today 156/84.  He is on lisinopril.

## 2020-02-16 NOTE — Assessment & Plan Note (Signed)
Continue tobacco use of 3 cigarettes a day.

## 2020-02-16 NOTE — Assessment & Plan Note (Signed)
History of CAD status post LAD stenting at [facility] 04/05/2010.  He had intervention by Dr. Ellyn Hack in the setting of non-STEMI 11/19/2017.  He demonstrated a patent LAD stent and perform stenting of his proximal AV groove circumflex into his first obtuse marginal branch.  He did have a high-grade distal circumflex stenosis which was untreated.  He currently denies chest pain.

## 2020-02-16 NOTE — Progress Notes (Signed)
02/16/2020 [name] [name]   09/02/1955  [id number]  Primary Physician [facility] Primary Cardiologist: Lorretta Harp MD Lupe Carney, [place]  HPI:  [name] [name] is a 64 y.o.  thin appearing single African-American male father of 2 children, grandfather 3 grandchildren who currently does not work.  I last saw him in the office 12/23/2019. His risk factors include 45-pack-year tobacco abuse currently smoking 4 to 5 cigarettes a day down from a pack a day 6 months ago.  History of hypertension hyperlipidemia.  He had a stent implanted in his LAD 04/05/2010 at [facility] ([facility]).  He was admitted to [facility] 11/11/2017 with chest pain and non-STEMI and underwent cardiac catheterization by Dr. Ellyn Hack the following day revealing patent LAD stent with high-grade tandem lesions in the circumflex obtuse marginal branch which were stented with overlapping synergy drug-eluting stents.  The distal lesion in the AV groove was left untreated.  He had normal LV function.  Her last several months he is noted increasing dyspnea on exertion but denies chest pain.  Since I saw him almost 2 months ago a 2D echo was performed that was essentially normal.  We were going to get a Myoview stress test which unfortunately was never able to perform but in the interim, his shortness of breath has spontaneously resolved.  We did check a lipid profile which revealed a LDL of 175 but he now tells me that he is not been taking his atorvastatin as directed.   Current Meds  Medication Sig  . aspirin 81 MG EC tablet Take 1 tablet (81 mg total) by mouth daily.  . clopidogrel (PLAVIX) 75 MG tablet Take 1 tablet (75 mg total) by mouth daily.  . diclofenac (VOLTAREN) 50 MG EC tablet Take 1 tablet (50 mg total) by mouth 2 (two) times daily.  Marland Kitchen gabapentin (NEURONTIN) 300 MG capsule Take 300 mg by mouth 3 (three) times daily as needed for pain.  Marland Kitchen lisinopril (ZESTRIL) 10 MG tablet  Take 1 tablet (10 mg total) by mouth daily.  . nitroGLYCERIN (NITROSTAT) 0.4 MG SL tablet Place 1 tablet (0.4 mg total) under the tongue every 5 (five) minutes as needed for chest pain.  Marland Kitchen oxyCODONE 10 MG TABS Take 0.5 tablets (5 mg total) by mouth every 4 (four) hours as needed for severe pain.  . [DISCONTINUED] atorvastatin (LIPITOR) 80 MG tablet Take 1 tablet (80 mg total) by mouth daily at 6 PM.  . [DISCONTINUED] bicalutamide (CASODEX) 50 MG tablet Take 1 tablet (50 mg total) by mouth daily.  . [DISCONTINUED] carvedilol (COREG) 6.25 MG tablet Take 1 tablet (6.25 mg total) by mouth 2 (two) times daily with a meal.  . [DISCONTINUED] Multiple Vitamin (MULTIVITAMIN WITH MINERALS) TABS tablet Take 1 tablet by mouth daily.     No Known Allergies  Social History   Socioeconomic History  . Marital status: Single    Spouse name: Not on file  . Number of children: Not on file  . Years of education: Not on file  . Highest education level: Not on file  Occupational History  . Occupation: unemployed  Tobacco Use  . Smoking status: Current Every Day Smoker    Packs/day: 0.25    Years: 45.00    Pack years: 11.25  . Smokeless tobacco: Never Used  Vaping Use  . Vaping Use: Never used  Substance and Sexual Activity  . Alcohol use: Yes    Alcohol/week: 75.0 standard drinks  Types: 75 Shots of liquor per week    Comment: 2 shots of liquor daily  . Drug use: Not Currently    Types: Marijuana    Comment: occasionally  . Sexual activity: Yes  Other Topics Concern  . Not on file  Social History Narrative   From [place]    Family lives in [place] (brother, sister and mother)    Unemployed    Drinks 3 (1/5)ths in 1 week   Social Determinants of Health   Financial Resource Strain: Low Risk   . Difficulty of Paying Living Expenses: Not very hard  Food Insecurity: No Food Insecurity  . Worried About Charity fundraiser in the Last Year: Never true  . Ran Out of Food in the Last  Year: Never true  Transportation Needs: No Transportation Needs  . Lack of Transportation (Medical): No  . Lack of Transportation (Non-Medical): No  Physical Activity: Inactive  . Days of Exercise per Week: 0 days  . Minutes of Exercise per Session: 0 min  Stress: Stress Concern Present  . Feeling of Stress : To some extent  Social Connections: Socially Isolated  . Frequency of Communication with Friends and Family: Three times a week  . Frequency of Social Gatherings with Friends and Family: Three times a week  . Attends Religious Services: Never  . Active Member of Clubs or Organizations: No  . Attends Archivist Meetings: Never  . Marital Status: Never married  Intimate Partner Violence:   . Fear of Current or Ex-Partner: Not on file  . Emotionally Abused: Not on file  . Physically Abused: Not on file  . Sexually Abused: Not on file     Review of Systems: General: negative for chills, fever, night sweats or weight changes.  Cardiovascular: negative for chest pain, dyspnea on exertion, edema, orthopnea, palpitations, paroxysmal nocturnal dyspnea or shortness of breath Dermatological: negative for rash Respiratory: negative for cough or wheezing Urologic: negative for hematuria Abdominal: negative for nausea, vomiting, diarrhea, bright red blood per rectum, melena, or hematemesis Neurologic: negative for visual changes, syncope, or dizziness All other systems reviewed and are otherwise negative except as noted above.    Blood pressure (!) 156/84, pulse 88, height 6\' 2"  (1.88 m), weight 170 lb (77.1 kg).  General appearance: alert and no distress Neck: no adenopathy, no carotid bruit, no JVD, supple, symmetrical, trachea midline and thyroid not enlarged, symmetric, no tenderness/mass/nodules Lungs: clear to auscultation bilaterally Heart: regular rate and rhythm, S1, S2 normal, no murmur, click, rub or gallop Extremities: extremities normal, atraumatic, no cyanosis  or edema Pulses: 2+ and symmetric Skin: Skin color, texture, turgor normal. No rashes or lesions Neurologic: Alert and oriented X 3, normal strength and tone. Normal symmetric reflexes. Normal coordination and gait  EKG not performed today  ASSESSMENT AND PLAN:   Essential hypertension History of essential hypertension blood pressure measured today 156/84.  He is on lisinopril.  Current smoker Continue tobacco use of 3 cigarettes a day.  CAD S/P percutaneous coronary angioplasty History of CAD status post LAD stenting at [facility] 04/05/2010.  He had intervention by Dr. Ellyn Hack in the setting of non-STEMI 11/19/2017.  He demonstrated a patent LAD stent and perform stenting of his proximal AV groove circumflex into his first obtuse marginal branch.  He did have a high-grade distal circumflex stenosis which was untreated.  He currently denies chest pain.  Hyperlipidemia LDL goal <70 History of hyperlipidemia previously on atorvastatin 80  mg a day which apparently he has not been taking this is reflected in his recent lipid profile revealing a total cholesterol of 244, LDL of 175 HDL 42 performed 12/23/2019.  We discussed importance of medication compliance.  We will recheck a lipid liver profile in 2 months  SOB (shortness of breath) on exertion Symptoms of shortness of breath which she expressed during his last office visit.  2D echo was performed on 02/01/2020 which is essentially normal with grade 1 diastolic dysfunction and mild MR.  He was going to get a Myoview stress test but because of some sciatica in his left leg decided not to pursue this.  In the interim, his shortness of breath has resolved spontaneously.      Lorretta Harp MD FACP,FACC,FAHA, [facility] 02/16/2020 4:08 PM

## 2020-02-16 NOTE — Patient Instructions (Signed)
Medication Instructions:  Your physician recommends that you continue on your current medications as directed. Please refer to the Current Medication list given to you today.  *If you need a refill on your cardiac medications before your next appointment, please call your pharmacy*   Lab Work: RETURN FOR FASTING LABS IN 2 MONTHS (Lipid, Hepatic)-I will mail you the orders closer to time.   If you have labs (blood work) drawn today and your tests are completely normal, you will receive your results only by: Marland Kitchen MyChart Message (if you have MyChart) OR . A paper copy in the mail If you have any lab test that is abnormal or we need to change your treatment, we will call you to review the results.  Follow-Up: At [facility], you and your health needs are our priority.  As part of our continuing mission to provide you with exceptional heart care, we have created designated Provider Care Teams.  These Care Teams include your primary Cardiologist (physician) and Advanced Practice Providers (APPs -  Physician Assistants and Nurse Practitioners) who all work together to provide you with the care you need, when you need it.  We recommend signing up for the patient portal called "MyChart".  Sign up information is provided on this After Visit Summary.  MyChart is used to connect with patients for Virtual Visits (Telemedicine).  Patients are able to view lab/test results, encounter notes, upcoming appointments, etc.  Non-urgent messages can be sent to your provider as well.   To learn more about what you can do with MyChart, go to [email or web address].    Your next appointment:   12 month(s)  The format for your next appointment:   In Person  Provider:   Quay Burow, MD

## 2020-02-16 NOTE — Assessment & Plan Note (Signed)
History of hyperlipidemia previously on atorvastatin 80 mg a day which apparently he has not been taking this is reflected in his recent lipid profile revealing a total cholesterol of 244, LDL of 175 HDL 42 performed 12/23/2019.  We discussed importance of medication compliance.  We will recheck a lipid liver profile in 2 months

## 2020-02-16 NOTE — Assessment & Plan Note (Signed)
Symptoms of shortness of breath which she expressed during his last office visit.  2D echo was performed on 02/01/2020 which is essentially normal with grade 1 diastolic dysfunction and mild MR.  He was going to get a Myoview stress test but because of some sciatica in his left leg decided not to pursue this.  In the interim, his shortness of breath has resolved spontaneously.

## 2020-02-18 ENCOUNTER — Other Ambulatory Visit: Payer: Self-pay | Admitting: Hematology and Oncology

## 2020-02-18 DIAGNOSIS — C61 Malignant neoplasm of prostate: Secondary | ICD-10-CM

## 2020-02-18 NOTE — Progress Notes (Signed)
[name] [name] [phone]   [phone]  PROGRESS NOTE  Patient Care Team: Rosilyn Mings. 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 Seen by pulmonary with plans for PET and biopsy, though he was lost to follow up.  3) 01/17/2020: sent to the ED by Orthopedic surgery due to metastasis of the spine noted on MRI. IN the ED CT C/A/P showed extensive left pelvic and retroperitoneal lymphadenopathy. Mediastinal and bilateral hilar lymphadenopathy as well as mixed lytic and sclerotic lesions are noted in the bony pelvis 4) 01/17/2020: PSA noted to be 1074.94 5) 01/22/2020: establish care with [name]. Started Bicalutamide 50mg  PO daily. Plan for 28 days of therapy.   6) 02/05/2020: Lupron 22.5mg  started.   Interval History:  [name] [name] 64 y.o. male with medical history significant for metastatic prostate cancer who presents for a follow up visit. The patient's last visit was on 01/22/2020. In the interim since the last visit Mr. Easterly has completed bicalutamide therapy and has started lupron therapy on 02/05/2020.   On exam today her Pastorino notes that he has been tolerating therapy well.  He notes he has not had any side effects from the bicalutamide therapy or the Lupron shot.  He denies having any issues with night sweats, chills, or hot flashes.  He also notes he has had no issues with urinary or GI symptoms.  His weight has been steadily increasing since therapy began.  He also notes a slight dip in his energy levels, but has otherwise been quite well.  On further  discussion he notes that he has had continued chronic pain shooting down his right leg from his known sciatica.  He is currently taking gabapentin therapy for this and reports that the sensation is typically "pins-and-needles".  In the toes.  He notes that also his leg from his buttocks down to his ankle tends to sweat at night.  He otherwise denies having any issues with fevers, chills, sweats, nausea, vomiting or diarrhea.  A full 10 point ROS is listed below.  MEDICAL HISTORY:  Past Medical History:  Diagnosis Date   Brain tumor [facility])    diagnosed by MRI 6 months ago   Cancer [facility])    Pt reports brain tumor 2 years ago   Coronary artery disease 2011   s/p BMS stent LAD, [facility], [facility] [place], [place], Dr Chauncey Cruel. [name]   Depression    Dyspnea    High cholesterol    History of herniated intervertebral disc    HTN (hypertension)    Hyperlipidemia LDL goal <70 07/21/2013   Lung nodule    NSTEMI (non-ST elevated myocardial infarction) ([place]) 11/11/2017   Smoker    1/2 ppd x >40 years     SURGICAL HISTORY: Past Surgical History:  Procedure Laterality Date   CORONARY ANGIOPLASTY WITH STENT PLACEMENT Left 2011   BMS LAD, [facility], [facility] [place], [place], Dr Chauncey Cruel. [name]   CORONARY STENT INTERVENTION N/A 11/12/2017   Procedure: CORONARY STENT INTERVENTION;  Surgeon: Leonie Man, MD;  Location: [place] [facility];  Service: Cardiovascular;  Laterality: N/A;   LEFT HEART CATH AND CORONARY ANGIOGRAPHY N/A 11/12/2017  Procedure: LEFT HEART CATH AND CORONARY ANGIOGRAPHY;  Surgeon: Leonie Man, MD;  Location: [place] [facility];  Service: Cardiovascular;  Laterality: N/A;    SOCIAL HISTORY: Social History   Socioeconomic History   Marital status: Single    Spouse name: Not on file   Number of children: Not on file   Years of education: Not on file   Highest education level: Not on file  Occupational History   Occupation: unemployed  Tobacco Use    Smoking status: Current Every Day Smoker    Packs/day: 0.25    Years: 45.00    Pack years: 11.25   Smokeless tobacco: Never Used  Scientific laboratory technician Use: Never used  Substance and Sexual Activity   Alcohol use: Yes    Alcohol/week: 75.0 standard drinks    Types: 75 Shots of liquor per week    Comment: 2 shots of liquor daily   Drug use: Not Currently    Types: Marijuana    Comment: occasionally   Sexual activity: Yes  Other Topics Concern   Not on file  Social History Narrative   From [place]    Family lives in [place] (brother, sister and mother)    Unemployed    Drinks 3 (1/5)ths in 1 week   Social Determinants of Health   Financial Resource Strain: Low Risk    Difficulty of Paying Living Expenses: Not very hard  Food Insecurity: No Food Insecurity   Worried About Charity fundraiser in the Last Year: Never true   Arboriculturist in the Last Year: Never true  Transportation Needs: No Transportation Needs   Lack of Transportation (Medical): No   Lack of Transportation (Non-Medical): No  Physical Activity: Inactive   Days of Exercise per Week: 0 days   Minutes of Exercise per Session: 0 min  Stress: Stress Concern Present   Feeling of Stress : To some extent  Social Connections: Socially Isolated   Frequency of Communication with Friends and Family: Three times a week   Frequency of Social Gatherings with Friends and Family: Three times a week   Attends Religious Services: Never   Active Member of Clubs or Organizations: No   Attends Archivist Meetings: Never   Marital Status: Never married  Human resources officer Violence:    Fear of Current or Ex-Partner: Not on file   Emotionally Abused: Not on file   Physically Abused: Not on file   Sexually Abused: Not on file    FAMILY HISTORY: Family History  Problem Relation Age of Onset   Diabetes Paternal Grandfather    Hypertension Paternal Grandfather     ALLERGIES:  has No  Known Allergies.  MEDICATIONS:  Current Outpatient Medications  Medication Sig Dispense Refill   aspirin 81 MG EC tablet Take 1 tablet (81 mg total) by mouth daily. 90 tablet 3   calcium-vitamin D (OSCAL WITH D) 500-200 MG-UNIT tablet Take 1 tablet by mouth daily with breakfast. 90 tablet 3   clopidogrel (PLAVIX) 75 MG tablet Take 1 tablet (75 mg total) by mouth daily. 90 tablet 3   gabapentin (NEURONTIN) 300 MG capsule Take 300 mg by mouth 3 (three) times daily as needed for pain.     lisinopril (ZESTRIL) 10 MG tablet Take 1 tablet (10 mg total) by mouth daily. 90 tablet 3   nitroGLYCERIN (NITROSTAT) 0.4 MG SL tablet Place 1 tablet (0.4 mg total) under the tongue every 5 (five) minutes as  needed for chest pain. 25 tablet 1   oxyCODONE 10 MG TABS Take 0.5 tablets (5 mg total) by mouth every 4 (four) hours as needed for severe pain. 30 tablet 0   No current facility-administered medications for this visit.    REVIEW OF SYSTEMS:   Constitutional: ( - ) fevers, ( - )  chills , ( - ) night sweats Eyes: ( - ) blurriness of vision, ( - ) double vision, ( - ) watery eyes Ears, nose, mouth, throat, and face: ( - ) mucositis, ( - ) sore throat Respiratory: ( - ) cough, ( - ) dyspnea, ( - ) wheezes Cardiovascular: ( - ) palpitation, ( - ) chest discomfort, ( - ) lower extremity swelling Gastrointestinal:  ( - ) nausea, ( - ) heartburn, ( - ) change in bowel habits Skin: ( - ) abnormal skin rashes Lymphatics: ( - ) new lymphadenopathy, ( - ) easy bruising Neurological: ( - ) numbness, ( - ) tingling, ( - ) new weaknesses Behavioral/Psych: ( - ) mood change, ( - ) new changes  All other systems were reviewed with the patient and are negative.  PHYSICAL EXAMINATION: ECOG PERFORMANCE STATUS: 1 - Symptomatic but completely ambulatory  Vitals:   02/19/20 1009  BP: 107/73  Pulse: 63  Resp: 18  Temp: 97.6 F (36.4 C)  SpO2: 100%   Filed Weights   02/19/20 1009  Weight: 169 lb 12.8 oz  (77 kg)    GENERAL:  Well appearing middle aged [place] American male. alert, no distress and comfortable SKIN: skin color, texture, turgor are normal, no rashes or significant lesions EYES: conjunctiva are pink and non-injected, sclera clear LUNGS: clear to auscultation and percussion with normal breathing effort HEART: regular rate & rhythm and no murmurs and no lower extremity edema Musculoskeletal: no cyanosis of digits and no clubbing  PSYCH: alert & oriented x 3, fluent speech NEURO: no focal motor/sensory deficits  LABORATORY DATA:  I have reviewed the data as listed CBC Latest Ref Rng & Units 02/19/2020 01/17/2020 12/11/2019  WBC 4.0 - 10.5 K/uL 6.4 6.4 6.3  Hemoglobin 13.0 - 17.0 g/dL 10.8(L) 10.5(L) 11.0(L)  Hematocrit 39 - 52 % 35.3(L) 34.6(L) 36.6(L)  Platelets 150 - 400 K/uL 186 259 240    CMP Latest Ref Rng & Units 02/19/2020 01/17/2020 12/23/2019  Glucose 70 - 99 mg/dL 99 103(H) -  BUN 8 - 23 mg/dL 22 21 -  Creatinine 0.61 - 1.24 mg/dL 1.60(H) 1.56(H) -  Sodium 135 - 145 mmol/L 139 138 -  Potassium 3.5 - 5.1 mmol/L 4.1 4.0 -  Chloride 98 - 111 mmol/L 106 103 -  CO2 22 - 32 mmol/L 25 24 -  Calcium 8.9 - 10.3 mg/dL 9.9 9.2 -  Total Protein 6.5 - 8.1 g/dL 7.8 8.3(H) 8.5  Total Bilirubin 0.3 - 1.2 mg/dL 0.3 0.5 <0.2  Alkaline Phos 38 - 126 U/L 255(H) 116 158(H)  AST 15 - 41 U/L 16 14(L) 14  ALT 0 - 44 U/L 12 17 10    RADIOGRAPHIC STUDIES: ECHOCARDIOGRAM COMPLETE  Result Date: 02/01/2020    ECHOCARDIOGRAM REPORT   Patient Name:   [name] [name] Date of Exam: 02/01/2020 Medical Rec #:  [id number]        Height:       74.0 in Accession #:    [id number]       Weight:       164.4 lb Date of Birth:  Feb 21, 1956  BSA:          2.000 m Patient Age:    48 years         BP:           113/67 mmHg Patient Gender: M                HR:           61 bpm. Exam Location:  [place] Procedure: 2D Echo, Cardiac Doppler, Color Doppler and Strain Analysis Indications:    R06.00 Dyspnea   History:        Patient has prior history of Echocardiogram examinations, most                 recent 11/12/2017.  Sonographer:    Marygrace Drought RCS Referring Phys: [place]  1. Left ventricular ejection fraction, by estimation, is 60 to 65%. The left ventricle has normal function. The left ventricle has no regional wall motion abnormalities. Left ventricular diastolic parameters are consistent with Grade I diastolic dysfunction (impaired relaxation). The average left ventricular global longitudinal strain is -19.4 %. The global longitudinal strain is normal.  2. Right ventricular systolic function is normal. The right ventricular size is normal. There is normal pulmonary artery systolic pressure.  3. Left atrial size was mildly dilated.  4. The mitral valve is normal in structure. Mild mitral valve regurgitation. No evidence of mitral stenosis.  5. The aortic valve is normal in structure. Aortic valve regurgitation is not visualized. Mild aortic valve sclerosis is present, with no evidence of aortic valve stenosis.  6. The inferior vena cava is normal in size with greater than 50% respiratory variability, suggesting right atrial pressure of 3 mmHg. FINDINGS  Left Ventricle: Left ventricular ejection fraction, by estimation, is 60 to 65%. The left ventricle has normal function. The left ventricle has no regional wall motion abnormalities. The average left ventricular global longitudinal strain is -19.4 %. The global longitudinal strain is normal. The left ventricular internal cavity size was normal in size. There is no left ventricular hypertrophy. Left ventricular diastolic parameters are consistent with Grade I diastolic dysfunction (impaired relaxation). Right Ventricle: The right ventricular size is normal. No increase in right ventricular wall thickness. Right ventricular systolic function is normal. There is normal pulmonary artery systolic pressure. The tricuspid regurgitant velocity  is 2.55 m/s, and  with an assumed right atrial pressure of 3 mmHg, the estimated right ventricular systolic pressure is 31.5 mmHg. Left Atrium: Left atrial size was mildly dilated. Right Atrium: Right atrial size was normal in size. Pericardium: There is no evidence of pericardial effusion. Mitral Valve: The mitral valve is normal in structure. Normal mobility of the mitral valve leaflets. Mild mitral valve regurgitation. No evidence of mitral valve stenosis. Tricuspid Valve: The tricuspid valve is normal in structure. Tricuspid valve regurgitation is not demonstrated. No evidence of tricuspid stenosis. Aortic Valve: The aortic valve is normal in structure. Aortic valve regurgitation is not visualized. Mild aortic valve sclerosis is present, with no evidence of aortic valve stenosis. Pulmonic Valve: The pulmonic valve was normal in structure. Pulmonic valve regurgitation is not visualized. No evidence of pulmonic stenosis. Aorta: The aortic root is normal in size and structure. Venous: The inferior vena cava is normal in size with greater than 50% respiratory variability, suggesting right atrial pressure of 3 mmHg. IAS/Shunts: No atrial level shunt detected by color flow Doppler.  LEFT VENTRICLE PLAX 2D LVIDd:  4.50 cm  Diastology LVIDs:         3.30 cm  LV e' lateral:   9.68 cm/s LV PW:         1.10 cm  LV E/e' lateral: 7.2 LV IVS:        1.10 cm  LV e' medial:    5.98 cm/s LVOT diam:     2.00 cm  LV E/e' medial:  11.7 LV SV:         52 LV SV Index:   26       2D Longitudinal Strain LVOT Area:     3.14 cm 2D Strain GLS Avg:     -19.4 %  RIGHT VENTRICLE RV Basal diam:  3.10 cm RV S prime:     10.80 cm/s TAPSE (M-mode): 2.4 cm RVSP:           29.0 mmHg LEFT ATRIUM             Index       RIGHT ATRIUM           Index LA diam:        3.20 cm 1.60 cm/m  RA Pressure: 3.00 mmHg LA Vol (A2C):   70.7 ml 35.35 ml/m RA Area:     15.10 cm LA Vol (A4C):   34.2 ml 17.10 ml/m RA Volume:   39.90 ml  19.95 ml/m LA  Biplane Vol: 57.4 ml 28.70 ml/m  AORTIC VALVE LVOT Vmax:   79.60 cm/s LVOT Vmean:  54.200 cm/s LVOT VTI:    0.165 m  AORTA Ao Root diam: 3.50 cm Ao Asc diam:  3.40 cm MITRAL VALVE                 TRICUSPID VALVE MV Area (PHT):               TR Peak grad:   26.0 mmHg MV Decel Time:               TR Vmax:        255.00 cm/s MR Peak grad:    119.7 mmHg  Estimated RAP:  3.00 mmHg MR Mean grad:    86.0 mmHg   RVSP:           29.0 mmHg MR Vmax:         547.00 cm/s MR Vmean:        440.0 cm/s  SHUNTS MR PISA:         0.57 cm    Systemic VTI:  0.16 m MR PISA Eff ROA: 3 mm       Systemic Diam: 2.00 cm MR PISA Radius:  0.30 cm MV E velocity: 69.80 cm/s MV A velocity: 79.70 cm/s MV E/A ratio:  0.88 Candee Furbish MD Electronically signed by Candee Furbish MD Signature Date/Time: 02/01/2020/2:14:21 PM    Final     ASSESSMENT & PLAN [name] [name] 64 y.o. male with medical history significant for metastatic prostate cancer who presents for a follow up visit.    Mr. Kraai today notes that he is tolerating therapy well without any symptoms of hot flashes, sweats, or other major issues.  He does have a decrease in energy, but is otherwise been tolerating therapy quite well.  Currently our plan is to obtain a biopsy of the prostate with the assistance of the urology service.  Once the biopsy is obtained and the diagnosis is confirmed we can begin treating with abiraterone 1000 mg p.o. daily with 5 mg  of prednisone.  This would be in addition to his q. 3 monthly 22.5 mg Lupron shots.  We have ordered a baseline testosterone and PSA level, which will be rechecked today after having started Casodex and Lupron therapy.  Once the Casodex is complete the patient no longer needs that medication any longer.  Our goal will be to start abiraterone therapy within 12 weeks of starting ADT.  There is no clear indication for bisphosphonate therapy in the setting of castrate sensitive prostate cancer with metastatic disease to the  bone.  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 Referral placed to urology  --continue bicalutamide 50mg  PO daily until 28 day course is completed. Patient received 1st dose of lupron 22.5mg  on 02/05/2020. Next due in Nov 2021.  --calcium/vitamin D for bone protection on ADT therapy.  --Additionally will plan to start patient on PO abiraterone therapy 1000mg  PO daily with 5mg  PO prednisone (after confirmed diagnosis with prostate biopsy).  --the literature does not currently support the use of bisphosphonates in the setting of CSPC.  --have patient RTC in 4 weeks to continue monitoring.  #Symptom management -- oxycodone 5mg  q4H PRN for pain --encourage OTC senna for constipation prevention   #Goals of Care --discussed the incurable nature of metastatic disease --emphasized that all treatments moving forward are focused on symptom control, decreasing tumor size/slow spread, and increasing lifespan.  --patient voiced understanding of the palliative nature of treatments moving forward.  --patients MPOA is Chanel Sarina Ill (his daughter)   Orders Placed This Encounter  Procedures   Ambulatory referral to Social Work    Referral Priority:   Routine    Referral Type:   Consultation    Referral Reason:   Specialty Services Required    Number of Visits Requested:   1    All questions were answered. The patient knows to call the clinic with any problems, questions or concerns.  A total of more than 30 minutes were spent on this encounter and over half of that time was spent on counseling and coordination of care as outlined above.   Ledell Peoples, MD Department of Hematology/Oncology [place] at [facility] Phone: [phone] Pager: [phone] Email:  [email or web address]  02/19/2020 11:14 AM   Literature Support:  Juanda Crumble A Systematic Review and Meta-Analysis about the Effect of Bisphosphonates on the Risk of Skeletal-Related Event in Men with Prostate Cancer. Anticancer Agents Med Chem. 2020;20(13):1604-1612.  --Our study demonstrated that bisphosphonates could not statistically significantly reduce the risk of SRE in patients with prostate cancer, neither in the subgroups with M1 or CSPC.

## 2020-02-19 ENCOUNTER — Encounter: Payer: Self-pay | Admitting: Hematology and Oncology

## 2020-02-19 ENCOUNTER — Other Ambulatory Visit: Payer: Self-pay

## 2020-02-19 ENCOUNTER — Inpatient Hospital Stay (HOSPITAL_BASED_OUTPATIENT_CLINIC_OR_DEPARTMENT_OTHER): Payer: Medicaid Other | Admitting: Hematology and Oncology

## 2020-02-19 ENCOUNTER — Inpatient Hospital Stay: Payer: Medicaid Other

## 2020-02-19 VITALS — BP 107/73 | HR 63 | Temp 97.6°F | Resp 18 | Ht 74.0 in | Wt 169.8 lb

## 2020-02-19 DIAGNOSIS — C61 Malignant neoplasm of prostate: Secondary | ICD-10-CM | POA: Diagnosis not present

## 2020-02-19 DIAGNOSIS — Z5111 Encounter for antineoplastic chemotherapy: Secondary | ICD-10-CM | POA: Diagnosis not present

## 2020-02-19 DIAGNOSIS — C7951 Secondary malignant neoplasm of bone: Secondary | ICD-10-CM | POA: Diagnosis not present

## 2020-02-19 DIAGNOSIS — R590 Localized enlarged lymph nodes: Secondary | ICD-10-CM | POA: Diagnosis not present

## 2020-02-19 LAB — CMP (CANCER CENTER ONLY)
ALT: 12 U/L (ref 0–44)
AST: 16 U/L (ref 15–41)
Albumin: 3.9 g/dL (ref 3.5–5.0)
Alkaline Phosphatase: 255 U/L — ABNORMAL HIGH (ref 38–126)
Anion gap: 8 (ref 5–15)
BUN: 22 mg/dL (ref 8–23)
CO2: 25 mmol/L (ref 22–32)
Calcium: 9.9 mg/dL (ref 8.9–10.3)
Chloride: 106 mmol/L (ref 98–111)
Creatinine: 1.6 mg/dL — ABNORMAL HIGH (ref 0.61–1.24)
GFR, Est AFR Am: 53 mL/min — ABNORMAL LOW (ref >60)
GFR, Estimated: 45 mL/min — ABNORMAL LOW (ref >60)
Glucose, Bld: 99 mg/dL (ref 70–99)
Potassium: 4.1 mmol/L (ref 3.5–5.1)
Sodium: 139 mmol/L (ref 135–145)
Total Bilirubin: 0.3 mg/dL (ref 0.3–1.2)
Total Protein: 7.8 g/dL (ref 6.5–8.1)

## 2020-02-19 LAB — CBC WITH DIFFERENTIAL (CANCER CENTER ONLY)
Abs Immature Granulocytes: 0.02 10*3/uL (ref 0.00–0.07)
Basophils Absolute: 0 10*3/uL (ref 0.0–0.1)
Basophils Relative: 1 %
Eosinophils Absolute: 0.2 10*3/uL (ref 0.0–0.5)
Eosinophils Relative: 2 %
HCT: 35.3 % — ABNORMAL LOW (ref 39.0–52.0)
Hemoglobin: 10.8 g/dL — ABNORMAL LOW (ref 13.0–17.0)
Immature Granulocytes: 0 %
Lymphocytes Relative: 26 %
Lymphs Abs: 1.7 10*3/uL (ref 0.7–4.0)
MCH: 25.5 pg — ABNORMAL LOW (ref 26.0–34.0)
MCHC: 30.6 g/dL (ref 30.0–36.0)
MCV: 83.5 fL (ref 80.0–100.0)
Monocytes Absolute: 0.5 10*3/uL (ref 0.1–1.0)
Monocytes Relative: 8 %
Neutro Abs: 4 10*3/uL (ref 1.7–7.7)
Neutrophils Relative %: 63 %
Platelet Count: 186 10*3/uL (ref 150–400)
RBC: 4.23 MIL/uL (ref 4.22–5.81)
RDW: 15.9 % — ABNORMAL HIGH (ref 11.5–15.5)
WBC Count: 6.4 10*3/uL (ref 4.0–10.5)
nRBC: 0 % (ref 0.0–0.2)

## 2020-02-19 MED ORDER — CALCIUM CARBONATE-VITAMIN D 500-200 MG-UNIT PO TABS: 1.0000 | ORAL_TABLET | Freq: Every day | ORAL | 3 refills | Status: AC

## 2020-02-20 LAB — PROSTATE-SPECIFIC AG, SERUM (LABCORP): Prostate Specific Ag, Serum: 25.8 ng/mL — ABNORMAL HIGH (ref 0.0–4.0)

## 2020-02-20 LAB — TESTOSTERONE: Testosterone: 10 ng/dL — ABNORMAL LOW (ref 264–916)

## 2020-02-22 ENCOUNTER — Telehealth: Payer: Self-pay | Admitting: Hematology and Oncology

## 2020-02-22 ENCOUNTER — Encounter: Payer: Self-pay | Admitting: Medical Oncology

## 2020-02-22 ENCOUNTER — Telehealth: Payer: Self-pay | Admitting: *Deleted

## 2020-02-22 NOTE — Telephone Encounter (Signed)
[name] patient regarding lab results from last week. Spoke with patient and advised that his PSA and testosterone levels are coming down nicely. Pt voiced understanding. He has not heard from [facility] yet about the date and time for his prostate biopsy. Willa advise Cira Rue, Nurse Navigator about this.

## 2020-02-22 NOTE — Progress Notes (Signed)
Spoke with [facility] regarding prostate biopsy. I informed her, patient was seen by Dr. Lovena Neighbours but has has not been scheduled for biopsy. I explained [name] saw him in hospital for bone mets and elevated PSA but needs  to confirm his diagnosis. Jenny Reichmann will message Dr. Lovena Neighbours and will call me with an update. I notified [name], RN.

## 2020-02-22 NOTE — Telephone Encounter (Signed)
Scheduled per los. Called and spoke with patient. Confirmed appt 

## 2020-02-22 NOTE — Telephone Encounter (Signed)
-----   Message from Orson Slick, MD sent at 02/22/2020  8:17 AM EDT ----- Please let Mr. Stock know that his labs look excellent. The PSA is down to 25.8 and testosterone levels are at goal. The next step is to assure we get a prostate biopsy.  ----- Message ----- From: Buel Ream, Lab In [place] Sent: 02/19/2020   9:58 AM EDT To: Orson Slick, MD

## 2020-02-22 NOTE — Progress Notes (Signed)
Spoke with patient to introduce myself as the prostate nurse navigator and discuss my role. I have not been able to meet him when he has been seen in the office. [name] dicussed his case with me and patient is waiting to be scheduled for prostate biopsy. He was seen by Dr. Lovena Neighbours @ [facility] but states he still has not be scheduled for biopsy. I will reach out to Dr. Jackson Latino office and update him.

## 2020-02-25 ENCOUNTER — Telehealth: Payer: Self-pay | Admitting: Cardiovascular Disease

## 2020-02-25 ENCOUNTER — Telehealth: Payer: Self-pay | Admitting: *Deleted

## 2020-02-25 NOTE — Telephone Encounter (Signed)
Patient called - left message that his insurance did not cover Vit D prescribed by [name], stating his brother had some Vit D (45mcg) and asked if he could take that.  Contacted patient - advised not to take medication from brother. Advised him that [facility] w/D was available OTC - he should talk with pharmacist. He stated that he had done that since leaving the message and that pharmacy had advised him that it was OTC as well. He stated he will contact office if he has further questions or concerns.

## 2020-02-25 NOTE — Telephone Encounter (Signed)
Patient was called the wrong office

## 2020-03-01 ENCOUNTER — Telehealth: Payer: Self-pay

## 2020-03-01 NOTE — Telephone Encounter (Signed)
Contacted patient to verify telephone visit for pre reg °

## 2020-03-02 ENCOUNTER — Inpatient Hospital Stay: Payer: Medicaid Other | Attending: Hematology and Oncology

## 2020-03-02 NOTE — Progress Notes (Signed)
Nutrition Follow-up:  Patient with metastatic prostate cancer.  Patient taking bicalutamide and lupron. Followed by Dr. Leeanne Rio with patient for nutrition follow-up via phone.  Patient reports that he has increased his eating and has gained weight.  Reports that he typically eats oatmeal or grit or bacon and eggs.  Lunch is sandwich (meat or peanut butter and jelly) and dinner is meat, vegetables, sometimes beans.  Has not tried oral nutrition supplements   Medications: reviewed  Labs: reviewed  Anthropometrics:   Weight 169 lb 12.8 oz on 8/27 increased from 164 lb   NUTRITION DIAGNOSIS: Inadequate oral intake improved   INTERVENTION:  Discussed oral nutrition supplements as option for added calories and protein between meals. Patient will try them.  Encouraged continued good nutrition with increased calories and protein to prevent weight loss Contact information provided to patient    NEXT VISIT: no follow-up.  Patient to contact RD with nutrition changes or concerns  Darice Vicario B. Zenia Resides, [place], [place] Registered Dietitian [phone] (mobile)

## 2020-03-03 ENCOUNTER — Encounter: Payer: Self-pay | Admitting: Hematology and Oncology

## 2020-03-03 ENCOUNTER — Encounter: Payer: Self-pay | Admitting: Medical Oncology

## 2020-03-04 ENCOUNTER — Encounter: Payer: Self-pay | Admitting: Medical Oncology

## 2020-03-04 NOTE — Progress Notes (Signed)
Spoke with [facility] to see if we can get prostate biopsy rescheduled to an earlier date. He saw Dr. Carlus Pavlov but was not scheduled for biopsy. It was scheduled 10/28 but [name] would like to add therapy but needs a definitive diagnosis before doing so. After researching, he will need to hold Plavix for 5 days so he will need approval from cardiology. He is insured by [facility] and he will need prior approval Tammy will get these started and has also blocked a few dates in late September in hopes of getting the approvals. I will speak with patient and his daughter to update them. [name] and [name], RN notified of the above.

## 2020-03-04 NOTE — Progress Notes (Signed)
Spoke with patient to inform him I am working with [facility] to get his prostate biopsy rescheduled to an earlier date.I informed him his cardiologist will need to approve stopping Plavix for 5 days and [facility] has to get approval from his insurance.  He states he received a call this morning asking to confirm his cardiologist in regards to Plavix. Once we get these two approvals, we will contact him with new date. He was very appreciative of my help and voiced understanding of the above. I will contacted  daughter Chanel, with update and gave her my contact information.

## 2020-03-08 ENCOUNTER — Telehealth: Payer: Self-pay | Admitting: *Deleted

## 2020-03-08 MED FILL — HYDROCODON-APAP 5-325: 5-325 | 1 days supply | Qty: 1 | Fill #0

## 2020-03-08 MED FILL — levoFLOXacin 750 MG TABS: 750 | 3 days supply | Qty: 3 | Fill #0

## 2020-03-08 NOTE — Telephone Encounter (Signed)
   [facility] Pre-operative Risk Assessment    HEARTCARE STAFF: - Please ensure there is not already an duplicate clearance open for this procedure. - Under Visit Info/Reason for Call, type in Other and utilize the format Clearance MM/DD/YY or Clearance TBD. Do not use dashes or single digits. - If request is for dental extraction, please clarify the # of teeth to be extracted.  Request for surgical clearance:  1. What type of surgery is being performed? Prostrate Ultrasound Biopsy   2. When is this surgery scheduled? 04/21/2020   3. What type of clearance is required (medical clearance vs. Pharmacy clearance to hold med vs. Both)? Medical  4. Are there any medications that need to be held prior to surgery and how long? Aspirin and Plavix for 5 days   5. Practice name and name of physician performing surgery? [facility]   6. What is the office phone number? [phone]   7.   What is the office fax number? [phone]  8.   Anesthesia type (None, local, MAC, general) ? unknown   Barbaraann Barthel 03/08/2020, 5:21 PM  _________________________________________________________________   (provider comments below)

## 2020-03-09 NOTE — Telephone Encounter (Signed)
Latisia from [facility] called. She wanted to know if the patient would be cleared to have his procedure on either 09/24 or 09/30. Please advise

## 2020-03-14 ENCOUNTER — Encounter: Payer: Self-pay | Admitting: Medical Oncology

## 2020-03-14 NOTE — Progress Notes (Signed)
Spoke with Tammy-[facility] to let her know Dr. Gwenlyn Found cleared patient for prostate biopsy. Tammy will notify Dr. Jackson Latino nurse and he will be scheduled for prostate biopsy 9/30. [name] notified.

## 2020-03-14 NOTE — Telephone Encounter (Signed)
Okay to hold antiplatelet agents for biopsy

## 2020-03-14 NOTE — Progress Notes (Signed)
Spoke with Leah in Dr. Kennon Holter office regarding cardiac clearance for prostate biopsy. I explained that we are trying to get biopsy moved to September 24 or 30 but we need clearance. She states they thought it was going to be 10/28. She will send to Dr. Naida Sleight nurse for clearance.

## 2020-03-14 NOTE — Telephone Encounter (Signed)
Robin from the cancer center is following up. She states they are working with [facility] on this patient and would like to try a new drug on him. This cannot be done until he has this biopsy. She states that [facility] has an opening for 09/30 and they would like to get him in that day. Please advise on surgical Clearance.

## 2020-03-14 NOTE — Telephone Encounter (Signed)
   Primary Cardiologist: Quay Burow, MD  Chart reviewed as part of pre-operative protocol coverage. Patient was contacted 03/14/2020 in reference to pre-operative risk assessment for pending surgery as outlined below.  [name] [name] was last seen on 02/16/20 by Dr. Gwenlyn Found with history of CAD s/p prior PCI 2011 and NSTEMI 2019 s/p PCI, normal LVEF, HTN, tobacco, HLD. I spoke with patient who affirms he is doing well without any new cardiac complaints. Therefore, based on ACC/AHA guidelines, the patient would be at acceptable risk for the planned procedure without further cardiovascular testing.   The patient was advised that if he develops new symptoms prior to surgery to contact our office to arrange for a follow-up visit, and he verbalized understanding.  Per Dr. Gwenlyn Found, [name] to hold antiplatelet medications as requested.  I will route this recommendation to the requesting party via Epic fax function and remove from pre-op pool. Please call with questions.  Charlie Pitter, PA-C 03/14/2020, 10:28 AM

## 2020-03-14 NOTE — Progress Notes (Signed)
Spoke with Tammy at [facility] on biopsy status. She states they reached out to Dr. Kennon Holter office last Monday for clearance but have not heard back. As of today, he will not be able to get biopsy done 9/24 due to Plavix. I will follow up with Dr. Kennon Holter office regarding clearance.

## 2020-03-15 ENCOUNTER — Encounter: Payer: Self-pay | Admitting: Medical Oncology

## 2020-03-15 NOTE — Progress Notes (Signed)
Spoke with patient to inform him it is ok to get steroid injection prior to prostate biopsy per Dr. [facility]. He voiced understanding and appreciation for the call.

## 2020-03-17 ENCOUNTER — Telehealth: Payer: Self-pay | Admitting: *Deleted

## 2020-03-17 ENCOUNTER — Telehealth: Payer: Self-pay | Admitting: Hematology and Oncology

## 2020-03-17 NOTE — Telephone Encounter (Signed)
[name] patient and spoke with him. Advised that he does not need to come to his appt tomorrow as he will be having his prostate biopsy done on 03/24/20. Advised that we would schedule his appt approx.1 week after his biopsy. He voiced understanding.  Scheduling message sent

## 2020-03-17 NOTE — Telephone Encounter (Signed)
Scheduled appt per 9/23 sch msg - pt aware of appt

## 2020-03-18 ENCOUNTER — Other Ambulatory Visit: Payer: Medicaid Other

## 2020-03-18 ENCOUNTER — Ambulatory Visit: Payer: Medicaid Other | Admitting: Hematology and Oncology

## 2020-03-31 ENCOUNTER — Ambulatory Visit: Payer: Medicaid Other | Admitting: Hematology and Oncology

## 2020-03-31 ENCOUNTER — Encounter: Payer: Self-pay | Admitting: Medical Oncology

## 2020-03-31 ENCOUNTER — Other Ambulatory Visit: Payer: Medicaid Other

## 2020-03-31 NOTE — Progress Notes (Signed)
[facility], called  to inform us patient had prostate biopsy yesterday. [name] notified.

## 2020-04-05 ENCOUNTER — Encounter: Payer: Self-pay | Admitting: Medical Oncology

## 2020-04-05 ENCOUNTER — Other Ambulatory Visit: Payer: Self-pay | Admitting: *Deleted

## 2020-04-05 DIAGNOSIS — E785 Hyperlipidemia, unspecified: Secondary | ICD-10-CM

## 2020-04-05 NOTE — Progress Notes (Signed)
Received prostate biopsy pathology report and given to [name], RN with [name].

## 2020-04-06 ENCOUNTER — Other Ambulatory Visit: Payer: Self-pay

## 2020-04-06 ENCOUNTER — Inpatient Hospital Stay: Payer: Medicaid Other | Attending: Hematology and Oncology

## 2020-04-06 ENCOUNTER — Telehealth: Payer: Self-pay

## 2020-04-06 ENCOUNTER — Other Ambulatory Visit: Payer: Self-pay | Admitting: Hematology and Oncology

## 2020-04-06 ENCOUNTER — Encounter: Payer: Self-pay | Admitting: Hematology and Oncology

## 2020-04-06 ENCOUNTER — Inpatient Hospital Stay (HOSPITAL_BASED_OUTPATIENT_CLINIC_OR_DEPARTMENT_OTHER): Payer: Medicaid Other | Admitting: Hematology and Oncology

## 2020-04-06 ENCOUNTER — Telehealth: Payer: Self-pay | Admitting: Pharmacist

## 2020-04-06 VITALS — BP 132/76 | HR 64 | Temp 97.6°F | Resp 18 | Ht 74.0 in | Wt 176.7 lb

## 2020-04-06 DIAGNOSIS — C61 Malignant neoplasm of prostate: Secondary | ICD-10-CM | POA: Diagnosis present

## 2020-04-06 DIAGNOSIS — C7951 Secondary malignant neoplasm of bone: Secondary | ICD-10-CM | POA: Diagnosis not present

## 2020-04-06 DIAGNOSIS — R5383 Other fatigue: Secondary | ICD-10-CM | POA: Insufficient documentation

## 2020-04-06 DIAGNOSIS — R590 Localized enlarged lymph nodes: Secondary | ICD-10-CM

## 2020-04-06 DIAGNOSIS — Z7289 Other problems related to lifestyle: Secondary | ICD-10-CM | POA: Insufficient documentation

## 2020-04-06 DIAGNOSIS — F17210 Nicotine dependence, cigarettes, uncomplicated: Secondary | ICD-10-CM | POA: Insufficient documentation

## 2020-04-06 LAB — CMP (CANCER CENTER ONLY)
ALT: 19 U/L (ref 0–44)
AST: 20 U/L (ref 15–41)
Albumin: 3.9 g/dL (ref 3.5–5.0)
Alkaline Phosphatase: 79 U/L (ref 38–126)
Anion gap: 5 (ref 5–15)
BUN: 15 mg/dL (ref 8–23)
CO2: 29 mmol/L (ref 22–32)
Calcium: 10.2 mg/dL (ref 8.9–10.3)
Chloride: 106 mmol/L (ref 98–111)
Creatinine: 1.47 mg/dL — ABNORMAL HIGH (ref 0.61–1.24)
GFR, Estimated: 50 mL/min — ABNORMAL LOW (ref >60)
Glucose, Bld: 95 mg/dL (ref 70–99)
Potassium: 4 mmol/L (ref 3.5–5.1)
Sodium: 140 mmol/L (ref 135–145)
Total Bilirubin: 0.3 mg/dL (ref 0.3–1.2)
Total Protein: 8.2 g/dL — ABNORMAL HIGH (ref 6.5–8.1)

## 2020-04-06 LAB — CBC WITH DIFFERENTIAL (CANCER CENTER ONLY)
Abs Immature Granulocytes: 0.02 10*3/uL (ref 0.00–0.07)
Basophils Absolute: 0.1 10*3/uL (ref 0.0–0.1)
Basophils Relative: 1 %
Eosinophils Absolute: 0.2 10*3/uL (ref 0.0–0.5)
Eosinophils Relative: 2 %
HCT: 37.4 % — ABNORMAL LOW (ref 39.0–52.0)
Hemoglobin: 11.7 g/dL — ABNORMAL LOW (ref 13.0–17.0)
Immature Granulocytes: 0 %
Lymphocytes Relative: 31 %
Lymphs Abs: 1.9 10*3/uL (ref 0.7–4.0)
MCH: 26.1 pg (ref 26.0–34.0)
MCHC: 31.3 g/dL (ref 30.0–36.0)
MCV: 83.5 fL (ref 80.0–100.0)
Monocytes Absolute: 0.5 10*3/uL (ref 0.1–1.0)
Monocytes Relative: 8 %
Neutro Abs: 3.7 10*3/uL (ref 1.7–7.7)
Neutrophils Relative %: 58 %
Platelet Count: 189 10*3/uL (ref 150–400)
RBC: 4.48 MIL/uL (ref 4.22–5.81)
RDW: 15.8 % — ABNORMAL HIGH (ref 11.5–15.5)
WBC Count: 6.3 10*3/uL (ref 4.0–10.5)
nRBC: 0 % (ref 0.0–0.2)

## 2020-04-06 MED ORDER — PREDNISONE 5 MG PO TABS
5.0000 mg | ORAL_TABLET | Freq: Every day | ORAL | 1 refills | Status: DC
Start: 2020-04-06 — End: 2020-08-10

## 2020-04-06 MED ORDER — ABIRATERONE ACETATE 250 MG PO TABS
1000.0000 mg | ORAL_TABLET | Freq: Every day | ORAL | 1 refills | Status: DC
Start: 2020-04-06 — End: 2020-04-26

## 2020-04-06 NOTE — Telephone Encounter (Signed)
Oral Oncology Pharmacist Encounter  Received new prescription for Zytiga (abiraterone acetate) for the treatment of metastatic castrate sensitive prostate cancer in conjunction with prednisone and ADT, planned duration until disease progression or unacceptable drug toxicity.  Prescription dose and frequency assessed for appropriateness. Appropriate for therapy initiation.   CBC w/ Diff and CMP from 04/06/20 assessed, noted Scr 1.47 mg/dL (CrCl ~59 mL/min) - no renal dose adjustments required for Zytiga.  Current medication list in Epic reviewed, no significant/relevant DDIs with Zytiga identified.  Evaluated chart and no patient barriers to medication adherence noted.   Prescription has been e-scribed to the [facility] for benefits analysis and approval.  Oral Oncology Clinic will continue to follow for insurance authorization, copayment issues, initial counseling and start date.  Leron Croak, PharmD, BCPS Hematology/Oncology Clinical Pharmacist [facility] [phone] 04/06/2020 12:55 PM

## 2020-04-06 NOTE — Telephone Encounter (Signed)
Oral Oncology Patient Advocate Encounter  After completing a benefits investigation, prior authorization for Zytiga is not required at this time through [place].  Patient's copay is $3.  [place] Patient [facility] Phone [phone] Fax [phone] 04/06/2020 1:10 PM

## 2020-04-06 NOTE — Progress Notes (Signed)
[place] Telephone:([phone]   Fax:([phone]) [phone]  PROGRESS NOTE  Patient Care Team: Rosilyn Mings. as PCP - General Lorretta Harp, MD as PCP - Cardiology (Cardiology)  Hematological/Oncological History # Metastatic Castrate Sensitive Prostate Cancer 1) 10/13/2019: CXR in the ED for shortness of breath showed masslike soft tissue fullness within the right hilum concerning for right hilar lymphadenopathy 2) 10/13/2019: CT Chest W contrast showed mediastinal and bilateral hilar adenopathy, right greater than left. Seen by pulmonary with plans for PET and biopsy, though he was lost to follow up.  3) 01/17/2020: sent to the ED by Orthopedic surgery due to metastasis of the spine noted on MRI. IN the ED CT C/A/P showed extensive left pelvic and retroperitoneal lymphadenopathy. Mediastinal and bilateral hilar lymphadenopathy as well as mixed lytic and sclerotic lesions are noted in the bony pelvis 4) 01/17/2020: PSA noted to be 1074.94 5) 01/22/2020: establish care with [name]. Started Bicalutamide 50mg  PO daily. Plan for 28 days of therapy.   6) 02/05/2020: Lupron 22.5mg  started.  7) 03/03/2020: received 45mg  lupron with urology. PSA 9.6 8) 03/30/2020: prostate biopsy performed, confirmed adenocarcinoma of the prostate.   Interval History:  Davinder Haff 64 y.o. male with medical history significant for metastatic prostate cancer who presents for a follow up visit. The patient's last visit was on 02/19/2020. In the interim since the last visit Mr. Manna underwent a prostate biopsy on 03/30/2020 and received lupron 45mg  on 03/03/2020.    On exam today her [place] notes been doing well in the interim since our last visit.  He notes that he has been having fatigue, and no energy.  He notes that he is not having any napping throughout the day and denies having weakening of his muscles.  He reports that his appetite has been good and his bowels are moving fine.  He denies  having any hot flashes, fevers, chills, sweats, nausea, vomiting or diarrhea.  Additionally he notes that his pain is currently under good control and he is not required any of his oxycodone medication in order to help with this.  The bulk of our discussion today focused on additional treatments for his prostate cancer including abiraterone or enzalutamide.  The details of this discussion are listed below.  MEDICAL HISTORY:  Past Medical History:  Diagnosis Date  . Brain tumor [facility])    diagnosed by MRI 6 months ago  . Cancer ([place])    Pt reports brain tumor 2 years ago  . Coronary artery disease 2011   s/p BMS stent LAD, [facility], [facility] [place], [place], Dr Chauncey Cruel. [name]  . Depression   . Dyspnea   . High cholesterol   . History of herniated intervertebral disc   . HTN (hypertension)   . Hyperlipidemia LDL goal <70 07/21/2013  . Lung nodule   . NSTEMI (non-ST elevated myocardial infarction) ([place]) 11/11/2017  . Smoker    1/2 ppd x >40 years     SURGICAL HISTORY: Past Surgical History:  Procedure Laterality Date  . CORONARY ANGIOPLASTY WITH STENT PLACEMENT Left 2011   BMS LAD, [facility], [facility] [place], [place], Dr Chauncey Cruel. [name]  . CORONARY STENT INTERVENTION N/A 11/12/2017   Procedure: CORONARY STENT INTERVENTION;  Surgeon: Leonie Man, MD;  Location: [place] CV LAB;  Service: Cardiovascular;  Laterality: N/A;  . LEFT HEART CATH AND CORONARY ANGIOGRAPHY N/A 11/12/2017   Procedure: LEFT HEART CATH AND CORONARY ANGIOGRAPHY;  Surgeon: Leonie Man, MD;  Location: [facility]  INVASIVE CV LAB;  Service: Cardiovascular;  Laterality: N/A;    SOCIAL HISTORY: Social History   Socioeconomic History  . Marital status: Single    Spouse name: Not on file  . Number of children: Not on file  . Years of education: Not on file  . Highest education level: Not on file  Occupational History  . Occupation: unemployed  Tobacco Use  . Smoking status: Current Every Day Smoker    Packs/day:  0.25    Years: 45.00    Pack years: 11.25  . Smokeless tobacco: Never Used  Vaping Use  . Vaping Use: Never used  Substance and Sexual Activity  . Alcohol use: Yes    Alcohol/week: 75.0 standard drinks    Types: 75 Shots of liquor per week    Comment: 2 shots of liquor daily  . Drug use: Not Currently    Types: Marijuana    Comment: occasionally  . Sexual activity: Yes  Other Topics Concern  . Not on file  Social History Narrative   From [place]    Family lives in [place] (brother, sister and mother)    Unemployed    Drinks 3 (1/5)ths in 1 week   Social Determinants of Health   Financial Resource Strain: Low Risk   . Difficulty of Paying Living Expenses: Not very hard  Food Insecurity: No Food Insecurity  . Worried About Charity fundraiser in the Last Year: Never true  . Ran Out of Food in the Last Year: Never true  Transportation Needs: No Transportation Needs  . Lack of Transportation (Medical): No  . Lack of Transportation (Non-Medical): No  Physical Activity: Inactive  . Days of Exercise per Week: 0 days  . Minutes of Exercise per Session: 0 min  Stress: Stress Concern Present  . Feeling of Stress : To some extent  Social Connections: Socially Isolated  . Frequency of Communication with Friends and Family: Three times a week  . Frequency of Social Gatherings with Friends and Family: Three times a week  . Attends Religious Services: Never  . Active Member of Clubs or Organizations: No  . Attends Archivist Meetings: Never  . Marital Status: Never married  Intimate Partner Violence:   . Fear of Current or Ex-Partner: Not on file  . Emotionally Abused: Not on file  . Physically Abused: Not on file  . Sexually Abused: Not on file    FAMILY HISTORY: Family History  Problem Relation Age of Onset  . Diabetes Paternal Grandfather   . Hypertension Paternal Grandfather     ALLERGIES:  has No Known Allergies.  MEDICATIONS:  Current Outpatient  Medications  Medication Sig Dispense Refill  . aspirin 81 MG EC tablet Take 1 tablet (81 mg total) by mouth daily. 90 tablet 3  . calcium-vitamin D (OSCAL WITH D) 500-200 MG-UNIT tablet Take 1 tablet by mouth daily with breakfast. 90 tablet 3  . clopidogrel (PLAVIX) 75 MG tablet Take 1 tablet (75 mg total) by mouth daily. 90 tablet 3  . gabapentin (NEURONTIN) 300 MG capsule Take 300 mg by mouth 3 (three) times daily as needed for pain.    Marland Kitchen lisinopril (ZESTRIL) 10 MG tablet Take 1 tablet (10 mg total) by mouth daily. 90 tablet 3  . nitroGLYCERIN (NITROSTAT) 0.4 MG SL tablet Place 1 tablet (0.4 mg total) under the tongue every 5 (five) minutes as needed for chest pain. 25 tablet 1  . oxyCODONE 10 MG TABS Take 0.5 tablets (  5 mg total) by mouth every 4 (four) hours as needed for severe pain. (Patient not taking: Reported on 04/06/2020) 30 tablet 0   No current facility-administered medications for this visit.    REVIEW OF SYSTEMS:   Constitutional: ( - ) fevers, ( - )  chills , ( - ) night sweats Eyes: ( - ) blurriness of vision, ( - ) double vision, ( - ) watery eyes Ears, nose, mouth, throat, and face: ( - ) mucositis, ( - ) sore throat Respiratory: ( - ) cough, ( - ) dyspnea, ( - ) wheezes Cardiovascular: ( - ) palpitation, ( - ) chest discomfort, ( - ) lower extremity swelling Gastrointestinal:  ( - ) nausea, ( - ) heartburn, ( - ) change in bowel habits Skin: ( - ) abnormal skin rashes Lymphatics: ( - ) new lymphadenopathy, ( - ) easy bruising Neurological: ( - ) numbness, ( - ) tingling, ( - ) new weaknesses Behavioral/Psych: ( - ) mood change, ( - ) new changes  All other systems were reviewed with the patient and are negative.  PHYSICAL EXAMINATION: ECOG PERFORMANCE STATUS: 1 - Symptomatic but completely ambulatory  Vitals:   04/06/20 0812  BP: 132/76  Pulse: 64  Resp: 18  Temp: 97.6 F (36.4 C)  SpO2: 100%   Filed Weights   04/06/20 0812  Weight: 176 lb 11.2 oz (80.2 kg)     GENERAL:  Well appearing middle aged [place] American male. alert, no distress and comfortable SKIN: skin color, texture, turgor are normal, no rashes or significant lesions EYES: conjunctiva are pink and non-injected, sclera clear LUNGS: clear to auscultation and percussion with normal breathing effort HEART: regular rate & rhythm and no murmurs and no lower extremity edema Musculoskeletal: no cyanosis of digits and no clubbing  PSYCH: alert & oriented x 3, fluent speech NEURO: no focal motor/sensory deficits  LABORATORY DATA:  I have reviewed the data as listed CBC Latest Ref Rng & Units 02/19/2020 01/17/2020 12/11/2019  WBC 4.0 - 10.5 K/uL 6.4 6.4 6.3  Hemoglobin 13.0 - 17.0 g/dL 10.8(L) 10.5(L) 11.0(L)  Hematocrit 39 - 52 % 35.3(L) 34.6(L) 36.6(L)  Platelets 150 - 400 K/uL 186 259 240    CMP Latest Ref Rng & Units 02/19/2020 01/17/2020 12/23/2019  Glucose 70 - 99 mg/dL 99 103(H) -  BUN 8 - 23 mg/dL 22 21 -  Creatinine 0.61 - 1.24 mg/dL 1.60(H) 1.56(H) -  Sodium 135 - 145 mmol/L 139 138 -  Potassium 3.5 - 5.1 mmol/L 4.1 4.0 -  Chloride 98 - 111 mmol/L 106 103 -  CO2 22 - 32 mmol/L 25 24 -  Calcium 8.9 - 10.3 mg/dL 9.9 9.2 -  Total Protein 6.5 - 8.1 g/dL 7.8 8.3(H) 8.5  Total Bilirubin 0.3 - 1.2 mg/dL 0.3 0.5 <0.2  Alkaline Phos 38 - 126 U/L 255(H) 116 158(H)  AST 15 - 41 U/L 16 14(L) 14  ALT 0 - 44 U/L 12 17 10    RADIOGRAPHIC STUDIES: No results found.  ASSESSMENT & PLAN Cavon Nicolls 64 y.o. male with medical history significant for metastatic prostate cancer who presents for a follow up visit.    Mr. Appling today notes that he is tolerating therapy well without any symptoms of hot flashes, sweats, or other major issues.  He does have fatigue, but is otherwise been tolerating therapy quite well. Weight has increased by 7 lbs in the interim.   Today we discussed the treatment options moving forward including  the addition of abiraterone or enzalutamide to his ADT  therapy.  I discussed the side effects of both of these medications and the required follow-up.  After weighing the pros and cons the patient wanted to proceed with the abiraterone therapy and the close lab follow-up and steroid treatment that it entails.  The prostate biopsy has been obtained and confirms adenocarcinoma of the prostate. We can begin treating with abiraterone 1000 mg p.o. daily with 5 mg of prednisone.  This would be in addition to his q. 3 monthly 22.5 mg Lupron shots (most recent shot was a 45mg  on 03/03/2020, next due March 2022).  We order repeat testosterone and PSA level today, which will be rechecked q 3 months.   Our goal will be to start abiraterone therapy within 12 weeks of starting ADT.  There is no clear indication for bisphosphonate therapy in the setting of castrate sensitive prostate cancer with metastatic disease to the bone.  # Metastatic Castrate Sensitive Prostate Cancer  --patient due for repeat CT C/A/P Scan and NM bone scan this month. Will repeat q 3 months initially.  --prostate biopsy performed by urology on 03/30/2020, confirms diagnosis of prostate adenocarcinoma. Gleason score not calculated due to ADT therapy  --Received dose of lupron 45mg  on 02/29/2020. Next due in March 2022. --calcium/vitamin D for bone protection on ADT therapy.  --prescribed PO abiraterone therapy 1000mg  PO daily with 5mg  PO prednisone today, plan to monitor with q 2 week labs and q 1 month f/u.   --the literature does not currently support the use of bisphosphonates in the setting of CSPC.  --have patient RTC 4 weeks after start of abiraterone to continue monitoring. Patient will require q 2 week labs.   #Symptom management -- oxycodone 5mg  q4H PRN for pain. He has not required these as his pain is currently under control --encourage OTC senna for constipation prevention if taking opioids.  #Goals of Care --discussed the incurable nature of metastatic disease --emphasized that all  treatments moving forward are focused on symptom control, decreasing tumor size/slow spread, and increasing lifespan.  --patient voiced understanding of the palliative nature of treatments moving forward.  --patients MPOA is Chanel Sarina Ill (his daughter)   No orders of the defined types were placed in this encounter.   All questions were answered. The patient knows to call the clinic with any problems, questions or concerns.  A total of more than 30 minutes were spent on this encounter and over half of that time was spent on counseling and coordination of care as outlined above.   Ledell Peoples, MD Department of Hematology/Oncology [place] at [facility] Phone: [phone] Pager: [phone] Email: [email or web address]  04/06/2020 8:46 AM   Literature Support:  Juanda Crumble A Systematic Review and Meta-Analysis about the Effect of Bisphosphonates on the Risk of Skeletal-Related Event in Men with Prostate Cancer. Anticancer Agents Med Chem. 2020;20(13):1604-1612.  --Our study demonstrated that bisphosphonates could not statistically significantly reduce the risk of SRE in patients with prostate cancer, neither in the subgroups with M1 or CSPC.

## 2020-04-07 LAB — TESTOSTERONE: Testosterone: 3 ng/dL — ABNORMAL LOW (ref 264–916)

## 2020-04-07 LAB — PROSTATE-SPECIFIC AG, SERUM (LABCORP): Prostate Specific Ag, Serum: 0.9 ng/mL (ref 0.0–4.0)

## 2020-04-07 MED FILL — ABIRATERONE ACETATE 250 MG: 250 | 30 days supply | Qty: 120 | Fill #0

## 2020-04-07 MED FILL — predniSONE 5 MG TABS: 5 | 30 days supply | Qty: 30 | Fill #0

## 2020-04-07 NOTE — Telephone Encounter (Signed)
Oral Chemotherapy Pharmacist Encounter  I spoke with patient for overview of: Zytiga for the treatment of metastatic, castration-sensitive prostate cancer in conjunction with prednisone and ADT, planned duration until disease progression or unacceptable toxicity.   Counseled patient on administration, dosing, side effects, monitoring, drug-food interactions, safe handling, storage, and disposal.  Patient will take Zytiga 250mg  tablets, 4 tablets (1000mg ) by mouth once daily on an empty stomach, 1 hour before or 2 hours after a meal.  Patient states he will take his Zytiga in the morning when he wakes up and will wait at least 1 hour before eating.  Patient will take prednisone 5mg  tablet, 1 tablet by mouth one daily with breakfast.  Zytiga start date: 04/09/20  Adverse effects include but are not limited to: peripheral edema, GI upset, hypertension, hot flashes, fatigue, and arthralgias.     Prednisone prescription has been sent to [facility] on 04[facility]07/20. Will ship this to patient's home with the [place]. Patient will obtain prednisone and knows to start prednisone on the same day as Zytiga start.  Reviewed with patient importance of keeping a medication schedule and plan for any missed doses. No barriers to medication adherence identified.  Medication reconciliation performed and medication/allergy list updated.  Insurance authorization for Fabio Asa has been obtained. Test claim at the pharmacy revealed copayment $3 for 1st fill of Zytiga. This will ship from the [facility] on 04/07/20 to deliver to patient's home on 04/08/20.  Patient informed the pharmacy will reach out 5-7 days prior to needing next fill of Zytiga to coordinate continued medication acquisition to prevent break in therapy.  All questions answered.  Mr. Dearmas voiced understanding and appreciation.   Medication education handout placed in mail for patient. Patient knows to  call the office with questions or concerns. Oral Chemotherapy Clinic phone number provided to patient.   Leron Croak, PharmD, BCPS Hematology/Oncology Clinical Pharmacist [facility] [phone] 04/07/2020 1:51 PM

## 2020-04-11 ENCOUNTER — Telehealth: Payer: Self-pay | Admitting: *Deleted

## 2020-04-11 NOTE — Telephone Encounter (Signed)
[name] patient regarding lab results from last week. Spoke with patient and informed him that his PSA is down to 0.7 and his testosterone is at goal level.  Pt voiced understanding and appreciation of the good news.  Also asked pt if he has started the [place]. He states he started this morning along with the prednisone 5 mg. He relayed how he is taking the medications. He is taking them correctly. He is aware of his upcoming appts. He voices understanding to call with any questions or concerns

## 2020-04-11 NOTE — Telephone Encounter (Signed)
-----   Message from Orson Slick, MD sent at 04/07/2020 11:13 AM EDT ----- Please let [name] [name] know that his PSA is at 0.7 (down considerably) and that his testosterone is at goal. Someone should be reaching out to him soon about starting abiraterone Fabio Asa). ----- Message ----- From: Buel Ream, Lab In [place] Sent: 04/06/2020   9:04 AM EDT To: Orson Slick, MD

## 2020-04-18 ENCOUNTER — Telehealth: Payer: Self-pay | Admitting: *Deleted

## 2020-04-18 ENCOUNTER — Telehealth: Payer: Self-pay | Admitting: Hematology and Oncology

## 2020-04-18 DIAGNOSIS — C61 Malignant neoplasm of prostate: Secondary | ICD-10-CM

## 2020-04-18 NOTE — Telephone Encounter (Signed)
Received call from patient. He states he started his Zytiga approx 1 week ago and has started feeling more depressed than usual and more fatigued. He states he has suffered with depression most of his life and has been able to handle it ok but he states the depression is getting worse. He denies any suicidal ideations. He is open to medications for his depression. He has never taken anything for this in the past. He is requesting to stop the Zytiga until he can discuss these issues with him.  Advised that I would schedule an appt with [name] as soon as possible. Pt is scheduled for CT scan on 04/22/20. Pt states he is aware of this and will be there. He has the oral contrast. He would prefer an appt next week after the scan is done.  Scheduling message sent for MD appt.  Social work referral sent for his depression.

## 2020-04-18 NOTE — Telephone Encounter (Signed)
Scheduled appointment per 10/25 sch msg. Spoke to patient who is aware of appointment date and time.

## 2020-04-19 ENCOUNTER — Encounter: Payer: Self-pay | Admitting: *Deleted

## 2020-04-19 NOTE — Progress Notes (Signed)
[name] [name]  Clinical Social [name] received referral from Therapist, sports for depression and counseling resources.  CW contacted patient at home to offer support and assess for needs.  CSW left patient a message offing support and counseling resources.  CSW encouraged patient to return call if he would like CSW to make a referral to [facility] counseling intern.    [name], MSW, LCSW, OSW-C Clinical Social Worker [facility] [phone]

## 2020-04-22 ENCOUNTER — Ambulatory Visit (HOSPITAL_COMMUNITY)
Admission: RE | Admit: 2020-04-22 | Discharge: 2020-04-22 | Disposition: A | Discharge status: Home / Self Care | Payer: Medicaid Other | Source: Ambulatory Visit | Attending: Hematology and Oncology | Admitting: Hematology and Oncology

## 2020-04-22 ENCOUNTER — Other Ambulatory Visit: Payer: Self-pay

## 2020-04-22 DIAGNOSIS — C61 Malignant neoplasm of prostate: Secondary | ICD-10-CM | POA: Diagnosis not present

## 2020-04-22 MED ORDER — IOHEXOL 300 MG/ML  SOLN
100.0000 mL | Freq: Once | INTRAMUSCULAR | Status: AC | PRN
  Administered 2020-04-22: 100 mL via INTRAVENOUS

## 2020-04-25 ENCOUNTER — Telehealth: Payer: Self-pay | Admitting: *Deleted

## 2020-04-25 NOTE — Telephone Encounter (Signed)
-----   Message from Orson Slick, MD sent at 04/24/2020 11:57 AM EDT ----- Please let Mr. Hatfiled know that his CT scan shows excellent response to therapy. All areas of disease (bone, lymph nodes, and prostate) all appear to be responding to therapy. He cancelled his visit last week, please assure we have f/u for him in the near future.  ----- Message ----- From: Interface, Rad Results In Sent: 04/22/2020   5:05 PM EDT To: Orson Slick, MD

## 2020-04-25 NOTE — Telephone Encounter (Signed)
[name] patient regarding scan results. Spoke with patient and informed him that the scans show a good response to his treatment. Pt voiced understanding. He is aware of his appt with [name] on 04/28/20. Pt states he stopped the Zytiga as he became quite depressed while on it. He states he depression was better once he stopped taking it.

## 2020-04-26 ENCOUNTER — Other Ambulatory Visit: Payer: Self-pay | Admitting: Cardiovascular Disease

## 2020-04-26 ENCOUNTER — Other Ambulatory Visit: Payer: Self-pay

## 2020-04-26 ENCOUNTER — Encounter: Payer: Self-pay | Admitting: Cardiovascular Disease

## 2020-04-26 ENCOUNTER — Ambulatory Visit (INDEPENDENT_AMBULATORY_CARE_PROVIDER_SITE_OTHER): Payer: Medicaid Other | Admitting: Cardiovascular Disease

## 2020-04-26 VITALS — BP 128/70 | HR 78 | Ht 74.0 in | Wt 175.0 lb

## 2020-04-26 DIAGNOSIS — F17200 Nicotine dependence, unspecified, uncomplicated: Secondary | ICD-10-CM

## 2020-04-26 DIAGNOSIS — E785 Hyperlipidemia, unspecified: Secondary | ICD-10-CM | POA: Diagnosis not present

## 2020-04-26 DIAGNOSIS — I10 Essential (primary) hypertension: Secondary | ICD-10-CM

## 2020-04-26 DIAGNOSIS — I2510 Atherosclerotic heart disease of native coronary artery without angina pectoris: Secondary | ICD-10-CM | POA: Diagnosis not present

## 2020-04-26 DIAGNOSIS — Z9861 Coronary angioplasty status: Secondary | ICD-10-CM

## 2020-04-26 MED ORDER — ATORVASTATIN CALCIUM 40 MG PO TABS: 40.0000 mg | ORAL_TABLET | Freq: Every day | ORAL | 3 refills | Status: DC

## 2020-04-26 MED FILL — ATORVASTATIN 40 MG TABLET: 40 | 90 days supply | Qty: 90 | Fill #0

## 2020-04-26 NOTE — Assessment & Plan Note (Signed)
History of essential hypertension a blood pressure measured today 128/70.  He is on lisinopril.

## 2020-04-26 NOTE — Patient Instructions (Signed)
Medication Instructions:  START atorvastatin (Lipitor) 40 mg daily  *If you need a refill on your cardiac medications before your next appointment, please call your pharmacy*   Lab Work: Please return for FASTING labs in 2 months (Lipid, Hepatic)  Our in office lab hours are Monday-Friday 8:00-4:00, closed for lunch 12:45-1:45 pm.  No appointment needed.  Follow-Up: At [facility], you and your health needs are our priority.  As part of our continuing mission to provide you with exceptional heart care, we have created designated Provider Care Teams.  These Care Teams include your primary Cardiologist (physician) and Advanced Practice Providers (APPs -  Physician Assistants and Nurse Practitioners) who all work together to provide you with the care you need, when you need it.  We recommend signing up for the patient portal called "MyChart".  Sign up information is provided on this After Visit Summary.  MyChart is used to connect with patients for Virtual Visits (Telemedicine).  Patients are able to view lab/test results, encounter notes, upcoming appointments, etc.  Non-urgent messages can be sent to your provider as well.   To learn more about what you can do with MyChart, go to [email or web address].    Your next appointment:   6 month(s)  The format for your next appointment:   In Person  Provider:   You will see one of the following Advanced Practice Providers on your designated Care Team:    Kerin Ransom, PA-C  [place], [place]  Coletta Memos, [place]  Then, Quay Burow, MD will plan to see you again in 12 month(s).

## 2020-04-26 NOTE — Assessment & Plan Note (Signed)
History of ongoing tobacco abuse of 5 to 6 cigarettes a day recalcitrant risk factor modification.

## 2020-04-26 NOTE — Assessment & Plan Note (Signed)
History of hyperlipidemia currently not on statin therapy with lipid profile performed 12/23/2019 revealing total cholesterol of 244, LDL of 175 and HDL of 42.  I am going to start him on atorvastatin 40 mg a day and we will recheck a lipid liver profile on him in 2 months

## 2020-04-26 NOTE — Progress Notes (Signed)
04/26/2020 [name] [name]   21-Jul-1955  [id number]  Primary Physician [facility] Primary Cardiologist: [name] Harp MD [name] [name], [place]  HPI:  [name] [name] is a 64 y.o.  thin appearing single African-American male father of 2 children, grandfather 3 grandchildren who currently does not work. I last saw him in the office 02/16/2020.His risk factors include 45-pack-year tobacco abuse currently smoking 4 to 5 cigarettes a day down from a pack a day 6 months ago. History of hypertension hyperlipidemia. He had a stent implanted in his LAD 04/05/2010 at [facility] (MultiLink vision). He was admitted to [facility] 11/11/2017 with chest pain and non-STEMI and underwent cardiac catheterization by Dr. Ellyn [name] the following day revealing patent LAD stent with high-grade tandem lesions in the circumflex obtuse marginal branch which were stented with overlapping synergy drug-eluting stents. The distal lesion in the AV groove was left untreated. He had normal LV function. Her last several months he is noted increasing dyspnea on exertion but denies chest pain.  A 2D echo was performed 02/01/2020 that was essentially normal.    I was going to get a Myoview stress test but this was never performed however in the interim the shortness of breath which she was complaining of has completely resolved.  He currently denies chest pain.  Unfortunately, he never started his statin drug with lipid profile performed 12/23/2019 revealed a total cholesterol of 244, LDL 175 and HDL 42..    Current Meds  Medication Sig  . aspirin 81 MG EC tablet Take 1 tablet (81 mg total) by mouth daily.  . calcium-vitamin D (OSCAL WITH D) 500-200 MG-UNIT tablet Take 1 tablet by mouth daily with breakfast.  . clopidogrel (PLAVIX) 75 MG tablet Take 1 tablet (75 mg total) by mouth daily.  Marland Kitchen gabapentin (NEURONTIN) 300 MG capsule Take 300 mg by mouth 3 (three) times daily as needed for pain.   Marland Kitchen lisinopril (ZESTRIL) 10 MG tablet Take 1 tablet (10 mg total) by mouth daily.  . nitroGLYCERIN (NITROSTAT) 0.4 MG SL tablet Place 1 tablet (0.4 mg total) under the tongue every 5 (five) minutes as needed for chest pain.  Marland Kitchen oxyCODONE 10 MG TABS Take 0.5 tablets (5 mg total) by mouth every 4 (four) hours as needed for severe pain.  . predniSONE (DELTASONE) 5 MG tablet Take 1 tablet (5 mg total) by mouth daily with breakfast.  . [DISCONTINUED] abiraterone acetate (ZYTIGA) 250 MG tablet Take 4 tablets (1,000 mg total) by mouth daily. Take on an empty stomach 1 hour before or 2 hours after a meal     No Known Allergies  Social History   Socioeconomic History  . Marital status: Single    Spouse name: Not on file  . Number of children: Not on file  . Years of education: Not on file  . Highest education level: Not on file  Occupational History  . Occupation: unemployed  Tobacco Use  . Smoking status: Current Every Day Smoker    Packs/day: 0.25    Years: 45.00    Pack years: 11.25  . Smokeless tobacco: Never Used  Vaping Use  . Vaping Use: Never used  Substance and Sexual Activity  . Alcohol use: Yes    Alcohol/week: 75.0 standard drinks    Types: 75 Shots of liquor per week    Comment: 2 shots of liquor daily  . Drug use: Not Currently    Types: Marijuana    Comment: occasionally  .  Sexual activity: Yes  Other Topics Concern  . Not on file  Social History Narrative   From [place]    Family lives in [place] (brother, sister and mother)    Unemployed    Drinks 3 (1/5)ths in 1 week   Social Determinants of Health   Financial Resource Strain: Low Risk   . Difficulty of Paying Living Expenses: Not very hard  Food Insecurity: No Food Insecurity  . Worried About Charity fundraiser in the Last Year: Never true  . Ran Out of Food in the Last Year: Never true  Transportation Needs: No Transportation Needs  . Lack of Transportation (Medical): No  . Lack of Transportation  (Non-Medical): No  Physical Activity: Inactive  . Days of Exercise per Week: 0 days  . Minutes of Exercise per Session: 0 min  Stress: Stress Concern Present  . Feeling of Stress : To some extent  Social Connections: Socially Isolated  . Frequency of Communication with Friends and Family: Three times a week  . Frequency of Social Gatherings with Friends and Family: Three times a week  . Attends Religious Services: Never  . Active Member of Clubs or Organizations: No  . Attends Archivist Meetings: Never  . Marital Status: Never married  Intimate Partner Violence:   . Fear of Current or Ex-Partner: Not on file  . Emotionally Abused: Not on file  . Physically Abused: Not on file  . Sexually Abused: Not on file     Review of Systems: General: negative for chills, fever, night sweats or weight changes.  Cardiovascular: negative for chest pain, dyspnea on exertion, edema, orthopnea, palpitations, paroxysmal nocturnal dyspnea or shortness of breath Dermatological: negative for rash Respiratory: negative for cough or wheezing Urologic: negative for hematuria Abdominal: negative for nausea, vomiting, diarrhea, bright red blood per rectum, melena, or hematemesis Neurologic: negative for visual changes, syncope, or dizziness All other systems reviewed and are otherwise negative except as noted above.    Blood pressure 128/70, pulse 78, height 6\' 2"  (1.88 m), weight 175 lb (79.4 kg).  General appearance: alert and no distress Neck: no adenopathy, no carotid bruit, no JVD, supple, symmetrical, trachea midline and thyroid not enlarged, symmetric, no tenderness/mass/nodules Lungs: clear to auscultation bilaterally Heart: regular rate and rhythm, S1, S2 normal, no murmur, click, rub or gallop Extremities: extremities normal, atraumatic, no cyanosis or edema Pulses: 2+ and symmetric Skin: Skin color, texture, turgor normal. No rashes or lesions Neurologic: Alert and oriented X 3,  normal strength and tone. Normal symmetric reflexes. Normal coordination and gait  EKG not performed today  ASSESSMENT AND PLAN:   Essential hypertension History of essential hypertension a blood pressure measured today 128/70.  He is on lisinopril.  Current smoker History of ongoing tobacco abuse of 5 to 6 cigarettes a day recalcitrant risk factor modification.  CAD S/P percutaneous coronary angioplasty History of CAD status post LAD stenting at [facility] in [place] 04/05/2010 with a MultiLink vision bare-metal stent.  He underwent cardiac catheterization by Dr. Ellyn [name] after being admitted with a non-STEMI 11/11/2017 revealing patent LAD stent with high-grade tandem lesions in the circumflex obtuse marginal branch which were stented with overlapping synergy drug-eluting stents.  He does have a distal lesion in the AV groove circumflex which was left untreated.  He remains on dual antiplatelet therapy and denies chest pain or shortness of breath.  Hyperlipidemia LDL goal <70 History of hyperlipidemia currently not on statin therapy with lipid profile  performed 12/23/2019 revealing total cholesterol of 244, LDL of 175 and HDL of 42.  I am going to start him on atorvastatin 40 mg a day and we will recheck a lipid liver profile on him in 2 months      [name] Harp MD [facility], [facility] 04/26/2020 3:29 PM

## 2020-04-26 NOTE — Assessment & Plan Note (Signed)
History of CAD status post LAD stenting at [facility] in [place] 04/05/2010 with a MultiLink vision bare-metal stent.  He underwent cardiac catheterization by Dr. Ellyn Hack after being admitted with a non-STEMI 11/11/2017 revealing patent LAD stent with high-grade tandem lesions in the circumflex obtuse marginal branch which were stented with overlapping synergy drug-eluting stents.  He does have a distal lesion in the AV groove circumflex which was left untreated.  He remains on dual antiplatelet therapy and denies chest pain or shortness of breath.

## 2020-04-27 ENCOUNTER — Other Ambulatory Visit: Payer: Self-pay | Admitting: *Deleted

## 2020-04-27 DIAGNOSIS — C61 Malignant neoplasm of prostate: Secondary | ICD-10-CM

## 2020-04-28 ENCOUNTER — Inpatient Hospital Stay: Payer: Medicaid Other | Attending: Hematology and Oncology

## 2020-04-28 ENCOUNTER — Other Ambulatory Visit: Payer: Self-pay

## 2020-04-28 ENCOUNTER — Inpatient Hospital Stay (HOSPITAL_BASED_OUTPATIENT_CLINIC_OR_DEPARTMENT_OTHER): Payer: Medicaid Other | Admitting: Hematology and Oncology

## 2020-04-28 ENCOUNTER — Encounter: Payer: Self-pay | Admitting: General Practice

## 2020-04-28 ENCOUNTER — Other Ambulatory Visit: Payer: Self-pay | Admitting: Hematology and Oncology

## 2020-04-28 VITALS — BP 148/67 | HR 96 | Temp 97.5°F | Resp 18 | Ht 73.0 in | Wt 176.8 lb

## 2020-04-28 DIAGNOSIS — Z7289 Other problems related to lifestyle: Secondary | ICD-10-CM | POA: Diagnosis not present

## 2020-04-28 DIAGNOSIS — F419 Anxiety disorder, unspecified: Secondary | ICD-10-CM | POA: Diagnosis not present

## 2020-04-28 DIAGNOSIS — I252 Old myocardial infarction: Secondary | ICD-10-CM | POA: Diagnosis not present

## 2020-04-28 DIAGNOSIS — F17210 Nicotine dependence, cigarettes, uncomplicated: Secondary | ICD-10-CM | POA: Insufficient documentation

## 2020-04-28 DIAGNOSIS — C61 Malignant neoplasm of prostate: Secondary | ICD-10-CM

## 2020-04-28 DIAGNOSIS — F329 Major depressive disorder, single episode, unspecified: Secondary | ICD-10-CM | POA: Insufficient documentation

## 2020-04-28 DIAGNOSIS — C7951 Secondary malignant neoplasm of bone: Secondary | ICD-10-CM | POA: Diagnosis not present

## 2020-04-28 LAB — CBC WITH DIFFERENTIAL (CANCER CENTER ONLY)
Abs Immature Granulocytes: 0.02 10*3/uL (ref 0.00–0.07)
Basophils Absolute: 0 10*3/uL (ref 0.0–0.1)
Basophils Relative: 1 %
Eosinophils Absolute: 0.1 10*3/uL (ref 0.0–0.5)
Eosinophils Relative: 2 %
HCT: 37.2 % — ABNORMAL LOW (ref 39.0–52.0)
Hemoglobin: 11.9 g/dL — ABNORMAL LOW (ref 13.0–17.0)
Immature Granulocytes: 0 %
Lymphocytes Relative: 30 %
Lymphs Abs: 2 10*3/uL (ref 0.7–4.0)
MCH: 26.4 pg (ref 26.0–34.0)
MCHC: 32 g/dL (ref 30.0–36.0)
MCV: 82.7 fL (ref 80.0–100.0)
Monocytes Absolute: 0.5 10*3/uL (ref 0.1–1.0)
Monocytes Relative: 7 %
Neutro Abs: 4 10*3/uL (ref 1.7–7.7)
Neutrophils Relative %: 60 %
Platelet Count: 190 10*3/uL (ref 150–400)
RBC: 4.5 MIL/uL (ref 4.22–5.81)
RDW: 15.2 % (ref 11.5–15.5)
WBC Count: 6.6 10*3/uL (ref 4.0–10.5)
nRBC: 0 % (ref 0.0–0.2)

## 2020-04-28 LAB — CMP (CANCER CENTER ONLY)
ALT: 23 U/L (ref 0–44)
AST: 18 U/L (ref 15–41)
Albumin: 4 g/dL (ref 3.5–5.0)
Alkaline Phosphatase: 68 U/L (ref 38–126)
Anion gap: 9 (ref 5–15)
BUN: 19 mg/dL (ref 8–23)
CO2: 26 mmol/L (ref 22–32)
Calcium: 9.8 mg/dL (ref 8.9–10.3)
Chloride: 104 mmol/L (ref 98–111)
Creatinine: 1.4 mg/dL — ABNORMAL HIGH (ref 0.61–1.24)
GFR, Estimated: 57 mL/min — ABNORMAL LOW (ref >60)
Glucose, Bld: 116 mg/dL — ABNORMAL HIGH (ref 70–99)
Potassium: 4.2 mmol/L (ref 3.5–5.1)
Sodium: 139 mmol/L (ref 135–145)
Total Bilirubin: <0.2 mg/dL — ABNORMAL LOW (ref 0.3–1.2)
Total Protein: 8 g/dL (ref 6.5–8.1)

## 2020-04-28 NOTE — Progress Notes (Signed)
[name] [name] [phone]   [phone]  PROGRESS NOTE  Patient Care Team: Rosilyn Mings. as PCP - General Lorretta Harp, MD as PCP - Cardiology (Cardiology)  Hematological/Oncological History # Metastatic Castrate Sensitive Prostate Cancer 1) 10/13/2019: CXR in the ED for shortness of breath showed masslike soft tissue fullness within the right hilum concerning for right hilar lymphadenopathy 2) 10/13/2019: CT Chest W contrast showed mediastinal and bilateral hilar adenopathy, right greater than left. Seen by pulmonary with plans for PET and biopsy, though he was lost to follow up.  3) 01/17/2020: sent to the ED by Orthopedic surgery due to metastasis of the spine noted on MRI. IN the ED CT C/A/P showed extensive left pelvic and retroperitoneal lymphadenopathy. Mediastinal and bilateral hilar lymphadenopathy as well as mixed lytic and sclerotic lesions are noted in the bony pelvis 4) 01/17/2020: PSA noted to be 1074.94 5) 01/22/2020: establish care with [name]. Started Bicalutamide 50mg  PO daily. Plan for 28 days of therapy.   6) 02/05/2020: Lupron 22.5mg  started.  7) 03/03/2020: received 45mg  lupron with urology. PSA 9.6 8) 03/30/2020: prostate biopsy performed, confirmed adenocarcinoma of the prostate.  9) 04/25/2020: stopped Zytiga therapy after < 7 days due to worsening depression 10) 04/28/2020: start enzalutamide 160mg  PO daily  Interval History:  [name] [name] 64 y.o. male with medical history significant for metastatic prostate cancer who presents for a follow up visit. The patient's last visit was on 04/06/2020. In the interim since the last visit [name] has self d/c abiraterone due to worsening depression.   On exam today [name] notes he has been having difficulty with stress and anxiety which he believes has been exacerbated by the presence of Zytiga.  He notes that he has a baseline level of anxiety which is markedly worsened.  He  notes that he only took this medication for less than 7 days.  He was previously seen at [facility] for his issues with depression.  He is not currently taken into present but will be open to trying one today.  In terms of his medication he notes he is not having any issues with fevers, chills, sweats, nausea, vomiting or diarrhea.  He has not noticed a decrease in his energy and he otherwise feels well.  Full 10 point ROS is listed below.  MEDICAL HISTORY:  Past Medical History:  Diagnosis Date  . Brain tumor [facility])    diagnosed by MRI 6 months ago  . Cancer ([place])    Pt reports brain tumor 2 years ago  . Coronary artery disease 2011   s/p BMS stent LAD, [facility], [facility] [place], [place], Dr Chauncey Cruel. [name]  . Depression   . Dyspnea   . High cholesterol   . History of herniated intervertebral disc   . HTN (hypertension)   . Hyperlipidemia LDL goal <70 07/21/2013  . Lung nodule   . NSTEMI (non-ST elevated myocardial infarction) ([place]) 11/11/2017  . Smoker    1/2 ppd x >40 years     SURGICAL HISTORY: Past Surgical History:  Procedure Laterality Date  . CORONARY ANGIOPLASTY WITH STENT PLACEMENT Left 2011   BMS LAD, [facility], [facility] [place], [place], Dr Chauncey Cruel. [name]  . CORONARY STENT INTERVENTION N/A 11/12/2017   Procedure: CORONARY STENT INTERVENTION;  Surgeon: Leonie Man, MD;  Location: [place] [facility];  Service: Cardiovascular;  Laterality: N/A;  . LEFT HEART CATH AND CORONARY ANGIOGRAPHY N/A 11/12/2017   Procedure: LEFT HEART CATH AND CORONARY  ANGIOGRAPHY;  Surgeon: Leonie Man, MD;  Location: [place] [facility];  Service: Cardiovascular;  Laterality: N/A;    SOCIAL HISTORY: Social History   Socioeconomic History  . Marital status: Single    Spouse name: Not on file  . Number of children: Not on file  . Years of education: Not on file  . Highest education level: Not on file  Occupational History  . Occupation: unemployed  Tobacco Use  . Smoking status: Current  Every Day Smoker    Packs/day: 0.25    Years: 45.00    Pack years: 11.25  . Smokeless tobacco: Never Used  Vaping Use  . Vaping Use: Never used  Substance and Sexual Activity  . Alcohol use: Yes    Alcohol/week: 75.0 standard drinks    Types: 75 Shots of liquor per week    Comment: 2 shots of liquor daily  . Drug use: Not Currently    Types: Marijuana    Comment: occasionally  . Sexual activity: Yes  Other Topics Concern  . Not on file  Social History Narrative   From [place]    Family lives in [place] (brother, sister and mother)    Unemployed    Drinks 3 (1/5)ths in 1 week   Social Determinants of Health   Financial Resource Strain: Low Risk   . Difficulty of Paying Living Expenses: Not very hard  Food Insecurity: No Food Insecurity  . Worried About Charity fundraiser in the Last Year: Never true  . Ran Out of Food in the Last Year: Never true  Transportation Needs: No Transportation Needs  . Lack of Transportation (Medical): No  . Lack of Transportation (Non-Medical): No  Physical Activity: Inactive  . Days of Exercise per Week: 0 days  . Minutes of Exercise per Session: 0 min  Stress: Stress Concern Present  . Feeling of Stress : To some extent  Social Connections: Socially Isolated  . Frequency of Communication with Friends and Family: Three times a week  . Frequency of Social Gatherings with Friends and Family: Three times a week  . Attends Religious Services: Never  . Active Member of Clubs or Organizations: No  . Attends Archivist Meetings: Never  . Marital Status: Never married  Intimate Partner Violence:   . Fear of Current or Ex-Partner: Not on file  . Emotionally Abused: Not on file  . Physically Abused: Not on file  . Sexually Abused: Not on file    FAMILY HISTORY: Family History  Problem Relation Age of Onset  . Diabetes Paternal Grandfather   . Hypertension Paternal Grandfather     ALLERGIES:  has No Known  Allergies.  MEDICATIONS:  Current Outpatient Medications  Medication Sig Dispense Refill  . aspirin 81 MG EC tablet Take 1 tablet (81 mg total) by mouth daily. 90 tablet 3  . atorvastatin (LIPITOR) 40 MG tablet Take 1 tablet (40 mg total) by mouth daily. 90 tablet 3  . calcium-vitamin D (OSCAL WITH D) 500-200 MG-UNIT tablet Take 1 tablet by mouth daily with breakfast. 90 tablet 3  . carvedilol (COREG) 6.25 MG tablet carvedilol 6.25 mg tablet  TAKE 1 TABLET BY MOUTH TWICE DAILY WITH A MEAL    . clopidogrel (PLAVIX) 75 MG tablet Take 1 tablet (75 mg total) by mouth daily. 90 tablet 3  . gabapentin (NEURONTIN) 300 MG capsule Take 300 mg by mouth 3 (three) times daily as needed for pain.    Marland Kitchen lisinopril (ZESTRIL)  10 MG tablet Take 1 tablet (10 mg total) by mouth daily. 90 tablet 3  . nitroGLYCERIN (NITROSTAT) 0.4 MG SL tablet Place 1 tablet (0.4 mg total) under the tongue every 5 (five) minutes as needed for chest pain. 25 tablet 1  . oxyCODONE 10 MG TABS Take 0.5 tablets (5 mg total) by mouth every 4 (four) hours as needed for severe pain. (Patient not taking: Reported on 04/28/2020) 30 tablet 0  . predniSONE (DELTASONE) 5 MG tablet Take 1 tablet (5 mg total) by mouth daily with breakfast. 90 tablet 1   No current facility-administered medications for this visit.    REVIEW OF SYSTEMS:   Constitutional: ( - ) fevers, ( - )  chills , ( - ) night sweats Eyes: ( - ) blurriness of vision, ( - ) double vision, ( - ) watery eyes Ears, nose, mouth, throat, and face: ( - ) mucositis, ( - ) sore throat Respiratory: ( - ) cough, ( - ) dyspnea, ( - ) wheezes Cardiovascular: ( - ) palpitation, ( - ) chest discomfort, ( - ) lower extremity swelling Gastrointestinal:  ( - ) nausea, ( - ) heartburn, ( - ) change in bowel habits Skin: ( - ) abnormal skin rashes Lymphatics: ( - ) new lymphadenopathy, ( - ) easy bruising Neurological: ( - ) numbness, ( - ) tingling, ( - ) new weaknesses Behavioral/Psych: ( -  ) mood change, ( - ) new changes  All other systems were reviewed with the patient and are negative.  PHYSICAL EXAMINATION: ECOG PERFORMANCE STATUS: 1 - Symptomatic but completely ambulatory  Vitals:   04/28/20 1046  BP: (!) 148/67  Pulse: 96  Resp: 18  Temp: (!) 97.5 F (36.4 C)  SpO2: 100%   Filed Weights   04/28/20 1046  Weight: 176 lb 12.8 oz (80.2 kg)    GENERAL:  Well appearing middle aged [place] American male. alert, no distress and comfortable SKIN: skin color, texture, turgor are normal, no rashes or significant lesions EYES: conjunctiva are pink and non-injected, sclera clear LUNGS: clear to auscultation and percussion with normal breathing effort HEART: regular rate & rhythm and no murmurs and no lower extremity edema Musculoskeletal: no cyanosis of digits and no clubbing  PSYCH: alert & oriented x 3, fluent speech NEURO: no focal motor/sensory deficits  LABORATORY DATA:  I have reviewed the data as listed CBC Latest Ref Rng & Units 04/06/2020 02/19/2020 01/17/2020  WBC 4.0 - 10.5 K/uL 6.3 6.4 6.4  Hemoglobin 13.0 - 17.0 g/dL 11.7(L) 10.8(L) 10.5(L)  Hematocrit 39 - 52 % 37.4(L) 35.3(L) 34.6(L)  Platelets 150 - 400 K/uL 189 186 259    CMP Latest Ref Rng & Units 04/28/2020 04/06/2020 02/19/2020  Glucose 70 - 99 mg/dL 116(H) 95 99  BUN 8 - 23 mg/dL 19 15 22   Creatinine 0.61 - 1.24 mg/dL 1.40(H) 1.47(H) 1.60(H)  Sodium 135 - 145 mmol/L 139 140 139  Potassium 3.5 - 5.1 mmol/L 4.2 4.0 4.1  Chloride 98 - 111 mmol/L 104 106 106  CO2 22 - 32 mmol/L 26 29 25   Calcium 8.9 - 10.3 mg/dL 9.8 10.2 9.9  Total Protein 6.5 - 8.1 g/dL 8.0 8.2(H) 7.8  Total Bilirubin 0.3 - 1.2 mg/dL <0.2(L) 0.3 0.3  Alkaline Phos 38 - 126 U/L 68 79 255(H)  AST 15 - 41 U/L 18 20 16   ALT 0 - 44 U/L 23 19 12    RADIOGRAPHIC STUDIES: CT CHEST ABDOMEN PELVIS W CONTRAST  Result  Date: 04/22/2020 CLINICAL DATA:  Prostate cancer.  Restaging. EXAM: CT CHEST, ABDOMEN, AND PELVIS WITH CONTRAST  TECHNIQUE: Multidetector CT imaging of the chest, abdomen and pelvis was performed following the standard protocol during bolus administration of intravenous contrast. CONTRAST:  166mL OMNIPAQUE IOHEXOL 300 MG/ML  SOLN COMPARISON:  01/17/2020 FINDINGS: CT CHEST FINDINGS Cardiovascular: The heart size appears within normal limits. Aortic atherosclerosis and coronary artery calcifications identified. Mediastinum/Nodes: Normal appearance of the thyroid gland. The trachea appears patent and is midline. Normal appearance of the esophagus. Interval improvement in mediastinal and hilar adenopathy. -right paratracheal node measures 0.7 cm, image 29/2. Previously 1.7 cm. -Right hilar lymph node measures 1.1 cm, image 32/2. Previously 2.8 cm. -Previous subcarinal lymph node has resolved in the interval. No new or progressive adenopathy identified within the chest. Lungs/Pleura: No pleural effusion identified. No airspace consolidation, atelectasis, or pneumothorax identified. Pulmonary nodule within the right lower lobe measures 0.5 cm, image 95/4. Previously 1 cm. Perifissural nodule in the right middle lobe measures 4 mm, image 105/4. Stable from previous exam. There are no new suspicious lung nodules. Musculoskeletal: Interval development of multifocal areas of abnormal sclerosis within the thoracic spine and sternal manubrium. Small lesions are noted at T4, T5, T7, T8 and T11 as well as T12. These are all new from previous exam and given the positive treatment response noted within the mediastinum are favored to represent areas of treated metastasis. CT ABDOMEN PELVIS FINDINGS Hepatobiliary: Scattered liver cysts are identified. These appears similar in size and distribution to the previous study. No new suspicious liver lesions. The gallbladder is unremarkable. No biliary dilatation. Pancreas: Unremarkable. No pancreatic ductal dilatation or surrounding inflammatory changes. Spleen: Normal in size without focal  abnormality. Adrenals/Urinary Tract: Normal appearance of the adrenal glands. The right kidney is unremarkable. There is been interval resolution of previous left hydronephrosis. Urinary bladder unremarkable. Stomach/Bowel: The stomach is nondilated. No bowel wall thickening, inflammation, or distension. The appendix is visualized and appears normal. Vascular/Lymphatic: Aortic atherosclerosis.  No aneurysm. Interval improvement in abdominopelvic adenopathy. Index left periaortic lymph node measures 0.9 cm, image 77/2. This is compared with 2.8 cm previously. Left common iliac lymph node measures 1.2 cm, image 94/2. Previously 3.3 cm. Large left pelvic sidewall lymph node measures 1 cm, image 111/2. This is compared with 3.3 cm previously. Left posterior pelvic sidewall lymph node measures 1.1 cm, image 111/2. Previously 3.0 cm. Reproductive: Prostate gland measures 4.4 x 5.6 by 6.0 cm with a volume of 74 cc. This is compared with 102 cc previously. Other: No free fluid or fluid collections identified. Musculoskeletal: Interval increase in the appearance of multifocal areas of sclerosis within the bony pelvis and lumbar spine. Findings are favored to represent areas of treated bone metastases. IMPRESSION: 1. Significant interval improvement in mediastinal, hilar, abdominal and pelvic adenopathy. 2. Interval increase in the appearance of multifocal areas of sclerosis within the axial skeleton which are favored to represent areas of treated bone metastases. 3. Decrease in size of right lower lobe lung nodule. 4. Interval resolution of previous left hydronephrosis, reflecting interval improvement in left retroperitoneal and left pelvic adenopathy 5. Interval decrease in size of prostate gland. 6. Aortic atherosclerosis and coronary artery calcifications. Aortic Atherosclerosis (ICD10-I70.0). Electronically Signed   By: Kerby Moors M.D.   On: 04/22/2020 17:03    ASSESSMENT & PLAN [name] [name] 64 y.o. male with  medical history significant for metastatic prostate cancer who presents for a follow up visit.    Mr. Pletz today  notes that the abiraterone therapy has worsened his depression and therefore he would like to change medications.  Otherwise he is not having any side effects from the androgen deprivation therapy.  Today we discussed the treatment options moving forward including d/c of abiraterone and the addition of enzalutamide to his ADT therapy.  I discussed the side effects of both of these medications and the required follow-up.  After weighing the pros and cons the patient wanted to proceed with the enzalutamide therapy and stop taking the abiraterone therapy.   The prostate biopsy has been obtained and confirms adenocarcinoma of the prostate. We can began treating with abiraterone 1000 mg p.o. daily with 5 mg of prednisone, however he d/c this due to worsening anxiety/depression. He will continue his q. 3 monthly 22.5 mg Lupron shots (most recent shot was a 45mg  on 03/03/2020, next due March 2022).  Additionally testosterone and PSA levels will be rechecked q 3 months. There is no clear indication for bisphosphonate therapy in the setting of castrate sensitive prostate cancer with metastatic disease to the bone.  # Metastatic Castrate Sensitive Prostate Cancer  --repeat CT C/A/P Scan and NM bone scan due Jan 2022. Will repeat q 3 months initially.  --prostate biopsy performed by urology on 03/30/2020, confirms diagnosis of prostate adenocarcinoma. Gleason score not calculated due to ADT therapy  --Received dose of lupron 45mg  on 02/29/2020. Next due in March 2022. --calcium/vitamin D for bone protection on ADT therapy.  --d/c PO abiraterone therapy and plan to start enzalutamide 160mg  PO daily instead --the literature does not currently support the use of bisphosphonates in the setting of CSPC.  --have patient RTC 4 weeks after start of enzalutamide to assess his progress  #Symptom  management -- oxycodone 5mg  q4H PRN for pain. He has not required these as his pain is currently under control --encourage OTC senna for constipation prevention if taking opioids.  #Goals of Care --discussed the incurable nature of metastatic disease --emphasized that all treatments moving forward are focused on symptom control, decreasing tumor size/slow spread, and increasing lifespan.  --patient voiced understanding of the palliative nature of treatments moving forward.  --patients MPOA is Chanel Sarina Ill (his daughter)   No orders of the defined types were placed in this encounter.   All questions were answered. The patient knows to call the clinic with any problems, questions or concerns.  A total of more than 30 minutes were spent on this encounter and over half of that time was spent on counseling and coordination of care as outlined above.   Ledell Peoples, MD Department of Hematology/Oncology [place] [facility] Phone: [phone] Pager: [phone] Email: [email or web address]  04/28/2020 11:16 AM   Literature Support:  Juanda Crumble A Systematic Review and Meta-Analysis about the Effect of Bisphosphonates on the Risk of Skeletal-Related Event in Men with Prostate Cancer. Anticancer Agents Med Chem. 2020;20(13):1604-1612.  --Our study demonstrated that bisphosphonates could not statistically significantly reduce the risk of SRE in patients with prostate cancer, neither in the subgroups with M1 or CSPC.

## 2020-04-28 NOTE — Progress Notes (Signed)
[place] Psychosocial Distress Screening Clinical Social Work  Clinical Social Work was referred by distress screening protocol.  The patient scored a 9 on the Psychosocial Distress Thermometer which indicates severe distress. Clinical Social Worker contacted patient by phone to assess for distress and other psychosocial needs. "Donnald Garre been like this for years, had some things happen in my childhood."  Has used alcohol to cope with depression.  Drinks a pint/day.  Wants to stop drinking because he thinks it interferes with his cancer treatment.    As his major concern at this time is related to substance abuse treatment, he will be referred to [facility] for a substance abuse assessment and treatment plan.  Will call patient in 2 weeks to assess progress.   ONCBCN DISTRESS SCREENING 04/28/2020  Distress experienced in past week (1-10) 9  Emotional problem type Depression;Nervousness/Anxiety;Isolation/feeling alone  Referral to clinical social work Yes    Clinical Social Worker follow up needed: Yes.    If yes, follow up plan:  See above.   Beverely Pace, [place], LCSW Clinical Social Worker Phone:  [phone]

## 2020-04-29 ENCOUNTER — Ambulatory Visit: Payer: Medicaid Other

## 2020-04-29 LAB — TESTOSTERONE: Testosterone: <3 ng/dL — ABNORMAL LOW (ref 264–916)

## 2020-04-29 LAB — PROSTATE-SPECIFIC AG, SERUM (LABCORP): Prostate Specific Ag, Serum: 0.5 ng/mL (ref 0.0–4.0)

## 2020-05-02 ENCOUNTER — Other Ambulatory Visit: Payer: Self-pay | Admitting: Hematology and Oncology

## 2020-05-02 ENCOUNTER — Other Ambulatory Visit: Payer: Self-pay

## 2020-05-02 ENCOUNTER — Inpatient Hospital Stay: Payer: Medicaid Other

## 2020-05-05 ENCOUNTER — Telehealth: Payer: Self-pay | Admitting: Pharmacist

## 2020-05-05 ENCOUNTER — Other Ambulatory Visit: Payer: Self-pay | Admitting: Hematology and Oncology

## 2020-05-05 ENCOUNTER — Telehealth: Payer: Self-pay

## 2020-05-05 ENCOUNTER — Encounter: Payer: Self-pay | Admitting: General Practice

## 2020-05-05 MED ORDER — CITALOPRAM HYDROBROMIDE 20 MG PO TABS: 20.0000 mg | ORAL_TABLET | Freq: Every day | ORAL | 3 refills | Status: DC

## 2020-05-05 MED ORDER — ENZALUTAMIDE 40 MG PO CAPS: 160.0000 mg | ORAL_CAPSULE | Freq: Every day | ORAL | 0 refills | Status: DC

## 2020-05-05 MED FILL — XTANDI 40 MG CAPSULE: 40 | 30 days supply | Qty: 120 | Fill #0

## 2020-05-05 MED FILL — CITALOPRAM HBR 20 MG TABLET: 20 | 30 days supply | Qty: 30 | Fill #0

## 2020-05-05 NOTE — Telephone Encounter (Signed)
Oral Chemotherapy Pharmacist Encounter  I spoke with patient for overview of: Xtandi for the treatment of metastatic, castration-sensitive prostate cancer in conjunction with ADT, planned duration until disease progression or unacceptable toxicity.   Counseled patient on administration, dosing, side effects, monitoring, drug-food interactions, safe handling, storage, and disposal.  Patient will take Xtandi 40mg  capsules, 4 capsules (160mg ) by mouth once daily without regard to food.  Xtandi start date: 05/09/20  Adverse effects include but are not limited to: peripheral edema, GI upset, hypertension, hot flashes, fatigue, falls/fractures, and arthralgias.   Patient instructed about small risk of seizures with Xtandi treatment.  Reviewed with patient importance of keeping a medication schedule and plan for any missed doses. No barriers to medication adherence identified.  Medication reconciliation performed and medication/allergy list updated.  Insurance authorization for Gillermina Phy has been obtained. Test claim at the pharmacy revealed copayment $3 for 1st fill of Xtandi. This will be shipped from from the [facility] on 05/05/20 and delivered to patient's home on 05/06/20.  Patient informed the pharmacy will reach out 5-7 days prior to needing next fill of Xtandi to coordinate continued medication acquisition to prevent break in therapy.  All questions answered.  Mr. Troiano voiced understanding and appreciation.   Medication education handout placed in mail for patient. Patient knows to call the office with questions or concerns. Oral Chemotherapy Clinic phone number provided to patient.   Leron Croak, PharmD, BCPS Hematology/Oncology Clinical Pharmacist [facility] [phone] 05/05/2020 2:46 PM

## 2020-05-05 NOTE — Progress Notes (Signed)
[place] Psychosocial Distress Screening Clinical Social Work  Clinical Social Work was referred by distress screening protocol.  The patient scored a 9 on the Psychosocial Distress Thermometer which indicates severe distress. Clinical Social Worker contacted patient by phone to assess for distress and other psychosocial needs. Was referred to [facility] for help w anxiety/depression, was not "ready" at this time.  States that he stopped drinking and smoking entirely earlier this week.  Aware he needs to start "doing something constructive" vs "sitting around and watching TV all day."    ONCBCN DISTRESS SCREENING 04/28/2020  Distress experienced in past week (1-10) 9  Emotional problem type Depression;Nervousness/Anxiety;Isolation/feeling alone  Referral to clinical social work Yes    Clinical Social Worker follow up needed: Yes.    Will call every two weeks.  If yes, follow up plan:  Beverely Pace, [place], LCSW Clinical Social Worker Phone:  [phone]

## 2020-05-05 NOTE — Telephone Encounter (Signed)
Oral Oncology Pharmacist Encounter  Received new prescription for Xtandi (enzalutamide) for the treatment of metastatic castration-sensitive prostate cancer in conjunction with ADT, planned duration until disease progression or unacceptable drug toxicity.  Prescription dose and frequency assessed for appropriateness. Appropriate for therapy initiation.   CMP and CBC w/ Diff from 04/28/20 assessed, noted Scr of 1.40 mg/dL (CrCl ~57 mL/min) - no dose adjustments required. Labs stable for treatment initiation  Current medication list in Epic reviewed, DDIs with Xtandi identified:  Category C DDI between [place] and Atorvastatin, Citalopram and Oxycodone: Gillermina Phy is a strong CYP3A4 inducer and may decrease serum concentrations of these agents - recommend monitoring for decreased efficacy of atorvastatin, citalopram and oxycodone while on Xtandi. No dose adjustments required at this time.   Category C DDI between Xtandi and Clopidogrel: Clopidogrel is a moderate CYP2C8 inhibitor and may increase serum concentrations of Xtandi, recommend monitoring for increased ADEs of Xtandi - no baseline dose adjustment required.   Evaluated chart and no patient barriers to medication adherence noted.   Prescription has been e-scribed to the [facility] for benefits analysis and approval.  Oral Oncology Clinic will continue to follow for insurance authorization, copayment issues, initial counseling and start date.  Leron Croak, PharmD, BCPS Hematology/Oncology Clinical Pharmacist [facility] [phone] 05/05/2020 9:51 AM

## 2020-05-05 NOTE — Telephone Encounter (Signed)
Oral Oncology Patient Advocate Encounter  After completing a benefits investigation, prior authorization for [name] [name] is not required at this time through [facility].  Patient's copay is $3.    [place] Patient [place] Phone [phone] Fax [phone] 05/05/2020 9:31 AM

## 2020-05-16 ENCOUNTER — Other Ambulatory Visit: Payer: Self-pay

## 2020-05-16 ENCOUNTER — Inpatient Hospital Stay: Payer: Medicaid Other

## 2020-05-16 ENCOUNTER — Other Ambulatory Visit: Payer: Medicaid Other

## 2020-05-16 ENCOUNTER — Inpatient Hospital Stay (HOSPITAL_BASED_OUTPATIENT_CLINIC_OR_DEPARTMENT_OTHER): Payer: Medicaid Other | Admitting: Hematology and Oncology

## 2020-05-16 ENCOUNTER — Other Ambulatory Visit: Payer: Self-pay | Admitting: Hematology and Oncology

## 2020-05-16 VITALS — BP 163/98 | HR 73 | Temp 97.9°F | Resp 18 | Ht 73.0 in | Wt 175.2 lb

## 2020-05-16 DIAGNOSIS — C61 Malignant neoplasm of prostate: Secondary | ICD-10-CM

## 2020-05-16 DIAGNOSIS — C7951 Secondary malignant neoplasm of bone: Secondary | ICD-10-CM

## 2020-05-16 LAB — CMP (CANCER CENTER ONLY)
ALT: 20 U/L (ref 0–44)
AST: 16 U/L (ref 15–41)
Albumin: 4 g/dL (ref 3.5–5.0)
Alkaline Phosphatase: 72 U/L (ref 38–126)
Anion gap: 11 (ref 5–15)
BUN: 19 mg/dL (ref 8–23)
CO2: 24 mmol/L (ref 22–32)
Calcium: 9.8 mg/dL (ref 8.9–10.3)
Chloride: 104 mmol/L (ref 98–111)
Creatinine: 1.48 mg/dL — ABNORMAL HIGH (ref 0.61–1.24)
GFR, Estimated: 53 mL/min — ABNORMAL LOW (ref >60)
Glucose, Bld: 101 mg/dL — ABNORMAL HIGH (ref 70–99)
Potassium: 4.2 mmol/L (ref 3.5–5.1)
Sodium: 139 mmol/L (ref 135–145)
Total Bilirubin: <0.2 mg/dL — ABNORMAL LOW (ref 0.3–1.2)
Total Protein: 8.1 g/dL (ref 6.5–8.1)

## 2020-05-16 LAB — CBC WITH DIFFERENTIAL (CANCER CENTER ONLY)
Abs Immature Granulocytes: 0.06 10*3/uL (ref 0.00–0.07)
Basophils Absolute: 0 10*3/uL (ref 0.0–0.1)
Basophils Relative: 1 %
Eosinophils Absolute: 0.1 10*3/uL (ref 0.0–0.5)
Eosinophils Relative: 1 %
HCT: 35.3 % — ABNORMAL LOW (ref 39.0–52.0)
Hemoglobin: 11.4 g/dL — ABNORMAL LOW (ref 13.0–17.0)
Immature Granulocytes: 1 %
Lymphocytes Relative: 26 %
Lymphs Abs: 1.9 10*3/uL (ref 0.7–4.0)
MCH: 27 pg (ref 26.0–34.0)
MCHC: 32.3 g/dL (ref 30.0–36.0)
MCV: 83.5 fL (ref 80.0–100.0)
Monocytes Absolute: 0.6 10*3/uL (ref 0.1–1.0)
Monocytes Relative: 8 %
Neutro Abs: 4.6 10*3/uL (ref 1.7–7.7)
Neutrophils Relative %: 63 %
Platelet Count: 211 10*3/uL (ref 150–400)
RBC: 4.23 MIL/uL (ref 4.22–5.81)
RDW: 14.5 % (ref 11.5–15.5)
WBC Count: 7.3 10*3/uL (ref 4.0–10.5)
nRBC: 0 % (ref 0.0–0.2)

## 2020-05-16 LAB — LACTATE DEHYDROGENASE: LDH: 159 U/L (ref 98–192)

## 2020-05-17 LAB — PROSTATE-SPECIFIC AG, SERUM (LABCORP): Prostate Specific Ag, Serum: 0.6 ng/mL (ref 0.0–4.0)

## 2020-05-18 ENCOUNTER — Inpatient Hospital Stay: Payer: Medicaid Other | Admitting: General Practice

## 2020-05-18 DIAGNOSIS — C61 Malignant neoplasm of prostate: Secondary | ICD-10-CM

## 2020-05-18 NOTE — Progress Notes (Signed)
[place] CSW Progress Notes  Call to patient to check in and assess progress, "I am doing pretty good."  Plans to celebrate Thanksgiving w family members, is looking forward to this.  He is interested in increasing physical activity - CSW will mail him information on [employer] program at [facility].Marland Kitchen  He remains on the waiting list for Meals on Wheels - although he likes to cook for himself, he would appreciate the additional support w nutrition.  Encouraged him to call us as needed for support/resources.  Edwyna Shell, LCSW Clinical Social Worker Phone:  [phone]

## 2020-05-22 ENCOUNTER — Encounter: Payer: Self-pay | Admitting: Hematology and Oncology

## 2020-05-22 NOTE — Progress Notes (Signed)
[facility] [phone]   [phone]  PROGRESS NOTE  Patient Care Team: Rosilyn Mings. as PCP - General Lorretta Harp, MD as PCP - Cardiology (Cardiology)  Hematological/Oncological History # Metastatic Castrate Sensitive Prostate Cancer 1) 10/13/2019: CXR in the ED for shortness of breath showed masslike soft tissue fullness within the right hilum concerning for right hilar lymphadenopathy 2) 10/13/2019: CT Chest W contrast showed mediastinal and bilateral hilar adenopathy, right greater than left. Seen by pulmonary with plans for PET and biopsy, though he was lost to follow up.  3) 01/17/2020: sent to the ED by Orthopedic surgery due to metastasis of the spine noted on MRI. IN the ED CT C/A/P showed extensive left pelvic and retroperitoneal lymphadenopathy. Mediastinal and bilateral hilar lymphadenopathy as well as mixed lytic and sclerotic lesions are noted in the bony pelvis 4) 01/17/2020: PSA noted to be 1074.94 5) 01/22/2020: establish care with [name]. Started Bicalutamide 50mg  PO daily. Plan for 28 days of therapy.   6) 02/05/2020: Lupron 22.5mg  started.  7) 03/03/2020: received 45mg  lupron with urology. PSA 9.6 8) 03/30/2020: prostate biopsy performed, confirmed adenocarcinoma of the prostate.  9) 04/25/2020: stopped Zytiga therapy after < 7 days due to worsening depression 10) 04/28/2020: start enzalutamide 160mg  PO daily  Interval History:  [name] [name] 64 y.o. male with medical history significant for metastatic prostate cancer who presents for a follow up visit. The patient's last visit was on 04/28/2020. In the interim since the last visit [name] [name] has started enzalutamide therapy.   On exam today [name] [name] ports that he just began taking this medication on May 14, 2020.  He reports that he is not yet feeling the effects of this medication.  He still having some feelings of depression and feeling low and he at this point is  unsure if Zytiga therapy was related to this.  He has been talking with her Education officer, museum and reports that this has been "very helpful".  He has been having some fatigue but otherwise is not having hot flashes.  His blood pressure is stable today.  He had no additional questions comments or concerns.  In terms of his medication he notes he is not having any issues with fevers, chills, sweats, nausea, vomiting or diarrhea.  He has not noticed a decrease in his energy and he otherwise feels well.  Full 10 point ROS is listed below.  MEDICAL HISTORY:  Past Medical History:  Diagnosis Date  . Brain tumor [facility])    diagnosed by MRI 6 months ago  . Cancer ([place])    Pt reports brain tumor 2 years ago  . Coronary artery disease 2011   s/p BMS stent LAD, [facility], [facility] [place], [place], Dr Chauncey Cruel. [name]  . Depression   . Dyspnea   . High cholesterol   . History of herniated intervertebral disc   . HTN (hypertension)   . Hyperlipidemia LDL goal <70 07/21/2013  . Lung nodule   . NSTEMI (non-ST elevated myocardial infarction) ([place]) 11/11/2017  . Smoker    1/2 ppd x >40 years     SURGICAL HISTORY: Past Surgical History:  Procedure Laterality Date  . CORONARY ANGIOPLASTY WITH STENT PLACEMENT Left 2011   BMS LAD, [facility], [facility] [place], [place], Dr Chauncey Cruel. [name]  . CORONARY STENT INTERVENTION N/A 11/12/2017   Procedure: CORONARY STENT INTERVENTION;  Surgeon: Leonie Man, MD;  Location: [place] [facility];  Service: Cardiovascular;  Laterality: N/A;  .  LEFT HEART CATH AND CORONARY ANGIOGRAPHY N/A 11/12/2017   Procedure: LEFT HEART CATH AND CORONARY ANGIOGRAPHY;  Surgeon: Leonie Man, MD;  Location: [place] [facility];  Service: Cardiovascular;  Laterality: N/A;    SOCIAL HISTORY: Social History   Socioeconomic History  . Marital status: Single    Spouse name: Not on file  . Number of children: Not on file  . Years of education: Not on file  . Highest education level: Not on  file  Occupational History  . Occupation: unemployed  Tobacco Use  . Smoking status: Current Every Day Smoker    Packs/day: 0.25    Years: 45.00    Pack years: 11.25  . Smokeless tobacco: Never Used  Vaping Use  . Vaping Use: Never used  Substance and Sexual Activity  . Alcohol use: Yes    Alcohol/week: 75.0 standard drinks    Types: 75 Shots of liquor per week    Comment: 2 shots of liquor daily  . Drug use: Not Currently    Types: Marijuana    Comment: occasionally  . Sexual activity: Yes  Other Topics Concern  . Not on file  Social History Narrative   From [place] ([place]    Family lives in [place] (brother, sister and mother)    Unemployed    Drinks 3 (1/5)ths in 1 week   Social Determinants of Health   Financial Resource Strain: Low Risk   . Difficulty of Paying Living Expenses: Not very hard  Food Insecurity: No Food Insecurity  . Worried About Charity fundraiser in the Last Year: Never true  . Ran Out of Food in the Last Year: Never true  Transportation Needs: No Transportation Needs  . Lack of Transportation (Medical): No  . Lack of Transportation (Non-Medical): No  Physical Activity: Inactive  . Days of Exercise per Week: 0 days  . Minutes of Exercise per Session: 0 min  Stress: Stress Concern Present  . Feeling of Stress : To some extent  Social Connections: Socially Isolated  . Frequency of Communication with Friends and Family: Three times a week  . Frequency of Social Gatherings with Friends and Family: Three times a week  . Attends Religious Services: Never  . Active Member of Clubs or Organizations: No  . Attends Archivist Meetings: Never  . Marital Status: Never married  Intimate Partner Violence:   . Fear of Current or Ex-Partner: Not on file  . Emotionally Abused: Not on file  . Physically Abused: Not on file  . Sexually Abused: Not on file    FAMILY HISTORY: Family History  Problem Relation Age of Onset  . Diabetes Paternal  Grandfather   . Hypertension Paternal Grandfather     ALLERGIES:  has No Known Allergies.  MEDICATIONS:  Current Outpatient Medications  Medication Sig Dispense Refill  . aspirin 81 MG EC tablet Take 1 tablet (81 mg total) by mouth daily. 90 tablet 3  . atorvastatin (LIPITOR) 40 MG tablet Take 1 tablet (40 mg total) by mouth daily. 90 tablet 3  . calcium-vitamin D (OSCAL WITH D) 500-200 MG-UNIT tablet Take 1 tablet by mouth daily with breakfast. 90 tablet 3  . carvedilol (COREG) 6.25 MG tablet carvedilol 6.25 mg tablet  TAKE 1 TABLET BY MOUTH TWICE DAILY WITH A MEAL    . citalopram (CELEXA) 20 MG tablet Take 1 tablet (20 mg total) by mouth daily. 30 tablet 3  . clopidogrel (PLAVIX) 75 MG tablet Take 1 tablet (  75 mg total) by mouth daily. 90 tablet 3  . enzalutamide (XTANDI) 40 MG capsule Take 4 capsules (160 mg total) by mouth daily. 120 capsule 0  . gabapentin (NEURONTIN) 300 MG capsule Take 300 mg by mouth 3 (three) times daily as needed for pain.    Marland Kitchen lisinopril (ZESTRIL) 10 MG tablet Take 1 tablet (10 mg total) by mouth daily. 90 tablet 3  . nitroGLYCERIN (NITROSTAT) 0.4 MG SL tablet Place 1 tablet (0.4 mg total) under the tongue every 5 (five) minutes as needed for chest pain. 25 tablet 1  . oxyCODONE 10 MG TABS Take 0.5 tablets (5 mg total) by mouth every 4 (four) hours as needed for severe pain. (Patient not taking: Reported on 04/28/2020) 30 tablet 0  . predniSONE (DELTASONE) 5 MG tablet Take 1 tablet (5 mg total) by mouth daily with breakfast. 90 tablet 1   No current facility-administered medications for this visit.    REVIEW OF SYSTEMS:   Constitutional: ( - ) fevers, ( - )  chills , ( - ) night sweats Eyes: ( - ) blurriness of vision, ( - ) double vision, ( - ) watery eyes Ears, nose, mouth, throat, and face: ( - ) mucositis, ( - ) sore throat Respiratory: ( - ) cough, ( - ) dyspnea, ( - ) wheezes Cardiovascular: ( - ) palpitation, ( - ) chest discomfort, ( - ) lower  extremity swelling Gastrointestinal:  ( - ) nausea, ( - ) heartburn, ( - ) change in bowel habits Skin: ( - ) abnormal skin rashes Lymphatics: ( - ) new lymphadenopathy, ( - ) easy bruising Neurological: ( - ) numbness, ( - ) tingling, ( - ) new weaknesses Behavioral/Psych: ( - ) mood change, ( - ) new changes  All other systems were reviewed with the patient and are negative.  PHYSICAL EXAMINATION: ECOG PERFORMANCE STATUS: 1 - Symptomatic but completely ambulatory  Vitals:   05/16/20 1601  BP: (!) 163/98  Pulse: 73  Resp: 18  Temp: 97.9 F (36.6 C)  SpO2: 100%   Filed Weights   05/16/20 1601  Weight: 175 lb 3.2 oz (79.5 kg)    GENERAL:  Well appearing middle aged [place] American male. alert, no distress and comfortable SKIN: skin color, texture, turgor are normal, no rashes or significant lesions EYES: conjunctiva are pink and non-injected, sclera clear LUNGS: clear to auscultation and percussion with normal breathing effort HEART: regular rate & rhythm and no murmurs and no lower extremity edema Musculoskeletal: no cyanosis of digits and no clubbing  PSYCH: alert & oriented x 3, fluent speech NEURO: no focal motor/sensory deficits  LABORATORY DATA:  I have reviewed the data as listed CBC Latest Ref Rng & Units 05/16/2020 04/28/2020 04/06/2020  WBC 4.0 - 10.5 K/uL 7.3 6.6 6.3  Hemoglobin 13.0 - 17.0 g/dL 11.4(L) 11.9(L) 11.7(L)  Hematocrit 39 - 52 % 35.3(L) 37.2(L) 37.4(L)  Platelets 150 - 400 K/uL 211 190 189    CMP Latest Ref Rng & Units 05/16/2020 04/28/2020 04/06/2020  Glucose 70 - 99 mg/dL 101(H) 116(H) 95  BUN 8 - 23 mg/dL 19 19 15   Creatinine 0.61 - 1.24 mg/dL 1.48(H) 1.40(H) 1.47(H)  Sodium 135 - 145 mmol/L 139 139 140  Potassium 3.5 - 5.1 mmol/L 4.2 4.2 4.0  Chloride 98 - 111 mmol/L 104 104 106  CO2 22 - 32 mmol/L 24 26 29   Calcium 8.9 - 10.3 mg/dL 9.8 9.8 10.2  Total Protein 6.5 - 8.1 g/dL  8.1 8.0 8.2(H)  Total Bilirubin 0.3 - 1.2 mg/dL <0.2(L) <0.2(L)  0.3  Alkaline Phos 38 - 126 U/L 72 68 79  AST 15 - 41 U/L 16 18 20   ALT 0 - 44 U/L 20 23 19    RADIOGRAPHIC STUDIES: No results found.  ASSESSMENT & PLAN Koden Hunzeker 64 y.o. male with medical history significant for metastatic prostate cancer who presents for a follow up visit.    [name] [name] today notes he is just started the enzalutamide medication.  He has not noticed any ill effects yet, and also has not noticed the depression improving with the discontinuation of abiraterone.  Otherwise he is not having any side effects from the androgen deprivation therapy.  Previously we discussed the treatment options moving forward including d/c of abiraterone and the addition of enzalutamide to his ADT therapy.  I discussed the side effects of both of these medications and the required follow-up.  After weighing the pros and cons the patient wanted to proceed with the enzalutamide therapy and stop taking the abiraterone therapy.   The prostate biopsy has been obtained and confirms adenocarcinoma of the prostate. We can began treating with abiraterone 1000 mg p.o. daily with 5 mg of prednisone, however he d/c this due to worsening anxiety/depression. He will continue his q. 3 monthly 22.5 mg Lupron shots (most recent shot was a 45mg  on 03/03/2020, next due March 2022).  Additionally testosterone and PSA levels will be rechecked q 3 months. There is no clear indication for bisphosphonate therapy in the setting of castrate sensitive prostate cancer with metastatic disease to the bone.  # Metastatic Castrate Sensitive Prostate Cancer  --repeat CT C/A/P Scan and NM bone scan due Jan 2022. Will repeat q 3 months initially.  --prostate biopsy performed by urology on 03/30/2020, confirms diagnosis of prostate adenocarcinoma. Gleason score not calculated due to ADT therapy  --Received dose of lupron 45mg  on 02/29/2020. Next due in March 2022. --calcium/vitamin D for bone protection on ADT therapy.  --continue  enzalutamide 160mg  PO daily --the literature does not currently support the use of bisphosphonates in the setting of CSPC.  --have patient RTC 4 weeks after start of enzalutamide to assess his progress  #Symptom management -- oxycodone 5mg  q4H PRN for pain. He has not required these as his pain is currently under control --encourage OTC senna for constipation prevention if taking opioids.  #Goals of Care --discussed the incurable nature of metastatic disease --emphasized that all treatments moving forward are focused on symptom control, decreasing tumor size/slow spread, and increasing lifespan.  --patient voiced understanding of the palliative nature of treatments moving forward.  --patients MPOA is Chanel Sarina Ill (his daughter)   No orders of the defined types were placed in this encounter.   All questions were answered. The patient knows to call the clinic with any problems, questions or concerns.  A total of more than 30 minutes were spent on this encounter and over half of that time was spent on counseling and coordination of care as outlined above.   [name] Peoples, MD Department of Hematology/Oncology [place] [facility] Phone: [phone] Pager: [phone] Email: [email or web address]  05/22/2020 6:57 PM   Literature Support:  Juanda Crumble A Systematic Review and Meta-Analysis about the Effect of Bisphosphonates on the Risk of Skeletal-Related Event in Men with Prostate Cancer. Anticancer Agents Med Chem. 2020;20(13):1604-1612.  --Our study demonstrated that bisphosphonates could not statistically significantly reduce the risk of SRE  in patients with prostate cancer, neither in the subgroups with M1 or CSPC.

## 2020-05-30 ENCOUNTER — Other Ambulatory Visit: Payer: Self-pay

## 2020-05-30 ENCOUNTER — Inpatient Hospital Stay: Payer: Medicaid Other | Attending: Hematology and Oncology

## 2020-05-30 ENCOUNTER — Other Ambulatory Visit: Payer: Self-pay | Admitting: Hematology and Oncology

## 2020-05-30 DIAGNOSIS — C61 Malignant neoplasm of prostate: Secondary | ICD-10-CM | POA: Insufficient documentation

## 2020-05-30 DIAGNOSIS — F329 Major depressive disorder, single episode, unspecified: Secondary | ICD-10-CM | POA: Insufficient documentation

## 2020-05-30 DIAGNOSIS — Z79899 Other long term (current) drug therapy: Secondary | ICD-10-CM | POA: Diagnosis not present

## 2020-05-30 DIAGNOSIS — Z7952 Long term (current) use of systemic steroids: Secondary | ICD-10-CM | POA: Insufficient documentation

## 2020-05-30 DIAGNOSIS — I252 Old myocardial infarction: Secondary | ICD-10-CM | POA: Insufficient documentation

## 2020-05-30 DIAGNOSIS — F17210 Nicotine dependence, cigarettes, uncomplicated: Secondary | ICD-10-CM | POA: Diagnosis not present

## 2020-05-30 LAB — CMP (CANCER CENTER ONLY)
ALT: 21 U/L (ref 0–44)
AST: 18 U/L (ref 15–41)
Albumin: 4.1 g/dL (ref 3.5–5.0)
Alkaline Phosphatase: 69 U/L (ref 38–126)
Anion gap: 8 (ref 5–15)
BUN: 25 mg/dL — ABNORMAL HIGH (ref 8–23)
CO2: 27 mmol/L (ref 22–32)
Calcium: 10.5 mg/dL — ABNORMAL HIGH (ref 8.9–10.3)
Chloride: 106 mmol/L (ref 98–111)
Creatinine: 1.72 mg/dL — ABNORMAL HIGH (ref 0.61–1.24)
GFR, Estimated: 44 mL/min — ABNORMAL LOW (ref >60)
Glucose, Bld: 99 mg/dL (ref 70–99)
Potassium: 4.3 mmol/L (ref 3.5–5.1)
Sodium: 141 mmol/L (ref 135–145)
Total Bilirubin: <0.2 mg/dL — ABNORMAL LOW (ref 0.3–1.2)
Total Protein: 8.4 g/dL — ABNORMAL HIGH (ref 6.5–8.1)

## 2020-05-30 LAB — CBC WITH DIFFERENTIAL (CANCER CENTER ONLY)
Abs Immature Granulocytes: 0.03 10*3/uL (ref 0.00–0.07)
Basophils Absolute: 0.1 10*3/uL (ref 0.0–0.1)
Basophils Relative: 1 %
Eosinophils Absolute: 0.2 10*3/uL (ref 0.0–0.5)
Eosinophils Relative: 3 %
HCT: 35.1 % — ABNORMAL LOW (ref 39.0–52.0)
Hemoglobin: 11.1 g/dL — ABNORMAL LOW (ref 13.0–17.0)
Immature Granulocytes: 0 %
Lymphocytes Relative: 32 %
Lymphs Abs: 2.5 10*3/uL (ref 0.7–4.0)
MCH: 26.6 pg (ref 26.0–34.0)
MCHC: 31.6 g/dL (ref 30.0–36.0)
MCV: 84 fL (ref 80.0–100.0)
Monocytes Absolute: 0.7 10*3/uL (ref 0.1–1.0)
Monocytes Relative: 9 %
Neutro Abs: 4.2 10*3/uL (ref 1.7–7.7)
Neutrophils Relative %: 55 %
Platelet Count: 184 10*3/uL (ref 150–400)
RBC: 4.18 MIL/uL — ABNORMAL LOW (ref 4.22–5.81)
RDW: 15 % (ref 11.5–15.5)
WBC Count: 7.7 10*3/uL (ref 4.0–10.5)
nRBC: 0 % (ref 0.0–0.2)

## 2020-06-14 ENCOUNTER — Other Ambulatory Visit: Payer: Self-pay | Admitting: Hematology and Oncology

## 2020-06-14 DIAGNOSIS — C61 Malignant neoplasm of prostate: Secondary | ICD-10-CM

## 2020-06-14 NOTE — Progress Notes (Signed)
[place] [phone]   [phone]  PROGRESS NOTE  Patient Care Team: Rosilyn Mings. as PCP - General Lorretta Harp, MD as PCP - Cardiology (Cardiology)  Hematological/Oncological History # Metastatic Castrate Sensitive Prostate Cancer 1) 10/13/2019: CXR in the ED for shortness of breath showed masslike soft tissue fullness within the right hilum concerning for right hilar lymphadenopathy 2) 10/13/2019: CT Chest W contrast showed mediastinal and bilateral hilar adenopathy, right greater than left. Seen by pulmonary with plans for PET and biopsy, though he was lost to follow up.  3) 01/17/2020: sent to the ED by Orthopedic surgery due to metastasis of the spine noted on MRI. IN the ED CT C/A/P showed extensive left pelvic and retroperitoneal lymphadenopathy. Mediastinal and bilateral hilar lymphadenopathy as well as mixed lytic and sclerotic lesions are noted in the bony pelvis 4) 01/17/2020: PSA noted to be 1074.94 5) 01/22/2020: establish care with [name]. Started Bicalutamide 50mg  PO daily. Plan for 28 days of therapy.   6) 02/05/2020: Lupron 22.5mg  started.  7) 03/03/2020: received 45mg  lupron with urology. PSA 9.6 8) 03/30/2020: prostate biopsy performed, confirmed adenocarcinoma of the prostate.  9) 04/25/2020: stopped Zytiga therapy after < 7 days due to worsening depression 10) 04/28/2020: received enzalutamide 160mg , did not take any doses 11) 06/15/2020: agreed to restart Zytiga 1000mg  daily with prednisone 5mg    Interval History:  [name] [name] 64 y.o. male with medical history significant for metastatic prostate cancer who presents for a follow up visit. The patient's last visit was on 05/16/2020. In the interim since the last visit [name] [name] has continued enzalutamide therapy.   On exam today [name] [name] is accompanied by his daughter.  He reports that although he received the enzalutamide therapy he never actually started taking it.   He notes that he is tolerated ADT therapy well overall with no hot flashes, sweats, though he does induce having some fatigue/tiredness.  His appetite is good and his weight is actually increased from his prior visit.  He notes that he is not having any difficulties with pain at this time.  On further discussion he notes that he recently stopped Zytiga therapy because he thought was worsening his depression, however he thinks now maybe the depression was worsening from other causes.  He was prescribed Celexa by [place] but he is also not started taking that.  He has fulfilled the prescription of enzalutamide but the bottle remains unopened.  After discussion today he was agreeable to restarting Zytiga 1000 mg daily with prednisone.  A full 10 point ROS is listed below  MEDICAL HISTORY:  Past Medical History:  Diagnosis Date  . Brain tumor [facility])    diagnosed by MRI 6 months ago  . Cancer ([employer])    Pt reports brain tumor 2 years ago  . Coronary artery disease 2011   s/p BMS stent LAD, [facility] [place], [place], Dr Chauncey Cruel. [name]  . Depression   . Dyspnea   . High cholesterol   . History of herniated intervertebral disc   . HTN (hypertension)   . Hyperlipidemia LDL goal <70 07/21/2013  . Lung nodule   . NSTEMI (non-ST elevated myocardial infarction) ([place]) 11/11/2017  . Smoker    1/2 ppd x >40 years     SURGICAL HISTORY: Past Surgical History:  Procedure Laterality Date  . CORONARY ANGIOPLASTY WITH STENT PLACEMENT Left 2011   BMS LAD, [facility], [facility] [place], [place], Dr Chauncey Cruel. [place]  INTERVENTION N/A 11/12/2017   Procedure: CORONARY STENT INTERVENTION;  Surgeon: Leonie Man, MD;  Location: [facility];  Service: Cardiovascular;  Laterality: N/A;  . LEFT HEART CATH AND CORONARY ANGIOGRAPHY N/A 11/12/2017   Procedure: LEFT HEART CATH AND CORONARY ANGIOGRAPHY;  Surgeon: Leonie Man, MD;  Location: [facility];  Service: Cardiovascular;   Laterality: N/A;    SOCIAL HISTORY: Social History   Socioeconomic History  . Marital status: Single    Spouse name: Not on file  . Number of children: Not on file  . Years of education: Not on file  . Highest education level: Not on file  Occupational History  . Occupation: unemployed  Tobacco Use  . Smoking status: Current Every Day Smoker    Packs/day: 0.25    Years: 45.00    Pack years: 11.25  . Smokeless tobacco: Never Used  Vaping Use  . Vaping Use: Never used  Substance and Sexual Activity  . Alcohol use: Yes    Alcohol/week: 75.0 standard drinks    Types: 75 Shots of liquor per week    Comment: 2 shots of liquor daily  . Drug use: Not Currently    Types: Marijuana    Comment: occasionally  . Sexual activity: Yes  Other Topics Concern  . Not on file  Social History Narrative   From [place]    Family lives in [place] (brother, sister and mother)    Unemployed    Drinks 3 (1/5)ths in 1 week   Social Determinants of Health   Financial Resource Strain: Low Risk   . Difficulty of Paying Living Expenses: Not very hard  Food Insecurity: No Food Insecurity  . Worried About Charity fundraiser in the Last Year: Never true  . Ran Out of Food in the Last Year: Never true  Transportation Needs: No Transportation Needs  . Lack of Transportation (Medical): No  . Lack of Transportation (Non-Medical): No  Physical Activity: Inactive  . Days of Exercise per Week: 0 days  . Minutes of Exercise per Session: 0 min  Stress: Stress Concern Present  . Feeling of Stress : To some extent  Social Connections: Socially Isolated  . Frequency of Communication with Friends and Family: Three times a week  . Frequency of Social Gatherings with Friends and Family: Three times a week  . Attends Religious Services: Never  . Active Member of Clubs or Organizations: No  . Attends Archivist Meetings: Never  . Marital Status: Never married  Human resources officer Violence: Not  on file    FAMILY HISTORY: Family History  Problem Relation Age of Onset  . Diabetes Paternal Grandfather   . Hypertension Paternal Grandfather     ALLERGIES:  has No Known Allergies.  MEDICATIONS:  Current Outpatient Medications  Medication Sig Dispense Refill  . aspirin 81 MG EC tablet Take 1 tablet (81 mg total) by mouth daily. 90 tablet 3  . atorvastatin (LIPITOR) 40 MG tablet Take 1 tablet (40 mg total) by mouth daily. 90 tablet 3  . calcium-vitamin D (OSCAL WITH D) 500-200 MG-UNIT tablet Take 1 tablet by mouth daily with breakfast. 90 tablet 3  . carvedilol (COREG) 6.25 MG tablet carvedilol 6.25 mg tablet  TAKE 1 TABLET BY MOUTH TWICE DAILY WITH A MEAL    . citalopram (CELEXA) 20 MG tablet Take 1 tablet (20 mg total) by mouth daily. (Patient not taking: Reported on 06/15/2020) 30 tablet 3  . clopidogrel (PLAVIX)  75 MG tablet Take 1 tablet (75 mg total) by mouth daily. 90 tablet 3  . enzalutamide (XTANDI) 40 MG capsule Take 4 capsules (160 mg total) by mouth daily. 120 capsule 0  . gabapentin (NEURONTIN) 300 MG capsule Take 300 mg by mouth 3 (three) times daily as needed for pain. (Patient not taking: Reported on 06/15/2020)    . lisinopril (ZESTRIL) 10 MG tablet Take 1 tablet (10 mg total) by mouth daily. 90 tablet 3  . nitroGLYCERIN (NITROSTAT) 0.4 MG SL tablet Place 1 tablet (0.4 mg total) under the tongue every 5 (five) minutes as needed for chest pain. 25 tablet 1  . oxyCODONE 10 MG TABS Take 0.5 tablets (5 mg total) by mouth every 4 (four) hours as needed for severe pain. (Patient not taking: Reported on 04/28/2020) 30 tablet 0  . predniSONE (DELTASONE) 5 MG tablet Take 1 tablet (5 mg total) by mouth daily with breakfast. 90 tablet 1   No current facility-administered medications for this visit.    REVIEW OF SYSTEMS:   Constitutional: ( - ) fevers, ( - )  chills , ( - ) night sweats Eyes: ( - ) blurriness of vision, ( - ) double vision, ( - ) watery eyes Ears, nose,  mouth, throat, and face: ( - ) mucositis, ( - ) sore throat Respiratory: ( - ) cough, ( - ) dyspnea, ( - ) wheezes Cardiovascular: ( - ) palpitation, ( - ) chest discomfort, ( - ) lower extremity swelling Gastrointestinal:  ( - ) nausea, ( - ) heartburn, ( - ) change in bowel habits Skin: ( - ) abnormal skin rashes Lymphatics: ( - ) new lymphadenopathy, ( - ) easy bruising Neurological: ( - ) numbness, ( - ) tingling, ( - ) new weaknesses Behavioral/Psych: ( - ) mood change, ( - ) new changes  All other systems were reviewed with the patient and are negative.  PHYSICAL EXAMINATION: ECOG PERFORMANCE STATUS: 1 - Symptomatic but completely ambulatory  Vitals:   06/15/20 1041  BP: (!) 153/77  Pulse: 64  Resp: 18  Temp: 98.5 F (36.9 C)  SpO2: 100%   Filed Weights   06/15/20 1041  Weight: 181 lb (82.1 kg)    GENERAL:  Well appearing middle aged [place] American male. alert, no distress and comfortable SKIN: skin color, texture, turgor are normal, no rashes or significant lesions EYES: conjunctiva are pink and non-injected, sclera clear LUNGS: clear to auscultation and percussion with normal breathing effort HEART: regular rate & rhythm and no murmurs and no lower extremity edema Musculoskeletal: no cyanosis of digits and no clubbing  PSYCH: alert & oriented x 3, fluent speech NEURO: no focal motor/sensory deficits  LABORATORY DATA:  I have reviewed the data as listed CBC Latest Ref Rng & Units 06/15/2020 05/30/2020 05/16/2020  WBC 4.0 - 10.5 K/uL 7.1 7.7 7.3  Hemoglobin 13.0 - 17.0 g/dL 11.3(L) 11.1(L) 11.4(L)  Hematocrit 39.0 - 52.0 % 36.1(L) 35.1(L) 35.3(L)  Platelets 150 - 400 K/uL 217 184 211    CMP Latest Ref Rng & Units 06/15/2020 05/30/2020 05/16/2020  Glucose 70 - 99 mg/dL 125(H) 99 101(H)  BUN 8 - 23 mg/dL 19 25(H) 19  Creatinine 0.61 - 1.24 mg/dL 1.62(H) 1.72(H) 1.48(H)  Sodium 135 - 145 mmol/L 139 141 139  Potassium 3.5 - 5.1 mmol/L 4.1 4.3 4.2  Chloride 98 -  111 mmol/L 106 106 104  CO2 22 - 32 mmol/L 25 27 24   Calcium 8.9 - 10.3  mg/dL 10.4(H) 10.5(H) 9.8  Total Protein 6.5 - 8.1 g/dL 8.6(H) 8.4(H) 8.1  Total Bilirubin 0.3 - 1.2 mg/dL 0.2(L) <0.2(L) <0.2(L)  Alkaline Phos 38 - 126 U/L 73 69 72  AST 15 - 41 U/L 19 18 16   ALT 0 - 44 U/L 24 21 20    RADIOGRAPHIC STUDIES: No results found.  ASSESSMENT & PLAN [name] [name] 64 y.o. male with medical history significant for metastatic prostate cancer who presents for a follow up visit.    [name] [name] today notes in the interim since her last visit he has not taken any of the enzalutamide.  Fortunately he is otherwise well with stable weight, no hot flashes, chills, sweats, though he does have some issues with fatigue.  Overall the main goal of today's visit was to discuss abiraterone versus enzalutamide therapy in greater detail.  Previously we discussed the treatment options moving forward including d/c of abiraterone and the addition of enzalutamide to his ADT therapy.  I discussed the side effects of both of these medications and the required follow-up.  After weighing the pros and cons the patient wanted to proceed with the enzalutamide therapy and stop taking the abiraterone therapy. However, he now reports he has never taken the enzalutamide as he is concerned about the side effects. Today he wanted to discuss the risks/benefits of both therapies in detail.   The prostate biopsy has been obtained and confirms adenocarcinoma of the prostate. We can began treating with abiraterone 1000 mg p.o. daily with 5 mg of prednisone, however he d/c this due to worsening anxiety/depression. He will continue his q. 3 monthly 22.5 mg Lupron shots (most recent shot was a 45mg  on 03/03/2020, next due March 2022).  Additionally testosterone and PSA levels will be rechecked q 3 months. There is no clear indication for bisphosphonate therapy in the setting of castrate sensitive prostate cancer with metastatic disease to  the bone.  After discussion today he was agreeable to restarting Zytiga 1000 mg daily with prednisone.  We will set him up for every 2 week lab work and a 4-week follow-up.  # Metastatic Castrate Sensitive Prostate Cancer  --repeat CT C/A/P Scan and NM bone scan due Jan 2022. Will repeat q 3 months initially.  --prostate biopsy performed by urology on 03/30/2020, confirms diagnosis of prostate adenocarcinoma. Gleason score not calculated due to ADT therapy  --Received dose of lupron 45mg  on 02/29/2020. Next due in March 2022. --calcium/vitamin D for bone protection on ADT therapy.  --restart abiraterone 1000mg  PO daily with 5 mg prednisone.  --the literature does not currently support the use of bisphosphonates in the setting of CSPC.  --have patient RTC 4 weeks with an interval 2 week lab visit.   #Symptom management -- oxycodone 5mg  q4H PRN for pain. He has not required these as his pain is currently under control --encourage OTC senna for constipation prevention if taking opioids.  #Goals of Care --discussed the incurable nature of metastatic disease --emphasized that all treatments moving forward are focused on symptom control, decreasing tumor size/slow spread, and increasing lifespan.  --patient voiced understanding of the palliative nature of treatments moving forward.  --patients MPOA is Chanel Sarina Ill (his daughter)   No orders of the defined types were placed in this encounter.   All questions were answered. The patient knows to call the clinic with any problems, questions or concerns.  A total of more than 30 minutes were spent on this encounter and over half of that time was spent  on counseling and coordination of care as outlined above.   Ledell Peoples, MD Department of Hematology/Oncology [place] [facility] Phone: [phone] Pager: [phone] Email: [email or web address]  06/15/2020 7:45 PM   Literature Support:  Juanda Crumble A Systematic Review and Meta-Analysis about the Effect of Bisphosphonates on the Risk of Skeletal-Related Event in Men with Prostate Cancer. Anticancer Agents Med Chem. 2020;20(13):1604-1612.  --Our study demonstrated that bisphosphonates could not statistically significantly reduce the risk of SRE in patients with prostate cancer, neither in the subgroups with M1 or CSPC.

## 2020-06-15 ENCOUNTER — Encounter: Payer: Self-pay | Admitting: Hematology and Oncology

## 2020-06-15 ENCOUNTER — Inpatient Hospital Stay: Payer: Medicaid Other

## 2020-06-15 ENCOUNTER — Other Ambulatory Visit: Payer: Self-pay

## 2020-06-15 ENCOUNTER — Inpatient Hospital Stay (HOSPITAL_BASED_OUTPATIENT_CLINIC_OR_DEPARTMENT_OTHER): Payer: Medicaid Other | Admitting: Hematology and Oncology

## 2020-06-15 VITALS — BP 153/77 | HR 64 | Temp 98.5°F | Resp 18 | Ht 73.0 in | Wt 181.0 lb

## 2020-06-15 DIAGNOSIS — C7951 Secondary malignant neoplasm of bone: Secondary | ICD-10-CM | POA: Diagnosis not present

## 2020-06-15 DIAGNOSIS — C61 Malignant neoplasm of prostate: Secondary | ICD-10-CM

## 2020-06-15 DIAGNOSIS — R590 Localized enlarged lymph nodes: Secondary | ICD-10-CM

## 2020-06-15 LAB — CBC WITH DIFFERENTIAL (CANCER CENTER ONLY)
Abs Immature Granulocytes: 0.03 10*3/uL (ref 0.00–0.07)
Basophils Absolute: 0.1 10*3/uL (ref 0.0–0.1)
Basophils Relative: 1 %
Eosinophils Absolute: 0.1 10*3/uL (ref 0.0–0.5)
Eosinophils Relative: 2 %
HCT: 36.1 % — ABNORMAL LOW (ref 39.0–52.0)
Hemoglobin: 11.3 g/dL — ABNORMAL LOW (ref 13.0–17.0)
Immature Granulocytes: 0 %
Lymphocytes Relative: 30 %
Lymphs Abs: 2.2 10*3/uL (ref 0.7–4.0)
MCH: 26.6 pg (ref 26.0–34.0)
MCHC: 31.3 g/dL (ref 30.0–36.0)
MCV: 84.9 fL (ref 80.0–100.0)
Monocytes Absolute: 0.6 10*3/uL (ref 0.1–1.0)
Monocytes Relative: 8 %
Neutro Abs: 4.2 10*3/uL (ref 1.7–7.7)
Neutrophils Relative %: 59 %
Platelet Count: 217 10*3/uL (ref 150–400)
RBC: 4.25 MIL/uL (ref 4.22–5.81)
RDW: 15.1 % (ref 11.5–15.5)
WBC Count: 7.1 10*3/uL (ref 4.0–10.5)
nRBC: 0 % (ref 0.0–0.2)

## 2020-06-15 LAB — CMP (CANCER CENTER ONLY)
ALT: 24 U/L (ref 0–44)
AST: 19 U/L (ref 15–41)
Albumin: 4.2 g/dL (ref 3.5–5.0)
Alkaline Phosphatase: 73 U/L (ref 38–126)
Anion gap: 8 (ref 5–15)
BUN: 19 mg/dL (ref 8–23)
CO2: 25 mmol/L (ref 22–32)
Calcium: 10.4 mg/dL — ABNORMAL HIGH (ref 8.9–10.3)
Chloride: 106 mmol/L (ref 98–111)
Creatinine: 1.62 mg/dL — ABNORMAL HIGH (ref 0.61–1.24)
GFR, Estimated: 48 mL/min — ABNORMAL LOW (ref >60)
Glucose, Bld: 125 mg/dL — ABNORMAL HIGH (ref 70–99)
Potassium: 4.1 mmol/L (ref 3.5–5.1)
Sodium: 139 mmol/L (ref 135–145)
Total Bilirubin: 0.2 mg/dL — ABNORMAL LOW (ref 0.3–1.2)
Total Protein: 8.6 g/dL — ABNORMAL HIGH (ref 6.5–8.1)

## 2020-06-16 LAB — TESTOSTERONE: Testosterone: <3 ng/dL — ABNORMAL LOW (ref 264–916)

## 2020-06-16 LAB — PROSTATE-SPECIFIC AG, SERUM (LABCORP): Prostate Specific Ag, Serum: 0.7 ng/mL (ref 0.0–4.0)

## 2020-06-20 ENCOUNTER — Telehealth: Payer: Self-pay | Admitting: *Deleted

## 2020-06-20 NOTE — Telephone Encounter (Signed)
[name] patient regarding lab results.  Spoke with patient and advised that his PSA and testosterone levels are at goal range.  Pt voiced understanding. Inquired if pt was taking both his Zytiga and his prednisone. He stated he was taking both as directed. No further questions or concerns.

## 2020-06-20 NOTE — Telephone Encounter (Signed)
-----   Message from Orson Slick, MD sent at 06/17/2020 12:06 PM EST ----- Please let [name] [name] know that his labs are stable, PSA 0.7 and testosterone is at goal. Please assure he has started taking his Zytiga with prednisone.   ----- Message ----- From: Buel Ream, Lab In [place] Sent: 06/15/2020  10:38 AM EST To: Orson Slick, MD

## 2020-06-29 ENCOUNTER — Inpatient Hospital Stay: Payer: Medicaid Other | Attending: Hematology and Oncology

## 2020-06-29 ENCOUNTER — Other Ambulatory Visit: Payer: Self-pay | Admitting: Hematology and Oncology

## 2020-06-29 ENCOUNTER — Other Ambulatory Visit: Payer: Self-pay

## 2020-06-29 DIAGNOSIS — Z79899 Other long term (current) drug therapy: Secondary | ICD-10-CM | POA: Insufficient documentation

## 2020-06-29 DIAGNOSIS — C61 Malignant neoplasm of prostate: Secondary | ICD-10-CM | POA: Insufficient documentation

## 2020-06-29 DIAGNOSIS — C7951 Secondary malignant neoplasm of bone: Secondary | ICD-10-CM | POA: Diagnosis present

## 2020-06-29 DIAGNOSIS — F17210 Nicotine dependence, cigarettes, uncomplicated: Secondary | ICD-10-CM | POA: Insufficient documentation

## 2020-06-29 LAB — CBC WITH DIFFERENTIAL (CANCER CENTER ONLY)
Abs Immature Granulocytes: 0.05 10*3/uL (ref 0.00–0.07)
Basophils Absolute: 0.1 10*3/uL (ref 0.0–0.1)
Basophils Relative: 1 %
Eosinophils Absolute: 0.1 10*3/uL (ref 0.0–0.5)
Eosinophils Relative: 2 %
HCT: 35.4 % — ABNORMAL LOW (ref 39.0–52.0)
Hemoglobin: 11.2 g/dL — ABNORMAL LOW (ref 13.0–17.0)
Immature Granulocytes: 1 %
Lymphocytes Relative: 25 %
Lymphs Abs: 1.9 10*3/uL (ref 0.7–4.0)
MCH: 27.4 pg (ref 26.0–34.0)
MCHC: 31.6 g/dL (ref 30.0–36.0)
MCV: 86.6 fL (ref 80.0–100.0)
Monocytes Absolute: 0.6 10*3/uL (ref 0.1–1.0)
Monocytes Relative: 8 %
Neutro Abs: 4.9 10*3/uL (ref 1.7–7.7)
Neutrophils Relative %: 63 %
Platelet Count: 192 10*3/uL (ref 150–400)
RBC: 4.09 MIL/uL — ABNORMAL LOW (ref 4.22–5.81)
RDW: 15.3 % (ref 11.5–15.5)
WBC Count: 7.7 10*3/uL (ref 4.0–10.5)
nRBC: 0 % (ref 0.0–0.2)

## 2020-06-29 LAB — CMP (CANCER CENTER ONLY)
ALT: 24 U/L (ref 0–44)
AST: 22 U/L (ref 15–41)
Albumin: 4.1 g/dL (ref 3.5–5.0)
Alkaline Phosphatase: 65 U/L (ref 38–126)
Anion gap: 10 (ref 5–15)
BUN: 21 mg/dL (ref 8–23)
CO2: 24 mmol/L (ref 22–32)
Calcium: 10 mg/dL (ref 8.9–10.3)
Chloride: 106 mmol/L (ref 98–111)
Creatinine: 1.62 mg/dL — ABNORMAL HIGH (ref 0.61–1.24)
GFR, Estimated: 48 mL/min — ABNORMAL LOW (ref >60)
Glucose, Bld: 110 mg/dL — ABNORMAL HIGH (ref 70–99)
Potassium: 4.1 mmol/L (ref 3.5–5.1)
Sodium: 140 mmol/L (ref 135–145)
Total Bilirubin: 0.3 mg/dL (ref 0.3–1.2)
Total Protein: 8.3 g/dL — ABNORMAL HIGH (ref 6.5–8.1)

## 2020-07-14 ENCOUNTER — Other Ambulatory Visit: Payer: Self-pay

## 2020-07-14 ENCOUNTER — Other Ambulatory Visit: Payer: Self-pay | Admitting: Hematology and Oncology

## 2020-07-14 ENCOUNTER — Inpatient Hospital Stay: Payer: Medicaid Other

## 2020-07-14 ENCOUNTER — Inpatient Hospital Stay (HOSPITAL_BASED_OUTPATIENT_CLINIC_OR_DEPARTMENT_OTHER): Payer: Medicaid Other | Admitting: Hematology and Oncology

## 2020-07-14 VITALS — BP 166/74 | HR 89 | Temp 98.7°F | Resp 18 | Ht 73.0 in | Wt 178.8 lb

## 2020-07-14 DIAGNOSIS — C7951 Secondary malignant neoplasm of bone: Secondary | ICD-10-CM | POA: Diagnosis not present

## 2020-07-14 DIAGNOSIS — C61 Malignant neoplasm of prostate: Secondary | ICD-10-CM | POA: Diagnosis not present

## 2020-07-14 DIAGNOSIS — Z7189 Other specified counseling: Secondary | ICD-10-CM | POA: Diagnosis not present

## 2020-07-14 DIAGNOSIS — R590 Localized enlarged lymph nodes: Secondary | ICD-10-CM | POA: Diagnosis not present

## 2020-07-14 LAB — CBC WITH DIFFERENTIAL (CANCER CENTER ONLY)
Abs Immature Granulocytes: 0.02 10*3/uL (ref 0.00–0.07)
Basophils Absolute: 0.1 10*3/uL (ref 0.0–0.1)
Basophils Relative: 1 %
Eosinophils Absolute: 0.1 10*3/uL (ref 0.0–0.5)
Eosinophils Relative: 2 %
HCT: 35.7 % — ABNORMAL LOW (ref 39.0–52.0)
Hemoglobin: 11.4 g/dL — ABNORMAL LOW (ref 13.0–17.0)
Immature Granulocytes: 0 %
Lymphocytes Relative: 32 %
Lymphs Abs: 2.4 10*3/uL (ref 0.7–4.0)
MCH: 28.1 pg (ref 26.0–34.0)
MCHC: 31.9 g/dL (ref 30.0–36.0)
MCV: 88.1 fL (ref 80.0–100.0)
Monocytes Absolute: 0.6 10*3/uL (ref 0.1–1.0)
Monocytes Relative: 8 %
Neutro Abs: 4.4 10*3/uL (ref 1.7–7.7)
Neutrophils Relative %: 57 %
Platelet Count: 158 10*3/uL (ref 150–400)
RBC: 4.05 MIL/uL — ABNORMAL LOW (ref 4.22–5.81)
RDW: 15 % (ref 11.5–15.5)
WBC Count: 7.6 10*3/uL (ref 4.0–10.5)
nRBC: 0 % (ref 0.0–0.2)

## 2020-07-14 LAB — CMP (CANCER CENTER ONLY)
ALT: 24 U/L (ref 0–44)
AST: 20 U/L (ref 15–41)
Albumin: 4.1 g/dL (ref 3.5–5.0)
Alkaline Phosphatase: 71 U/L (ref 38–126)
Anion gap: 11 (ref 5–15)
BUN: 19 mg/dL (ref 8–23)
CO2: 23 mmol/L (ref 22–32)
Calcium: 9.7 mg/dL (ref 8.9–10.3)
Chloride: 106 mmol/L (ref 98–111)
Creatinine: 1.5 mg/dL — ABNORMAL HIGH (ref 0.61–1.24)
GFR, Estimated: 52 mL/min — ABNORMAL LOW (ref >60)
Glucose, Bld: 115 mg/dL — ABNORMAL HIGH (ref 70–99)
Potassium: 3.7 mmol/L (ref 3.5–5.1)
Sodium: 140 mmol/L (ref 135–145)
Total Bilirubin: 0.2 mg/dL — ABNORMAL LOW (ref 0.3–1.2)
Total Protein: 8.4 g/dL — ABNORMAL HIGH (ref 6.5–8.1)

## 2020-07-14 NOTE — Progress Notes (Signed)
[place] [phone]   [phone]) [phone]  PROGRESS NOTE  Patient Care Team: Rosilyn Mings. as PCP - General Lorretta Harp, MD as PCP - Cardiology (Cardiology)  Hematological/Oncological History # Metastatic Castrate Sensitive Prostate Cancer 1) 10/13/2019: CXR in the ED for shortness of breath showed masslike soft tissue fullness within the right hilum concerning for right hilar lymphadenopathy 2) 10/13/2019: CT Chest W contrast showed mediastinal and bilateral hilar adenopathy, right greater than left. Seen by pulmonary with plans for PET and biopsy, though he was lost to follow up.  3) 01/17/2020: sent to the ED by Orthopedic surgery due to metastasis of the spine noted on MRI. IN the ED CT C/A/P showed extensive left pelvic and retroperitoneal lymphadenopathy. Mediastinal and bilateral hilar lymphadenopathy as well as mixed lytic and sclerotic lesions are noted in the bony pelvis 4) 01/17/2020: PSA noted to be 1074.94 5) 01/22/2020: establish care with [name]. Started Bicalutamide 50mg  PO daily. Plan for 28 days of therapy.   6) 02/05/2020: Lupron 22.5mg  started.  7) 03/03/2020: received 45mg  lupron with urology. PSA 9.6 8) 03/30/2020: prostate biopsy performed, confirmed adenocarcinoma of the prostate.  9) 04/25/2020: stopped Zytiga therapy after < 7 days due to worsening depression 10) 04/28/2020: received enzalutamide 160mg , did not take any doses 11) 06/15/2020: agreed to restart Zytiga 1000mg  daily with prednisone 5mg    Interval History:  [name] [name] 65 y.o. male with medical history significant for metastatic prostate cancer who presents for a follow up visit. The patient's last visit was on 06/15/2020. In the interim since the last visit [name] [name] has continued abiraterone therapy.   On exam today [name] [name] that his emotional state has improved considerably since our last visit.  He reports that he does occasionally have some hot flashes  and reports that it only lasts a few minutes when it happens.  He does report drinking lots of water and it makes him feel better.  He has not been taking the antidepressant prescribed to him and notes that he has been feeling better without that.  He has been trying to eat better and exercise and thinks that this might be helping with that emotional distress.  He notes that he is not having any pain today has not required any pain medication.  He is also not having any urinary symptoms.  He denies any fevers, chills, sweats, nausea, vomiting or diarrhea.  Full 10 point ROS is listed below.  MEDICAL HISTORY:  Past Medical History:  Diagnosis Date  . Brain tumor [facility])    diagnosed by MRI 6 months ago  . Cancer ([place])    Pt reports brain tumor 2 years ago  . Coronary artery disease 2011   s/p BMS stent LAD, [facility] [place] [place], Dr Chauncey Cruel. [name]  . Depression   . Dyspnea   . High cholesterol   . History of herniated intervertebral disc   . HTN (hypertension)   . Hyperlipidemia LDL goal <70 07/21/2013  . Lung nodule   . NSTEMI (non-ST elevated myocardial infarction) ([place]) 11/11/2017  . Smoker    1/2 ppd x >40 years     SURGICAL HISTORY: Past Surgical History:  Procedure Laterality Date  . CORONARY ANGIOPLASTY WITH STENT PLACEMENT Left 2011   BMS LAD, [facility] [place], [place], Dr Chauncey Cruel. [name]  . CORONARY STENT INTERVENTION N/A 11/12/2017   Procedure: CORONARY STENT INTERVENTION;  Surgeon: Leonie Man, MD;  Location: [place] [facility];  Service: Cardiovascular;  Laterality: N/A;  . LEFT HEART CATH AND CORONARY ANGIOGRAPHY N/A 11/12/2017   Procedure: LEFT HEART CATH AND CORONARY ANGIOGRAPHY;  Surgeon: Leonie Man, MD;  Location: [place] [facility];  Service: Cardiovascular;  Laterality: N/A;    SOCIAL HISTORY: Social History   Socioeconomic History  . Marital status: Single    Spouse name: Not on file  . Number of children: Not on file  . Years of  education: Not on file  . Highest education level: Not on file  Occupational History  . Occupation: unemployed  Tobacco Use  . Smoking status: Current Every Day Smoker    Packs/day: 0.25    Years: 45.00    Pack years: 11.25  . Smokeless tobacco: Never Used  Vaping Use  . Vaping Use: Never used  Substance and Sexual Activity  . Alcohol use: Yes    Alcohol/week: 75.0 standard drinks    Types: 75 Shots of liquor per week    Comment: 2 shots of liquor daily  . Drug use: Not Currently    Types: Marijuana    Comment: occasionally  . Sexual activity: Yes  Other Topics Concern  . Not on file  Social History Narrative   From [place]    Family lives in [place] (brother, sister and mother)    Unemployed    Drinks 3 (1/5)ths in 1 week   Social Determinants of Health   Financial Resource Strain: Low Risk   . Difficulty of Paying Living Expenses: Not very hard  Food Insecurity: No Food Insecurity  . Worried About Charity fundraiser in the Last Year: Never true  . Ran Out of Food in the Last Year: Never true  Transportation Needs: No Transportation Needs  . Lack of Transportation (Medical): No  . Lack of Transportation (Non-Medical): No  Physical Activity: Inactive  . Days of Exercise per Week: 0 days  . Minutes of Exercise per Session: 0 min  Stress: Stress Concern Present  . Feeling of Stress : To some extent  Social Connections: Socially Isolated  . Frequency of Communication with Friends and Family: Three times a week  . Frequency of Social Gatherings with Friends and Family: Three times a week  . Attends Religious Services: Never  . Active Member of Clubs or Organizations: No  . Attends Archivist Meetings: Never  . Marital Status: Never married  Human resources officer Violence: Not on file    FAMILY HISTORY: Family History  Problem Relation Age of Onset  . Diabetes Paternal Grandfather   . Hypertension Paternal Grandfather     ALLERGIES:  has No Known  Allergies.  MEDICATIONS:  Current Outpatient Medications  Medication Sig Dispense Refill  . aspirin 81 MG EC tablet Take 1 tablet (81 mg total) by mouth daily. 90 tablet 3  . atorvastatin (LIPITOR) 40 MG tablet Take 1 tablet (40 mg total) by mouth daily. 90 tablet 3  . calcium-vitamin D (OSCAL WITH D) 500-200 MG-UNIT tablet Take 1 tablet by mouth daily with breakfast. 90 tablet 3  . carvedilol (COREG) 6.25 MG tablet carvedilol 6.25 mg tablet  TAKE 1 TABLET BY MOUTH TWICE DAILY WITH A MEAL    . citalopram (CELEXA) 20 MG tablet Take 1 tablet (20 mg total) by mouth daily. (Patient not taking: Reported on 06/15/2020) 30 tablet 3  . clopidogrel (PLAVIX) 75 MG tablet Take 1 tablet (75 mg total) by mouth daily. 90 tablet 3  . gabapentin (NEURONTIN) 300 MG capsule  Take 300 mg by mouth 3 (three) times daily as needed for pain. (Patient not taking: Reported on 06/15/2020)    . lisinopril (ZESTRIL) 10 MG tablet Take 1 tablet (10 mg total) by mouth daily. 90 tablet 3  . nitroGLYCERIN (NITROSTAT) 0.4 MG SL tablet Place 1 tablet (0.4 mg total) under the tongue every 5 (five) minutes as needed for chest pain. 25 tablet 1  . oxyCODONE 10 MG TABS Take 0.5 tablets (5 mg total) by mouth every 4 (four) hours as needed for severe pain. (Patient not taking: Reported on 04/28/2020) 30 tablet 0  . predniSONE (DELTASONE) 5 MG tablet Take 1 tablet (5 mg total) by mouth daily with breakfast. 90 tablet 1   No current facility-administered medications for this visit.    REVIEW OF SYSTEMS:   Constitutional: ( - ) fevers, ( - )  chills , ( - ) night sweats Eyes: ( - ) blurriness of vision, ( - ) double vision, ( - ) watery eyes Ears, nose, mouth, throat, and face: ( - ) mucositis, ( - ) sore throat Respiratory: ( - ) cough, ( - ) dyspnea, ( - ) wheezes Cardiovascular: ( - ) palpitation, ( - ) chest discomfort, ( - ) lower extremity swelling Gastrointestinal:  ( - ) nausea, ( - ) heartburn, ( - ) change in bowel  habits Skin: ( - ) abnormal skin rashes Lymphatics: ( - ) new lymphadenopathy, ( - ) easy bruising Neurological: ( - ) numbness, ( - ) tingling, ( - ) new weaknesses Behavioral/Psych: ( - ) mood change, ( - ) new changes  All other systems were reviewed with the patient and are negative.  PHYSICAL EXAMINATION: ECOG PERFORMANCE STATUS: 1 - Symptomatic but completely ambulatory  Vitals:   07/14/20 1521  BP: (!) 166/74  Pulse: 89  Resp: 18  Temp: 98.7 F (37.1 C)  SpO2: 100%   Filed Weights   07/14/20 1521  Weight: 178 lb 12.8 oz (81.1 kg)    GENERAL:  Well appearing middle aged [place] American male. alert, no distress and comfortable SKIN: skin color, texture, turgor are normal, no rashes or significant lesions EYES: conjunctiva are pink and non-injected, sclera clear LUNGS: clear to auscultation and percussion with normal breathing effort HEART: regular rate & rhythm and no murmurs and no lower extremity edema Musculoskeletal: no cyanosis of digits and no clubbing  PSYCH: alert & oriented x 3, fluent speech NEURO: no focal motor/sensory deficits  LABORATORY DATA:  I have reviewed the data as listed CBC Latest Ref Rng & Units 07/14/2020 06/29/2020 06/15/2020  WBC 4.0 - 10.5 K/uL 7.6 7.7 7.1  Hemoglobin 13.0 - 17.0 g/dL 11.4(L) 11.2(L) 11.3(L)  Hematocrit 39.0 - 52.0 % 35.7(L) 35.4(L) 36.1(L)  Platelets 150 - 400 K/uL 158 192 217    CMP Latest Ref Rng & Units 07/14/2020 06/29/2020 06/15/2020  Glucose 70 - 99 mg/dL 115(H) 110(H) 125(H)  BUN 8 - 23 mg/dL 19 21 19   Creatinine 0.61 - 1.24 mg/dL 1.50(H) 1.62(H) 1.62(H)  Sodium 135 - 145 mmol/L 140 140 139  Potassium 3.5 - 5.1 mmol/L 3.7 4.1 4.1  Chloride 98 - 111 mmol/L 106 106 106  CO2 22 - 32 mmol/L 23 24 25   Calcium 8.9 - 10.3 mg/dL 9.7 10.0 10.4(H)  Total Protein 6.5 - 8.1 g/dL 8.4(H) 8.3(H) 8.6(H)  Total Bilirubin 0.3 - 1.2 mg/dL 0.2(L) 0.3 0.2(L)  Alkaline Phos 38 - 126 U/L 71 65 73  AST 15 - 41 U/L  20 22 19   ALT 0 -  44 U/L 24 24 24    RADIOGRAPHIC STUDIES: No results found.  ASSESSMENT & PLAN [name] [name] 65 y.o. male with medical history significant for metastatic prostate cancer who presents for a follow up visit.    Previously we discussed the treatment options moving forward including d/c of abiraterone and the addition of enzalutamide to his ADT therapy.  I discussed the side effects of both of these medications and the required follow-up.  After weighing the pros and cons the patient wanted to proceed with the enzalutamide therapy and stop taking the abiraterone therapy. However, he now reports he has never taken the enzalutamide as he is concerned about the side effects. Today he wanted to discuss the risks/benefits of both therapies in detail.   The prostate biopsy has been obtained and confirms adenocarcinoma of the prostate. We can began treating with abiraterone 1000 mg p.o. daily with 5 mg of prednisone, however he d/c this due to worsening anxiety/depression. He will continue his q. 3 monthly 22.5 mg Lupron shots (most recent shot was a 45mg  on 03/03/2020, next due March 2022).  Additionally testosterone and PSA levels will be rechecked q 3 months. There is no clear indication for bisphosphonate therapy in the setting of castrate sensitive prostate cancer with metastatic disease to the bone.  After discussion today he was agreeable to continuing Zytiga 1000 mg daily with prednisone.  We will set him up for every 2 week lab work and a 4-week follow-up for the first 3 months.  # Metastatic Castrate Sensitive Prostate Cancer  --repeat CT C/A/P Scan and NM bone scan due Jan 2022. Will repeat q 3 months initially.  --prostate biopsy performed by urology on 03/30/2020, confirms diagnosis of prostate adenocarcinoma. Gleason score not calculated due to ADT therapy  --Received dose of lupron 45mg  on 02/29/2020. Next due in March 2022. --calcium/vitamin D for bone protection on ADT therapy.  --continue  abiraterone 1000mg  PO daily with 5 mg prednisone.  --the literature does not currently support the use of bisphosphonates in the setting of CSPC.  --have patient RTC 4 weeks with an interval 2 week lab visit.   #Symptom management -- oxycodone 5mg  q4H PRN for pain. He has not required these as his pain is currently under control --encourage OTC senna for constipation prevention if taking opioids.  #Goals of Care --discussed the incurable nature of metastatic disease --emphasized that all treatments moving forward are focused on symptom control, decreasing tumor size/slow spread, and increasing lifespan.  --patient voiced understanding of the palliative nature of treatments moving forward.  --patients MPOA is Chanel Sarina Ill (his daughter)   No orders of the defined types were placed in this encounter.   All questions were answered. The patient knows to call the clinic with any problems, questions or concerns.  A total of more than 30 minutes were spent on this encounter and over half of that time was spent on counseling and coordination of care as outlined above.   Ledell Peoples, MD Department of Hematology/Oncology [place] at [facility] Phone: [phone] Pager: [phone] Email: [email or web address]  07/14/2020 3:43 PM   Literature Support:  Juanda Crumble A Systematic Review and Meta-Analysis about the Effect of Bisphosphonates on the Risk of Skeletal-Related Event in Men with Prostate Cancer. Anticancer Agents Med Chem. 2020;20(13):1604-1612.  --Our study demonstrated that bisphosphonates could not statistically significantly reduce the risk of SRE in patients with prostate cancer, neither  in the subgroups with M1 or CSPC.

## 2020-07-15 ENCOUNTER — Telehealth: Payer: Self-pay | Admitting: *Deleted

## 2020-07-15 LAB — PROSTATE-SPECIFIC AG, SERUM (LABCORP): Prostate Specific Ag, Serum: 0.3 ng/mL (ref 0.0–4.0)

## 2020-07-15 LAB — TESTOSTERONE: Testosterone: <3 ng/dL — ABNORMAL LOW (ref 264–916)

## 2020-07-15 NOTE — Telephone Encounter (Signed)
[name] patient regarding recent lab results. Spoke with patient and informed him that his PSA is down to 0.3 and his testosterone is undetectable. Advised that these are excellent results.  Pt very pleased with these rsults. He is aware of upcoming Bone Survey and CT scans. Awaiting approval and scheduling before h his 08/10/20 appts with [name]

## 2020-07-15 NOTE — Telephone Encounter (Signed)
-----   Message from Orson Slick, MD sent at 07/15/2020 10:28 AM EST ----- Please let [name] [name] know that his PSA is down to 0.3 and testosterone is undetectable. These are excellent results. He should be scheduled for a NM bone scan and CT C/A/P soon. We will see him back in [facility] February with an interval lab check in 2 weeks.   ----- Message ----- From: Buel Ream, Lab In [place] Sent: 07/14/2020   3:00 PM EST To: Orson Slick, MD

## 2020-07-18 ENCOUNTER — Telehealth: Payer: Self-pay | Admitting: Hematology and Oncology

## 2020-07-18 NOTE — Telephone Encounter (Signed)
Scheduled per 1/20 los. Pt will receive an updated appt calendar per next visit appt notes  

## 2020-07-27 ENCOUNTER — Other Ambulatory Visit: Payer: Medicaid Other

## 2020-08-01 ENCOUNTER — Encounter: Payer: Self-pay | Admitting: Hematology and Oncology

## 2020-08-01 ENCOUNTER — Telehealth: Payer: Self-pay | Admitting: *Deleted

## 2020-08-01 NOTE — Telephone Encounter (Signed)
[name] patient to discuss his scan appts for tomorrow, 08/02/20  It was very confusing for patient having CT scan in the middle of his Bone Scan. This nurse was able to re-schedule CT scan to 08/09/20 @ 3:30 pm. Contrast and instructions and calendar left at the front desk for pt to pick up tomorrow.  He is aware. Made his daughter aware as well.via My Chart

## 2020-08-02 ENCOUNTER — Ambulatory Visit (HOSPITAL_COMMUNITY)
Admission: RE | Admit: 2020-08-02 | Discharge: 2020-08-02 | Disposition: A | Discharge status: Home / Self Care | Payer: Medicaid Other | Source: Ambulatory Visit | Attending: Hematology and Oncology | Admitting: Hematology and Oncology

## 2020-08-02 ENCOUNTER — Ambulatory Visit (HOSPITAL_COMMUNITY): Payer: Medicaid Other

## 2020-08-02 ENCOUNTER — Other Ambulatory Visit: Payer: Self-pay

## 2020-08-02 ENCOUNTER — Inpatient Hospital Stay: Payer: Medicaid Other

## 2020-08-02 ENCOUNTER — Other Ambulatory Visit: Payer: Self-pay | Admitting: Hematology and Oncology

## 2020-08-02 DIAGNOSIS — C61 Malignant neoplasm of prostate: Secondary | ICD-10-CM | POA: Diagnosis not present

## 2020-08-02 MED ORDER — TECHNETIUM TC 99M MEDRONATE IV KIT
18.9000 | PACK | Freq: Once | INTRAVENOUS | Status: AC | PRN
  Administered 2020-08-02: 18.9 via INTRAVENOUS

## 2020-08-09 ENCOUNTER — Inpatient Hospital Stay: Payer: Medicaid Other | Attending: Hematology and Oncology

## 2020-08-09 ENCOUNTER — Encounter (HOSPITAL_COMMUNITY): Payer: Self-pay

## 2020-08-09 ENCOUNTER — Ambulatory Visit (HOSPITAL_COMMUNITY)
Admission: RE | Admit: 2020-08-09 | Discharge: 2020-08-09 | Disposition: A | Discharge status: Home / Self Care | Payer: Medicaid Other | Source: Ambulatory Visit | Attending: Hematology and Oncology | Admitting: Hematology and Oncology

## 2020-08-09 ENCOUNTER — Other Ambulatory Visit: Payer: Self-pay

## 2020-08-09 DIAGNOSIS — C7951 Secondary malignant neoplasm of bone: Secondary | ICD-10-CM | POA: Insufficient documentation

## 2020-08-09 DIAGNOSIS — F32A Depression, unspecified: Secondary | ICD-10-CM | POA: Diagnosis not present

## 2020-08-09 DIAGNOSIS — Z7952 Long term (current) use of systemic steroids: Secondary | ICD-10-CM | POA: Insufficient documentation

## 2020-08-09 DIAGNOSIS — F17210 Nicotine dependence, cigarettes, uncomplicated: Secondary | ICD-10-CM | POA: Insufficient documentation

## 2020-08-09 DIAGNOSIS — C61 Malignant neoplasm of prostate: Secondary | ICD-10-CM | POA: Insufficient documentation

## 2020-08-09 DIAGNOSIS — F419 Anxiety disorder, unspecified: Secondary | ICD-10-CM | POA: Diagnosis not present

## 2020-08-09 DIAGNOSIS — Z79899 Other long term (current) drug therapy: Secondary | ICD-10-CM | POA: Insufficient documentation

## 2020-08-09 LAB — CBC WITH DIFFERENTIAL (CANCER CENTER ONLY)
Abs Immature Granulocytes: 0.03 10*3/uL (ref 0.00–0.07)
Basophils Absolute: 0.1 10*3/uL (ref 0.0–0.1)
Basophils Relative: 1 %
Eosinophils Absolute: 0.2 10*3/uL (ref 0.0–0.5)
Eosinophils Relative: 2 %
HCT: 36.4 % — ABNORMAL LOW (ref 39.0–52.0)
Hemoglobin: 11.7 g/dL — ABNORMAL LOW (ref 13.0–17.0)
Immature Granulocytes: 0 %
Lymphocytes Relative: 34 %
Lymphs Abs: 2.5 10*3/uL (ref 0.7–4.0)
MCH: 28.2 pg (ref 26.0–34.0)
MCHC: 32.1 g/dL (ref 30.0–36.0)
MCV: 87.7 fL (ref 80.0–100.0)
Monocytes Absolute: 0.7 10*3/uL (ref 0.1–1.0)
Monocytes Relative: 9 %
Neutro Abs: 4 10*3/uL (ref 1.7–7.7)
Neutrophils Relative %: 54 %
Platelet Count: 214 10*3/uL (ref 150–400)
RBC: 4.15 MIL/uL — ABNORMAL LOW (ref 4.22–5.81)
RDW: 14 % (ref 11.5–15.5)
WBC Count: 7.4 10*3/uL (ref 4.0–10.5)
nRBC: 0 % (ref 0.0–0.2)

## 2020-08-09 LAB — CMP (CANCER CENTER ONLY)
ALT: 18 U/L (ref 0–44)
AST: 17 U/L (ref 15–41)
Albumin: 4.3 g/dL (ref 3.5–5.0)
Alkaline Phosphatase: 68 U/L (ref 38–126)
Anion gap: 10 (ref 5–15)
BUN: 17 mg/dL (ref 8–23)
CO2: 24 mmol/L (ref 22–32)
Calcium: 9.9 mg/dL (ref 8.9–10.3)
Chloride: 104 mmol/L (ref 98–111)
Creatinine: 1.65 mg/dL — ABNORMAL HIGH (ref 0.61–1.24)
GFR, Estimated: 47 mL/min — ABNORMAL LOW (ref >60)
Glucose, Bld: 101 mg/dL — ABNORMAL HIGH (ref 70–99)
Potassium: 4.3 mmol/L (ref 3.5–5.1)
Sodium: 138 mmol/L (ref 135–145)
Total Bilirubin: 0.2 mg/dL — ABNORMAL LOW (ref 0.3–1.2)
Total Protein: 8.4 g/dL — ABNORMAL HIGH (ref 6.5–8.1)

## 2020-08-09 MED ORDER — IOHEXOL 300 MG/ML  SOLN
75.0000 mL | Freq: Once | INTRAMUSCULAR | Status: AC | PRN
  Administered 2020-08-09: 75 mL via INTRAVENOUS

## 2020-08-10 ENCOUNTER — Other Ambulatory Visit: Payer: Self-pay | Admitting: Hematology and Oncology

## 2020-08-10 ENCOUNTER — Encounter: Payer: Self-pay | Admitting: Hematology and Oncology

## 2020-08-10 ENCOUNTER — Inpatient Hospital Stay (HOSPITAL_BASED_OUTPATIENT_CLINIC_OR_DEPARTMENT_OTHER): Payer: Medicaid Other | Admitting: Hematology and Oncology

## 2020-08-10 ENCOUNTER — Other Ambulatory Visit: Payer: Medicaid Other

## 2020-08-10 VITALS — BP 167/89 | HR 81 | Temp 97.8°F | Resp 16 | Ht 73.0 in | Wt 183.0 lb

## 2020-08-10 DIAGNOSIS — C61 Malignant neoplasm of prostate: Secondary | ICD-10-CM | POA: Diagnosis not present

## 2020-08-10 DIAGNOSIS — R590 Localized enlarged lymph nodes: Secondary | ICD-10-CM

## 2020-08-10 DIAGNOSIS — C7951 Secondary malignant neoplasm of bone: Secondary | ICD-10-CM | POA: Diagnosis not present

## 2020-08-10 MED ORDER — PREDNISONE 5 MG PO TABS: 5.0000 mg | ORAL_TABLET | Freq: Every day | ORAL | 1 refills | Status: DC

## 2020-08-10 MED ORDER — ABIRATERONE ACETATE 250 MG PO TABS: 1000.0000 mg | ORAL_TABLET | Freq: Every day | ORAL | 2 refills | Status: DC

## 2020-08-10 MED FILL — predniSONE 5 MG TABS: 5 | 34 days supply | Qty: 34 | Fill #0

## 2020-08-10 NOTE — Progress Notes (Signed)
[place] [phone]   [phone]  PROGRESS NOTE  Patient Care Team: Rosilyn Mings. as PCP - General Lorretta Harp, MD as PCP - Cardiology (Cardiology)  Hematological/Oncological History # Metastatic Castrate Sensitive Prostate Cancer 1) 10/13/2019: CXR in the ED for shortness of breath showed masslike soft tissue fullness within the right hilum concerning for right hilar lymphadenopathy 2) 10/13/2019: CT Chest W contrast showed mediastinal and bilateral hilar adenopathy, right greater than left. Seen by pulmonary with plans for PET and biopsy, though he was lost to follow up.  3) 01/17/2020: sent to the ED by Orthopedic surgery due to metastasis of the spine noted on MRI. IN the ED CT C/A/P showed extensive left pelvic and retroperitoneal lymphadenopathy. Mediastinal and bilateral hilar lymphadenopathy as well as mixed lytic and sclerotic lesions are noted in the bony pelvis 4) 01/17/2020: PSA noted to be 1074.94 5) 01/22/2020: establish care with [name]. Started Bicalutamide 50mg  PO daily. Plan for 28 days of therapy.   6) 02/05/2020: Lupron 22.5mg  started.  7) 03/03/2020: received 45mg  lupron with urology. PSA 9.6 8) 03/30/2020: prostate biopsy performed, confirmed adenocarcinoma of the prostate.  9) 04/25/2020: stopped Zytiga therapy after < 7 days due to worsening depression 10) 04/28/2020: received enzalutamide 160mg , did not take any doses 11) 06/15/2020: agreed to restart Zytiga 1000mg  daily with prednisone 5mg    Interval History:  [name] [name] 65 y.o. male with medical history significant for metastatic prostate cancer who presents for a follow up visit. The patient's last visit was on 07/14/2020. In the interim since the last visit [name] [name] has continued abiraterone therapy without difficulty.   On exam today [name] [name] reports that he has been well in the interim since our last visit.  He is happy that his weight is up to 183  pounds and he has not been having any new symptoms.  He denies any fevers, chills, sweats, nausea, vomiting or diarrhea.  He reports that his energy is good and his strength is good as well.  He is overall quite happy with the way that things are going at this time.  He notes that he is not having any pain and has not required any pain medication.  He is also not having any urinary symptoms.  He denies any fevers, chills, sweats, nausea, vomiting or diarrhea.  Full 10 point ROS is listed below.  MEDICAL HISTORY:  Past Medical History:  Diagnosis Date  . Brain tumor [facility])    diagnosed by MRI 6 months ago  . Cancer ([place])    Pt reports brain tumor 2 years ago  . Coronary artery disease 2011   s/p BMS stent LAD, [facility], [facility] [place] [place], Dr Chauncey Cruel. [name]  . Depression   . Dyspnea   . High cholesterol   . History of herniated intervertebral disc   . HTN (hypertension)   . Hyperlipidemia LDL goal <70 07/21/2013  . Lung nodule   . NSTEMI (non-ST elevated myocardial infarction) ([place]) 11/11/2017  . Smoker    1/2 ppd x >40 years     SURGICAL HISTORY: Past Surgical History:  Procedure Laterality Date  . CORONARY ANGIOPLASTY WITH STENT PLACEMENT Left 2011   BMS LAD, [facility], [facility] [place], [place], Dr Chauncey Cruel. [name]  . CORONARY STENT INTERVENTION N/A 11/12/2017   Procedure: CORONARY STENT INTERVENTION;  Surgeon: Leonie Man, MD;  Location: [facility];  Service: Cardiovascular;  Laterality: N/A;  . LEFT HEART CATH AND CORONARY  ANGIOGRAPHY N/A 11/12/2017   Procedure: LEFT HEART CATH AND CORONARY ANGIOGRAPHY;  Surgeon: Leonie Man, MD;  Location: [facility];  Service: Cardiovascular;  Laterality: N/A;    SOCIAL HISTORY: Social History   Socioeconomic History  . Marital status: Single    Spouse name: Not on file  . Number of children: Not on file  . Years of education: Not on file  . Highest education level: Not on file  Occupational History  .  Occupation: unemployed  Tobacco Use  . Smoking status: Current Every Day Smoker    Packs/day: 0.25    Years: 45.00    Pack years: 11.25  . Smokeless tobacco: Never Used  Vaping Use  . Vaping Use: Never used  Substance and Sexual Activity  . Alcohol use: Yes    Alcohol/week: 75.0 standard drinks    Types: 75 Shots of liquor per week    Comment: 2 shots of liquor daily  . Drug use: Not Currently    Types: Marijuana    Comment: occasionally  . Sexual activity: Yes  Other Topics Concern  . Not on file  Social History Narrative   From [place]    Family lives in [place] (brother, sister and mother)    Unemployed    Drinks 3 (1/5)ths in 1 week   Social Determinants of Health   Financial Resource Strain: Low Risk   . Difficulty of Paying Living Expenses: Not very hard  Food Insecurity: No Food Insecurity  . Worried About Charity fundraiser in the Last Year: Never true  . Ran Out of Food in the Last Year: Never true  Transportation Needs: No Transportation Needs  . Lack of Transportation (Medical): No  . Lack of Transportation (Non-Medical): No  Physical Activity: Inactive  . Days of Exercise per Week: 0 days  . Minutes of Exercise per Session: 0 min  Stress: Stress Concern Present  . Feeling of Stress : To some extent  Social Connections: Socially Isolated  . Frequency of Communication with Friends and Family: Three times a week  . Frequency of Social Gatherings with Friends and Family: Three times a week  . Attends Religious Services: Never  . Active Member of Clubs or Organizations: No  . Attends Archivist Meetings: Never  . Marital Status: Never married  Human resources officer Violence: Not on file    FAMILY HISTORY: Family History  Problem Relation Age of Onset  . Diabetes Paternal Grandfather   . Hypertension Paternal Grandfather     ALLERGIES:  has No Known Allergies.  MEDICATIONS:  Current Outpatient Medications  Medication Sig Dispense Refill   . abiraterone acetate (ZYTIGA) 250 MG tablet Take 4 tablets (1,000 mg total) by mouth daily. Take on an empty stomach 1 hour before or 2 hours after a meal 120 tablet 2  . aspirin 81 MG EC tablet Take 1 tablet (81 mg total) by mouth daily. 90 tablet 3  . atorvastatin (LIPITOR) 40 MG tablet Take 1 tablet (40 mg total) by mouth daily. 90 tablet 3  . calcium-vitamin D (OSCAL WITH D) 500-200 MG-UNIT tablet Take 1 tablet by mouth daily with breakfast. 90 tablet 3  . carvedilol (COREG) 6.25 MG tablet carvedilol 6.25 mg tablet  TAKE 1 TABLET BY MOUTH TWICE DAILY WITH A MEAL    . citalopram (CELEXA) 20 MG tablet Take 1 tablet (20 mg total) by mouth daily. (Patient not taking: Reported on 06/15/2020) 30 tablet 3  . clopidogrel (PLAVIX)  75 MG tablet Take 1 tablet (75 mg total) by mouth daily. 90 tablet 3  . gabapentin (NEURONTIN) 300 MG capsule Take 300 mg by mouth 3 (three) times daily as needed for pain. (Patient not taking: Reported on 06/15/2020)    . lisinopril (ZESTRIL) 10 MG tablet Take 1 tablet (10 mg total) by mouth daily. 90 tablet 3  . nitroGLYCERIN (NITROSTAT) 0.4 MG SL tablet Place 1 tablet (0.4 mg total) under the tongue every 5 (five) minutes as needed for chest pain. 25 tablet 1  . oxyCODONE 10 MG TABS Take 0.5 tablets (5 mg total) by mouth every 4 (four) hours as needed for severe pain. (Patient not taking: Reported on 04/28/2020) 30 tablet 0  . predniSONE (DELTASONE) 5 MG tablet Take 1 tablet (5 mg total) by mouth daily with breakfast. 90 tablet 1   No current facility-administered medications for this visit.    REVIEW OF SYSTEMS:   Constitutional: ( - ) fevers, ( - )  chills , ( - ) night sweats Eyes: ( - ) blurriness of vision, ( - ) double vision, ( - ) watery eyes Ears, nose, mouth, throat, and face: ( - ) mucositis, ( - ) sore throat Respiratory: ( - ) cough, ( - ) dyspnea, ( - ) wheezes Cardiovascular: ( - ) palpitation, ( - ) chest discomfort, ( - ) lower extremity  swelling Gastrointestinal:  ( - ) nausea, ( - ) heartburn, ( - ) change in bowel habits Skin: ( - ) abnormal skin rashes Lymphatics: ( - ) new lymphadenopathy, ( - ) easy bruising Neurological: ( - ) numbness, ( - ) tingling, ( - ) new weaknesses Behavioral/Psych: ( - ) mood change, ( - ) new changes  All other systems were reviewed with the patient and are negative.  PHYSICAL EXAMINATION: ECOG PERFORMANCE STATUS: 1 - Symptomatic but completely ambulatory  Vitals:   08/10/20 1447  BP: (!) 167/89  Pulse: 81  Resp: 16  Temp: 97.8 F (36.6 C)  SpO2: 100%   Filed Weights   08/10/20 1447  Weight: 183 lb (83 kg)    GENERAL:  Well appearing middle aged [place] American male. alert, no distress and comfortable SKIN: skin color, texture, turgor are normal, no rashes or significant lesions EYES: conjunctiva are pink and non-injected, sclera clear LUNGS: clear to auscultation and percussion with normal breathing effort HEART: regular rate & rhythm and no murmurs and no lower extremity edema Musculoskeletal: no cyanosis of digits and no clubbing  PSYCH: alert & oriented x 3, fluent speech NEURO: no focal motor/sensory deficits  LABORATORY DATA:  I have reviewed the data as listed CBC Latest Ref Rng & Units 08/09/2020 07/14/2020 06/29/2020  WBC 4.0 - 10.5 K/uL 7.4 7.6 7.7  Hemoglobin 13.0 - 17.0 g/dL 11.7(L) 11.4(L) 11.2(L)  Hematocrit 39.0 - 52.0 % 36.4(L) 35.7(L) 35.4(L)  Platelets 150 - 400 K/uL 214 158 192    CMP Latest Ref Rng & Units 08/09/2020 07/14/2020 06/29/2020  Glucose 70 - 99 mg/dL 101(H) 115(H) 110(H)  BUN 8 - 23 mg/dL 17 19 21   Creatinine 0.61 - 1.24 mg/dL 1.65(H) 1.50(H) 1.62(H)  Sodium 135 - 145 mmol/L 138 140 140  Potassium 3.5 - 5.1 mmol/L 4.3 3.7 4.1  Chloride 98 - 111 mmol/L 104 106 106  CO2 22 - 32 mmol/L 24 23 24   Calcium 8.9 - 10.3 mg/dL 9.9 9.7 10.0  Total Protein 6.5 - 8.1 g/dL 8.4(H) 8.4(H) 8.3(H)  Total Bilirubin 0.3 - 1.2  mg/dL 0.2(L) 0.2(L) 0.3  Alkaline  Phos 38 - 126 U/L 68 71 65  AST 15 - 41 U/L 17 20 22   ALT 0 - 44 U/L 18 24 24    RADIOGRAPHIC STUDIES: NM Bone Scan Whole Body  Result Date: 08/02/2020 CLINICAL DATA:  Prostate cancer EXAM: NUCLEAR MEDICINE WHOLE BODY BONE SCAN TECHNIQUE: Whole body anterior and posterior images were obtained approximately 3 hours after intravenous injection of radiopharmaceutical. RADIOPHARMACEUTICALS:  18.9 mCi Technetium-45m MDP IV COMPARISON:  None. FINDINGS: There is normal radiotracer uptake seen within the collecting system bladder. No abnormal radiotracer uptake seen throughout the axial and appendicular skeleton. IMPRESSION: No evidence of metastatic disease. Electronically Signed   By: Prudencio Pair M.D.   On: 08/02/2020 20:29    ASSESSMENT & PLAN [name] [name] 65 y.o. male with medical history significant for metastatic prostate cancer who presents for a follow up visit.    Previously we discussed the treatment options moving forward including d/c of abiraterone and the addition of enzalutamide to his ADT therapy.  I discussed the side effects of both of these medications and the required follow-up.  After weighing the pros and cons the patient wanted to proceed with the enzalutamide therapy and stop taking the abiraterone therapy. However, he now reports he has never taken the enzalutamide as he is concerned about the side effects. Today he wanted to discuss the risks/benefits of both therapies in detail.   The prostate biopsy has been obtained and confirms adenocarcinoma of the prostate. We can began treating with abiraterone 1000 mg p.o. daily with 5 mg of prednisone, however he d/c this due to worsening anxiety/depression. He will continue his q. 3 monthly 22.5 mg Lupron shots (most recent shot was a 45mg  on 03/03/2020, next due March 2022).  Additionally testosterone and PSA levels will be rechecked q 3 months. There is no clear indication for bisphosphonate therapy in the setting of castrate sensitive  prostate cancer with metastatic disease to the bone.  After discussion today he was agreeable to continuing Zytiga 1000 mg daily with prednisone.  We will set him up for every 2 week lab work and a 4-week follow-up for the first 3 months.  # Metastatic Castrate Sensitive Prostate Cancer  --repeat CT C/A/P Scan and NM bone scan due April 2022. Will repeat q 3 months initially.  --prostate biopsy performed by urology on 03/30/2020, confirms diagnosis of prostate adenocarcinoma. Gleason score not calculated due to ADT therapy  --Received dose of lupron 45mg  on 02/29/2020. Next due in March 2022. --continue calcium/vitamin D for bone protection on ADT therapy.  --continue abiraterone 1000mg  PO daily with 5 mg prednisone.  --the literature does not currently support the use of bisphosphonates in the setting of CSPC.  --have patient RTC 4 weeks with an interval 2 week lab visit.   #Symptom management -- oxycodone 5mg  q4H PRN for pain. He has not required these as his pain is currently under control --encourage OTC senna for constipation prevention if taking opioids.  #Goals of Care --discussed the incurable nature of metastatic disease --emphasized that all treatments moving forward are focused on symptom control, decreasing tumor size/slow spread, and increasing lifespan.  --patient voiced understanding of the palliative nature of treatments moving forward.  --patients MPOA is Chanel Sarina Ill (his daughter)   No orders of the defined types were placed in this encounter.   All questions were answered. The patient knows to call the clinic with any problems, questions or concerns.  A total of  more than 30 minutes were spent on this encounter and over half of that time was spent on counseling and coordination of care as outlined above.   Ledell Peoples, MD Department of Hematology/Oncology [facility] Phone: [phone] Pager: [phone] Email:  [email or web address]  08/10/2020 3:10 PM   Literature Support:  Juanda Crumble A Systematic Review and Meta-Analysis about the Effect of Bisphosphonates on the Risk of Skeletal-Related Event in Men with Prostate Cancer. Anticancer Agents Med Chem. 2020;20(13):1604-1612.  --Our study demonstrated that bisphosphonates could not statistically significantly reduce the risk of SRE in patients with prostate cancer, neither in the subgroups with M1 or CSPC.

## 2020-08-17 MED FILL — ABIRATERONE ACETATE 250 MG: 250 | 30 days supply | Qty: 120 | Fill #0

## 2020-08-25 ENCOUNTER — Other Ambulatory Visit: Payer: Self-pay

## 2020-08-25 ENCOUNTER — Inpatient Hospital Stay: Payer: Medicaid Other | Attending: Hematology and Oncology

## 2020-08-25 DIAGNOSIS — I10 Essential (primary) hypertension: Secondary | ICD-10-CM | POA: Insufficient documentation

## 2020-08-25 DIAGNOSIS — E785 Hyperlipidemia, unspecified: Secondary | ICD-10-CM | POA: Diagnosis not present

## 2020-08-25 DIAGNOSIS — I2510 Atherosclerotic heart disease of native coronary artery without angina pectoris: Secondary | ICD-10-CM | POA: Insufficient documentation

## 2020-08-25 DIAGNOSIS — R590 Localized enlarged lymph nodes: Secondary | ICD-10-CM | POA: Diagnosis not present

## 2020-08-25 DIAGNOSIS — R200 Anesthesia of skin: Secondary | ICD-10-CM | POA: Diagnosis not present

## 2020-08-25 DIAGNOSIS — K769 Liver disease, unspecified: Secondary | ICD-10-CM | POA: Diagnosis not present

## 2020-08-25 DIAGNOSIS — Z8249 Family history of ischemic heart disease and other diseases of the circulatory system: Secondary | ICD-10-CM | POA: Insufficient documentation

## 2020-08-25 DIAGNOSIS — F17210 Nicotine dependence, cigarettes, uncomplicated: Secondary | ICD-10-CM | POA: Insufficient documentation

## 2020-08-25 DIAGNOSIS — Z833 Family history of diabetes mellitus: Secondary | ICD-10-CM | POA: Insufficient documentation

## 2020-08-25 DIAGNOSIS — Z79899 Other long term (current) drug therapy: Secondary | ICD-10-CM | POA: Insufficient documentation

## 2020-08-25 DIAGNOSIS — C61 Malignant neoplasm of prostate: Secondary | ICD-10-CM | POA: Insufficient documentation

## 2020-08-25 DIAGNOSIS — R918 Other nonspecific abnormal finding of lung field: Secondary | ICD-10-CM | POA: Insufficient documentation

## 2020-08-25 DIAGNOSIS — Z5111 Encounter for antineoplastic chemotherapy: Secondary | ICD-10-CM | POA: Insufficient documentation

## 2020-08-25 DIAGNOSIS — C7951 Secondary malignant neoplasm of bone: Secondary | ICD-10-CM | POA: Insufficient documentation

## 2020-08-25 DIAGNOSIS — Z7952 Long term (current) use of systemic steroids: Secondary | ICD-10-CM | POA: Diagnosis not present

## 2020-08-25 DIAGNOSIS — I700 Atherosclerosis of aorta: Secondary | ICD-10-CM | POA: Diagnosis not present

## 2020-08-25 DIAGNOSIS — I252 Old myocardial infarction: Secondary | ICD-10-CM | POA: Insufficient documentation

## 2020-08-25 DIAGNOSIS — Z560 Unemployment, unspecified: Secondary | ICD-10-CM | POA: Insufficient documentation

## 2020-08-25 DIAGNOSIS — Z7289 Other problems related to lifestyle: Secondary | ICD-10-CM | POA: Diagnosis not present

## 2020-08-25 DIAGNOSIS — N400 Enlarged prostate without lower urinary tract symptoms: Secondary | ICD-10-CM | POA: Diagnosis not present

## 2020-08-25 LAB — CMP (CANCER CENTER ONLY)
ALT: 17 U/L (ref 0–44)
AST: 17 U/L (ref 15–41)
Albumin: 4 g/dL (ref 3.5–5.0)
Alkaline Phosphatase: 70 U/L (ref 38–126)
Anion gap: 10 (ref 5–15)
BUN: 26 mg/dL — ABNORMAL HIGH (ref 8–23)
CO2: 23 mmol/L (ref 22–32)
Calcium: 9.6 mg/dL (ref 8.9–10.3)
Chloride: 107 mmol/L (ref 98–111)
Creatinine: 1.49 mg/dL — ABNORMAL HIGH (ref 0.61–1.24)
GFR, Estimated: 53 mL/min — ABNORMAL LOW (ref >60)
Glucose, Bld: 123 mg/dL — ABNORMAL HIGH (ref 70–99)
Potassium: 3.9 mmol/L (ref 3.5–5.1)
Sodium: 140 mmol/L (ref 135–145)
Total Bilirubin: 0.2 mg/dL — ABNORMAL LOW (ref 0.3–1.2)
Total Protein: 7.8 g/dL (ref 6.5–8.1)

## 2020-08-25 LAB — CBC WITH DIFFERENTIAL (CANCER CENTER ONLY)
Abs Immature Granulocytes: 0.02 10*3/uL (ref 0.00–0.07)
Basophils Absolute: 0 10*3/uL (ref 0.0–0.1)
Basophils Relative: 1 %
Eosinophils Absolute: 0.1 10*3/uL (ref 0.0–0.5)
Eosinophils Relative: 1 %
HCT: 34.1 % — ABNORMAL LOW (ref 39.0–52.0)
Hemoglobin: 10.9 g/dL — ABNORMAL LOW (ref 13.0–17.0)
Immature Granulocytes: 0 %
Lymphocytes Relative: 24 %
Lymphs Abs: 1.6 10*3/uL (ref 0.7–4.0)
MCH: 28 pg (ref 26.0–34.0)
MCHC: 32 g/dL (ref 30.0–36.0)
MCV: 87.7 fL (ref 80.0–100.0)
Monocytes Absolute: 0.5 10*3/uL (ref 0.1–1.0)
Monocytes Relative: 8 %
Neutro Abs: 4.4 10*3/uL (ref 1.7–7.7)
Neutrophils Relative %: 66 %
Platelet Count: 169 10*3/uL (ref 150–400)
RBC: 3.89 MIL/uL — ABNORMAL LOW (ref 4.22–5.81)
RDW: 14.3 % (ref 11.5–15.5)
WBC Count: 6.7 10*3/uL (ref 4.0–10.5)
nRBC: 0 % (ref 0.0–0.2)

## 2020-08-30 IMAGING — CT CT ABD-PELV W/ CM
2 of 5 series · 11 of 36 positions shown, 13 images · IV contrast (omnipaque)
Comparison: Chest CT 10/13/2019. No prior CT the abdomen and
pelvis.

CLINICAL DATA: 64-year-old male with history of unknown primary
malignancy.

EXAM:
CT CHEST, ABDOMEN, AND PELVIS WITH CONTRAST
TECHNIQUE: Multidetector CT imaging of the chest, abdomen and pelvis was
performed following the standard protocol during bolus
administration of intravenous contrast.
CONTRAST:  100mL OMNIPAQUE IOHEXOL 300 MG/ML  SOLN

[Series 3: cap with · axial · 0.82mm/px · z∈[-504,+61]mm · 8 of 143 slices shown, 10 images]
[im 15/143  mediastinal]
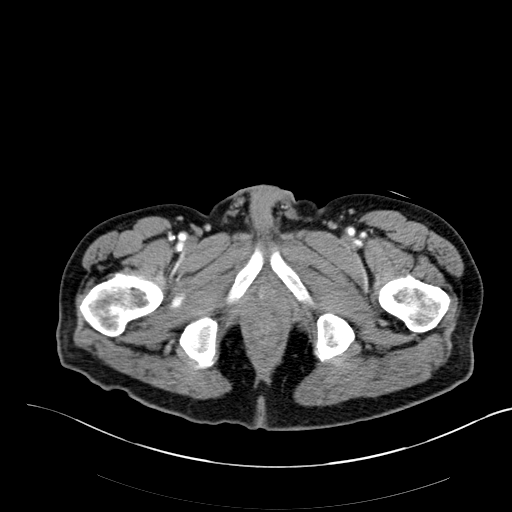
[im 15/143  lung]
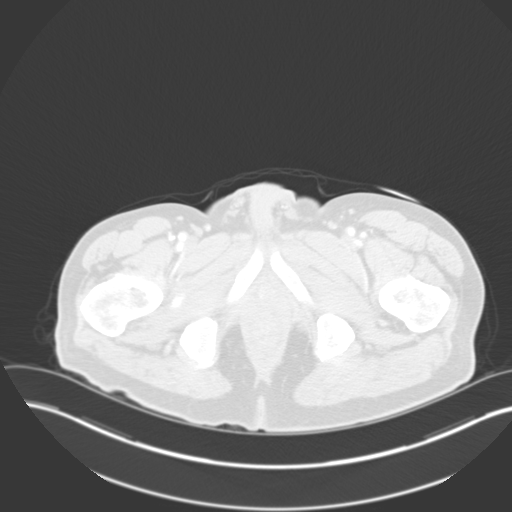
[im 29/143  lung]
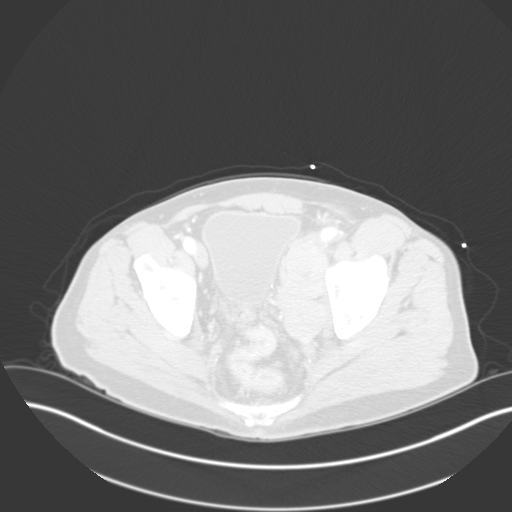
[im 43/143  lung]
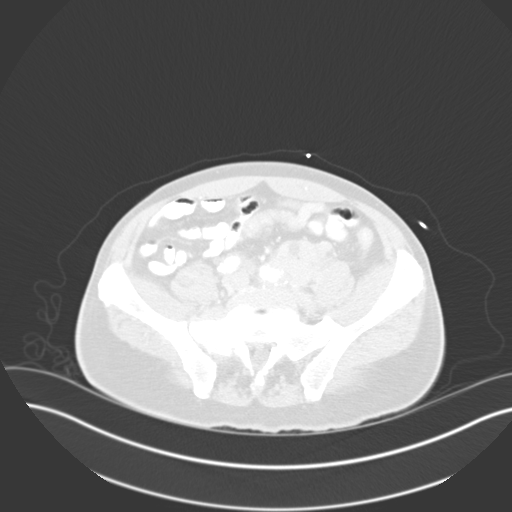
[im 57/143  lung]
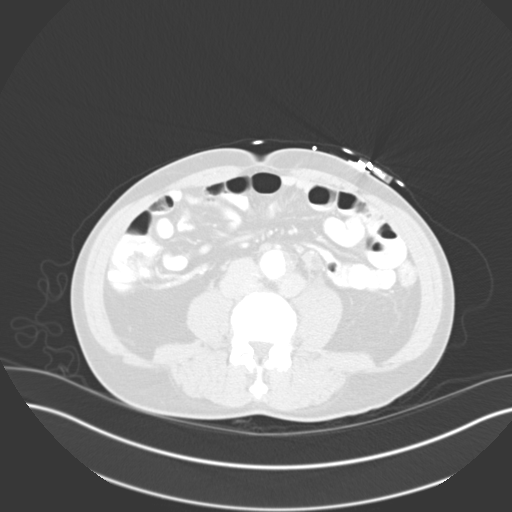
[im 86/143  mediastinal]
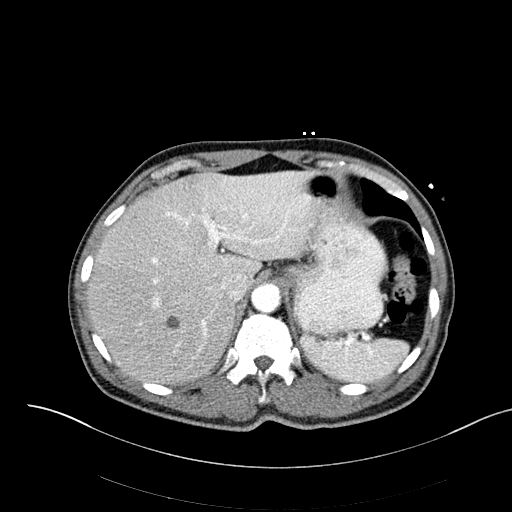
[im 86/143  lung]
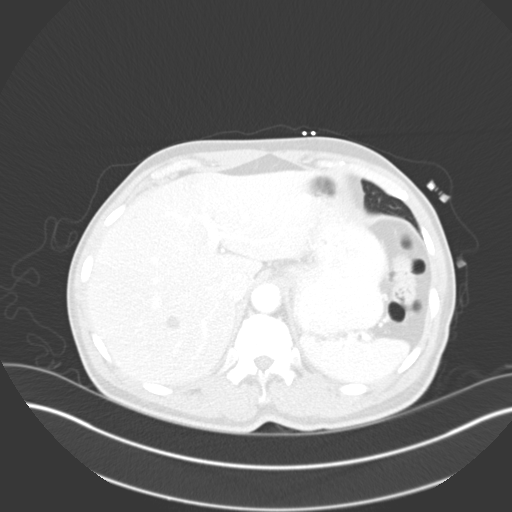
[im 100/143  lung]
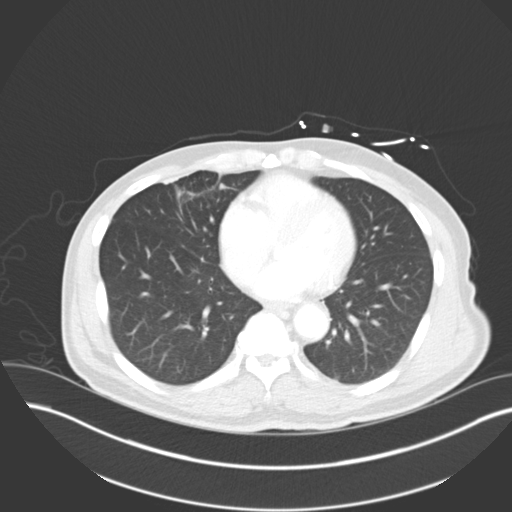
[im 114/143  lung]
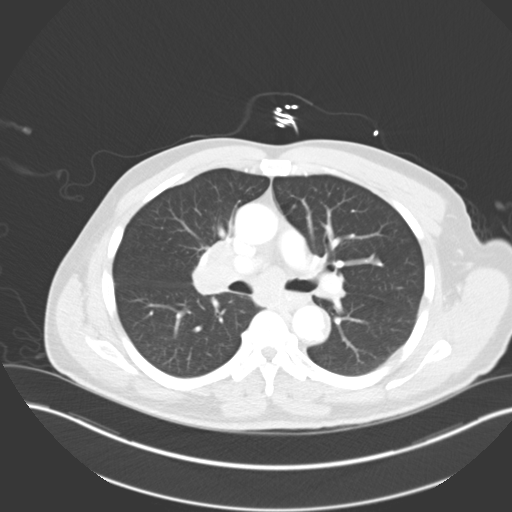
[im 128/143  lung]
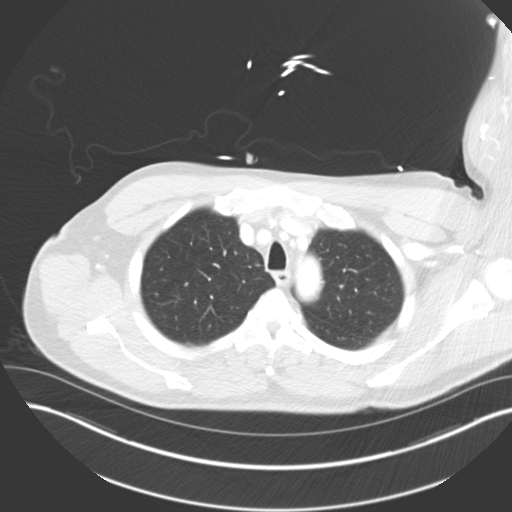

[Series 4: coronals · coronal · 0.83mm/px · 3 of 118 slices shown]
[im 24/118  lung]
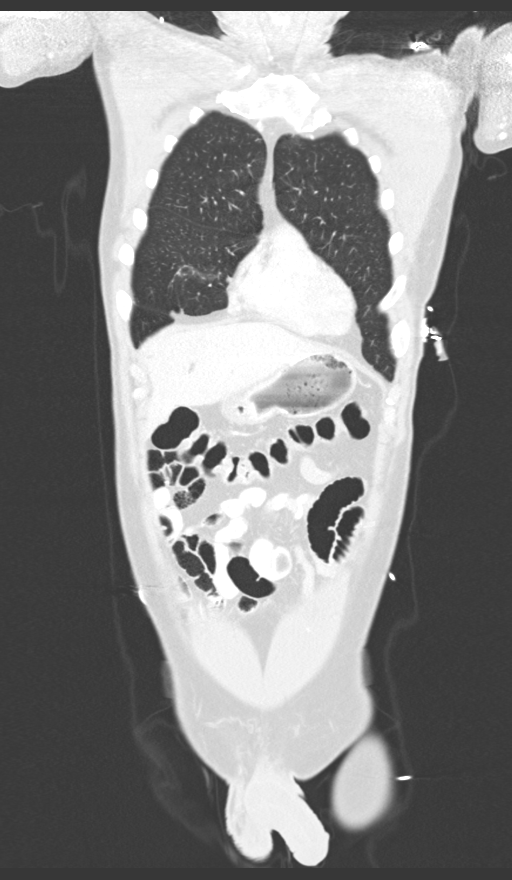
[im 47/118  lung]
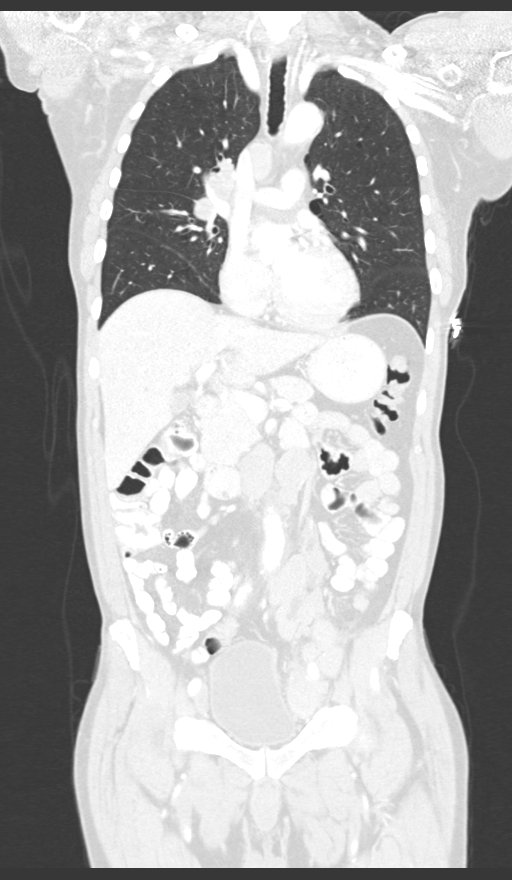
[im 71/118  lung]
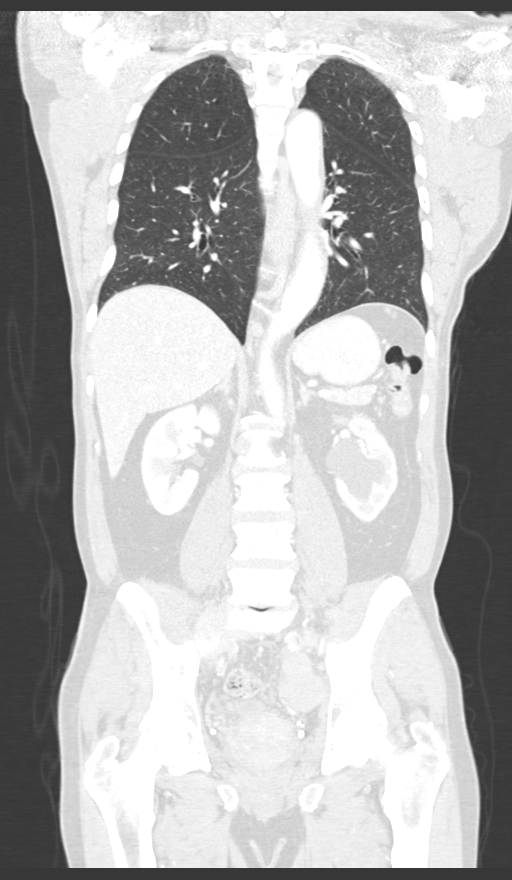

[11 of 36 positions shown; findings below may reference images not displayed]

FINDINGS: CT CHEST FINDINGS

Cardiovascular: Heart size is normal. There is no significant
pericardial fluid, thickening or pericardial calcification. There is
aortic atherosclerosis, as well as atherosclerosis of the great
vessels of the mediastinum and the coronary arteries, including
calcified atherosclerotic plaque in the left anterior descending,
left circumflex and right coronary arteries. Stents are present in
the left anterior descending and left circumflex coronary arteries.

Mediastinum/Nodes: Multiple enlarged mediastinal and bilateral hilar
lymph nodes measuring up to 2.3 cm in short axis in the subcarinal
nodal station (previously 2.1 cm), 2.5 cm in short axis in the right
hilar nodal station (previously 2.8 cm), 1.6 cm in short axis in the
left hilar nodal station (previously 1.4 cm), and 1.7 cm in short
axis in the low right paratracheal nodal station (previously
cm). Esophagus is unremarkable in appearance. No axillary
lymphadenopathy.

Lungs/Pleura: Multiple small pulmonary nodules are again noted
throughout the lungs bilaterally, generally similar in size, number
and distribution to the prior examination, largest of which is in
the lateral aspect of the right lower lobe (axial image 93 of series
7) measuring 10 x 8 mm (mean diameter of 9 mm). Mild chronic
scarring in the right middle lobe. No acute consolidative airspace
disease. No pleural effusions. Diffuse bronchial wall thickening
with mild centrilobular and paraseptal emphysema.

Musculoskeletal: There are no aggressive appearing lytic or blastic
lesions noted in the visualized portions of the skeleton.

CT ABDOMEN PELVIS FINDINGS

Hepatobiliary: Several low-attenuation lesions are again noted
scattered throughout the hepatic parenchyma, largest of which
measures only 1.1 cm in diameter, compatible with simple cysts.
Other subcentimeter low-attenuation lesions in the liver, too small
to characterize, but favored to represent tiny cysts. No definite
suspicious hepatic lesions. No intra or extrahepatic biliary ductal
dilatation. Gallbladder is normal in appearance.

Pancreas: No pancreatic mass. No pancreatic ductal dilatation. No
pancreatic or peripancreatic fluid collections or inflammatory
changes.

Spleen: Unremarkable.

Adrenals/Urinary Tract: Moderate left hydroureteronephrosis. There
is abrupt cut off of the left ureter at the junction of middle and
distal thirds, presumably related to bulky left retroperitoneal
lymphadenopathy. Right kidney and bilateral adrenal glands are
normal in appearance. No right hydroureteronephrosis. Urinary
bladder is unremarkable in appearance.

Stomach/Bowel: The appearance of the stomach is normal. There is no
pathologic dilatation of small bowel or colon. Normal appendix.

Vascular/Lymphatic: Aortic atherosclerosis, without evidence of
aneurysm or dissection in the abdominal or pelvic vasculature.
Ulcerated plaque in the infrarenal abdominal aorta (axial image 88
of series 3). Extensive lymphadenopathy noted in the retroperitoneum
and along the left pelvic sidewall. This is well demonstrated on
axial image 77 of series 3 where a left para-aortic lymph node
measures up to 2.9 cm in short axis. The largest pelvic nodal mass
measures 4.4 x 6.4 cm in the left external iliac nodal chain (axial
image 113 of series 3).

Reproductive: Prostate gland is enlarged measuring 5.7 x 5.7 x
cm. Seminal vesicles are unremarkable in appearance. Right-sided
hydrocele incompletely imaged.

Other: No significant volume of ascites.  No pneumoperitoneum.

Musculoskeletal: Multiple mixed lytic and sclerotic lesions are
noted in the bony pelvis, presumably reflective of multifocal
metastatic disease. The largest of these is in the right-side of the
sacrum measuring up to 7.4 x 5.1 cm (axial image 102 of series 3).
IMPRESSION: 1. Extensive left pelvic and retroperitoneal lymphadenopathy
concerning for lymphoma. However, the possibility of a primary
testicular malignancy is not excluded. Correlation with testicular
ultrasound should be considered if clinically appropriate.
2. Mediastinal and bilateral hilar lymphadenopathy redemonstrated,
with minimal change compared to the prior examination, as above.
3. Previously noted small pulmonary nodules appear centrally stable
compared to the prior examination.
4. Small right-sided hydrocele.
5. Aortic atherosclerosis, in addition to 3 vessel coronary artery
disease. Please note that although the presence of coronary artery
calcium documents the presence of coronary artery disease, the
severity of this disease and any potential stenosis cannot be
assessed on this non-gated CT examination. Assessment for potential
risk factor modification, dietary therapy or pharmacologic therapy
may be warranted, if clinically indicated.
6. Mild diffuse bronchial wall thickening with mild centrilobular
and paraseptal emphysema; imaging findings suggestive of underlying
COPD.
7. Additional incidental findings, as above.

## 2020-09-08 ENCOUNTER — Inpatient Hospital Stay (HOSPITAL_BASED_OUTPATIENT_CLINIC_OR_DEPARTMENT_OTHER): Payer: Medicaid Other | Admitting: Hematology and Oncology

## 2020-09-08 ENCOUNTER — Encounter: Payer: Self-pay | Admitting: Hematology and Oncology

## 2020-09-08 ENCOUNTER — Other Ambulatory Visit: Payer: Self-pay

## 2020-09-08 ENCOUNTER — Inpatient Hospital Stay: Payer: Medicaid Other

## 2020-09-08 VITALS — BP 147/89 | HR 70 | Temp 97.6°F | Resp 16 | Ht 73.0 in | Wt 183.2 lb

## 2020-09-08 DIAGNOSIS — C61 Malignant neoplasm of prostate: Secondary | ICD-10-CM

## 2020-09-08 DIAGNOSIS — C7951 Secondary malignant neoplasm of bone: Secondary | ICD-10-CM

## 2020-09-08 DIAGNOSIS — Z5111 Encounter for antineoplastic chemotherapy: Secondary | ICD-10-CM | POA: Diagnosis not present

## 2020-09-08 DIAGNOSIS — R590 Localized enlarged lymph nodes: Secondary | ICD-10-CM | POA: Diagnosis not present

## 2020-09-08 LAB — CMP (CANCER CENTER ONLY)
ALT: 16 U/L (ref 0–44)
AST: 14 U/L — ABNORMAL LOW (ref 15–41)
Albumin: 4 g/dL (ref 3.5–5.0)
Alkaline Phosphatase: 70 U/L (ref 38–126)
Anion gap: 6 (ref 5–15)
BUN: 21 mg/dL (ref 8–23)
CO2: 27 mmol/L (ref 22–32)
Calcium: 9.7 mg/dL (ref 8.9–10.3)
Chloride: 106 mmol/L (ref 98–111)
Creatinine: 1.51 mg/dL — ABNORMAL HIGH (ref 0.61–1.24)
GFR, Estimated: 52 mL/min — ABNORMAL LOW (ref >60)
Glucose, Bld: 123 mg/dL — ABNORMAL HIGH (ref 70–99)
Potassium: 4.1 mmol/L (ref 3.5–5.1)
Sodium: 139 mmol/L (ref 135–145)
Total Bilirubin: <0.2 mg/dL — ABNORMAL LOW (ref 0.3–1.2)
Total Protein: 8 g/dL (ref 6.5–8.1)

## 2020-09-08 LAB — CBC WITH DIFFERENTIAL (CANCER CENTER ONLY)
Abs Immature Granulocytes: 0.03 10*3/uL (ref 0.00–0.07)
Basophils Absolute: 0 10*3/uL (ref 0.0–0.1)
Basophils Relative: 1 %
Eosinophils Absolute: 0.1 10*3/uL (ref 0.0–0.5)
Eosinophils Relative: 2 %
HCT: 34.9 % — ABNORMAL LOW (ref 39.0–52.0)
Hemoglobin: 11.1 g/dL — ABNORMAL LOW (ref 13.0–17.0)
Immature Granulocytes: 0 %
Lymphocytes Relative: 28 %
Lymphs Abs: 2 10*3/uL (ref 0.7–4.0)
MCH: 28.1 pg (ref 26.0–34.0)
MCHC: 31.8 g/dL (ref 30.0–36.0)
MCV: 88.4 fL (ref 80.0–100.0)
Monocytes Absolute: 0.6 10*3/uL (ref 0.1–1.0)
Monocytes Relative: 8 %
Neutro Abs: 4.6 10*3/uL (ref 1.7–7.7)
Neutrophils Relative %: 61 %
Platelet Count: 164 10*3/uL (ref 150–400)
RBC: 3.95 MIL/uL — ABNORMAL LOW (ref 4.22–5.81)
RDW: 14.2 % (ref 11.5–15.5)
WBC Count: 7.4 10*3/uL (ref 4.0–10.5)
nRBC: 0 % (ref 0.0–0.2)

## 2020-09-08 MED ORDER — LEUPROLIDE ACETATE (3 MONTH) 22.5 MG ~~LOC~~ KIT
22.5000 mg | PACK | Freq: Once | SUBCUTANEOUS | Status: AC
  Administered 2020-09-08: 22.5 mg via SUBCUTANEOUS

## 2020-09-08 MED ORDER — LEUPROLIDE ACETATE (3 MONTH) 22.5 MG IM KIT: 22.5000 mg | PACK | Freq: Once | INTRAMUSCULAR | Status: DC

## 2020-09-08 MED ORDER — LEUPROLIDE ACETATE (3 MONTH) 22.5 MG ~~LOC~~ KIT
PACK | SUBCUTANEOUS | Status: AC
  Filled 2020-09-08: qty 22.5

## 2020-09-08 NOTE — Progress Notes (Signed)
[name] [name] [phone]   [phone]  PROGRESS NOTE  Patient Care Team: Rosilyn Mings. as PCP - General Lorretta Harp, MD as PCP - Cardiology (Cardiology)  Hematological/Oncological History # Metastatic Castrate Sensitive Prostate Cancer 1) 10/13/2019: CXR in the ED for shortness of breath showed masslike soft tissue fullness within the right hilum concerning for right hilar lymphadenopathy 2) 10/13/2019: CT Chest W contrast showed mediastinal and bilateral hilar adenopathy, right greater than left. Seen by pulmonary with plans for PET and biopsy, though he was lost to follow up.  3) 01/17/2020: sent to the ED by Orthopedic surgery due to metastasis of the spine noted on MRI. IN the ED CT C/A/P showed extensive left pelvic and retroperitoneal lymphadenopathy. Mediastinal and bilateral hilar lymphadenopathy as well as mixed lytic and sclerotic lesions are noted in the bony pelvis 4) 01/17/2020: PSA noted to be 1074.94 5) 01/22/2020: establish care with [name]. Started Bicalutamide 50mg  PO daily. Plan for 28 days of therapy.   6) 02/05/2020: Lupron 22.5mg  started.  7) 03/03/2020: received 45mg  lupron with urology. PSA 9.6 8) 03/30/2020: prostate biopsy performed, confirmed adenocarcinoma of the prostate.  9) 04/25/2020: stopped Zytiga therapy after < 7 days due to worsening depression 10) 04/28/2020: received enzalutamide 160mg , did not take any doses 11) 06/15/2020: agreed to restart Zytiga 1000mg  daily with prednisone 5mg   12) 09/08/2020: Lupron 22.5mg  shot.   Interval History:  [name] [name] 65 y.o. male with medical history significant for metastatic prostate cancer who presents for a follow up visit. The patient's last visit was on 08/10/2020. In the interim since the last visit [name] [name] has continued abiraterone therapy without difficulty.   On exam today [name] [name] reports that he has been well in the interim since her last visit with the  exception of some fatigue.  He notes he feels "wiped out".  Anything this may be caused by the Zytiga.  He is requesting take Zytiga medication at night.  He is not having any frank pain, however he does endorse having some occasional numbness in his right foot.  His appetite is good and his weight has been stable.  He denies having any other questions or concerns at this time. He is also not having any urinary symptoms.  He denies any fevers, chills, sweats, nausea, vomiting or diarrhea.  Full 10 point ROS is listed below.  MEDICAL HISTORY:  Past Medical History:  Diagnosis Date  . Brain tumor [facility])    diagnosed by MRI 6 months ago  . Cancer ([place])    Pt reports brain tumor 2 years ago  . Coronary artery disease 2011   s/p BMS stent LAD, [facility], [facility] [place], [place], Dr Chauncey Cruel. [name]  . Depression   . Dyspnea   . High cholesterol   . History of herniated intervertebral disc   . HTN (hypertension)   . Hyperlipidemia LDL goal <70 07/21/2013  . Lung nodule   . NSTEMI (non-ST elevated myocardial infarction) ([place]) 11/11/2017  . Smoker    1/2 ppd x >40 years     SURGICAL HISTORY: Past Surgical History:  Procedure Laterality Date  . CORONARY ANGIOPLASTY WITH STENT PLACEMENT Left 2011   BMS LAD, [facility], [facility] [place], [place], Dr Chauncey Cruel. [name]  . CORONARY STENT INTERVENTION N/A 11/12/2017   Procedure: CORONARY STENT INTERVENTION;  Surgeon: Leonie Man, MD;  Location: [place] [facility];  Service: Cardiovascular;  Laterality: N/A;  . LEFT HEART CATH AND CORONARY ANGIOGRAPHY  N/A 11/12/2017   Procedure: LEFT HEART CATH AND CORONARY ANGIOGRAPHY;  Surgeon: Leonie Man, MD;  Location: [place] [facility];  Service: Cardiovascular;  Laterality: N/A;    SOCIAL HISTORY: Social History   Socioeconomic History  . Marital status: Single    Spouse name: Not on file  . Number of children: Not on file  . Years of education: Not on file  . Highest education level: Not on file   Occupational History  . Occupation: unemployed  Tobacco Use  . Smoking status: Current Every Day Smoker    Packs/day: 0.25    Years: 45.00    Pack years: 11.25  . Smokeless tobacco: Never Used  Vaping Use  . Vaping Use: Never used  Substance and Sexual Activity  . Alcohol use: Yes    Alcohol/week: 75.0 standard drinks    Types: 75 Shots of liquor per week    Comment: 2 shots of liquor daily  . Drug use: Not Currently    Types: Marijuana    Comment: occasionally  . Sexual activity: Yes  Other Topics Concern  . Not on file  Social History Narrative   From [place]    Family lives in [place] (brother, sister and mother)    Unemployed    Drinks 3 (1/5)ths in 1 week   Social Determinants of Health   Financial Resource Strain: Low Risk   . Difficulty of Paying Living Expenses: Not very hard  Food Insecurity: No Food Insecurity  . Worried About Charity fundraiser in the Last Year: Never true  . Ran Out of Food in the Last Year: Never true  Transportation Needs: No Transportation Needs  . Lack of Transportation (Medical): No  . Lack of Transportation (Non-Medical): No  Physical Activity: Inactive  . Days of Exercise per Week: 0 days  . Minutes of Exercise per Session: 0 min  Stress: Stress Concern Present  . Feeling of Stress : To some extent  Social Connections: Socially Isolated  . Frequency of Communication with Friends and Family: Three times a week  . Frequency of Social Gatherings with Friends and Family: Three times a week  . Attends Religious Services: Never  . Active Member of Clubs or Organizations: No  . Attends Archivist Meetings: Never  . Marital Status: Never married  Human resources officer Violence: Not on file    FAMILY HISTORY: Family History  Problem Relation Age of Onset  . Diabetes Paternal Grandfather   . Hypertension Paternal Grandfather     ALLERGIES:  has No Known Allergies.  MEDICATIONS:  Current Outpatient Medications   Medication Sig Dispense Refill  . abiraterone acetate (ZYTIGA) 250 MG tablet Take 4 tablets (1,000 mg total) by mouth daily. Take on an empty stomach 1 hour before or 2 hours after a meal 120 tablet 2  . aspirin 81 MG EC tablet Take 1 tablet (81 mg total) by mouth daily. 90 tablet 3  . atorvastatin (LIPITOR) 40 MG tablet Take 1 tablet (40 mg total) by mouth daily. 90 tablet 3  . calcium-vitamin D (OSCAL WITH D) 500-200 MG-UNIT tablet Take 1 tablet by mouth daily with breakfast. 90 tablet 3  . carvedilol (COREG) 6.25 MG tablet carvedilol 6.25 mg tablet  TAKE 1 TABLET BY MOUTH TWICE DAILY WITH A MEAL    . citalopram (CELEXA) 20 MG tablet Take 1 tablet (20 mg total) by mouth daily. (Patient not taking: Reported on 06/15/2020) 30 tablet 3  . clopidogrel (PLAVIX) 75  MG tablet Take 1 tablet (75 mg total) by mouth daily. 90 tablet 3  . gabapentin (NEURONTIN) 300 MG capsule Take 300 mg by mouth 3 (three) times daily as needed for pain. (Patient not taking: Reported on 06/15/2020)    . lisinopril (ZESTRIL) 10 MG tablet Take 1 tablet (10 mg total) by mouth daily. 90 tablet 3  . nitroGLYCERIN (NITROSTAT) 0.4 MG SL tablet Place 1 tablet (0.4 mg total) under the tongue every 5 (five) minutes as needed for chest pain. 25 tablet 1  . oxyCODONE 10 MG TABS Take 0.5 tablets (5 mg total) by mouth every 4 (four) hours as needed for severe pain. (Patient not taking: Reported on 04/28/2020) 30 tablet 0  . predniSONE (DELTASONE) 5 MG tablet Take 1 tablet (5 mg total) by mouth daily with breakfast. 90 tablet 1   No current facility-administered medications for this visit.    REVIEW OF SYSTEMS:   Constitutional: ( - ) fevers, ( - )  chills , ( - ) night sweats Eyes: ( - ) blurriness of vision, ( - ) double vision, ( - ) watery eyes Ears, nose, mouth, throat, and face: ( - ) mucositis, ( - ) sore throat Respiratory: ( - ) cough, ( - ) dyspnea, ( - ) wheezes Cardiovascular: ( - ) palpitation, ( - ) chest discomfort, (  - ) lower extremity swelling Gastrointestinal:  ( - ) nausea, ( - ) heartburn, ( - ) change in bowel habits Skin: ( - ) abnormal skin rashes Lymphatics: ( - ) new lymphadenopathy, ( - ) easy bruising Neurological: ( - ) numbness, ( - ) tingling, ( - ) new weaknesses Behavioral/Psych: ( - ) mood change, ( - ) new changes  All other systems were reviewed with the patient and are negative.  PHYSICAL EXAMINATION: ECOG PERFORMANCE STATUS: 1 - Symptomatic but completely ambulatory  Vitals:   09/08/20 1055  BP: (!) 147/89  Pulse: 70  Resp: 16  Temp: 97.6 F (36.4 C)  SpO2: 99%   Filed Weights   09/08/20 1055  Weight: 183 lb 3.2 oz (83.1 kg)    GENERAL:  Well appearing middle aged [place] American male. alert, no distress and comfortable SKIN: skin color, texture, turgor are normal, no rashes or significant lesions EYES: conjunctiva are pink and non-injected, sclera clear LUNGS: clear to auscultation and percussion with normal breathing effort HEART: regular rate & rhythm and no murmurs and no lower extremity edema Musculoskeletal: no cyanosis of digits and no clubbing  PSYCH: alert & oriented x 3, fluent speech NEURO: no focal motor/sensory deficits  LABORATORY DATA:  I have reviewed the data as listed CBC Latest Ref Rng & Units 09/08/2020 08/25/2020 08/09/2020  WBC 4.0 - 10.5 K/uL 7.4 6.7 7.4  Hemoglobin 13.0 - 17.0 g/dL 11.1(L) 10.9(L) 11.7(L)  Hematocrit 39.0 - 52.0 % 34.9(L) 34.1(L) 36.4(L)  Platelets 150 - 400 K/uL 164 169 214    CMP Latest Ref Rng & Units 08/25/2020 08/09/2020 07/14/2020  Glucose 70 - 99 mg/dL 123(H) 101(H) 115(H)  BUN 8 - 23 mg/dL 26(H) 17 19  Creatinine 0.61 - 1.24 mg/dL 1.49(H) 1.65(H) 1.50(H)  Sodium 135 - 145 mmol/L 140 138 140  Potassium 3.5 - 5.1 mmol/L 3.9 4.3 3.7  Chloride 98 - 111 mmol/L 107 104 106  CO2 22 - 32 mmol/L 23 24 23   Calcium 8.9 - 10.3 mg/dL 9.6 9.9 9.7  Total Protein 6.5 - 8.1 g/dL 7.8 8.4(H) 8.4(H)  Total Bilirubin 0.3 -  1.2 mg/dL  0.2(L) 0.2(L) 0.2(L)  Alkaline Phos 38 - 126 U/L 70 68 71  AST 15 - 41 U/L 17 17 20   ALT 0 - 44 U/L 17 18 24    RADIOGRAPHIC STUDIES: CT CHEST ABDOMEN PELVIS W CONTRAST  Result Date: 08/10/2020 CLINICAL DATA:  Prostate cancer.  Evaluate response to therapy. EXAM: CT CHEST, ABDOMEN, AND PELVIS WITH CONTRAST TECHNIQUE: Multidetector CT imaging of the chest, abdomen and pelvis was performed following the standard protocol during bolus administration of intravenous contrast. CONTRAST:  41mL OMNIPAQUE IOHEXOL 300 MG/ML  SOLN COMPARISON:  04/22/2020 FINDINGS: CT CHEST FINDINGS Cardiovascular: The heart size appears within normal limits. Aortic atherosclerosis. Coronary artery calcifications. Mediastinum/Nodes: Normal appearance of the thyroid gland. The trachea appears patent and is midline. Normal appearance of the esophagus. Index right paratracheal lymph node measures 0.7 cm, image 28/2. Previously 0.7 cm. Index right hilar lymph node measures 0.8 cm, image 32/2. Previously 1.1 cm. Lungs/Pleura: No pleural effusion, airspace consolidation, or atelectasis. Perifissural nodule within the right middle lobe measures 3 mm, image 100/4. Previously 0.5 cm. Right lower lobe lung nodule measures 0.6 cm, image 99/4. Previously 0.9 cm. Musculoskeletal: Previously noted sclerotic lesions involving the sternal manubrium and thoracic spine are less conspicuous on today's study compatible with AA response to therapy. CT ABDOMEN PELVIS FINDINGS Hepatobiliary: Similar appearance of scattered low-density liver lesions which are favored to represent multiple cysts. No new suspicious liver abnormality. Gallbladder appears normal. No biliary dilatation. Pancreas: Unremarkable. No pancreatic ductal dilatation or surrounding inflammatory changes. Spleen: Normal in size without focal abnormality. Adrenals/Urinary Tract: Normal appearance of the adrenal glands. The right kidney appears within normal limits. No mass or hydronephrosis.  Mild progressive asymmetric atrophy of the left kidney with surrounding perinephric soft tissue stranding. Urinary bladder appears within normal limits. Stomach/Bowel: Stomach is normal. The appendix is visualized and appears within normal limits. No bowel wall thickening, inflammation, or distension. Vascular/Lymphatic: Aortic atherosclerosis.  No aneurysm identified. Index left periaortic lymph node measures 7 mm, image 79/2. Unchanged. Index left common iliac lymph node measures 0.9 cm, image 96/2. Previously 1.2 cm. Left pelvic sidewall lymph node measures 1.2 cm, image 112/2. Previously 1 cm. Left posterior pelvic sidewall lymph node measures 1.1 cm, image 113/2. Previously 1.1 cm. Reproductive: Stable prostate gland enlargement with mass effect upon the bladder base. Other: No ascites or focal fluid collections identified. Musculoskeletal: There has been mild improvement in multifocal sclerotic bone metastases. No signs of progressive disease within the lumbar spine or bony pelvis. IMPRESSION: 1. Interval improvement multifocal sclerotic bone metastases. 2. Decrease in size of mediastinal and hilar lymph nodes. Lung nodules are stable to improved in the interval. 3. Stable appearance of retroperitoneal and pelvic adenopathy. 4. Mild progressive asymmetric atrophy of the left kidney with surrounding perinephric soft tissue stranding. No hydronephrosis. 5. Aortic Atherosclerosis (ICD10-I70.0). and coronary artery calcifications. Electronically Signed   By: Kerby Moors M.D.   On: 08/10/2020 12:59    ASSESSMENT & PLAN Maggie Dworkin 65 y.o. male with medical history significant for metastatic prostate cancer who presents for a follow up visit.    Previously we discussed the treatment options moving forward including d/c of abiraterone and the addition of enzalutamide to his ADT therapy.  I discussed the side effects of both of these medications and the required follow-up.  After weighing the pros and  cons the patient wanted to proceed with the enzalutamide therapy and stop taking the abiraterone therapy. However, he now reports he has never  taken the enzalutamide as he is concerned about the side effects. He restarted the abiraterone and has been on this medication since Dec 2021.   The prostate biopsy has been obtained and confirms adenocarcinoma of the prostate. We can began treating with abiraterone 1000 mg p.o. daily with 5 mg of prednisone, however he d/c this due to worsening anxiety/depression. He will continue his q. 3 monthly 22.5 mg Lupron shots (most recent shot was a 45mg  on 03/03/2020, next due March 2022).  Additionally testosterone and PSA levels will be rechecked q 3 months. There is no clear indication for bisphosphonate therapy in the setting of castrate sensitive prostate cancer with metastatic disease to the bone.  After discussion today he was agreeable to continuing Zytiga 1000 mg daily with prednisone.  We will set him up for q 4 week follow ups.  # Metastatic Castrate Sensitive Prostate Cancer  --repeat CT C/A/P Scan and NM bone scan due May 2022. Will repeat q 3 months initially.  --prostate biopsy performed by urology on 03/30/2020, confirms diagnosis of prostate adenocarcinoma. Gleason score not calculated due to ADT therapy  --Received dose of lupron 22.5 mg today. Next shot due in June 2022.  --continue calcium/vitamin D for bone protection on ADT therapy.  --continue abiraterone 1000mg  PO daily with 5 mg prednisone.  --the literature does not currently support the use of bisphosphonates in the setting of CSPC.  --have patient RTC 4 weeks   #Symptom management -- oxycodone 5mg  q4H PRN for pain. He has not required these as his pain is currently under control --encourage OTC senna for constipation prevention if taking opioids.  #Goals of Care --discussed the incurable nature of metastatic disease --emphasized that all treatments moving forward are focused on symptom  control, decreasing tumor size/slow spread, and increasing lifespan.  --patient voiced understanding of the palliative nature of treatments moving forward.  --patients MPOA is Chanel Sarina Ill (his daughter)   No orders of the defined types were placed in this encounter.   All questions were answered. The patient knows to call the clinic with any problems, questions or concerns.  A total of more than 30 minutes were spent on this encounter and over half of that time was spent on counseling and coordination of care as outlined above.   Ledell Peoples, MD Department of Hematology/Oncology [place] [facility] Phone: [phone] Pager: [phone] Email: [email or web address]  09/08/2020 11:04 AM   Literature Support:  Juanda Crumble A Systematic Review and Meta-Analysis about the Effect of Bisphosphonates on the Risk of Skeletal-Related Event in Men with Prostate Cancer. Anticancer Agents Med Chem. 2020;20(13):1604-1612.  --Our study demonstrated that bisphosphonates could not statistically significantly reduce the risk of SRE in patients with prostate cancer, neither in the subgroups with M1 or CSPC.

## 2020-09-08 NOTE — Patient Instructions (Signed)

## 2020-09-09 ENCOUNTER — Telehealth: Payer: Self-pay | Admitting: Hematology and Oncology

## 2020-09-09 NOTE — Telephone Encounter (Signed)
Scheduled per 3/17 los. Called and spoke with pt, confirmed 4/14 appt

## 2020-09-13 MED FILL — predniSONE 5 MG TABS: 5 | 34 days supply | Qty: 34 | Fill #1

## 2020-09-13 MED FILL — ABIRATERONE ACETATE 250 MG: 250 | 30 days supply | Qty: 120 | Fill #1

## 2020-09-20 ENCOUNTER — Other Ambulatory Visit (HOSPITAL_COMMUNITY): Payer: Self-pay

## 2020-10-06 ENCOUNTER — Other Ambulatory Visit: Payer: Self-pay

## 2020-10-06 ENCOUNTER — Encounter: Payer: Self-pay | Admitting: Hematology and Oncology

## 2020-10-06 ENCOUNTER — Other Ambulatory Visit: Payer: Self-pay | Admitting: Hematology and Oncology

## 2020-10-06 ENCOUNTER — Inpatient Hospital Stay (HOSPITAL_BASED_OUTPATIENT_CLINIC_OR_DEPARTMENT_OTHER): Payer: Medicaid Other | Admitting: Hematology and Oncology

## 2020-10-06 ENCOUNTER — Inpatient Hospital Stay: Payer: Medicaid Other | Attending: Hematology and Oncology

## 2020-10-06 VITALS — BP 149/78 | HR 83 | Temp 98.1°F | Resp 18 | Ht 73.0 in | Wt 183.9 lb

## 2020-10-06 DIAGNOSIS — C7951 Secondary malignant neoplasm of bone: Secondary | ICD-10-CM | POA: Diagnosis not present

## 2020-10-06 DIAGNOSIS — F17210 Nicotine dependence, cigarettes, uncomplicated: Secondary | ICD-10-CM | POA: Insufficient documentation

## 2020-10-06 DIAGNOSIS — Z79899 Other long term (current) drug therapy: Secondary | ICD-10-CM | POA: Diagnosis not present

## 2020-10-06 DIAGNOSIS — I252 Old myocardial infarction: Secondary | ICD-10-CM | POA: Diagnosis not present

## 2020-10-06 DIAGNOSIS — C61 Malignant neoplasm of prostate: Secondary | ICD-10-CM

## 2020-10-06 LAB — CBC WITH DIFFERENTIAL (CANCER CENTER ONLY)
Abs Immature Granulocytes: 0.02 10*3/uL (ref 0.00–0.07)
Basophils Absolute: 0.1 10*3/uL (ref 0.0–0.1)
Basophils Relative: 1 %
Eosinophils Absolute: 0.1 10*3/uL (ref 0.0–0.5)
Eosinophils Relative: 1 %
HCT: 36.1 % — ABNORMAL LOW (ref 39.0–52.0)
Hemoglobin: 11.7 g/dL — ABNORMAL LOW (ref 13.0–17.0)
Immature Granulocytes: 0 %
Lymphocytes Relative: 30 %
Lymphs Abs: 2.2 10*3/uL (ref 0.7–4.0)
MCH: 28.1 pg (ref 26.0–34.0)
MCHC: 32.4 g/dL (ref 30.0–36.0)
MCV: 86.6 fL (ref 80.0–100.0)
Monocytes Absolute: 0.6 10*3/uL (ref 0.1–1.0)
Monocytes Relative: 9 %
Neutro Abs: 4.2 10*3/uL (ref 1.7–7.7)
Neutrophils Relative %: 59 %
Platelet Count: 207 10*3/uL (ref 150–400)
RBC: 4.17 MIL/uL — ABNORMAL LOW (ref 4.22–5.81)
RDW: 13.9 % (ref 11.5–15.5)
WBC Count: 7.1 10*3/uL (ref 4.0–10.5)
nRBC: 0 % (ref 0.0–0.2)

## 2020-10-06 LAB — CMP (CANCER CENTER ONLY)
ALT: 19 U/L (ref 0–44)
AST: 19 U/L (ref 15–41)
Albumin: 4.2 g/dL (ref 3.5–5.0)
Alkaline Phosphatase: 72 U/L (ref 38–126)
Anion gap: 12 (ref 5–15)
BUN: 18 mg/dL (ref 8–23)
CO2: 24 mmol/L (ref 22–32)
Calcium: 10.2 mg/dL (ref 8.9–10.3)
Chloride: 104 mmol/L (ref 98–111)
Creatinine: 1.52 mg/dL — ABNORMAL HIGH (ref 0.61–1.24)
GFR, Estimated: 51 mL/min — ABNORMAL LOW (ref >60)
Glucose, Bld: 99 mg/dL (ref 70–99)
Potassium: 4.1 mmol/L (ref 3.5–5.1)
Sodium: 140 mmol/L (ref 135–145)
Total Bilirubin: 0.2 mg/dL — ABNORMAL LOW (ref 0.3–1.2)
Total Protein: 8.1 g/dL (ref 6.5–8.1)

## 2020-10-06 NOTE — Progress Notes (Signed)
[name] [name]   [phone]  PROGRESS NOTE  Patient Care Team: [name] [name]. 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 Seen by pulmonary with plans for PET and biopsy, though he was lost to follow up.  3) 01/17/2020: sent to the ED by Orthopedic surgery due to metastasis of the spine noted on MRI. IN the ED CT C/A/P showed extensive left pelvic and retroperitoneal lymphadenopathy. Mediastinal and bilateral hilar lymphadenopathy as well as mixed lytic and sclerotic lesions are noted in the bony pelvis 4) 01/17/2020: PSA noted to be 1074.94 5) 01/22/2020: establish care with [name]. Started Bicalutamide 50mg  PO daily. Plan for 28 days of therapy.   6) 02/05/2020: Lupron 22.5mg  started.  7) 03/03/2020: received 45mg  lupron with urology. PSA 9.6 8) 03/30/2020: prostate biopsy performed, confirmed adenocarcinoma of the prostate.  9) 04/25/2020: stopped Zytiga therapy after < 7 days due to worsening depression 10) 04/28/2020: received enzalutamide 160mg , did not take any doses 11) 06/15/2020: agreed to restart Zytiga 1000mg  daily with prednisone 5mg   12) 09/08/2020: Lupron 22.5mg  shot.   Interval History:  [name] [name] 65 y.o. male with medical history significant for metastatic prostate cancer who presents for a follow up visit. The patient's last visit was on 09/08/2020. In the interim since the last visit [name] [name] has continued abiraterone therapy without difficulty.   On exam today [name] [name] reports that he is doing well overall.   He reports that not much is changed in the last month.  He reports that his energy is "so-so".  His weight has been steady at 183 pounds.  Fortunately he has not been having any issues with hot flashes, sweats, or pain.  He has been faithfully taking his abiraterone with no missed doses.  He reports that he is not been having any difficulty filling the medication and only takes it with his prednisone.  He denies any fevers, chills, sweats, nausea, vomiting or diarrhea.  Full 10 point ROS is listed below.  MEDICAL HISTORY:  Past Medical History:  Diagnosis Date  . Brain tumor [facility])    diagnosed by MRI 6 months ago  . Cancer ([place])    Pt reports brain tumor 2 years ago  . Coronary artery disease 2011   s/p BMS stent LAD, [facility] [place], [place], Dr Chauncey Cruel. [name]  . Depression   . Dyspnea   . High cholesterol   . History of herniated intervertebral disc   . HTN (hypertension)   . Hyperlipidemia LDL goal <70 07/21/2013  . Lung nodule   . NSTEMI (non-ST elevated myocardial infarction) ([place]) 11/11/2017  . Smoker    1/2 ppd x >40 years     SURGICAL HISTORY: Past Surgical History:  Procedure Laterality Date  . CORONARY ANGIOPLASTY WITH STENT PLACEMENT Left 2011   BMS LAD, [facility] [place], [place], Dr Chauncey Cruel. [name]  . CORONARY STENT INTERVENTION N/A 11/12/2017   Procedure: CORONARY STENT INTERVENTION;  Surgeon: Leonie Man, MD;  Location: [facility];  Service: Cardiovascular;  Laterality: N/A;  . LEFT HEART CATH AND CORONARY ANGIOGRAPHY N/A 11/12/2017   Procedure:  LEFT HEART CATH AND CORONARY ANGIOGRAPHY;  Surgeon: Leonie Man, MD;  Location: [facility];  Service: Cardiovascular;  Laterality: N/A;    SOCIAL HISTORY: Social History   Socioeconomic History  . Marital status: Single    Spouse name: Not on file  . Number of children: Not on file  . Years of education: Not on file  . Highest education level: Not on file  Occupational History   . Occupation: unemployed  Tobacco Use  . Smoking status: Current Every Day Smoker    Packs/day: 0.25    Years: 45.00    Pack years: 11.25  . Smokeless tobacco: Never Used  Vaping Use  . Vaping Use: Never used  Substance and Sexual Activity  . Alcohol use: Yes    Alcohol/week: 75.0 standard drinks    Types: 75 Shots of liquor per week    Comment: 2 shots of liquor daily  . Drug use: Not Currently    Types: Marijuana    Comment: occasionally  . Sexual activity: Yes  Other Topics Concern  . Not on file  Social History Narrative   From [place]    Family lives in [place] (brother, sister and mother)    Unemployed    Drinks 3 (1/5)ths in 1 week   Social Determinants of Health   Financial Resource Strain: Low Risk   . Difficulty of Paying Living Expenses: Not very hard  Food Insecurity: No Food Insecurity  . Worried About Charity fundraiser in the Last Year: Never true  . Ran Out of Food in the Last Year: Never true  Transportation Needs: No Transportation Needs  . Lack of Transportation (Medical): No  . Lack of Transportation (Non-Medical): No  Physical Activity: Inactive  . Days of Exercise per Week: 0 days  . Minutes of Exercise per Session: 0 min  Stress: Stress Concern Present  . Feeling of Stress : To some extent  Social Connections: Socially Isolated  . Frequency of Communication with Friends and Family: Three times a week  . Frequency of Social Gatherings with Friends and Family: Three times a week  . Attends Religious Services: Never  . Active Member of Clubs or Organizations: No  . Attends Archivist Meetings: Never  . Marital Status: Never married  Human resources officer Violence: Not on file    FAMILY HISTORY: Family History  Problem Relation Age of Onset  . Diabetes Paternal Grandfather   . Hypertension Paternal Grandfather     ALLERGIES:  has No Known Allergies.  MEDICATIONS:  Current Outpatient Medications  Medication Sig Dispense  Refill  . abiraterone acetate (ZYTIGA) 250 MG tablet TAKE 4 TABLETS (1,000 MG TOTAL) BY MOUTH DAILY. TAKE ON AN EMPTY STOMACH 1 HOUR BEFORE OR 2 HOURS AFTER A MEAL 120 tablet 2  . aspirin 81 MG EC tablet Take 1 tablet (81 mg total) by mouth daily. 90 tablet 3  . atorvastatin (LIPITOR) 40 MG tablet TAKE 1 TABLET BY MOUTH ONCE DAILY 90 tablet 3  . calcium-vitamin D (OSCAL WITH D) 500-200 MG-UNIT tablet Take 1 tablet by mouth daily with breakfast. 90 tablet 3  . carvedilol (COREG) 6.25 MG tablet carvedilol 6.25 mg tablet  TAKE 1 TABLET BY MOUTH TWICE DAILY WITH A MEAL    . citalopram (CELEXA) 20 MG tablet TAKE 1 TABLET BY MOUTH ONCE DAILY (Patient not taking: Reported on 06/15/2020) 30 tablet 3  . clopidogrel (PLAVIX) 75 MG tablet Take 1 tablet (75 mg total) by  mouth daily. 90 tablet 3  . gabapentin (NEURONTIN) 300 MG capsule Take 300 mg by mouth 3 (three) times daily as needed for pain. (Patient not taking: Reported on 06/15/2020)    . lisinopril (ZESTRIL) 10 MG tablet Take 1 tablet (10 mg total) by mouth daily. 90 tablet 3  . nitroGLYCERIN (NITROSTAT) 0.4 MG SL tablet Place 1 tablet (0.4 mg total) under the tongue every 5 (five) minutes as needed for chest pain. 25 tablet 1  . oxyCODONE 10 MG TABS Take 0.5 tablets (5 mg total) by mouth every 4 (four) hours as needed for severe pain. (Patient not taking: Reported on 04/28/2020) 30 tablet 0  . predniSONE (DELTASONE) 5 MG tablet TAKE 1 TABLET BY MOUTH DAILY WITH BREAKFAST. 90 tablet 1   No current facility-administered medications for this visit.    REVIEW OF SYSTEMS:   Constitutional: ( - ) fevers, ( - )  chills , ( - ) night sweats Eyes: ( - ) blurriness of vision, ( - ) double vision, ( - ) watery eyes Ears, nose, mouth, throat, and face: ( - ) mucositis, ( - ) sore throat Respiratory: ( - ) cough, ( - ) dyspnea, ( - ) wheezes Cardiovascular: ( - ) palpitation, ( - ) chest discomfort, ( - ) lower extremity swelling Gastrointestinal:  ( - )  nausea, ( - ) heartburn, ( - ) change in bowel habits Skin: ( - ) abnormal skin rashes Lymphatics: ( - ) new lymphadenopathy, ( - ) easy bruising Neurological: ( - ) numbness, ( - ) tingling, ( - ) new weaknesses Behavioral/Psych: ( - ) mood change, ( - ) new changes  All other systems were reviewed with the patient and are negative.  PHYSICAL EXAMINATION: ECOG PERFORMANCE STATUS: 1 - Symptomatic but completely ambulatory  Vitals:   10/06/20 1457  BP: (!) 149/78  Pulse: 83  Resp: 18  Temp: 98.1 F (36.7 C)  SpO2: 98%   Filed Weights   10/06/20 1457  Weight: 183 lb 14.4 oz (83.4 kg)    GENERAL:  Well appearing middle aged [place] American male. alert, no distress and comfortable SKIN: skin color, texture, turgor are normal, no rashes or significant lesions EYES: conjunctiva are pink and non-injected, sclera clear LUNGS: clear to auscultation and percussion with normal breathing effort HEART: regular rate & rhythm and no murmurs and no lower extremity edema Musculoskeletal: no cyanosis of digits and no clubbing  PSYCH: alert & oriented x 3, fluent speech NEURO: no focal motor/sensory deficits  LABORATORY DATA:  I have reviewed the data as listed CBC Latest Ref Rng & Units 10/06/2020 09/08/2020 08/25/2020  WBC 4.0 - 10.5 K/uL 7.1 7.4 6.7  Hemoglobin 13.0 - 17.0 g/dL 11.7(L) 11.1(L) 10.9(L)  Hematocrit 39.0 - 52.0 % 36.1(L) 34.9(L) 34.1(L)  Platelets 150 - 400 K/uL 207 164 169    CMP Latest Ref Rng & Units 10/06/2020 09/08/2020 08/25/2020  Glucose 70 - 99 mg/dL 99 123(H) 123(H)  BUN 8 - 23 mg/dL 18 21 26(H)  Creatinine 0.61 - 1.24 mg/dL 1.52(H) 1.51(H) 1.49(H)  Sodium 135 - 145 mmol/L 140 139 140  Potassium 3.5 - 5.1 mmol/L 4.1 4.1 3.9  Chloride 98 - 111 mmol/L 104 106 107  CO2 22 - 32 mmol/L 24 27 23   Calcium 8.9 - 10.3 mg/dL 10.2 9.7 9.6  Total Protein 6.5 - 8.1 g/dL 8.1 8.0 7.8  Total Bilirubin 0.3 - 1.2 mg/dL 0.2(L) <0.2(L) 0.2(L)  Alkaline Phos 38 - 126 U/L  72 70 70   AST 15 - 41 U/L 19 14(L) 17  ALT 0 - 44 U/L 19 16 17    RADIOGRAPHIC STUDIES: No results found.  ASSESSMENT & PLAN [name] [name] 65 y.o. male with medical history significant for metastatic prostate cancer who presents for a follow up visit.    Previously we discussed the treatment options moving forward including d/c of abiraterone and the addition of enzalutamide to his ADT therapy.  I discussed the side effects of both of these medications and the required follow-up.  After weighing the pros and cons the patient wanted to proceed with the enzalutamide therapy and stop taking the abiraterone therapy. However, he now reports he has never taken the enzalutamide as he is concerned about the side effects. He restarted the abiraterone and has been on this medication since Dec 2021.   The prostate biopsy has been obtained and confirms adenocarcinoma of the prostate. We can began treating with abiraterone 1000 mg p.o. daily with 5 mg of prednisone, however he d/c this due to worsening anxiety/depression. He will continue his q. 3 monthly 22.5 mg Lupron shots (most recent shot was a 45mg  on 03/03/2020, next due March 2022).  Additionally testosterone and PSA levels will be rechecked q 3 months. There is no clear indication for bisphosphonate therapy in the setting of castrate sensitive prostate cancer with metastatic disease to the bone.  After discussion today he was agreeable to continuing Zytiga 1000 mg daily with prednisone.  We will set him up for q 4 week follow ups.  # Metastatic Castrate Sensitive Prostate Cancer  --repeat CT C/A/P Scan and NM bone scan due May 2022. Will repeat q 3 months initially.  --prostate biopsy performed by urology on 03/30/2020, confirms diagnosis of prostate adenocarcinoma. Gleason score not calculated due to ADT therapy  --Received dose of lupron 22.5 mg March 2022. Next shot due in June 2022.  --continue calcium/vitamin D for bone protection on ADT therapy.   --continue abiraterone 1000mg  PO daily with 5 mg prednisone.  --the literature does not currently support the use of bisphosphonates in the setting of CSPC.  --have patient RTC 4 weeks   #Symptom management -- oxycodone 5mg  q4H PRN for pain. He has not required these as his pain is currently under control --encourage OTC senna for constipation prevention if taking opioids.  #Goals of Care --discussed the incurable nature of metastatic disease --emphasized that all treatments moving forward are focused on symptom control, decreasing tumor size/slow spread, and increasing lifespan.  --patient voiced understanding of the palliative nature of treatments moving forward.  --patients MPOA is [name] [name] (his daughter)   Orders Placed This Encounter  Procedures  . CT CHEST ABDOMEN PELVIS W CONTRAST    Standing Status:   Future    Standing Expiration Date:   10/06/2021    Order Specific Question:   If indicated for the ordered procedure, I authorize the administration of contrast media per Radiology protocol    Answer:   Yes    Order Specific Question:   Preferred imaging location?    Answer:   [facility]    Order Specific Question:   Release to patient    Answer:   Immediate    Order Specific Question:   Is Oral Contrast requested for this exam?    Answer:   Yes, Per Radiology protocol    Order Specific Question:   Reason for Exam (SYMPTOM  OR DIAGNOSIS REQUIRED)    Answer:  metastatic prostate cancer, access for progression  . NM Bone Scan Whole Body    Standing Status:   Future    Standing Expiration Date:   10/06/2021    Order Specific Question:   If indicated for the ordered procedure, I authorize the administration of a radiopharmaceutical per Radiology protocol    Answer:   Yes    Order Specific Question:   Preferred imaging location?    Answer:   [facility]    All questions were answered. The patient knows to call the clinic with any problems, questions  or concerns.  A total of more than 30 minutes were spent on this encounter and over half of that time was spent on counseling and coordination of care as outlined above.   [name] Peoples, MD Department of Hematology/Oncology [place] [facility] Phone: [phone] Pager: [phone] Email: [email or web address]  10/06/2020 3:35 PM   Literature Support:  Juanda Crumble A Systematic Review and Meta-Analysis about the Effect of Bisphosphonates on the Risk of Skeletal-Related Event in Men with Prostate Cancer. Anticancer Agents Med Chem. 2020;20(13):1604-1612.  --Our study demonstrated that bisphosphonates could not statistically significantly reduce the risk of SRE in patients with prostate cancer, neither in the subgroups with M1 or CSPC.

## 2020-10-07 LAB — TESTOSTERONE: Testosterone: <3 ng/dL — ABNORMAL LOW (ref 264–916)

## 2020-10-07 LAB — PROSTATE-SPECIFIC AG, SERUM (LABCORP): Prostate Specific Ag, Serum: <0.1 ng/mL (ref 0.0–4.0)

## 2020-10-11 ENCOUNTER — Other Ambulatory Visit (HOSPITAL_COMMUNITY): Payer: Self-pay

## 2020-10-11 MED FILL — Abiraterone Acetate Tab 250 MG: ORAL | 30 days supply | Qty: 120 | Fill #0 | Status: AC

## 2020-10-20 ENCOUNTER — Other Ambulatory Visit (HOSPITAL_COMMUNITY): Payer: Self-pay

## 2020-10-31 ENCOUNTER — Ambulatory Visit (HOSPITAL_COMMUNITY): Payer: Medicaid Other

## 2020-11-01 ENCOUNTER — Ambulatory Visit (HOSPITAL_COMMUNITY): Payer: Medicaid Other

## 2020-11-02 NOTE — Progress Notes (Signed)
[name] [name] [phone]   [phone]  PROGRESS NOTE  Patient Care Team: Rosilyn Mings. 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 Seen by pulmonary with plans for PET and biopsy, though he was lost to follow up.  3) 01/17/2020: sent to the ED by Orthopedic surgery due to metastasis of the spine noted on MRI. IN the ED CT C/A/P showed extensive left pelvic and retroperitoneal lymphadenopathy. Mediastinal and bilateral hilar lymphadenopathy as well as mixed lytic and sclerotic lesions are noted in the bony pelvis 4) 01/17/2020: PSA noted to be 1074.94 5) 01/22/2020: establish care with [name]. Started Bicalutamide 50mg  PO daily. Plan for 28 days of therapy.   6) 02/05/2020: Lupron 22.5mg  started.  7) 03/03/2020: received 45mg  lupron with urology. PSA 9.6 8) 03/30/2020: prostate biopsy performed, confirmed adenocarcinoma of the prostate.  9) 04/25/2020: stopped Zytiga therapy after < 7 days due to worsening depression 10) 04/28/2020: received enzalutamide 160mg , did not take any doses 11) 06/15/2020: agreed to restart Zytiga 1000mg  daily with prednisone 5mg   12) 09/08/2020: Lupron 22.5mg  shot.   Interval History:  [name] [name] 65 y.o. male with medical history significant for metastatic prostate cancer who presents for a follow up visit. The patient's last visit was on 10/06/2020. In the interim since the last visit [name] [name] has continued abiraterone therapy without difficulty.   On exam today [name] [name] reports his energy has been "so-so"  since our last visit.  He has not been taking antidepressant as he notes he feels well enough that he does not think he needs to take it.  He has been compliant with his Zytiga and prednisone and rarely misses a dose.  He does have some occasional numbness and tingling in his fingers and toes but this is transient is not causing any interference in his day-to-day life.  His appetite has been good has been having no issues with nausea, vomiting or diarrhea.  Reports that he has not been having any bone pain or back pain.  He is otherwise at his baseline mental health.  He denies any fevers, chills, sweats, nausea, vomiting or diarrhea.  Full 10 point ROS is listed below.  MEDICAL HISTORY:  Past Medical History:  Diagnosis Date  . Brain tumor [facility])    diagnosed by MRI 6 months ago  . Cancer ([place])    Pt reports brain tumor 2 years ago  . Coronary artery disease 2011   s/p BMS stent LAD, [facility], [facility] [place], [place], Dr Chauncey Cruel. [name]  . Depression   . Dyspnea   . High cholesterol   . History of herniated intervertebral disc   . HTN (hypertension)   . Hyperlipidemia LDL goal <70 07/21/2013  . Lung nodule   . NSTEMI (non-ST elevated myocardial infarction) ([place]) 11/11/2017  . Smoker    1/2 ppd x >40 years     SURGICAL HISTORY: Past Surgical History:  Procedure Laterality Date  . CORONARY ANGIOPLASTY WITH STENT PLACEMENT Left 2011   BMS LAD, [facility], [facility] [place], [place], Dr Chauncey Cruel. [name]  . CORONARY STENT INTERVENTION N/A 11/12/2017   Procedure: CORONARY STENT INTERVENTION;  Surgeon: Leonie Man, MD;  Location: [place] [facility];  Service: Cardiovascular;  Laterality: N/A;  . LEFT HEART CATH AND CORONARY ANGIOGRAPHY N/A 11/12/2017   Procedure: LEFT HEART CATH AND CORONARY ANGIOGRAPHY;  Surgeon: Leonie Man, MD;  Location: [place] [facility];  Service: Cardiovascular;  Laterality: N/A;    SOCIAL HISTORY: Social History   Socioeconomic History  . Marital status:  Single    Spouse name: Not on file  . Number of children: Not on file  . Years of education: Not on file  . Highest education level: Not on file  Occupational History  . Occupation: unemployed  Tobacco Use  . Smoking status: Current Every Day Smoker    Packs/day: 0.25    Years: 45.00    Pack years: 11.25  . Smokeless tobacco: Never Used  Vaping Use  . Vaping Use: Never used  Substance and Sexual Activity  . Alcohol use: Yes    Alcohol/week: 75.0 standard drinks    Types: 75 Shots of liquor per week    Comment: 2 shots of liquor daily  . Drug use: Not Currently    Types: Marijuana    Comment: occasionally  . Sexual activity: Yes  Other Topics Concern  . Not on file  Social History Narrative   From [place]    Family lives in [place] (brother, sister and mother)    Unemployed    Drinks 3 (1/5)ths in 1 week   Social Determinants of Health   Financial Resource Strain: Low Risk   . Difficulty of Paying Living Expenses: Not very hard  Food Insecurity: No Food Insecurity  . Worried About Charity fundraiser in the Last Year: Never true  . Ran Out of Food in the Last Year: Never true  Transportation Needs: No Transportation Needs  . Lack of Transportation (Medical): No  . Lack of Transportation (Non-Medical): No  Physical Activity: Inactive  . Days of Exercise per Week: 0 days  . Minutes of Exercise per Session: 0 min  Stress: Stress Concern Present  . Feeling of Stress : To some extent  Social Connections: Socially Isolated  . Frequency of Communication with Friends and Family: Three times a week  . Frequency of Social Gatherings with Friends and Family: Three times a week  . Attends Religious Services: Never  . Active Member of Clubs or Organizations: No  . Attends Archivist Meetings: Never  . Marital Status: Never married  Human resources officer Violence: Not on file    FAMILY HISTORY: Family History  Problem Relation Age of Onset  . Diabetes Paternal  Grandfather   . Hypertension Paternal Grandfather     ALLERGIES:  has No Known Allergies.  MEDICATIONS:  Current Outpatient Medications  Medication Sig Dispense Refill  . gabapentin (NEURONTIN) 300 MG capsule Take 300 mg by mouth 3 (three) times daily as needed for pain.    Marland Kitchen abiraterone acetate (ZYTIGA) 250 MG tablet TAKE 4 TABLETS (1,000 MG TOTAL) BY MOUTH DAILY. TAKE ON AN EMPTY STOMACH 1 HOUR BEFORE OR 2 HOURS AFTER A MEAL 120 tablet 2  . aspirin 81 MG EC tablet Take 1 tablet (81 mg total) by mouth daily. 90 tablet 3  . atorvastatin (LIPITOR) 40 MG tablet TAKE 1 TABLET BY MOUTH ONCE DAILY 90 tablet 3  . calcium-vitamin D (OSCAL WITH D) 500-200 MG-UNIT tablet Take 1 tablet by mouth daily with breakfast. 90 tablet 3  . carvedilol (COREG) 6.25 MG tablet carvedilol 6.25  mg tablet  TAKE 1 TABLET BY MOUTH TWICE DAILY WITH A MEAL    . citalopram (CELEXA) 20 MG tablet TAKE 1 TABLET BY MOUTH ONCE DAILY (Patient not taking: Reported on 06/15/2020) 30 tablet 3  . clopidogrel (PLAVIX) 75 MG tablet Take 1 tablet (75 mg total) by mouth daily. 90 tablet 3  . lisinopril (ZESTRIL) 10 MG tablet Take 1 tablet (10 mg total) by mouth daily. 90 tablet 3  . nitroGLYCERIN (NITROSTAT) 0.4 MG SL tablet Place 1 tablet (0.4 mg total) under the tongue every 5 (five) minutes as needed for chest pain. 25 tablet 1  . oxyCODONE 10 MG TABS Take 0.5 tablets (5 mg total) by mouth every 4 (four) hours as needed for severe pain. (Patient not taking: Reported on 04/28/2020) 30 tablet 0  . predniSONE (DELTASONE) 5 MG tablet TAKE 1 TABLET BY MOUTH DAILY WITH BREAKFAST. 90 tablet 1   No current facility-administered medications for this visit.    REVIEW OF SYSTEMS:   Constitutional: ( - ) fevers, ( - )  chills , ( - ) night sweats Eyes: ( - ) blurriness of vision, ( - ) double vision, ( - ) watery eyes Ears, nose, mouth, throat, and face: ( - ) mucositis, ( - ) sore throat Respiratory: ( - ) cough, ( - ) dyspnea, ( - )  wheezes Cardiovascular: ( - ) palpitation, ( - ) chest discomfort, ( - ) lower extremity swelling Gastrointestinal:  ( - ) nausea, ( - ) heartburn, ( - ) change in bowel habits Skin: ( - ) abnormal skin rashes Lymphatics: ( - ) new lymphadenopathy, ( - ) easy bruising Neurological: ( - ) numbness, ( - ) tingling, ( - ) new weaknesses Behavioral/Psych: ( - ) mood change, ( - ) new changes  All other systems were reviewed with the patient and are negative.  PHYSICAL EXAMINATION: ECOG PERFORMANCE STATUS: 1 - Symptomatic but completely ambulatory  Vitals:   11/03/20 1003  BP: 137/72  Pulse: 85  Resp: 19  Temp: (!) 96.4 F (35.8 C)  SpO2: 100%   Filed Weights   11/03/20 1003  Weight: 182 lb 1.6 oz (82.6 kg)    GENERAL:  Well appearing middle aged [place] American male. alert, no distress and comfortable SKIN: skin color, texture, turgor are normal, no rashes or significant lesions EYES: conjunctiva are pink and non-injected, sclera clear LUNGS: clear to auscultation and percussion with normal breathing effort HEART: regular rate & rhythm and no murmurs and no lower extremity edema Musculoskeletal: no cyanosis of digits and no clubbing  PSYCH: alert & oriented x 3, fluent speech NEURO: no focal motor/sensory deficits  LABORATORY DATA:  I have reviewed the data as listed CBC Latest Ref Rng & Units 11/03/2020 10/06/2020 09/08/2020  WBC 4.0 - 10.5 K/uL 6.9 7.1 7.4  Hemoglobin 13.0 - 17.0 g/dL 11.3(L) 11.7(L) 11.1(L)  Hematocrit 39.0 - 52.0 % 35.7(L) 36.1(L) 34.9(L)  Platelets 150 - 400 K/uL 187 207 164    CMP Latest Ref Rng & Units 11/03/2020 10/06/2020 09/08/2020  Glucose 70 - 99 mg/dL 114(H) 99 123(H)  BUN 8 - 23 mg/dL 22 18 21   Creatinine 0.61 - 1.24 mg/dL 1.42(H) 1.52(H) 1.51(H)  Sodium 135 - 145 mmol/L 141 140 139  Potassium 3.5 - 5.1 mmol/L 3.9 4.1 4.1  Chloride 98 - 111 mmol/L 106 104 106  CO2 22 - 32 mmol/L 25 24 27   Calcium 8.9 - 10.3 mg/dL 10.0 10.2 9.7  Total Protein  6.5 - 8.1 g/dL 8.0 8.1 8.0  Total Bilirubin 0.3 - 1.2 mg/dL 0.4 0.2(L) <0.2(L)  Alkaline Phos 38 - 126 U/L 73 72 70  AST 15 - 41 U/L 15 19 14(L)  ALT 0 - 44 U/L 13 19 16    RADIOGRAPHIC STUDIES: No results found.  ASSESSMENT & PLAN [name] [name] 65 y.o. male with medical history significant for metastatic prostate cancer who presents for a follow up visit.    Previously we discussed the treatment options moving forward including d/c of abiraterone and the addition of enzalutamide to his ADT therapy.  I discussed the side effects of both of these medications and the required follow-up.  After weighing the pros and cons the patient wanted to proceed with the enzalutamide therapy and stop taking the abiraterone therapy. However, he now reports he has never taken the enzalutamide as he is concerned about the side effects. He restarted the abiraterone and has been on this medication since Dec 2021.   The prostate biopsy has been obtained and confirms adenocarcinoma of the prostate. We can began treating with abiraterone 1000 mg p.o. daily with 5 mg of prednisone, however he d/c this due to worsening anxiety/depression. He will continue his q. 3 monthly 22.5 mg Lupron shots (most recent shot was a 45mg  on 03/03/2020, next due March 2022).  Additionally testosterone and PSA levels will be rechecked q 3 months. There is no clear indication for bisphosphonate therapy in the setting of castrate sensitive prostate cancer with metastatic disease to the bone.  After discussion today he was agreeable to continuing Zytiga 1000 mg daily with prednisone.  We will set him up for q 3 months follow ups.  # Metastatic Castrate Sensitive Prostate Cancer  --repeat CT C/A/P Scan and NM bone scan due May 2022. Will repeat q 3 months initially.  --prostate biopsy performed by urology on 03/30/2020, confirms diagnosis of prostate adenocarcinoma. Gleason score not calculated due to ADT therapy  --Received dose of lupron 22.5  mg March 2022. Next shot due in June 2022.  --continue calcium/vitamin D for bone protection on ADT therapy.  --continue abiraterone 1000mg  PO daily with 5 mg prednisone.  --the literature does not currently support the use of bisphosphonates in the setting of CSPC.  --have patient RTC 1 month for lupron shot and clinic visit. Can extend visits to q 3 months at that time if imaging and labs are stable.   #Symptom management -- oxycodone 5mg  q4H PRN for pain. He has not required these as his pain is currently under control --encourage OTC senna for constipation prevention if taking opioids.  #Goals of Care --discussed the incurable nature of metastatic disease --emphasized that all treatments moving forward are focused on symptom control, decreasing tumor size/slow spread, and increasing lifespan.  --patient voiced understanding of the palliative nature of treatments moving forward.  --patients MPOA is Chanel Sarina Ill (his daughter)   No orders of the defined types were placed in this encounter.   All questions were answered. The patient knows to call the clinic with any problems, questions or concerns.  A total of more than 30 minutes were spent on this encounter and over half of that time was spent on counseling and coordination of care as outlined above.   Ledell Peoples, MD Department of Hematology/Oncology [facility] Phone: [phone] Pager: [phone] Email: [email or web address]  11/03/2020 10:31 AM   Literature Support:  Juanda Crumble A Systematic Review and  Meta-Analysis about the Effect of Bisphosphonates on the Risk of Skeletal-Related Event in Men with Prostate Cancer. Anticancer Agents Med Chem. 2020;20(13):1604-1612.  --Our study demonstrated that bisphosphonates could not statistically significantly reduce the risk of SRE in patients with prostate cancer, neither in the subgroups with M1 or CSPC.

## 2020-11-03 ENCOUNTER — Inpatient Hospital Stay: Payer: Medicaid Other | Attending: Hematology and Oncology

## 2020-11-03 ENCOUNTER — Other Ambulatory Visit: Payer: Self-pay

## 2020-11-03 ENCOUNTER — Inpatient Hospital Stay (HOSPITAL_BASED_OUTPATIENT_CLINIC_OR_DEPARTMENT_OTHER): Payer: Medicaid Other | Admitting: Hematology and Oncology

## 2020-11-03 ENCOUNTER — Encounter: Payer: Self-pay | Admitting: Hematology and Oncology

## 2020-11-03 VITALS — BP 137/72 | HR 85 | Temp 96.4°F | Resp 19 | Wt 182.1 lb

## 2020-11-03 DIAGNOSIS — Z7952 Long term (current) use of systemic steroids: Secondary | ICD-10-CM | POA: Insufficient documentation

## 2020-11-03 DIAGNOSIS — C7951 Secondary malignant neoplasm of bone: Secondary | ICD-10-CM

## 2020-11-03 DIAGNOSIS — C61 Malignant neoplasm of prostate: Secondary | ICD-10-CM | POA: Insufficient documentation

## 2020-11-03 DIAGNOSIS — R590 Localized enlarged lymph nodes: Secondary | ICD-10-CM

## 2020-11-03 DIAGNOSIS — F17210 Nicotine dependence, cigarettes, uncomplicated: Secondary | ICD-10-CM | POA: Insufficient documentation

## 2020-11-03 DIAGNOSIS — Z79899 Other long term (current) drug therapy: Secondary | ICD-10-CM | POA: Insufficient documentation

## 2020-11-03 LAB — CMP (CANCER CENTER ONLY)
ALT: 13 U/L (ref 0–44)
AST: 15 U/L (ref 15–41)
Albumin: 3.9 g/dL (ref 3.5–5.0)
Alkaline Phosphatase: 73 U/L (ref 38–126)
Anion gap: 10 (ref 5–15)
BUN: 22 mg/dL (ref 8–23)
CO2: 25 mmol/L (ref 22–32)
Calcium: 10 mg/dL (ref 8.9–10.3)
Chloride: 106 mmol/L (ref 98–111)
Creatinine: 1.42 mg/dL — ABNORMAL HIGH (ref 0.61–1.24)
GFR, Estimated: 56 mL/min — ABNORMAL LOW (ref >60)
Glucose, Bld: 114 mg/dL — ABNORMAL HIGH (ref 70–99)
Potassium: 3.9 mmol/L (ref 3.5–5.1)
Sodium: 141 mmol/L (ref 135–145)
Total Bilirubin: 0.4 mg/dL (ref 0.3–1.2)
Total Protein: 8 g/dL (ref 6.5–8.1)

## 2020-11-03 LAB — CBC WITH DIFFERENTIAL (CANCER CENTER ONLY)
Abs Immature Granulocytes: 0.01 10*3/uL (ref 0.00–0.07)
Basophils Absolute: 0.1 10*3/uL (ref 0.0–0.1)
Basophils Relative: 1 %
Eosinophils Absolute: 0.1 10*3/uL (ref 0.0–0.5)
Eosinophils Relative: 2 %
HCT: 35.7 % — ABNORMAL LOW (ref 39.0–52.0)
Hemoglobin: 11.3 g/dL — ABNORMAL LOW (ref 13.0–17.0)
Immature Granulocytes: 0 %
Lymphocytes Relative: 33 %
Lymphs Abs: 2.3 10*3/uL (ref 0.7–4.0)
MCH: 27.6 pg (ref 26.0–34.0)
MCHC: 31.7 g/dL (ref 30.0–36.0)
MCV: 87.1 fL (ref 80.0–100.0)
Monocytes Absolute: 0.5 10*3/uL (ref 0.1–1.0)
Monocytes Relative: 7 %
Neutro Abs: 3.9 10*3/uL (ref 1.7–7.7)
Neutrophils Relative %: 57 %
Platelet Count: 187 10*3/uL (ref 150–400)
RBC: 4.1 MIL/uL — ABNORMAL LOW (ref 4.22–5.81)
RDW: 13.8 % (ref 11.5–15.5)
WBC Count: 6.9 10*3/uL (ref 4.0–10.5)
nRBC: 0 % (ref 0.0–0.2)

## 2020-11-04 ENCOUNTER — Telehealth: Payer: Self-pay | Admitting: *Deleted

## 2020-11-04 LAB — PROSTATE-SPECIFIC AG, SERUM (LABCORP): Prostate Specific Ag, Serum: <0.1 ng/mL (ref 0.0–4.0)

## 2020-11-04 LAB — TESTOSTERONE: Testosterone: <3 ng/dL — ABNORMAL LOW (ref 264–916)

## 2020-11-04 NOTE — Telephone Encounter (Signed)
[name] patient regarding lab results and CT scan. Spoke with patient and informed him that he continues to have a good response to the treatment of his prostate cancer.  Also advised that there is a delay in getting his CT scan done due to a national shortage of IV contrast. Advised that he call to re-schedule his scan until the end of June. Pt voiced understanding and he does have the # to call central radiology scheduling for r/s his CT scan.

## 2020-11-04 NOTE — Telephone Encounter (Signed)
-----   Message from Orson Slick, MD sent at 11/04/2020  8:18 AM EDT ----- Please let [name] [name] know that his prostate labs look excellent (testosterone <3, PSA <0.1). Due to IV contrast shortages we will need to delay his CT scan by at least 1 month. We will plan to see him back in 3 months with CT scan in the interim when contrast becomes available.  ----- Message ----- From: Interface, Lab In [place] Sent: 11/03/2020  10:00 AM EDT To: Orson Slick, MD

## 2020-11-10 ENCOUNTER — Encounter (HOSPITAL_COMMUNITY): Payer: Medicaid Other

## 2020-11-10 ENCOUNTER — Ambulatory Visit (HOSPITAL_COMMUNITY): Admission: RE | Admit: 2020-11-10 | Payer: Medicaid Other | Source: Ambulatory Visit

## 2020-11-16 ENCOUNTER — Other Ambulatory Visit (HOSPITAL_COMMUNITY): Payer: Self-pay

## 2020-11-16 ENCOUNTER — Other Ambulatory Visit: Payer: Self-pay | Admitting: Hematology and Oncology

## 2020-11-16 MED ORDER — ABIRATERONE ACETATE 250 MG PO TABS
ORAL_TABLET | Freq: Every day | ORAL | 2 refills | Status: DC
  Filled 2020-11-16: qty 120, 30d supply, fill #0

## 2020-12-01 ENCOUNTER — Telehealth: Payer: Self-pay | Admitting: Physician Assistant

## 2020-12-01 ENCOUNTER — Inpatient Hospital Stay: Payer: Medicaid Other

## 2020-12-01 ENCOUNTER — Inpatient Hospital Stay: Payer: Medicaid Other | Admitting: Hematology and Oncology

## 2020-12-01 NOTE — Telephone Encounter (Signed)
Changed appt time for 6/10 appt due to provider being out sick. Called and left msg about change

## 2020-12-02 ENCOUNTER — Inpatient Hospital Stay: Payer: Medicaid Other

## 2020-12-02 ENCOUNTER — Inpatient Hospital Stay: Payer: Medicaid Other | Attending: Hematology and Oncology

## 2020-12-02 ENCOUNTER — Inpatient Hospital Stay: Payer: Medicaid Other | Admitting: Physician Assistant

## 2020-12-02 ENCOUNTER — Other Ambulatory Visit: Payer: Self-pay

## 2020-12-02 ENCOUNTER — Inpatient Hospital Stay (HOSPITAL_BASED_OUTPATIENT_CLINIC_OR_DEPARTMENT_OTHER): Payer: Medicaid Other | Admitting: Hematology and Oncology

## 2020-12-02 VITALS — BP 145/81 | HR 65 | Temp 98.7°F | Resp 17 | Wt 177.2 lb

## 2020-12-02 DIAGNOSIS — C61 Malignant neoplasm of prostate: Secondary | ICD-10-CM

## 2020-12-02 DIAGNOSIS — C7951 Secondary malignant neoplasm of bone: Secondary | ICD-10-CM | POA: Diagnosis not present

## 2020-12-02 DIAGNOSIS — R590 Localized enlarged lymph nodes: Secondary | ICD-10-CM

## 2020-12-02 DIAGNOSIS — Z5111 Encounter for antineoplastic chemotherapy: Secondary | ICD-10-CM | POA: Diagnosis present

## 2020-12-02 DIAGNOSIS — Z79899 Other long term (current) drug therapy: Secondary | ICD-10-CM | POA: Diagnosis not present

## 2020-12-02 LAB — CMP (CANCER CENTER ONLY)
ALT: 13 U/L (ref 0–44)
AST: 15 U/L (ref 15–41)
Albumin: 4 g/dL (ref 3.5–5.0)
Alkaline Phosphatase: 80 U/L (ref 38–126)
Anion gap: 9 (ref 5–15)
BUN: 22 mg/dL (ref 8–23)
CO2: 26 mmol/L (ref 22–32)
Calcium: 10.1 mg/dL (ref 8.9–10.3)
Chloride: 105 mmol/L (ref 98–111)
Creatinine: 1.47 mg/dL — ABNORMAL HIGH (ref 0.61–1.24)
GFR, Estimated: 53 mL/min — ABNORMAL LOW (ref >60)
Glucose, Bld: 111 mg/dL — ABNORMAL HIGH (ref 70–99)
Potassium: 4.2 mmol/L (ref 3.5–5.1)
Sodium: 140 mmol/L (ref 135–145)
Total Bilirubin: 0.2 mg/dL — ABNORMAL LOW (ref 0.3–1.2)
Total Protein: 8.2 g/dL — ABNORMAL HIGH (ref 6.5–8.1)

## 2020-12-02 LAB — CBC WITH DIFFERENTIAL (CANCER CENTER ONLY)
Abs Immature Granulocytes: 0.03 10*3/uL (ref 0.00–0.07)
Basophils Absolute: 0.1 10*3/uL (ref 0.0–0.1)
Basophils Relative: 1 %
Eosinophils Absolute: 0.2 10*3/uL (ref 0.0–0.5)
Eosinophils Relative: 2 %
HCT: 35.2 % — ABNORMAL LOW (ref 39.0–52.0)
Hemoglobin: 11.4 g/dL — ABNORMAL LOW (ref 13.0–17.0)
Immature Granulocytes: 0 %
Lymphocytes Relative: 31 %
Lymphs Abs: 2.3 10*3/uL (ref 0.7–4.0)
MCH: 27.8 pg (ref 26.0–34.0)
MCHC: 32.4 g/dL (ref 30.0–36.0)
MCV: 85.9 fL (ref 80.0–100.0)
Monocytes Absolute: 0.7 10*3/uL (ref 0.1–1.0)
Monocytes Relative: 9 %
Neutro Abs: 4.3 10*3/uL (ref 1.7–7.7)
Neutrophils Relative %: 57 %
Platelet Count: 206 10*3/uL (ref 150–400)
RBC: 4.1 MIL/uL — ABNORMAL LOW (ref 4.22–5.81)
RDW: 13.8 % (ref 11.5–15.5)
WBC Count: 7.5 10*3/uL (ref 4.0–10.5)
nRBC: 0 % (ref 0.0–0.2)

## 2020-12-02 MED ORDER — LEUPROLIDE ACETATE (3 MONTH) 22.5 MG ~~LOC~~ KIT
22.5000 mg | PACK | Freq: Once | SUBCUTANEOUS | Status: AC
  Administered 2020-12-02: 22.5 mg via SUBCUTANEOUS

## 2020-12-02 MED ORDER — LEUPROLIDE ACETATE (3 MONTH) 22.5 MG ~~LOC~~ KIT
PACK | SUBCUTANEOUS | Status: AC
  Filled 2020-12-02: qty 22.5

## 2020-12-02 NOTE — Progress Notes (Signed)
Pharmacy verified pt will get eligard inj due to insurance.

## 2020-12-03 LAB — PROSTATE-SPECIFIC AG, SERUM (LABCORP): Prostate Specific Ag, Serum: <0.1 ng/mL (ref 0.0–4.0)

## 2020-12-03 LAB — TESTOSTERONE: Testosterone: <3 ng/dL — ABNORMAL LOW (ref 264–916)

## 2020-12-06 ENCOUNTER — Encounter: Payer: Self-pay | Admitting: Hematology and Oncology

## 2020-12-06 ENCOUNTER — Other Ambulatory Visit (HOSPITAL_COMMUNITY): Payer: Self-pay

## 2020-12-06 MED ORDER — ABIRATERONE ACETATE 250 MG PO TABS: ORAL_TABLET | Freq: Every day | ORAL | 2 refills | Status: DC

## 2020-12-06 MED ORDER — ABIRATERONE ACETATE 250 MG PO TABS
ORAL_TABLET | Freq: Every day | ORAL | 2 refills | Status: DC
  Filled 2020-12-06: qty 120, fill #0
  Filled 2021-01-19: qty 120, 30d supply, fill #0
  Filled 2021-03-09: qty 120, 30d supply, fill #1
  Filled 2021-07-07: qty 120, 30d supply, fill #2

## 2020-12-06 NOTE — Progress Notes (Signed)
[place] [phone]   [phone]  PROGRESS NOTE  Patient Care Team: Rosilyn Mings. 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 Seen by pulmonary with plans for PET and biopsy, though he was lost to follow up.  3) 01/17/2020: sent to the ED by Orthopedic surgery due to metastasis of the spine noted on MRI. IN the ED CT C/A/P showed extensive left pelvic and retroperitoneal lymphadenopathy. Mediastinal and bilateral hilar lymphadenopathy as well as mixed lytic and sclerotic lesions are noted in the bony pelvis 4) 01/17/2020: PSA noted to be 1074.94 5) 01/22/2020: establish care with [name]. Started Bicalutamide 50mg  PO daily. Plan for 28 days of therapy.   6) 02/05/2020: Lupron 22.5mg  started.  7) 03/03/2020: received 45mg  lupron with urology. PSA 9.6 8) 03/30/2020: prostate biopsy performed, confirmed adenocarcinoma of the prostate.  9) 04/25/2020: stopped Zytiga therapy after < 7 days due to worsening depression 10) 04/28/2020: received enzalutamide 160mg , did not take any doses 11) 06/15/2020: agreed to restart Zytiga 1000mg  daily with prednisone 5mg   12) 09/08/2020: Lupron 22.5mg  shot.  13) 12/02/2020: Lupron 22.5mg  shot.   Interval History:  Braxley Balandran 65 y.o. male with medical history significant for metastatic prostate cancer who presents for a follow up visit. The patient's last visit was on 11/03/2020. In the interim since the last visit Mr. Inglett has continued abiraterone therapy without difficulty.   On exam today Mr. Coats  reports that his energy has been "not good".  He notes that this may be both physical and mental.  He just started his Celexa therapy about 1.5 weeks ago and reports that he has "no motivation".  He reports that everything else is good and he is not having any other issues with his current therapy.  His appetite has been good has been having no issues with nausea, vomiting or diarrhea.  Reports that he has not been having any bone pain or back pain. He denies any fevers, chills, sweats, nausea, vomiting or diarrhea.  Full 10 point ROS is listed below.  MEDICAL HISTORY:  Past Medical History:  Diagnosis Date   Brain tumor [facility] [place])    diagnosed by MRI 6 months ago   [facility]cer [facility])    Pt reports brain tumor 2 years ago   Coronary artery disease 2011   s/p BMS stent LAD, [facility] [place] [place], Dr Chauncey Cruel. [name]   Depression    Dyspnea    High cholesterol    History of herniated intervertebral disc    HTN (hypertension)    Hyperlipidemia LDL goal <70 07/21/2013   Lung nodule    NSTEMI (non-ST elevated myocardial infarction) ([place]) 11/11/2017   Smoker    1/2 ppd x >40 years     SURGICAL HISTORY: Past Surgical History:  Procedure Laterality Date   CORONARY ANGIOPLASTY WITH STENT PLACEMENT Left 2011   BMS LAD, [facility] [place], [place], Dr Chauncey Cruel. [name]   CORONARY STENT INTERVENTION N/A 11/12/2017   Procedure: CORONARY STENT INTERVENTION;  Surgeon: Leonie Man, MD;  Location: [place] [facility];  Service: Cardiovascular;  Laterality: N/A;   LEFT HEART CATH  AND CORONARY ANGIOGRAPHY N/A 11/12/2017   Procedure: LEFT HEART CATH AND CORONARY ANGIOGRAPHY;  Surgeon: Leonie Man, MD;  Location: [place] [facility];  Service: Cardiovascular;  Laterality: N/A;    SOCIAL HISTORY: Social History   Socioeconomic History   Marital status: Single    Spouse name: Not on file   Number of children: Not on file   Years of education: Not on file   Highest education level: Not on  file  Occupational History   Occupation: unemployed  Tobacco Use   Smoking status: Every Day    Packs/day: 0.25    Years: 45.00    Pack years: 11.25    Types: Cigarettes   Smokeless tobacco: Never  Vaping Use   Vaping Use: Never used  Substance and Sexual Activity   Alcohol use: Yes    Alcohol/week: 75.0 standard drinks    Types: 75 Shots of liquor per week    Comment: 2 shots of liquor daily   Drug use: Not Currently    Types: Marijuana    Comment: occasionally   Sexual activity: Yes  Other Topics Concern   Not on file  Social History Narrative   From [place]    Family lives in [place] (brother, sister and mother)    Unemployed    Drinks 3 (1/5)ths in 1 week   Social Determinants of Health   Financial Resource Strain: Low Risk    Difficulty of Paying Living Expenses: Not very hard  Food Insecurity: No Food Insecurity   Worried About Charity fundraiser in the Last Year: Never true   Arboriculturist in the Last Year: Never true  Transportation Needs: No Transportation Needs   Lack of Transportation (Medical): No   Lack of Transportation (Non-Medical): No  Physical Activity: Inactive   Days of Exercise per Week: 0 days   Minutes of Exercise per Session: 0 min  Stress: Stress Concern Present   Feeling of Stress : To some extent  Social Connections: Socially Isolated   Frequency of Communication with Friends and Family: Three times a week   Frequency of Social Gatherings with Friends and Family: Three times a week   Attends Religious Services: Never   Active Member of Clubs or Organizations: No   Attends Archivist Meetings: Never   Marital Status: Never married  Human resources officer Violence: Not on file    FAMILY HISTORY: Family History  Problem Relation Age of Onset   Diabetes Paternal Grandfather    Hypertension Paternal Grandfather     ALLERGIES:  has No Known Allergies.  MEDICATIONS:  Current Outpatient Medications  Medication Sig  Dispense Refill   abiraterone acetate (ZYTIGA) 250 MG tablet TAKE 4 TABLETS (1,000 MG TOTAL) BY MOUTH DAILY. TAKE ON AN EMPTY STOMACH 1 HOUR BEFORE OR 2 HOURS AFTER A MEAL 120 tablet 2   aspirin 81 MG EC tablet Take 1 tablet (81 mg total) by mouth daily. 90 tablet 3   atorvastatin (LIPITOR) 40 MG tablet TAKE 1 TABLET BY MOUTH ONCE DAILY 90 tablet 3   calcium-vitamin D (OSCAL WITH D) 500-200 MG-UNIT tablet Take 1 tablet by mouth daily with breakfast. 90 tablet 3   carvedilol (COREG) 6.25 MG tablet carvedilol 6.25 mg tablet  TAKE 1 TABLET BY MOUTH TWICE DAILY WITH A MEAL     citalopram (CELEXA) 20 MG tablet TAKE 1 TABLET BY MOUTH ONCE DAILY (Patient not taking: Reported on 06/15/2020) 30 tablet 3   clopidogrel (PLAVIX)  75 MG tablet Take 1 tablet (75 mg total) by mouth daily. 90 tablet 3   gabapentin (NEURONTIN) 300 MG capsule Take 300 mg by mouth 3 (three) times daily as needed for pain.     lisinopril (ZESTRIL) 10 MG tablet Take 1 tablet (10 mg total) by mouth daily. 90 tablet 3   nitroGLYCERIN (NITROSTAT) 0.4 MG SL tablet Place 1 tablet (0.4 mg total) under the tongue every 5 (five) minutes as needed for chest pain. 25 tablet 1   oxyCODONE 10 MG TABS Take 0.5 tablets (5 mg total) by mouth every 4 (four) hours as needed for severe pain. (Patient not taking: Reported on 04/28/2020) 30 tablet 0   predniSONE (DELTASONE) 5 MG tablet TAKE 1 TABLET BY MOUTH DAILY WITH BREAKFAST. 90 tablet 1   No current facility-administered medications for this visit.    REVIEW OF SYSTEMS:   Constitutional: ( - ) fevers, ( - )  chills , ( - ) night sweats Eyes: ( - ) blurriness of vision, ( - ) double vision, ( - ) watery eyes Ears, nose, mouth, throat, and face: ( - ) mucositis, ( - ) sore throat Respiratory: ( - ) cough, ( - ) dyspnea, ( - ) wheezes Cardiovascular: ( - ) palpitation, ( - ) chest discomfort, ( - ) lower extremity swelling Gastrointestinal:  ( - ) nausea, ( - ) heartburn, ( - ) change in bowel  habits Skin: ( - ) abnormal skin rashes Lymphatics: ( - ) new lymphadenopathy, ( - ) easy bruising Neurological: ( - ) numbness, ( - ) tingling, ( - ) new weaknesses Behavioral/Psych: ( - ) mood change, ( - ) new changes  All other systems were reviewed with the patient and are negative.  PHYSICAL EXAMINATION: ECOG PERFORMANCE STATUS: 1 - Symptomatic but completely ambulatory  Vitals:   12/02/20 1516  BP: (!) 145/81  Pulse: 65  Resp: 17  Temp: 98.7 F (37.1 C)  SpO2: 100%   Filed Weights   12/02/20 1516  Weight: 177 lb 3.2 oz (80.4 kg)    GENERAL:  Well appearing middle aged [place] American male. alert, no distress and comfortable SKIN: skin color, texture, turgor are normal, no rashes or significant lesions EYES: conjunctiva are pink and non-injected, sclera clear LUNGS: clear to auscultation and percussion with normal breathing effort HEART: regular rate & rhythm and no murmurs and no lower extremity edema Musculoskeletal: no cyanosis of digits and no clubbing  PSYCH: alert & oriented x 3, fluent speech NEURO: no focal motor/sensory deficits  LABORATORY DATA:  I have reviewed the data as listed CBC Latest Ref Rng & Units 12/02/2020 11/03/2020 10/06/2020  WBC 4.0 - 10.5 K/uL 7.5 6.9 7.1  Hemoglobin 13.0 - 17.0 g/dL 11.4(L) 11.3(L) 11.7(L)  Hematocrit 39.0 - 52.0 % 35.2(L) 35.7(L) 36.1(L)  Platelets 150 - 400 K/uL 206 187 207    CMP Latest Ref Rng & Units 12/02/2020 11/03/2020 10/06/2020  Glucose 70 - 99 mg/dL 111(H) 114(H) 99  BUN 8 - 23 mg/dL 22 22 18   Creatinine 0.61 - 1.24 mg/dL 1.47(H) 1.42(H) 1.52(H)  Sodium 135 - 145 mmol/L 140 141 140  Potassium 3.5 - 5.1 mmol/L 4.2 3.9 4.1  Chloride 98 - 111 mmol/L 105 106 104  CO2 22 - 32 mmol/L 26 25 24   Calcium 8.9 - 10.3 mg/dL 10.1 10.0 10.2  Total Protein 6.5 - 8.1 g/dL 8.2(H) 8.0 8.1  Total Bilirubin 0.3 - 1.2 mg/dL 0.2(L) 0.4 0.2(L)  Alkaline Phos  38 - 126 U/L 80 73 72  AST 15 - 41 U/L 15 15 19   ALT 0 - 44 U/L 13 13  19    RADIOGRAPHIC STUDIES: No results found.  ASSESSMENT & PLAN [name] [name] 65 y.o. male with medical history significant for metastatic prostate cancer who presents for a follow up visit.    Previously we discussed the treatment options moving forward including d/c of abiraterone and the addition of enzalutamide to his ADT therapy.  I discussed the side effects of both of these medications and the required follow-up.  After weighing the pros and cons the patient wanted to proceed with the enzalutamide therapy and stop taking the abiraterone therapy. However, he now reports he has never taken the enzalutamide as he is concerned about the side effects. He restarted the abiraterone and has been on this medication since Dec 2021.   The prostate biopsy has been obtained and confirms adenocarcinoma of the prostate. We can began treating with abiraterone 1000 mg p.o. daily with 5 mg of prednisone, however he d/c this due to worsening anxiety/depression. He will continue his q. 3 monthly 22.5 mg Lupron shots (most recent shot was a 45mg  on 03/03/2020, next due March 2022).  Additionally testosterone and PSA levels will be rechecked q 3 months. There is no clear indication for bisphosphonate therapy in the setting of castrate sensitive prostate cancer with metastatic disease to the bone.  After discussion today he was agreeable to continuing Zytiga 1000 mg daily with prednisone.  We will set him up for q 3 months follow ups.  # Metastatic Castrate Sensitive Prostate Cancer  --repeat CT C/A/P Scan and NM bone scan due (holding right now due to national contrast shortage and stability of patient's PSA and symptoms). Will repeat q 3 months initially.  --prostate biopsy performed by urology on 03/30/2020, confirms diagnosis of prostate adenocarcinoma. Gleason score not calculated due to ADT therapy  --Received dose of lupron 22.5 mg June 2022. Next shot due in Sept 2022.  --continue calcium/vitamin D for bone  protection on ADT therapy.  --continue abiraterone 1000mg  PO daily with 5 mg prednisone.  --the literature does not currently support the use of bisphosphonates in the setting of CSPC.  --have patient RTC 1 month for clinic visit. Will extend visits to q 3 months at that time if labs are stable.    #Symptom management -- oxycodone 5mg  q4H PRN for pain. He has not required these as his pain is currently under control --encourage OTC senna for constipation prevention if taking opioids.   #Goals of Care --discussed the incurable nature of metastatic disease --emphasized that all treatments moving forward are focused on symptom control, decreasing tumor size/slow spread, and increasing lifespan.  --patient voiced understanding of the palliative nature of treatments moving forward.  --patients MPOA is Chanel Sarina Ill (his daughter)   No orders of the defined types were placed in this encounter.   All questions were answered. The patient knows to call the clinic with any problems, questions or concerns.  A total of more than 30 minutes were spent on this encounter and over half of that time was spent on counseling and coordination of care as outlined above.   Ledell Peoples, MD Department of Hematology/Oncology [place] [facility] Phone: [phone] Pager: [phone] Email: [email or web address]  12/06/2020 3:37 PM   Literature Support:  Juanda Crumble A Systematic Review and Meta-Analysis about the Effect of Bisphosphonates on the Risk  of Skeletal-Related Event in Men with Prostate Cancer. Anticancer Agents Med Chem. 2020;20(13):1604-1612.  --Our study demonstrated that bisphosphonates could not statistically significantly reduce the risk of SRE in patients with prostate cancer, neither in the subgroups with M1 or CSPC.

## 2020-12-07 ENCOUNTER — Other Ambulatory Visit (HOSPITAL_COMMUNITY): Payer: Self-pay

## 2020-12-12 ENCOUNTER — Other Ambulatory Visit (HOSPITAL_COMMUNITY): Payer: Self-pay

## 2020-12-12 MED FILL — Prednisone Tab 5 MG: ORAL | 30 days supply | Qty: 30 | Fill #0 | Status: AC

## 2020-12-12 MED FILL — Citalopram Hydrobromide Tab 20 MG (Base Equiv): ORAL | 30 days supply | Qty: 30 | Fill #0 | Status: AC

## 2020-12-15 ENCOUNTER — Encounter: Payer: Self-pay | Admitting: Hematology and Oncology

## 2020-12-22 ENCOUNTER — Encounter (HOSPITAL_COMMUNITY): Payer: Medicaid Other

## 2020-12-22 ENCOUNTER — Ambulatory Visit (HOSPITAL_COMMUNITY): Payer: Medicaid Other

## 2020-12-22 ENCOUNTER — Encounter (HOSPITAL_COMMUNITY): Admission: RE | Admit: 2020-12-22 | Payer: Medicaid Other | Source: Ambulatory Visit

## 2020-12-29 ENCOUNTER — Inpatient Hospital Stay: Payer: Medicaid Other | Attending: Hematology and Oncology

## 2020-12-29 ENCOUNTER — Inpatient Hospital Stay (HOSPITAL_BASED_OUTPATIENT_CLINIC_OR_DEPARTMENT_OTHER): Payer: Medicaid Other | Admitting: Hematology and Oncology

## 2020-12-29 ENCOUNTER — Encounter: Payer: Self-pay | Admitting: Hematology and Oncology

## 2020-12-29 ENCOUNTER — Other Ambulatory Visit (HOSPITAL_COMMUNITY): Payer: Self-pay

## 2020-12-29 ENCOUNTER — Other Ambulatory Visit: Payer: Self-pay

## 2020-12-29 VITALS — BP 151/72 | HR 76 | Temp 97.6°F | Resp 17 | Ht 73.0 in | Wt 176.6 lb

## 2020-12-29 DIAGNOSIS — C61 Malignant neoplasm of prostate: Secondary | ICD-10-CM | POA: Insufficient documentation

## 2020-12-29 DIAGNOSIS — Z79899 Other long term (current) drug therapy: Secondary | ICD-10-CM | POA: Insufficient documentation

## 2020-12-29 DIAGNOSIS — C7951 Secondary malignant neoplasm of bone: Secondary | ICD-10-CM | POA: Diagnosis present

## 2020-12-29 DIAGNOSIS — R590 Localized enlarged lymph nodes: Secondary | ICD-10-CM

## 2020-12-29 LAB — CMP (CANCER CENTER ONLY)
ALT: 14 U/L (ref 0–44)
AST: 14 U/L — ABNORMAL LOW (ref 15–41)
Albumin: 4 g/dL (ref 3.5–5.0)
Alkaline Phosphatase: 79 U/L (ref 38–126)
Anion gap: 10 (ref 5–15)
BUN: 21 mg/dL (ref 8–23)
CO2: 26 mmol/L (ref 22–32)
Calcium: 10.1 mg/dL (ref 8.9–10.3)
Chloride: 104 mmol/L (ref 98–111)
Creatinine: 1.64 mg/dL — ABNORMAL HIGH (ref 0.61–1.24)
GFR, Estimated: 47 mL/min — ABNORMAL LOW (ref >60)
Glucose, Bld: 122 mg/dL — ABNORMAL HIGH (ref 70–99)
Potassium: 4.2 mmol/L (ref 3.5–5.1)
Sodium: 140 mmol/L (ref 135–145)
Total Bilirubin: 0.3 mg/dL (ref 0.3–1.2)
Total Protein: 8.1 g/dL (ref 6.5–8.1)

## 2020-12-29 LAB — CBC WITH DIFFERENTIAL (CANCER CENTER ONLY)
Abs Immature Granulocytes: 0.04 10*3/uL (ref 0.00–0.07)
Basophils Absolute: 0.1 10*3/uL (ref 0.0–0.1)
Basophils Relative: 1 %
Eosinophils Absolute: 0.2 10*3/uL (ref 0.0–0.5)
Eosinophils Relative: 2 %
HCT: 36 % — ABNORMAL LOW (ref 39.0–52.0)
Hemoglobin: 11.6 g/dL — ABNORMAL LOW (ref 13.0–17.0)
Immature Granulocytes: 1 %
Lymphocytes Relative: 26 %
Lymphs Abs: 2.1 10*3/uL (ref 0.7–4.0)
MCH: 27.8 pg (ref 26.0–34.0)
MCHC: 32.2 g/dL (ref 30.0–36.0)
MCV: 86.1 fL (ref 80.0–100.0)
Monocytes Absolute: 0.6 10*3/uL (ref 0.1–1.0)
Monocytes Relative: 7 %
Neutro Abs: 5 10*3/uL (ref 1.7–7.7)
Neutrophils Relative %: 63 %
Platelet Count: 208 10*3/uL (ref 150–400)
RBC: 4.18 MIL/uL — ABNORMAL LOW (ref 4.22–5.81)
RDW: 14.2 % (ref 11.5–15.5)
WBC Count: 7.9 10*3/uL (ref 4.0–10.5)
nRBC: 0 % (ref 0.0–0.2)

## 2020-12-29 NOTE — Progress Notes (Signed)
[place] [phone]   [phone]  PROGRESS NOTE  Patient Care Team: Rosilyn Mings. 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 Seen by pulmonary with plans for PET and biopsy, though he was lost to follow up.  3) 01/17/2020: sent to the ED by Orthopedic surgery due to metastasis of the spine noted on MRI. IN the ED CT C/A/P showed extensive left pelvic and retroperitoneal lymphadenopathy. Mediastinal and bilateral hilar lymphadenopathy as well as mixed lytic and sclerotic lesions are noted in the bony pelvis 4) 01/17/2020: PSA noted to be 1074.94 5) 01/22/2020: establish care with [name]. Started Bicalutamide 50mg  PO daily. Plan for 28 days of therapy.   6) 02/05/2020: Lupron 22.5mg  started.  7) 03/03/2020: received 45mg  lupron with urology. PSA 9.6 8) 03/30/2020: prostate biopsy performed, confirmed adenocarcinoma of the prostate.  9) 04/25/2020: stopped Zytiga therapy after < 7 days due to worsening depression 10) 04/28/2020: received enzalutamide 160mg , did not take any doses 11) 06/15/2020: agreed to restart Zytiga 1000mg  daily with prednisone 5mg   12) 09/08/2020: Lupron 22.5mg  shot.  13) 12/02/2020: Lupron 22.5mg  shot.   Interval History:  [name] [name] 65 y.o. male with medical history significant for metastatic prostate cancer who presents for a follow up visit. The patient's last visit was on 12/02/2020. In the interim since the last visit [name] [name] has continued abiraterone therapy without difficulty.   On exam today [name] [name]  reports he has been well in the interim since his last visit.  He reports that his energy has been "so-so".  He notes that he is not having any difficulty with the abiraterone pills.  His appetite has been good though he has lost approximately 4 pounds since his last visit.  He notes he is not having any issues with pain, hot flashes, or sweats.  He notes that his emotional state is improved and he is taking the Celexa and has been doing so for about 4 weeks.  He is not having issues with nausea, vomiting or diarrhea.  Reports that he has not been having any bone pain or back pain. He denies any fevers, chills, sweats, nausea, vomiting or diarrhea.  Full 10 point ROS is listed below.  MEDICAL HISTORY:  Past Medical History:  Diagnosis Date   Brain tumor [facility])    diagnosed by MRI 6 months ago   Cancer [facility])    Pt reports brain tumor 2 years ago   Coronary artery disease 2011   s/p BMS stent LAD, [facility] [place], [place], Dr Chauncey Cruel. [name]   Depression    Dyspnea    High cholesterol    History of herniated intervertebral disc    HTN (hypertension)    Hyperlipidemia LDL goal <70 07/21/2013   Lung nodule    NSTEMI (non-ST elevated myocardial infarction) ([place]) 11/11/2017   Smoker    1/2 ppd x >40 years     SURGICAL HISTORY: Past Surgical History:  Procedure Laterality Date   CORONARY ANGIOPLASTY WITH STENT PLACEMENT Left 2011   BMS LAD, [facility], [facility] [place], [place], Dr Chauncey Cruel. [name]   CORONARY STENT INTERVENTION N/A 11/12/2017  Procedure: CORONARY STENT INTERVENTION;  Surgeon: Leonie Man, MD;  Location: [place] [facility];  Service: Cardiovascular;  Laterality: N/A;   LEFT HEART CATH AND CORONARY ANGIOGRAPHY N/A 11/12/2017   Procedure: LEFT HEART CATH AND CORONARY ANGIOGRAPHY;  Surgeon: Leonie Man, MD;  Location: [place] [facility];  Service: Cardiovascular;  Laterality: N/A;    SOCIAL HISTORY: Social History   Socioeconomic History   Marital status: Single     Spouse name: Not on file   Number of children: Not on file   Years of education: Not on file   Highest education level: Not on file  Occupational History   Occupation: unemployed  Tobacco Use   Smoking status: Every Day    Packs/day: 0.25    Years: 45.00    Pack years: 11.25    Types: Cigarettes   Smokeless tobacco: Never  Vaping Use   Vaping Use: Never used  Substance and Sexual Activity   Alcohol use: Yes    Alcohol/week: 75.0 standard drinks    Types: 75 Shots of liquor per week    Comment: 2 shots of liquor daily   Drug use: Not Currently    Types: Marijuana    Comment: occasionally   Sexual activity: Yes  Other Topics Concern   Not on file  Social History Narrative   From [place]    Family lives in [place] (brother, sister and mother)    Unemployed    Drinks 3 (1/5)ths in 1 week   Social Determinants of Health   Financial Resource Strain: Low Risk    Difficulty of Paying Living Expenses: Not very hard  Food Insecurity: No Food Insecurity   Worried About Charity fundraiser in the Last Year: Never true   Arboriculturist in the Last Year: Never true  Transportation Needs: No Transportation Needs   Lack of Transportation (Medical): No   Lack of Transportation (Non-Medical): No  Physical Activity: Inactive   Days of Exercise per Week: 0 days   Minutes of Exercise per Session: 0 min  Stress: Stress Concern Present   Feeling of Stress : To some extent  Social Connections: Socially Isolated   Frequency of Communication with Friends and Family: Three times a week   Frequency of Social Gatherings with Friends and Family: Three times a week   Attends Religious Services: Never   Active Member of Clubs or Organizations: No   Attends Archivist Meetings: Never   Marital Status: Never married  Human resources officer Violence: Not on file    FAMILY HISTORY: Family History  Problem Relation Age of Onset   Diabetes Paternal Grandfather    Hypertension  Paternal Grandfather     ALLERGIES:  has No Known Allergies.  MEDICATIONS:  Current Outpatient Medications  Medication Sig Dispense Refill   abiraterone acetate (ZYTIGA) 250 MG tablet TAKE 4 TABLETS (1,000 MG TOTAL) BY MOUTH DAILY. TAKE ON AN EMPTY STOMACH 1 HOUR BEFORE OR 2 HOURS AFTER A MEAL 120 tablet 2   aspirin 81 MG EC tablet Take 1 tablet (81 mg total) by mouth daily. 90 tablet 3   atorvastatin (LIPITOR) 40 MG tablet TAKE 1 TABLET BY MOUTH ONCE DAILY 90 tablet 3   calcium-vitamin D (OSCAL WITH D) 500-200 MG-UNIT tablet Take 1 tablet by mouth daily with breakfast. 90 tablet 3   carvedilol (COREG) 6.25 MG tablet carvedilol 6.25 mg tablet  TAKE 1 TABLET BY MOUTH TWICE DAILY WITH A MEAL  citalopram (CELEXA) 20 MG tablet TAKE 1 TABLET BY MOUTH ONCE DAILY (Patient not taking: Reported on 06/15/2020) 30 tablet 3   clopidogrel (PLAVIX) 75 MG tablet Take 1 tablet (75 mg total) by mouth daily. 90 tablet 3   gabapentin (NEURONTIN) 300 MG capsule Take 300 mg by mouth 3 (three) times daily as needed for pain.     lisinopril (ZESTRIL) 10 MG tablet Take 1 tablet (10 mg total) by mouth daily. 90 tablet 3   nitroGLYCERIN (NITROSTAT) 0.4 MG SL tablet Place 1 tablet (0.4 mg total) under the tongue every 5 (five) minutes as needed for chest pain. 25 tablet 1   oxyCODONE 10 MG TABS Take 0.5 tablets (5 mg total) by mouth every 4 (four) hours as needed for severe pain. (Patient not taking: Reported on 04/28/2020) 30 tablet 0   predniSONE (DELTASONE) 5 MG tablet TAKE 1 TABLET BY MOUTH DAILY WITH BREAKFAST. 90 tablet 1   No current facility-administered medications for this visit.    REVIEW OF SYSTEMS:   Constitutional: ( - ) fevers, ( - )  chills , ( - ) night sweats Eyes: ( - ) blurriness of vision, ( - ) double vision, ( - ) watery eyes Ears, nose, mouth, throat, and face: ( - ) mucositis, ( - ) sore throat Respiratory: ( - ) cough, ( - ) dyspnea, ( - ) wheezes Cardiovascular: ( - ) palpitation, ( -  ) chest discomfort, ( - ) lower extremity swelling Gastrointestinal:  ( - ) nausea, ( - ) heartburn, ( - ) change in bowel habits Skin: ( - ) abnormal skin rashes Lymphatics: ( - ) new lymphadenopathy, ( - ) easy bruising Neurological: ( - ) numbness, ( - ) tingling, ( - ) new weaknesses Behavioral/Psych: ( - ) mood change, ( - ) new changes  All other systems were reviewed with the patient and are negative.  PHYSICAL EXAMINATION: ECOG PERFORMANCE STATUS: 1 - Symptomatic but completely ambulatory  Vitals:   12/29/20 1048  BP: (!) 151/72  Pulse: 76  Resp: 17  Temp: 97.6 F (36.4 C)  SpO2: 100%   Filed Weights   12/29/20 1048  Weight: 176 lb 9.6 oz (80.1 kg)    GENERAL:  Well appearing middle aged [place] American male. alert, no distress and comfortable SKIN: skin color, texture, turgor are normal, no rashes or significant lesions EYES: conjunctiva are pink and non-injected, sclera clear LUNGS: clear to auscultation and percussion with normal breathing effort HEART: regular rate & rhythm and no murmurs and no lower extremity edema Musculoskeletal: no cyanosis of digits and no clubbing  PSYCH: alert & oriented x 3, fluent speech NEURO: no focal motor/sensory deficits  LABORATORY DATA:  I have reviewed the data as listed CBC Latest Ref Rng & Units 12/29/2020 12/02/2020 11/03/2020  WBC 4.0 - 10.5 K/uL 7.9 7.5 6.9  Hemoglobin 13.0 - 17.0 g/dL 11.6(L) 11.4(L) 11.3(L)  Hematocrit 39.0 - 52.0 % 36.0(L) 35.2(L) 35.7(L)  Platelets 150 - 400 K/uL 208 206 187    CMP Latest Ref Rng & Units 12/29/2020 12/02/2020 11/03/2020  Glucose 70 - 99 mg/dL 122(H) 111(H) 114(H)  BUN 8 - 23 mg/dL 21 22 22   Creatinine 0.61 - 1.24 mg/dL 1.64(H) 1.47(H) 1.42(H)  Sodium 135 - 145 mmol/L 140 140 141  Potassium 3.5 - 5.1 mmol/L 4.2 4.2 3.9  Chloride 98 - 111 mmol/L 104 105 106  CO2 22 - 32 mmol/L 26 26 25   Calcium 8.9 - 10.3 mg/dL 10.1  10.1 10.0  Total Protein 6.5 - 8.1 g/dL 8.1 8.2(H) 8.0  Total  Bilirubin 0.3 - 1.2 mg/dL 0.3 0.2(L) 0.4  Alkaline Phos 38 - 126 U/L 79 80 73  AST 15 - 41 U/L 14(L) 15 15  ALT 0 - 44 U/L 14 13 13    RADIOGRAPHIC STUDIES: No results found.  ASSESSMENT & PLAN [name] [name] 65 y.o. male with medical history significant for metastatic prostate cancer who presents for a follow up visit.    Previously we discussed the treatment options moving forward including d/c of abiraterone and the addition of enzalutamide to his ADT therapy.  I discussed the side effects of both of these medications and the required follow-up.  After weighing the pros and cons the patient wanted to proceed with the enzalutamide therapy and stop taking the abiraterone therapy. However, he now reports he has never taken the enzalutamide as he is concerned about the side effects. He restarted the abiraterone and has been on this medication since Dec 2021.   The prostate biopsy has been obtained and confirms adenocarcinoma of the prostate. We can began treating with abiraterone 1000 mg p.o. daily with 5 mg of prednisone, however he d/c this due to worsening anxiety/depression. He will continue his q. 3 monthly 22.5 mg Lupron shots (most recent shot was a 45mg  on 03/03/2020, next due March 2022).  Additionally testosterone and PSA levels will be rechecked q 3 months. There is no clear indication for bisphosphonate therapy in the setting of castrate sensitive prostate cancer with metastatic disease to the bone.  After discussion today he was agreeable to continuing Zytiga 1000 mg daily with prednisone.  We will set him up for q 3 months follow ups.  # Metastatic Castrate Sensitive Prostate Cancer  --repeat CT C/A/P Scan and NM bone scan due (holding right now due to national contrast shortage and stability of patient's PSA and symptoms). Will repeat q 3 months initially.  --prostate biopsy performed by urology on 03/30/2020, confirms diagnosis of prostate adenocarcinoma. Gleason score not calculated  due to ADT therapy  --Received dose of lupron 22.5 mg June 2022. Next shot due in Sept 2022.  --testosterone at <3 at last check, PSA <0.1 --continue calcium/vitamin D for bone protection on ADT therapy.  --continue abiraterone 1000mg  PO daily with 5 mg prednisone.  --the literature does not currently support the use of bisphosphonates in the setting of CSPC.  --have patient RTC in 2 month for clinic visit and lupron shot. Clinic visits q 3 months moving forward.     #Symptom management -- oxycodone 5mg  q4H PRN for pain. He has not required these as his pain is currently under control --encourage OTC senna for constipation prevention if taking opioids.   #Goals of Care --discussed the incurable nature of metastatic disease --emphasized that all treatments moving forward are focused on symptom control, decreasing tumor size/slow spread, and increasing lifespan.  --patient voiced understanding of the palliative nature of treatments moving forward.  --patients MPOA is [name] [name] (his daughter)   No orders of the defined types were placed in this encounter.   All questions were answered. The patient knows to call the clinic with any problems, questions or concerns.  A total of more than 30 minutes were spent on this encounter and over half of that time was spent on counseling and coordination of care as outlined above.   Ledell Peoples, MD Department of Hematology/Oncology [place] at [facility] Phone: [phone] Pager:  [phone] Email: [email or web address]  12/29/2020 11:11 AM   Literature Support:  Juanda Crumble A Systematic Review and Meta-Analysis about the Effect of Bisphosphonates on the Risk of Skeletal-Related Event in Men with Prostate Cancer. Anticancer Agents Med Chem. 2020;20(13):1604-1612.  --Our study demonstrated that bisphosphonates could not statistically significantly reduce the risk of SRE in patients with prostate  cancer, neither in the subgroups with M1 or CSPC.

## 2020-12-30 ENCOUNTER — Telehealth: Payer: Self-pay | Admitting: Hematology and Oncology

## 2020-12-30 LAB — TESTOSTERONE: Testosterone: <3 ng/dL — ABNORMAL LOW (ref 264–916)

## 2020-12-30 LAB — PROSTATE-SPECIFIC AG, SERUM (LABCORP): Prostate Specific Ag, Serum: <0.1 ng/mL (ref 0.0–4.0)

## 2020-12-30 NOTE — Telephone Encounter (Signed)
Scheduled per los. Called and spoke with patient. Confirmed appt 

## 2021-01-06 ENCOUNTER — Encounter (HOSPITAL_COMMUNITY)
Admission: RE | Admit: 2021-01-06 | Discharge: 2021-01-06 | Disposition: A | Discharge status: Home / Self Care | Payer: Medicaid Other | Source: Ambulatory Visit | Attending: Hematology and Oncology | Admitting: Hematology and Oncology

## 2021-01-06 ENCOUNTER — Ambulatory Visit (HOSPITAL_COMMUNITY)
Admission: RE | Admit: 2021-01-06 | Discharge: 2021-01-06 | Disposition: A | Discharge status: Home / Self Care | Payer: Medicaid Other | Source: Ambulatory Visit | Attending: Hematology and Oncology | Admitting: Hematology and Oncology

## 2021-01-06 ENCOUNTER — Other Ambulatory Visit: Payer: Self-pay

## 2021-01-06 DIAGNOSIS — C7951 Secondary malignant neoplasm of bone: Secondary | ICD-10-CM | POA: Diagnosis present

## 2021-01-06 DIAGNOSIS — C61 Malignant neoplasm of prostate: Secondary | ICD-10-CM | POA: Insufficient documentation

## 2021-01-06 MED ORDER — SODIUM CHLORIDE (PF) 0.9 % IJ SOLN
INTRAMUSCULAR | Status: AC
  Filled 2021-01-06: qty 50

## 2021-01-06 MED ORDER — IOHEXOL 350 MG/ML SOLN
85.0000 mL | Freq: Once | INTRAVENOUS | Status: AC | PRN
  Administered 2021-01-06: 85 mL via INTRAVENOUS

## 2021-01-06 MED ORDER — TECHNETIUM TC 99M MEDRONATE IV KIT
20.5000 | PACK | Freq: Once | INTRAVENOUS | Status: AC
  Administered 2021-01-06: 20.5 via INTRAVENOUS

## 2021-01-09 ENCOUNTER — Telehealth: Payer: Self-pay | Admitting: *Deleted

## 2021-01-09 NOTE — Telephone Encounter (Signed)
[name] patient regarding scan results. Spoke with pt and informed him that his scans look excellent-no signs of disease progression, lymph nodes continue to decrease in size.  Overall, great news. Pt very happy with these results.

## 2021-01-09 NOTE — Telephone Encounter (Signed)
-----   Message from Orson Slick, MD sent at 01/09/2021 10:39 AM EDT ----- Please let [name] [name] know that his NM bone scan and CT scan look excellent. There are no signs of progression and lymph nodes continue to decrease in size. Overall great news.  ----- Message ----- From: Interface, Rad Results In Sent: 01/08/2021   6:10 AM EDT To: Orson Slick, MD

## 2021-01-19 ENCOUNTER — Other Ambulatory Visit (HOSPITAL_COMMUNITY): Payer: Self-pay

## 2021-02-13 ENCOUNTER — Other Ambulatory Visit (HOSPITAL_COMMUNITY): Payer: Self-pay

## 2021-02-15 ENCOUNTER — Other Ambulatory Visit (HOSPITAL_COMMUNITY): Payer: Self-pay

## 2021-02-17 ENCOUNTER — Other Ambulatory Visit (HOSPITAL_COMMUNITY): Payer: Self-pay

## 2021-02-28 NOTE — Progress Notes (Signed)
[place] Telephone:([phone]) [phone]   [phone]  PROGRESS NOTE  Patient Care Team: Rosilyn Mings. 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 Seen by pulmonary with plans for PET and biopsy, though he was lost to follow up.  3) 01/17/2020: sent to the ED by Orthopedic surgery due to metastasis of the spine noted on MRI. IN the ED CT C/A/P showed extensive left pelvic and retroperitoneal lymphadenopathy. Mediastinal and bilateral hilar lymphadenopathy as well as mixed lytic and sclerotic lesions are noted in the bony pelvis 4) 01/17/2020: PSA noted to be 1074.94 5) 01/22/2020: establish care with [name]. Started Bicalutamide '50mg'$  PO daily. Plan for 28 days of therapy.   6) 02/05/2020: Lupron 22.'5mg'$  started.  7) 03/03/2020: received '45mg'$  lupron with urology. PSA 9.6 8) 03/30/2020: prostate biopsy performed, confirmed adenocarcinoma of the prostate.  9) 04/25/2020: stopped Zytiga therapy after < 7 days due to worsening depression 10) 04/28/2020: received enzalutamide '160mg'$ , did not take any doses 11) 06/15/2020: agreed to restart Zytiga '1000mg'$  daily with prednisone '5mg'$   12) 09/08/2020: Lupron 22.'5mg'$  shot.  13) 12/02/2020: Lupron 22.'5mg'$  shot.  14) 03/01/2021: Lupron 22.'5mg'$  shot.  Interval History:  Khyair Brookbank 65 y.o. male with medical history significant for metastatic prostate cancer who presents for a follow up visit. The patient's last visit was on 12/29/2020. In the interim since the last visit Mr. Bollenbach has continued abiraterone therapy without difficulty.    On exam today Mr. Cammon reports he has been well in the interim since her last visit.  He notes that he does occasionally have issues with headaches but no other recent changes.  He denies any bone or back pain.  He notes he has been faithfully taking his Zytiga every day with steroids.  He has not noticed any side effects as result of his medication.  He is not having issues with nausea, vomiting or diarrhea.  Reports that he has not been having any bone pain or back pain. He denies any fevers, chills, sweats, nausea, vomiting or diarrhea.  Full 10 point ROS is listed below.  MEDICAL HISTORY:  Past Medical History:  Diagnosis Date   Brain tumor [facility])    diagnosed by MRI 6 months ago   Cancer [facility])    Pt reports brain tumor 2 years ago   Coronary artery disease 2011   s/p BMS stent LAD, [facility] [place], [place], Dr Chauncey Cruel. [name]   Depression    Dyspnea    High cholesterol    History of herniated intervertebral disc    HTN (hypertension)    Hyperlipidemia LDL goal <70 07/21/2013   Lung nodule    NSTEMI (non-ST elevated myocardial infarction) ([place]) 11/11/2017   Smoker    1/2 ppd x >40 years     SURGICAL HISTORY: Past Surgical History:  Procedure Laterality Date   CORONARY ANGIOPLASTY WITH STENT PLACEMENT Left 2011   BMS LAD, [facility] [place], [place], Dr Chauncey Cruel. [name]   CORONARY STENT INTERVENTION N/A 11/12/2017   Procedure: CORONARY STENT INTERVENTION;  Surgeon: Leonie Man, MD;  Location: [place] [facility];  Service: Cardiovascular;  Laterality:  N/A;   LEFT HEART CATH AND CORONARY ANGIOGRAPHY N/A 11/12/2017   Procedure: LEFT HEART CATH AND CORONARY ANGIOGRAPHY;  Surgeon: Leonie Man, MD;  Location: [place] [facility];  Service: Cardiovascular;  Laterality: N/A;    SOCIAL HISTORY: Social History   Socioeconomic History   Marital status: Single    Spouse name: Not on file   Number of children: Not on file   Years of education: Not on file    Highest education level: Not on file  Occupational History   Occupation: unemployed  Tobacco Use   Smoking status: Every Day    Packs/day: 0.25    Years: 45.00    Pack years: 11.25    Types: Cigarettes   Smokeless tobacco: Never  Vaping Use   Vaping Use: Never used  Substance and Sexual Activity   Alcohol use: Yes    Alcohol/week: 75.0 standard drinks    Types: 75 Shots of liquor per week    Comment: 2 shots of liquor daily   Drug use: Not Currently    Types: Marijuana    Comment: occasionally   Sexual activity: Yes  Other Topics Concern   Not on file  Social History Narrative   From [place]    Family lives in [place] (brother, sister and mother)    Unemployed    Drinks 3 (1/5)ths in 1 week   Social Determinants of Health   Financial Resource Strain: Not on file  Food Insecurity: Not on file  Transportation Needs: Not on file  Physical Activity: Not on file  Stress: Not on file  Social Connections: Not on file  Intimate Partner Violence: Not on file    FAMILY HISTORY: Family History  Problem Relation Age of Onset   Diabetes Paternal Grandfather    Hypertension Paternal Grandfather     ALLERGIES:  has No Known Allergies.  MEDICATIONS:  Current Outpatient Medications  Medication Sig Dispense Refill   abiraterone acetate (ZYTIGA) 250 MG tablet TAKE 4 TABLETS (1,000 MG TOTAL) BY MOUTH DAILY. TAKE ON AN EMPTY STOMACH 1 HOUR BEFORE OR 2 HOURS AFTER A MEAL 120 tablet 2   aspirin 81 MG EC tablet Take 1 tablet (81 mg total) by mouth daily. 90 tablet 3   atorvastatin (LIPITOR) 40 MG tablet TAKE 1 TABLET BY MOUTH ONCE DAILY 90 tablet 3   calcium-vitamin D (OSCAL WITH D) 500-200 MG-UNIT tablet Take 1 tablet by mouth daily with breakfast. 90 tablet 3   carvedilol (COREG) 6.25 MG tablet carvedilol 6.25 mg tablet  TAKE 1 TABLET BY MOUTH TWICE DAILY WITH A MEAL     citalopram (CELEXA) 20 MG tablet TAKE 1 TABLET BY MOUTH ONCE DAILY (Patient not taking: Reported on  06/15/2020) 30 tablet 3   clopidogrel (PLAVIX) 75 MG tablet Take 1 tablet (75 mg total) by mouth daily. 90 tablet 3   gabapentin (NEURONTIN) 300 MG capsule Take 300 mg by mouth 3 (three) times daily as needed for pain.     lisinopril (ZESTRIL) 10 MG tablet Take 1 tablet (10 mg total) by mouth daily. 90 tablet 3   nitroGLYCERIN (NITROSTAT) 0.4 MG SL tablet Place 1 tablet (0.4 mg total) under the tongue every 5 (five) minutes as needed for chest pain. 25 tablet 1   oxyCODONE 10 MG TABS Take 0.5 tablets (5 mg total) by mouth every 4 (four) hours as needed for severe pain. (Patient not taking: Reported on 04/28/2020) 30 tablet 0   predniSONE (DELTASONE) 5 MG tablet  TAKE 1 TABLET BY MOUTH DAILY WITH BREAKFAST. 90 tablet 1   No current facility-administered medications for this visit.    REVIEW OF SYSTEMS:   Constitutional: ( - ) fevers, ( - )  chills , ( - ) night sweats Eyes: ( - ) blurriness of vision, ( - ) double vision, ( - ) watery eyes Ears, nose, mouth, throat, and face: ( - ) mucositis, ( - ) sore throat Respiratory: ( - ) cough, ( - ) dyspnea, ( - ) wheezes Cardiovascular: ( - ) palpitation, ( - ) chest discomfort, ( - ) lower extremity swelling Gastrointestinal:  ( - ) nausea, ( - ) heartburn, ( - ) change in bowel habits Skin: ( - ) abnormal skin rashes Lymphatics: ( - ) new lymphadenopathy, ( - ) easy bruising Neurological: ( - ) numbness, ( - ) tingling, ( - ) new weaknesses Behavioral/Psych: ( - ) mood change, ( - ) new changes  All other systems were reviewed with the patient and are negative.  PHYSICAL EXAMINATION: ECOG PERFORMANCE STATUS: 1 - Symptomatic but completely ambulatory  Vitals:   03/01/21 1120  BP: (!) 125/59  Pulse: (!) 52  Resp: 20  Temp: 99.9 F (37.7 C)  SpO2: 94%    Filed Weights   03/01/21 1120  Weight: 182 lb 6.4 oz (82.7 kg)     GENERAL:  Well appearing middle aged [place] American male. alert, no distress and comfortable SKIN: skin color,  texture, turgor are normal, no rashes or significant lesions EYES: conjunctiva are pink and non-injected, sclera clear LUNGS: clear to auscultation and percussion with normal breathing effort HEART: regular rate & rhythm and no murmurs and no lower extremity edema Musculoskeletal: no cyanosis of digits and no clubbing  PSYCH: alert & oriented x 3, fluent speech NEURO: no focal motor/sensory deficits  LABORATORY DATA:  I have reviewed the data as listed CBC Latest Ref Rng & Units 03/01/2021 12/29/2020 12/02/2020  WBC 4.0 - 10.5 K/uL 5.4 7.9 7.5  Hemoglobin 13.0 - 17.0 g/dL 11.5(L) 11.6(L) 11.4(L)  Hematocrit 39.0 - 52.0 % 35.6(L) 36.0(L) 35.2(L)  Platelets 150 - 400 K/uL 173 208 206    CMP Latest Ref Rng & Units 03/01/2021 12/29/2020 12/02/2020  Glucose 70 - 99 mg/dL 119(H) 122(H) 111(H)  BUN 8 - 23 mg/dL '23 21 22  '$ Creatinine 0.61 - 1.24 mg/dL 2.11(H) 1.64(H) 1.47(H)  Sodium 135 - 145 mmol/L 136 140 140  Potassium 3.5 - 5.1 mmol/L 4.0 4.2 4.2  Chloride 98 - 111 mmol/L 101 104 105  CO2 22 - 32 mmol/L '22 26 26  '$ Calcium 8.9 - 10.3 mg/dL 10.0 10.1 10.1  Total Protein 6.5 - 8.1 g/dL 8.2(H) 8.1 8.2(H)  Total Bilirubin 0.3 - 1.2 mg/dL 0.2(L) 0.3 0.2(L)  Alkaline Phos 38 - 126 U/L 67 79 80  AST 15 - 41 U/L 28 14(L) 15  ALT 0 - 44 U/L '28 14 13   '$ RADIOGRAPHIC STUDIES: No results found.  ASSESSMENT & PLAN Ademola Spadaro 65 y.o. male with medical history significant for metastatic prostate cancer who presents for a follow up visit.    Previously we discussed the treatment options moving forward including d/c of abiraterone and the addition of enzalutamide to his ADT therapy.  I discussed the side effects of both of these medications and the required follow-up.  After weighing the pros and cons the patient wanted to proceed with the enzalutamide therapy and stop taking the abiraterone therapy. However, he now reports  he has never taken the enzalutamide as he is concerned about the side effects. He  restarted the abiraterone and has been on this medication since Dec 2021.   The prostate biopsy has been obtained and confirms adenocarcinoma of the prostate. We can began treating with abiraterone 1000 mg p.o. daily with 5 mg of prednisone, however he d/c this due to worsening anxiety/depression. He will continue his q. 3 monthly 22.5 mg Lupron shots (most recent shot was a '45mg'$  on 03/03/2020, next due March 2022).  Additionally testosterone and PSA levels will be rechecked q 3 months. There is no clear indication for bisphosphonate therapy in the setting of castrate sensitive prostate cancer with metastatic disease to the bone.  After discussion today he was agreeable to continuing Zytiga 1000 mg daily with prednisone.  We will set him up for q 3 months follow ups.  # Metastatic Castrate Sensitive Prostate Cancer  --repeat CT C/A/P Scan and NM bone scan due (holding right now due to national contrast shortage and stability of patient's PSA and symptoms). Will repeat q 3 months initially.  --prostate biopsy performed by urology on 03/30/2020, confirms diagnosis of prostate adenocarcinoma. Gleason score not calculated due to ADT therapy  --Received dose of lupron 22.5 mg June 2022. Next shot due in Dec 2022.  --testosterone at <3 at last check, PSA <0.1 on 12/29/2020 --continue calcium/vitamin D for bone protection on ADT therapy.  --continue abiraterone '1000mg'$  PO daily with 5 mg prednisone.  --the literature does not currently support the use of bisphosphonates in the setting of CSPC.  --have patient RTC 3 months    #Symptom management -- oxycodone '5mg'$  q4H PRN for pain. He has not required these as his pain is currently under control --encourage OTC senna for constipation prevention if taking opioids.   #Goals of Care --discussed the incurable nature of metastatic disease --emphasized that all treatments moving forward are focused on symptom control, decreasing tumor size/slow spread, and increasing  lifespan.  --patient voiced understanding of the palliative nature of treatments moving forward.  --patients MPOA is Chanel Sarina Ill (his daughter)   No orders of the defined types were placed in this encounter.   All questions were answered. The patient knows to call the clinic with any problems, questions or concerns.  A total of more than 30 minutes were spent on this encounter and over half of that time was spent on counseling and coordination of care as outlined above.   Ledell Peoples, MD Department of Hematology/Oncology [place] [facility] Phone: [phone] Pager: [phone] Email: [email or web address]  03/01/2021 1:33 PM   Literature Support:  Juanda Crumble A Systematic Review and Meta-Analysis about the Effect of Bisphosphonates on the Risk of Skeletal-Related Event in Men with Prostate Cancer. Anticancer Agents Med Chem. 2020;20(13):1604-1612.  --Our study demonstrated that bisphosphonates could not statistically significantly reduce the risk of SRE in patients with prostate cancer, neither in the subgroups with M1 or CSPC.

## 2021-03-01 ENCOUNTER — Inpatient Hospital Stay: Payer: Medicaid Other

## 2021-03-01 ENCOUNTER — Other Ambulatory Visit: Payer: Self-pay

## 2021-03-01 ENCOUNTER — Inpatient Hospital Stay: Payer: Medicaid Other | Attending: Hematology and Oncology | Admitting: Hematology and Oncology

## 2021-03-01 VITALS — BP 125/59 | HR 52 | Temp 99.9°F | Resp 20 | Wt 182.4 lb

## 2021-03-01 DIAGNOSIS — Z79899 Other long term (current) drug therapy: Secondary | ICD-10-CM | POA: Diagnosis not present

## 2021-03-01 DIAGNOSIS — C61 Malignant neoplasm of prostate: Secondary | ICD-10-CM

## 2021-03-01 DIAGNOSIS — R590 Localized enlarged lymph nodes: Secondary | ICD-10-CM | POA: Diagnosis not present

## 2021-03-01 DIAGNOSIS — C7951 Secondary malignant neoplasm of bone: Secondary | ICD-10-CM

## 2021-03-01 DIAGNOSIS — Z5111 Encounter for antineoplastic chemotherapy: Secondary | ICD-10-CM | POA: Insufficient documentation

## 2021-03-01 LAB — CMP (CANCER CENTER ONLY)
ALT: 28 U/L (ref 0–44)
AST: 28 U/L (ref 15–41)
Albumin: 4.1 g/dL (ref 3.5–5.0)
Alkaline Phosphatase: 67 U/L (ref 38–126)
Anion gap: 13 (ref 5–15)
BUN: 23 mg/dL (ref 8–23)
CO2: 22 mmol/L (ref 22–32)
Calcium: 10 mg/dL (ref 8.9–10.3)
Chloride: 101 mmol/L (ref 98–111)
Creatinine: 2.11 mg/dL — ABNORMAL HIGH (ref 0.61–1.24)
GFR, Estimated: 35 mL/min — ABNORMAL LOW (ref >60)
Glucose, Bld: 119 mg/dL — ABNORMAL HIGH (ref 70–99)
Potassium: 4 mmol/L (ref 3.5–5.1)
Sodium: 136 mmol/L (ref 135–145)
Total Bilirubin: 0.2 mg/dL — ABNORMAL LOW (ref 0.3–1.2)
Total Protein: 8.2 g/dL — ABNORMAL HIGH (ref 6.5–8.1)

## 2021-03-01 LAB — CBC WITH DIFFERENTIAL (CANCER CENTER ONLY)
Abs Immature Granulocytes: 0.03 10*3/uL (ref 0.00–0.07)
Basophils Absolute: 0.1 10*3/uL (ref 0.0–0.1)
Basophils Relative: 1 %
Eosinophils Absolute: 0 10*3/uL (ref 0.0–0.5)
Eosinophils Relative: 0 %
HCT: 35.6 % — ABNORMAL LOW (ref 39.0–52.0)
Hemoglobin: 11.5 g/dL — ABNORMAL LOW (ref 13.0–17.0)
Immature Granulocytes: 1 %
Lymphocytes Relative: 20 %
Lymphs Abs: 1.1 10*3/uL (ref 0.7–4.0)
MCH: 27.7 pg (ref 26.0–34.0)
MCHC: 32.3 g/dL (ref 30.0–36.0)
MCV: 85.8 fL (ref 80.0–100.0)
Monocytes Absolute: 0.8 10*3/uL (ref 0.1–1.0)
Monocytes Relative: 14 %
Neutro Abs: 3.5 10*3/uL (ref 1.7–7.7)
Neutrophils Relative %: 64 %
Platelet Count: 173 10*3/uL (ref 150–400)
RBC: 4.15 MIL/uL — ABNORMAL LOW (ref 4.22–5.81)
RDW: 14.8 % (ref 11.5–15.5)
WBC Count: 5.4 10*3/uL (ref 4.0–10.5)
nRBC: 0 % (ref 0.0–0.2)

## 2021-03-01 MED ORDER — LEUPROLIDE ACETATE (3 MONTH) 22.5 MG ~~LOC~~ KIT
22.5000 mg | PACK | Freq: Once | SUBCUTANEOUS | Status: AC
  Administered 2021-03-01: 22.5 mg via SUBCUTANEOUS
  Filled 2021-03-01: qty 22.5

## 2021-03-02 LAB — TESTOSTERONE: Testosterone: <3 ng/dL — ABNORMAL LOW (ref 264–916)

## 2021-03-02 LAB — PROSTATE-SPECIFIC AG, SERUM (LABCORP): Prostate Specific Ag, Serum: <0.1 ng/mL (ref 0.0–4.0)

## 2021-03-07 ENCOUNTER — Other Ambulatory Visit (HOSPITAL_COMMUNITY): Payer: Self-pay

## 2021-03-09 ENCOUNTER — Other Ambulatory Visit (HOSPITAL_COMMUNITY): Payer: Self-pay

## 2021-03-09 MED FILL — Prednisone Tab 5 MG: ORAL | 30 days supply | Qty: 30 | Fill #1 | Status: AC

## 2021-04-05 ENCOUNTER — Other Ambulatory Visit (HOSPITAL_COMMUNITY): Payer: Self-pay

## 2021-04-11 ENCOUNTER — Other Ambulatory Visit: Payer: Self-pay | Admitting: Neurosurgery

## 2021-04-11 ENCOUNTER — Other Ambulatory Visit (HOSPITAL_COMMUNITY): Payer: Self-pay | Admitting: Neurosurgery

## 2021-04-11 DIAGNOSIS — D329 Benign neoplasm of meninges, unspecified: Secondary | ICD-10-CM

## 2021-04-18 ENCOUNTER — Other Ambulatory Visit: Payer: Self-pay

## 2021-04-18 ENCOUNTER — Ambulatory Visit (HOSPITAL_COMMUNITY)
Admission: RE | Admit: 2021-04-18 | Discharge: 2021-04-18 | Disposition: A | Discharge status: Home / Self Care | Payer: Medicaid Other | Source: Ambulatory Visit | Attending: Neurosurgery | Admitting: Neurosurgery

## 2021-04-18 DIAGNOSIS — D329 Benign neoplasm of meninges, unspecified: Secondary | ICD-10-CM | POA: Diagnosis present

## 2021-04-18 MED ORDER — GADOBUTROL 1 MMOL/ML IV SOLN
8.5000 mL | Freq: Once | INTRAVENOUS | Status: AC | PRN
  Administered 2021-04-18: 8.5 mL via INTRAVENOUS

## 2021-04-21 ENCOUNTER — Other Ambulatory Visit: Payer: Self-pay | Admitting: Radiation Therapy

## 2021-04-28 ENCOUNTER — Encounter: Payer: Self-pay | Admitting: Cardiovascular Disease

## 2021-04-28 ENCOUNTER — Other Ambulatory Visit: Payer: Self-pay

## 2021-04-28 ENCOUNTER — Ambulatory Visit (INDEPENDENT_AMBULATORY_CARE_PROVIDER_SITE_OTHER): Payer: Medicaid Other | Admitting: Cardiovascular Disease

## 2021-04-28 DIAGNOSIS — I10 Essential (primary) hypertension: Secondary | ICD-10-CM

## 2021-04-28 DIAGNOSIS — E785 Hyperlipidemia, unspecified: Secondary | ICD-10-CM

## 2021-04-28 DIAGNOSIS — I2510 Atherosclerotic heart disease of native coronary artery without angina pectoris: Secondary | ICD-10-CM

## 2021-04-28 DIAGNOSIS — F17200 Nicotine dependence, unspecified, uncomplicated: Secondary | ICD-10-CM | POA: Diagnosis not present

## 2021-04-28 DIAGNOSIS — Z9861 Coronary angioplasty status: Secondary | ICD-10-CM

## 2021-04-28 LAB — LIPID PANEL
Chol/HDL Ratio: 7.8 ratio — ABNORMAL HIGH (ref 0.0–5.0)
Cholesterol, Total: 272 mg/dL — ABNORMAL HIGH (ref 100–199)
HDL: 35 mg/dL — ABNORMAL LOW (ref >39)
LDL Chol Calc (NIH): 164 mg/dL — ABNORMAL HIGH (ref 0–99)
Triglycerides: 379 mg/dL — ABNORMAL HIGH (ref 0–149)
VLDL Cholesterol Cal: 73 mg/dL — ABNORMAL HIGH (ref 5–40)

## 2021-04-28 LAB — HEPATIC FUNCTION PANEL
ALT: 19 IU/L (ref 0–44)
AST: 16 IU/L (ref 0–40)
Albumin: 4.5 g/dL (ref 3.8–4.8)
Alkaline Phosphatase: 85 IU/L (ref 44–121)
Bilirubin Total: <0.2 mg/dL (ref 0.0–1.2)
Bilirubin, Direct: <0.1 mg/dL (ref 0.00–0.40)
Total Protein: 7.5 g/dL (ref 6.0–8.5)

## 2021-04-28 MED ORDER — LISINOPRIL 10 MG PO TABS: 10.0000 mg | ORAL_TABLET | Freq: Every day | ORAL | 3 refills | Status: DC

## 2021-04-28 MED ORDER — ATORVASTATIN CALCIUM 40 MG PO TABS: ORAL_TABLET | Freq: Every day | ORAL | 3 refills | Status: DC

## 2021-04-28 MED ORDER — NITROGLYCERIN 0.4 MG SL SUBL: 0.4000 mg | SUBLINGUAL_TABLET | SUBLINGUAL | 1 refills | Status: DC | PRN

## 2021-04-28 MED ORDER — CARVEDILOL 6.25 MG PO TABS: 6.2500 mg | ORAL_TABLET | Freq: Two times a day (BID) | ORAL | 3 refills | Status: DC

## 2021-04-28 NOTE — Assessment & Plan Note (Signed)
History of hyperlipidemia on statin therapy which she admits to being noncompliant with with lipid profile performed 12/23/2019 revealing total cholesterol 244, LDL 175 and HDL of 42.

## 2021-04-28 NOTE — Assessment & Plan Note (Signed)
History of CAD status post LAD stenting 04/05/2010 at [facility] in [place] with a MultiLink vision bare-metal stent.  He was admitted to [facility] with a non-STEMI 11/11/2017 and underwent cardiac catheterization by Dr. Ellyn Hack the following day revealing a patent LAD stent with high-grade tandem lesions in his circumflex obtuse marginal branch which were stented with overlapping synergy drug-eluting stents.  He had this lesion in the AV groove circumflex which was not treated.  He denies chest pain or shortness of breath.

## 2021-04-28 NOTE — Assessment & Plan Note (Signed)
.    He is onHistory of essential hypertension a blood pressure measured today 140/64 carvedilol and lisinopril.

## 2021-04-28 NOTE — Assessment & Plan Note (Signed)
Ongoing tobacco abuse of 1/2 pack/day recalcitrant risk factor modification 

## 2021-04-28 NOTE — Progress Notes (Signed)
04/28/2021 [name]   1956/04/13  [id number]  Primary Physician [facility] Primary Cardiologist: Lorretta Harp MD Lupe Carney, [place]  HPI:  [name] [name] is a 65 y.o.  thin appearing single African-American male father of 2 children, grandfather 3 grandchildren who currently does not work.  I last saw him in the office 04/26/2020. His risk factors include 45-pack-year tobacco abuse currently smoking 4 to 5 cigarettes a day down from a pack a day 6 months ago.  History of hypertension hyperlipidemia.  He had a stent implanted in his LAD 04/05/2010 at [facility] (MultiLink vision).  He was admitted to [facility] 11/11/2017 with chest pain and non-STEMI and underwent cardiac catheterization by Dr. Ellyn Hack the following day revealing patent LAD stent with high-grade tandem lesions in the circumflex obtuse marginal branch which were stented with overlapping synergy drug-eluting stents.  The distal lesion in the AV groove was left untreated.  He had normal LV function.  Her last several months he is noted increasing dyspnea on exertion but denies chest pain.   A 2D echo was performed 02/01/2020 that was essentially normal.    I was going to get a Myoview stress test but this was never performed however in the interim the shortness of breath which she was complaining of has completely resolved.  He currently denies chest pain.  Unfortunately, he never started his statin drug with lipid profile performed 12/23/2019 revealed a total cholesterol of 244, LDL 175 and HDL 42..  Since I saw him a year ago he has remained asymptomatic.  He does have prostate cancer.  He has gained 16 pounds and admits to not exercising.  He continues to smoke 1/2 pack/day.  He also has been noncompliant with his medications.   Current Meds  Medication Sig   calcium-vitamin D (OSCAL WITH D) 500-200 MG-UNIT tablet Take 1 tablet by mouth daily with breakfast.   clopidogrel (PLAVIX) 75  MG tablet Take 1 tablet (75 mg total) by mouth daily.     No Known Allergies  Social History   Socioeconomic History   Marital status: Single    Spouse name: Not on file   Number of children: Not on file   Years of education: Not on file   Highest education level: Not on file  Occupational History   Occupation: unemployed  Tobacco Use   Smoking status: Every Day    Packs/day: 0.25    Years: 45.00    Pack years: 11.25    Types: Cigarettes   Smokeless tobacco: Never  Vaping Use   Vaping Use: Never used  Substance and Sexual Activity   Alcohol use: Yes    Alcohol/week: 75.0 standard drinks    Types: 75 Shots of liquor per week    Comment: 2 shots of liquor daily   Drug use: Not Currently    Types: Marijuana    Comment: occasionally   Sexual activity: Yes  Other Topics Concern   Not on file  Social History Narrative   From [place]    Family lives in [place] (brother, sister and mother)    Unemployed    Drinks 3 (1/5)ths in 1 week   Social Determinants of Health   Financial Resource Strain: Not on file  Food Insecurity: Not on file  Transportation Needs: Not on file  Physical Activity: Not on file  Stress: Not on file  Social Connections: Not on file  Intimate Partner Violence: Not on  file     Review of Systems: General: negative for chills, fever, night sweats or weight changes.  Cardiovascular: negative for chest pain, dyspnea on exertion, edema, orthopnea, palpitations, paroxysmal nocturnal dyspnea or shortness of breath Dermatological: negative for rash Respiratory: negative for cough or wheezing Urologic: negative for hematuria Abdominal: negative for nausea, vomiting, diarrhea, bright red blood per rectum, melena, or hematemesis Neurologic: negative for visual changes, syncope, or dizziness All other systems reviewed and are otherwise negative except as noted above.    Blood pressure 140/64, pulse 83, height 6\' 1"  (1.854 m), weight 191 lb 3.2 oz  (86.7 kg), SpO2 97 %.  General appearance: alert and no distress Neck: no adenopathy, no carotid bruit, no JVD, supple, symmetrical, trachea midline, and thyroid not enlarged, symmetric, no tenderness/mass/nodules Lungs: clear to auscultation bilaterally Heart: regular rate and rhythm, S1, S2 normal, no murmur, click, rub or gallop Extremities: extremities normal, atraumatic, no cyanosis or edema Pulses: 2+ and symmetric Skin: Skin color, texture, turgor normal. No rashes or lesions Neurologic: Grossly normal  EKG sinus rhythm at 83 with nonspecific ST and T wave changes and marked sinus arrhythmia.  There were occasional PACs.  I personally reviewed this EKG.  ASSESSMENT AND PLAN:   Essential hypertension .  He is onHistory of essential hypertension a blood pressure measured today 140/64 carvedilol and lisinopril.  Current smoker Ongoing tobacco abuse of 1/2 pack/day recalcitrant risk factor modification.  CAD S/P percutaneous coronary angioplasty History of CAD status post LAD stenting 04/05/2010 at [facility] in [place] with a MultiLink vision bare-metal stent.  He was admitted to [facility] with a non-STEMI 11/11/2017 and underwent cardiac catheterization by Dr. Ellyn Hack the following day revealing a patent LAD stent with high-grade tandem lesions in his circumflex obtuse marginal branch which were stented with overlapping synergy drug-eluting stents.  He had this lesion in the AV groove circumflex which was not treated.  He denies chest pain or shortness of breath.  Hyperlipidemia LDL goal <70 History of hyperlipidemia on statin therapy which she admits to being noncompliant with with lipid profile performed 12/23/2019 revealing total cholesterol 244, LDL 175 and HDL of 42.     Lorretta Harp MD FACP,FACC,FAHA, [facility] 04/28/2021 10:34 AM

## 2021-04-28 NOTE — Patient Instructions (Signed)
Medication Instructions:   -Restart all cardiac medications- refills    *If you need a refill on your cardiac medications before your next appointment, please call your pharmacy*   Lab Work: Your physician recommends that you have labs drawn today: Lipid/liver profile  If you have labs (blood work) drawn today and your tests are completely normal, you will receive your results only by: [place] (if you have MyChart) OR A paper copy in the mail If you have any lab test that is abnormal or we need to change your treatment, we will call you to review the results.   Follow-Up: At [facility], you and your health needs are our priority.  As part of our continuing mission to provide you with exceptional heart care, we have created designated Provider Care Teams.  These Care Teams include your primary Cardiologist (physician) and Advanced Practice Providers (APPs -  Physician Assistants and Nurse Practitioners) who all work together to provide you with the care you need, when you need it.  We recommend signing up for the patient portal called "MyChart".  Sign up information is provided on this After Visit Summary.  MyChart is used to connect with patients for Virtual Visits (Telemedicine).  Patients are able to view lab/test results, encounter notes, upcoming appointments, etc.  Non-urgent messages can be sent to your provider as well.   To learn more about what you can do with MyChart, go to [email or web address].    Your next appointment:   12 month(s)  The format for your next appointment:   In Person  Provider:   Quay Burow, MD

## 2021-05-01 ENCOUNTER — Other Ambulatory Visit (HOSPITAL_COMMUNITY): Payer: Self-pay

## 2021-05-10 ENCOUNTER — Telehealth: Payer: Self-pay

## 2021-05-10 DIAGNOSIS — I2510 Atherosclerotic heart disease of native coronary artery without angina pectoris: Secondary | ICD-10-CM

## 2021-05-10 DIAGNOSIS — E785 Hyperlipidemia, unspecified: Secondary | ICD-10-CM

## 2021-05-10 DIAGNOSIS — Z9861 Coronary angioplasty status: Secondary | ICD-10-CM

## 2021-05-10 MED ORDER — ATORVASTATIN CALCIUM 80 MG PO TABS: 80.0000 mg | ORAL_TABLET | Freq: Every day | ORAL | 3 refills | Status: DC

## 2021-05-10 NOTE — Telephone Encounter (Signed)
Left detailed message for pt. Will increase atorvastatin (lipitor) to 80mg  once daily and repeat labs in 3 months.

## 2021-05-10 NOTE — Telephone Encounter (Signed)
Spoke with pt regarding lab results and increasing atorvastatin to 80mg  once daily. Per Dr. Gwenlyn Found we will repeat labs in 3 months. Pt verbalizes understanding.

## 2021-05-10 NOTE — Telephone Encounter (Signed)
-----   Message from Lorretta Harp, MD sent at 04/28/2021  6:59 PM EDT ----- Needs to be on Atorva 80 mg and recheck FLP 3 months. Stress importance of compliance

## 2021-05-24 ENCOUNTER — Ambulatory Visit: Payer: Medicaid Other | Admitting: Physician Assistant

## 2021-05-24 ENCOUNTER — Other Ambulatory Visit: Payer: Self-pay

## 2021-05-24 ENCOUNTER — Inpatient Hospital Stay (HOSPITAL_BASED_OUTPATIENT_CLINIC_OR_DEPARTMENT_OTHER): Payer: Medicaid Other | Admitting: Hematology and Oncology

## 2021-05-24 ENCOUNTER — Inpatient Hospital Stay: Payer: Medicaid Other

## 2021-05-24 ENCOUNTER — Inpatient Hospital Stay: Payer: Medicaid Other | Attending: Hematology and Oncology

## 2021-05-24 VITALS — BP 142/72 | HR 61 | Temp 98.5°F | Resp 17

## 2021-05-24 VITALS — BP 136/75 | HR 67 | Temp 98.5°F | Resp 17 | Wt 186.3 lb

## 2021-05-24 DIAGNOSIS — C7951 Secondary malignant neoplasm of bone: Secondary | ICD-10-CM | POA: Insufficient documentation

## 2021-05-24 DIAGNOSIS — Z5111 Encounter for antineoplastic chemotherapy: Secondary | ICD-10-CM | POA: Diagnosis present

## 2021-05-24 DIAGNOSIS — Z79899 Other long term (current) drug therapy: Secondary | ICD-10-CM | POA: Diagnosis not present

## 2021-05-24 DIAGNOSIS — C61 Malignant neoplasm of prostate: Secondary | ICD-10-CM

## 2021-05-24 DIAGNOSIS — R590 Localized enlarged lymph nodes: Secondary | ICD-10-CM | POA: Diagnosis not present

## 2021-05-24 LAB — CMP (CANCER CENTER ONLY)
ALT: 18 U/L (ref 0–44)
AST: 22 U/L (ref 15–41)
Albumin: 3.7 g/dL (ref 3.5–5.0)
Alkaline Phosphatase: 88 U/L (ref 38–126)
Anion gap: 10 (ref 5–15)
BUN: 18 mg/dL (ref 8–23)
CO2: 24 mmol/L (ref 22–32)
Calcium: 9.8 mg/dL (ref 8.9–10.3)
Chloride: 106 mmol/L (ref 98–111)
Creatinine: 1.59 mg/dL — ABNORMAL HIGH (ref 0.61–1.24)
GFR, Estimated: 48 mL/min — ABNORMAL LOW (ref >60)
Glucose, Bld: 99 mg/dL (ref 70–99)
Potassium: 4.5 mmol/L (ref 3.5–5.1)
Sodium: 140 mmol/L (ref 135–145)
Total Bilirubin: 0.2 mg/dL — ABNORMAL LOW (ref 0.3–1.2)
Total Protein: 8.2 g/dL — ABNORMAL HIGH (ref 6.5–8.1)

## 2021-05-24 LAB — CBC WITH DIFFERENTIAL (CANCER CENTER ONLY)
Abs Immature Granulocytes: 0.03 10*3/uL (ref 0.00–0.07)
Basophils Absolute: 0.1 10*3/uL (ref 0.0–0.1)
Basophils Relative: 1 %
Eosinophils Absolute: 0.2 10*3/uL (ref 0.0–0.5)
Eosinophils Relative: 2 %
HCT: 32.3 % — ABNORMAL LOW (ref 39.0–52.0)
Hemoglobin: 10.4 g/dL — ABNORMAL LOW (ref 13.0–17.0)
Immature Granulocytes: 0 %
Lymphocytes Relative: 29 %
Lymphs Abs: 2.3 10*3/uL (ref 0.7–4.0)
MCH: 27.7 pg (ref 26.0–34.0)
MCHC: 32.2 g/dL (ref 30.0–36.0)
MCV: 85.9 fL (ref 80.0–100.0)
Monocytes Absolute: 0.6 10*3/uL (ref 0.1–1.0)
Monocytes Relative: 8 %
Neutro Abs: 4.8 10*3/uL (ref 1.7–7.7)
Neutrophils Relative %: 60 %
Platelet Count: 265 10*3/uL (ref 150–400)
RBC: 3.76 MIL/uL — ABNORMAL LOW (ref 4.22–5.81)
RDW: 13.5 % (ref 11.5–15.5)
WBC Count: 8 10*3/uL (ref 4.0–10.5)
nRBC: 0 % (ref 0.0–0.2)

## 2021-05-24 MED ORDER — LEUPROLIDE ACETATE (3 MONTH) 22.5 MG ~~LOC~~ KIT
22.5000 mg | PACK | Freq: Once | SUBCUTANEOUS | Status: AC
  Administered 2021-05-24: 22.5 mg via SUBCUTANEOUS

## 2021-05-24 NOTE — Progress Notes (Signed)
[place] [phone]   [phone]  PROGRESS NOTE  Patient Care Team: Rosilyn Mings. 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 Seen by pulmonary with plans for PET and biopsy, though he was lost to follow up.  3) 01/17/2020: sent to the ED by Orthopedic surgery due to metastasis of the spine noted on MRI. IN the ED CT C/A/P showed extensive left pelvic and retroperitoneal lymphadenopathy. Mediastinal and bilateral hilar lymphadenopathy as well as mixed lytic and sclerotic lesions are noted in the bony pelvis 4) 01/17/2020: PSA noted to be 1074.94 5) 01/22/2020: establish care with [name]. Started Bicalutamide 50mg  PO daily. Plan for 28 days of therapy.   6) 02/05/2020: Lupron 22.5mg  started.  7) 03/03/2020: received 45mg  lupron with urology. PSA 9.6 8) 03/30/2020: prostate biopsy performed, confirmed adenocarcinoma of the prostate.  9) 04/25/2020: stopped Zytiga therapy after < 7 days due to worsening depression 10) 04/28/2020: received enzalutamide 160mg , did not take any doses 11) 06/15/2020: agreed to restart Zytiga 1000mg  daily with prednisone 5mg   12) 09/08/2020: Lupron 22.5mg  shot.  13) 12/02/2020: Lupron 22.5mg  shot.  14) 03/01/2021: Lupron 22.5mg  shot. 15) 05/24/2021: Lupron 22.5mg  shot.  Interval History:  [name] [name] 65 y.o. male with medical history significant for metastatic prostate cancer who presents for a follow up visit. The patient's last visit was on 03/01/2021. In the interim since the last visit [name] [name] has continued  abiraterone therapy without difficulty.   On exam today [name] [name] reports he unfortunately stopped taking his Zytiga shortly after our visit on 03/01/2021.  He notes that the pills make him feel low and depressed and therefore he stopped taking them.  He notes he is not having any other side effects as result of those medications.  He denies any night sweats, hot flashes, or gynecomastia.  His energy levels have been good.  Reports that he has not been having any bone pain or back pain. He denies any fevers, chills, sweats, nausea, vomiting or diarrhea.  Full 10 point ROS is listed below.  Today we discussed the importance of continuing Zytiga therapy and the excellent control of his disease we have with Lupron and Zytiga.  He had previously been prescribed citalopram therapy but stopped taking it when he felt better.  Encouraged him to restart this therapy and continue it.  MEDICAL HISTORY:  Past Medical History:  Diagnosis Date   Brain tumor [facility]    diagnosed by MRI 6 months ago   Cancer [facility])    Pt reports brain tumor 2 years ago   Coronary artery disease 2011   s/p BMS stent LAD, [facility] [place] [place], Dr Chauncey Cruel. [name]   Depression    Dyspnea    High cholesterol    History of herniated intervertebral disc    HTN (hypertension)    Hyperlipidemia LDL goal <70 07/21/2013   Lung nodule    NSTEMI (non-ST elevated myocardial infarction) ([place]) 11/11/2017   Smoker    1/2 ppd x >40 years     SURGICAL HISTORY: Past Surgical History:  Procedure Laterality Date   CORONARY ANGIOPLASTY WITH STENT PLACEMENT Left  2011   BMS LAD, [facility], [facility] [place], [place], Dr Chauncey Cruel. [name]   CORONARY STENT INTERVENTION N/A 11/12/2017   Procedure: CORONARY STENT INTERVENTION;  Surgeon: Leonie Man, MD;  Location: [place] [facility];  Service: Cardiovascular;  Laterality: N/A;   LEFT HEART CATH AND CORONARY ANGIOGRAPHY N/A 11/12/2017   Procedure: LEFT HEART CATH AND CORONARY  ANGIOGRAPHY;  Surgeon: Leonie Man, MD;  Location: [place] [facility];  Service: Cardiovascular;  Laterality: N/A;    SOCIAL HISTORY: Social History   Socioeconomic History   Marital status: Single    Spouse name: Not on file   Number of children: Not on file   Years of education: Not on file   Highest education level: Not on file  Occupational History   Occupation: unemployed  Tobacco Use   Smoking status: Every Day    Packs/day: 0.25    Years: 45.00    Pack years: 11.25    Types: Cigarettes   Smokeless tobacco: Never  Vaping Use   Vaping Use: Never used  Substance and Sexual Activity   Alcohol use: Yes    Alcohol/week: 75.0 standard drinks    Types: 75 Shots of liquor per week    Comment: 2 shots of liquor daily   Drug use: Not Currently    Types: Marijuana    Comment: occasionally   Sexual activity: Yes  Other Topics Concern   Not on file  Social History Narrative   From [place]    Family lives in [place] (brother, sister and mother)    Unemployed    Drinks 3 (1/5)ths in 1 week   Social Determinants of Health   Financial Resource Strain: Not on file  Food Insecurity: Not on file  Transportation Needs: Not on file  Physical Activity: Not on file  Stress: Not on file  Social Connections: Not on file  Intimate Partner Violence: Not on file    FAMILY HISTORY: Family History  Problem Relation Age of Onset   Diabetes Paternal Grandfather    Hypertension Paternal Grandfather     ALLERGIES:  has No Known Allergies.  MEDICATIONS:  Current Outpatient Medications  Medication Sig Dispense Refill   abiraterone acetate (ZYTIGA) 250 MG tablet TAKE 4 TABLETS (1,000 MG TOTAL) BY MOUTH DAILY. TAKE ON AN EMPTY STOMACH 1 HOUR BEFORE OR 2 HOURS AFTER A MEAL (Patient not taking: Reported on 04/28/2021) 120 tablet 2   aspirin 81 MG EC tablet Take 1 tablet (81 mg total) by mouth daily. (Patient not taking: Reported on 04/28/2021) 90 tablet 3   atorvastatin  (LIPITOR) 80 MG tablet Take 1 tablet (80 mg total) by mouth daily. 90 tablet 3   calcium-vitamin D (OSCAL WITH D) 500-200 MG-UNIT tablet Take 1 tablet by mouth daily with breakfast. 90 tablet 3   carvedilol (COREG) 6.25 MG tablet Take 1 tablet (6.25 mg total) by mouth 2 (two) times daily with a meal. 180 tablet 3   citalopram (CELEXA) 20 MG tablet TAKE 1 TABLET BY MOUTH ONCE DAILY 30 tablet 3   clopidogrel (PLAVIX) 75 MG tablet Take 1 tablet (75 mg total) by mouth daily. 90 tablet 3   gabapentin (NEURONTIN) 300 MG capsule Take 300 mg by mouth 3 (three) times daily as needed for pain. (Patient not taking: Reported on 04/28/2021)     lisinopril (ZESTRIL) 10 MG tablet Take 1 tablet (10 mg total) by mouth daily. 90 tablet 3   nitroGLYCERIN (NITROSTAT) 0.4 MG SL tablet Place  1 tablet (0.4 mg total) under the tongue every 5 (five) minutes as needed for chest pain. 25 tablet 1   oxyCODONE 10 MG TABS Take 0.5 tablets (5 mg total) by mouth every 4 (four) hours as needed for severe pain. (Patient not taking: No sig reported) 30 tablet 0   predniSONE (DELTASONE) 5 MG tablet TAKE 1 TABLET BY MOUTH DAILY WITH BREAKFAST. (Patient not taking: Reported on 04/28/2021) 90 tablet 1   No current facility-administered medications for this visit.    REVIEW OF SYSTEMS:   Constitutional: ( - ) fevers, ( - )  chills , ( - ) night sweats Eyes: ( - ) blurriness of vision, ( - ) double vision, ( - ) watery eyes Ears, nose, mouth, throat, and face: ( - ) mucositis, ( - ) sore throat Respiratory: ( - ) cough, ( - ) dyspnea, ( - ) wheezes Cardiovascular: ( - ) palpitation, ( - ) chest discomfort, ( - ) lower extremity swelling Gastrointestinal:  ( - ) nausea, ( - ) heartburn, ( - ) change in bowel habits Skin: ( - ) abnormal skin rashes Lymphatics: ( - ) new lymphadenopathy, ( - ) easy bruising Neurological: ( - ) numbness, ( - ) tingling, ( - ) new weaknesses Behavioral/Psych: ( - ) mood change, ( - ) new changes  All  other systems were reviewed with the patient and are negative.  PHYSICAL EXAMINATION: ECOG PERFORMANCE STATUS: 1 - Symptomatic but completely ambulatory  Vitals:   05/24/21 1445  BP: 136/75  Pulse: 67  Resp: 17  Temp: 98.5 F (36.9 C)  SpO2: 100%    Filed Weights   05/24/21 1445  Weight: 186 lb 5 oz (84.5 kg)     GENERAL:  Well appearing middle aged [place] American male. alert, no distress and comfortable SKIN: skin color, texture, turgor are normal, no rashes or significant lesions EYES: conjunctiva are pink and non-injected, sclera clear LUNGS: clear to auscultation and percussion with normal breathing effort HEART: regular rate & rhythm and no murmurs and no lower extremity edema Musculoskeletal: no cyanosis of digits and no clubbing  PSYCH: alert & oriented x 3, fluent speech NEURO: no focal motor/sensory deficits  LABORATORY DATA:  I have reviewed the data as listed CBC Latest Ref Rng & Units 05/24/2021 03/01/2021 12/29/2020  WBC 4.0 - 10.5 K/uL 8.0 5.4 7.9  Hemoglobin 13.0 - 17.0 g/dL 10.4(L) 11.5(L) 11.6(L)  Hematocrit 39.0 - 52.0 % 32.3(L) 35.6(L) 36.0(L)  Platelets 150 - 400 K/uL 265 173 208    CMP Latest Ref Rng & Units 05/24/2021 04/28/2021 03/01/2021  Glucose 70 - 99 mg/dL 99 - 119(H)  BUN 8 - 23 mg/dL 18 - 23  Creatinine 0.61 - 1.24 mg/dL 1.59(H) - 2.11(H)  Sodium 135 - 145 mmol/L 140 - 136  Potassium 3.5 - 5.1 mmol/L 4.5 - 4.0  Chloride 98 - 111 mmol/L 106 - 101  CO2 22 - 32 mmol/L 24 - 22  Calcium 8.9 - 10.3 mg/dL 9.8 - 10.0  Total Protein 6.5 - 8.1 g/dL 8.2(H) 7.5 8.2(H)  Total Bilirubin 0.3 - 1.2 mg/dL 0.2(L) <0.2 0.2(L)  Alkaline Phos 38 - 126 U/L 88 85 67  AST 15 - 41 U/L 22 16 28   ALT 0 - 44 U/L 18 19 28    RADIOGRAPHIC STUDIES: No results found.  ASSESSMENT & PLAN [name] [name] 65 y.o. male with medical history significant for metastatic prostate cancer who presents for a follow up visit.  Previously we discussed the treatment options  moving forward including d/c of abiraterone and the addition of enzalutamide to his ADT therapy.  I discussed the side effects of both of these medications and the required follow-up.  After weighing the pros and cons the patient wanted to proceed with the enzalutamide therapy and stop taking the abiraterone therapy. However, he now reports he has never taken the enzalutamide as he is concerned about the side effects. He restarted the abiraterone and has been on this medication since Dec 2021.   The prostate biopsy has been obtained and confirms adenocarcinoma of the prostate. We can began treating with abiraterone 1000 mg p.o. daily with 5 mg of prednisone, however he d/c this due to worsening anxiety/depression. He will continue his q. 3 monthly 22.5 mg Lupron shots (most recent shot was a 45mg  on 03/03/2020, next due March 2022).  Additionally testosterone and PSA levels will be rechecked q 3 months. There is no clear indication for bisphosphonate therapy in the setting of castrate sensitive prostate cancer with metastatic disease to the bone.  After discussion today he was agreeable to continuing Zytiga 1000 mg daily with prednisone.  We will set him up for q 3 months follow ups.  # Metastatic Castrate Sensitive Prostate Cancer  --prostate biopsy performed by urology on 03/30/2020, confirms diagnosis of prostate adenocarcinoma. Gleason score not calculated due to ADT therapy  --the literature does not currently support the use of bisphosphonates in the setting of CSPC.  Plan: --CT C/A/P Scan and NM bone scan last performed in July 2022. Noted to have stable disease. Has stable tumor markers as well Will repeat q 6 months  --continue calcium/vitamin D for bone protection on ADT therapy.  --continue abiraterone 1000mg  PO daily with 5 mg prednisone.  --Received dose of lupron 22.5 mg Sept 2022. Next shot due in Dec 2022 (today) --testosterone at <3 at last check, PSA <0.1 on 05/24/2021 --have patient RTC  3 months   # Depression -- Recommend he restart citalopram 20 mg p.o. daily --We will discuss with Education officer, museum.  Can consider counseling if necessary   # Symptom management -- oxycodone 5mg  q4H PRN for pain. He has not required these as his pain is currently under control --encourage OTC senna for constipation prevention if taking opioids.   # Goals of Care --discussed the incurable nature of metastatic disease --emphasized that all treatments moving forward are focused on symptom control, decreasing tumor size/slow spread, and increasing lifespan.  --patient voiced understanding of the palliative nature of treatments moving forward.  --patients MPOA is Chanel Sarina Ill (his daughter)   Orders Placed This Encounter  Procedures   CT CHEST ABDOMEN PELVIS W CONTRAST    Standing Status:   Future    Standing Expiration Date:   05/25/2022    Order Specific Question:   Preferred imaging location?    Answer:   [facility]    Order Specific Question:   Is Oral Contrast requested for this exam?    Answer:   Yes, Per Radiology protocol    Order Specific Question:   Reason for Exam (SYMPTOM  OR DIAGNOSIS REQUIRED)    Answer:   metastatic prostate cancer, assess for progression   NM Bone Scan Whole Body    Standing Status:   Future    Standing Expiration Date:   05/25/2022    Order Specific Question:   If indicated for the ordered procedure, I authorize the administration of a radiopharmaceutical per Radiology protocol  Answer:   Yes    Order Specific Question:   Preferred imaging location?    Answer:   [facility]     All questions were answered. The patient knows to call the clinic with any problems, questions or concerns.  A total of more than 30 minutes were spent on this encounter and over half of that time was spent on counseling and coordination of care as outlined above.   Ledell Peoples, MD Department of Hematology/Oncology [place] at [facility] Phone: [phone] Pager: [phone] Email: [email or web address]  05/25/2021 9:03 AM   Literature Support:  Juanda Crumble A Systematic Review and Meta-Analysis about the Effect of Bisphosphonates on the Risk of Skeletal-Related Event in Men with Prostate Cancer. Anticancer Agents Med Chem. 2020;20(13):1604-1612.  --Our study demonstrated that bisphosphonates could not statistically significantly reduce the risk of SRE in patients with prostate cancer, neither in the subgroups with M1 or CSPC.

## 2021-05-25 ENCOUNTER — Other Ambulatory Visit: Payer: Self-pay | Admitting: *Deleted

## 2021-05-25 ENCOUNTER — Encounter: Payer: Self-pay | Admitting: Hematology and Oncology

## 2021-05-25 LAB — TESTOSTERONE: Testosterone: 3 ng/dL — ABNORMAL LOW (ref 264–916)

## 2021-05-25 LAB — PROSTATE-SPECIFIC AG, SERUM (LABCORP): Prostate Specific Ag, Serum: <0.1 ng/mL (ref 0.0–4.0)

## 2021-05-25 MED ORDER — CITALOPRAM HYDROBROMIDE 20 MG PO TABS: ORAL_TABLET | Freq: Every day | ORAL | 3 refills | Status: DC

## 2021-05-26 ENCOUNTER — Other Ambulatory Visit (HOSPITAL_COMMUNITY): Payer: Self-pay

## 2021-05-26 ENCOUNTER — Telehealth: Payer: Self-pay | Admitting: *Deleted

## 2021-05-26 NOTE — Telephone Encounter (Signed)
[name] to patient regarding recent labs. Spoke with him and informed him that his PSA and testosterone are in good range.  Asked pt if he has resumed his Zytiga. He states he plans to start Monday. He states that medication scares him because of feeling so depressed. He will pick up the citalopram tomorrow and start taking it.   Asked him to call us if he decides not to take the [place].  He said he would.

## 2021-05-26 NOTE — Telephone Encounter (Signed)
-----   Message from Orson Slick, MD sent at 05/25/2021  9:03 AM EST ----- Please let Mr. Mackowski know that his PSA is <0.1 and testosterone is <3. This is fortunate that it remains under control despite self-discontinuation of zytiga. Encourage compliance with Zytiga. We have called in Citolpram for him as well to help with his depressed feelings.   Please reach out to social work to see if there are any counseling/support groups available to him.  ----- Message ----- From: [place]: 05/24/2021   2:12 PM EST To: Orson Slick, MD

## 2021-05-29 ENCOUNTER — Other Ambulatory Visit (HOSPITAL_COMMUNITY): Payer: Self-pay

## 2021-05-31 ENCOUNTER — Ambulatory Visit: Payer: Medicaid Other | Admitting: Hematology and Oncology

## 2021-05-31 ENCOUNTER — Other Ambulatory Visit: Payer: Medicaid Other

## 2021-05-31 ENCOUNTER — Ambulatory Visit: Payer: Medicaid Other

## 2021-06-15 ENCOUNTER — Encounter (HOSPITAL_COMMUNITY)
Admission: RE | Admit: 2021-06-15 | Discharge: 2021-06-15 | Disposition: A | Discharge status: Home / Self Care | Payer: Medicaid Other | Source: Ambulatory Visit | Attending: Hematology and Oncology | Admitting: Hematology and Oncology

## 2021-06-15 ENCOUNTER — Other Ambulatory Visit: Payer: Self-pay

## 2021-06-15 ENCOUNTER — Ambulatory Visit (HOSPITAL_COMMUNITY)
Admission: RE | Admit: 2021-06-15 | Discharge: 2021-06-15 | Disposition: A | Discharge status: Home / Self Care | Payer: Medicaid Other | Source: Ambulatory Visit | Attending: Hematology and Oncology | Admitting: Hematology and Oncology

## 2021-06-15 DIAGNOSIS — C61 Malignant neoplasm of prostate: Secondary | ICD-10-CM | POA: Insufficient documentation

## 2021-06-15 MED ORDER — TECHNETIUM TC 99M MEDRONATE IV KIT
20.4000 | PACK | Freq: Once | INTRAVENOUS | Status: AC
  Administered 2021-06-15: 11:00:00 20.4 via INTRAVENOUS

## 2021-06-15 MED ORDER — IOHEXOL 350 MG/ML SOLN
80.0000 mL | Freq: Once | INTRAVENOUS | Status: AC | PRN
  Administered 2021-06-15: 13:00:00 80 mL via INTRAVENOUS

## 2021-06-15 MED ORDER — SODIUM CHLORIDE (PF) 0.9 % IJ SOLN
INTRAMUSCULAR | Status: AC
  Filled 2021-06-15: qty 50

## 2021-06-16 ENCOUNTER — Telehealth: Payer: Self-pay | Admitting: *Deleted

## 2021-06-16 NOTE — Telephone Encounter (Signed)
[name] patient regarding his recent scan results. Spoke with pt and informed him that his scan showed no new cancer or progressive disease. Pt voiced understanding and is pleased with the results.

## 2021-06-16 NOTE — Telephone Encounter (Signed)
-----   Message from Orson Slick, MD sent at 06/16/2021  9:33 AM EST ----- Please let [name] [name] know that his CT scan shows no evidence of new or progressive disease.  His prostate cancer is under excellent control.  Please assure that he is continuing to take his abiraterone therapy daily.  We will see him back in February 2023 ----- Message ----- From: Buel Ream, Rad Results In Sent: 06/16/2021   9:27 AM EST To: Orson Slick, MD

## 2021-07-07 ENCOUNTER — Other Ambulatory Visit (HOSPITAL_COMMUNITY): Payer: Self-pay

## 2021-07-07 MED FILL — Prednisone Tab 5 MG: ORAL | 30 days supply | Qty: 30 | Fill #2 | Status: AC

## 2021-07-11 ENCOUNTER — Other Ambulatory Visit (HOSPITAL_COMMUNITY): Payer: Self-pay

## 2021-08-03 ENCOUNTER — Telehealth: Payer: Self-pay | Admitting: Hematology and Oncology

## 2021-08-03 NOTE — Telephone Encounter (Signed)
Called patient regarding upcoming appointments, patient has been called and notified. 

## 2021-08-08 ENCOUNTER — Other Ambulatory Visit (HOSPITAL_COMMUNITY): Payer: Self-pay

## 2021-08-09 ENCOUNTER — Other Ambulatory Visit: Payer: Self-pay

## 2021-08-09 DIAGNOSIS — E785 Hyperlipidemia, unspecified: Secondary | ICD-10-CM

## 2021-08-09 DIAGNOSIS — I2510 Atherosclerotic heart disease of native coronary artery without angina pectoris: Secondary | ICD-10-CM

## 2021-08-10 ENCOUNTER — Other Ambulatory Visit (HOSPITAL_COMMUNITY): Payer: Self-pay

## 2021-08-16 ENCOUNTER — Inpatient Hospital Stay (HOSPITAL_BASED_OUTPATIENT_CLINIC_OR_DEPARTMENT_OTHER): Payer: Medicaid Other | Admitting: Hematology and Oncology

## 2021-08-16 ENCOUNTER — Inpatient Hospital Stay: Payer: Medicaid Other

## 2021-08-16 ENCOUNTER — Other Ambulatory Visit: Payer: Self-pay

## 2021-08-16 ENCOUNTER — Other Ambulatory Visit: Payer: Self-pay | Admitting: Hematology and Oncology

## 2021-08-16 ENCOUNTER — Inpatient Hospital Stay: Payer: Medicaid Other | Attending: Hematology and Oncology

## 2021-08-16 VITALS — BP 146/74 | HR 78 | Temp 98.9°F | Resp 18 | Wt 186.4 lb

## 2021-08-16 DIAGNOSIS — C7951 Secondary malignant neoplasm of bone: Secondary | ICD-10-CM

## 2021-08-16 DIAGNOSIS — Z5111 Encounter for antineoplastic chemotherapy: Secondary | ICD-10-CM | POA: Diagnosis not present

## 2021-08-16 DIAGNOSIS — Z79899 Other long term (current) drug therapy: Secondary | ICD-10-CM | POA: Insufficient documentation

## 2021-08-16 DIAGNOSIS — C61 Malignant neoplasm of prostate: Secondary | ICD-10-CM

## 2021-08-16 LAB — CMP (CANCER CENTER ONLY)
ALT: 19 U/L (ref 0–44)
AST: 17 U/L (ref 15–41)
Albumin: 4.4 g/dL (ref 3.5–5.0)
Alkaline Phosphatase: 81 U/L (ref 38–126)
Anion gap: 6 (ref 5–15)
BUN: 26 mg/dL — ABNORMAL HIGH (ref 8–23)
CO2: 27 mmol/L (ref 22–32)
Calcium: 9.8 mg/dL (ref 8.9–10.3)
Chloride: 106 mmol/L (ref 98–111)
Creatinine: 1.54 mg/dL — ABNORMAL HIGH (ref 0.61–1.24)
GFR, Estimated: 50 mL/min — ABNORMAL LOW (ref >60)
Glucose, Bld: 127 mg/dL — ABNORMAL HIGH (ref 70–99)
Potassium: 4.2 mmol/L (ref 3.5–5.1)
Sodium: 139 mmol/L (ref 135–145)
Total Bilirubin: 0.3 mg/dL (ref 0.3–1.2)
Total Protein: 7.9 g/dL (ref 6.5–8.1)

## 2021-08-16 LAB — CBC WITH DIFFERENTIAL (CANCER CENTER ONLY)
Abs Immature Granulocytes: 0.03 10*3/uL (ref 0.00–0.07)
Basophils Absolute: 0.1 10*3/uL (ref 0.0–0.1)
Basophils Relative: 1 %
Eosinophils Absolute: 0.2 10*3/uL (ref 0.0–0.5)
Eosinophils Relative: 2 %
HCT: 31.6 % — ABNORMAL LOW (ref 39.0–52.0)
Hemoglobin: 10.2 g/dL — ABNORMAL LOW (ref 13.0–17.0)
Immature Granulocytes: 0 %
Lymphocytes Relative: 27 %
Lymphs Abs: 2.1 10*3/uL (ref 0.7–4.0)
MCH: 27.7 pg (ref 26.0–34.0)
MCHC: 32.3 g/dL (ref 30.0–36.0)
MCV: 85.9 fL (ref 80.0–100.0)
Monocytes Absolute: 0.6 10*3/uL (ref 0.1–1.0)
Monocytes Relative: 8 %
Neutro Abs: 4.8 10*3/uL (ref 1.7–7.7)
Neutrophils Relative %: 62 %
Platelet Count: 197 10*3/uL (ref 150–400)
RBC: 3.68 MIL/uL — ABNORMAL LOW (ref 4.22–5.81)
RDW: 15 % (ref 11.5–15.5)
WBC Count: 7.9 10*3/uL (ref 4.0–10.5)
nRBC: 0 % (ref 0.0–0.2)

## 2021-08-16 MED ORDER — LEUPROLIDE ACETATE (3 MONTH) 22.5 MG ~~LOC~~ KIT
22.5000 mg | PACK | Freq: Once | SUBCUTANEOUS | Status: AC
  Administered 2021-08-16: 22.5 mg via SUBCUTANEOUS
  Filled 2021-08-16: qty 22.5

## 2021-08-16 NOTE — Progress Notes (Signed)
[place] [phone]   [phone]  PROGRESS NOTE  Patient Care Team: Rosilyn Mings. 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 Seen by pulmonary with plans for PET and biopsy, though he was lost to follow up.  3) 01/17/2020: sent to the ED by Orthopedic surgery due to metastasis of the spine noted on MRI. IN the ED CT C/A/P showed extensive left pelvic and retroperitoneal lymphadenopathy. Mediastinal and bilateral hilar lymphadenopathy as well as mixed lytic and sclerotic lesions are noted in the bony pelvis 4) 01/17/2020: PSA noted to be 1074.94 5) 01/22/2020: establish care with [name]. Started Bicalutamide 50mg  PO daily. Plan for 28 days of therapy.   6) 02/05/2020: Lupron 22.5mg  started.  7) 03/03/2020: received 45mg  lupron with urology. PSA 9.6 8) 03/30/2020: prostate biopsy performed, confirmed adenocarcinoma of the prostate.  9) 04/25/2020: stopped Zytiga therapy after < 7 days due to worsening depression 10) 04/28/2020: received enzalutamide 160mg , did not take any doses 11) 06/15/2020: agreed to restart Zytiga 1000mg  daily with prednisone 5mg   12) 09/08/2020: Lupron 22.5mg  shot.  13) 12/02/2020: Lupron 22.5mg  shot.  14) 03/01/2021: Lupron 22.5mg  shot. 15) 05/24/2021: Lupron 22.5mg  shot. 16) 08/16/2021: Lupron 22.5mg  shot.  Interval History:  [name] [name] 66 y.o. male with medical history significant for metastatic prostate cancer who presents for a follow up visit. The patient's last visit was on 05/24/2021. In the interim since the  last visit [name] [name] has continued abiraterone therapy without difficulty.   On exam today [name] [name] reports he tends to take his Zytiga regularly but does miss some doses on occasion.  He reports that in the interim since her last visit he has probably missed about 2 days worth of doses.  He notes that he is feeling much better emotionally though he does continue to struggle with fatigue.  He reports his energy is about a 5 or 6 out of 10.  He notes that he does have some occasional leg cramps but no pain in his back.  He notes that on occasion he takes about half pill of oxycodone and does occasionally take gabapentin.  He notes that he ran out of his Celexa antidepressant medication but would be willing to restart this.  He denies any fevers, chills, sweats, nausea, vomiting or diarrhea.  Full 10 point ROS is listed below.  Previously we discussed the importance of continuing Zytiga therapy and the excellent control of his disease we have with Lupron and Zytiga.  He had previously been prescribed citalopram therapy to help with his depression symptoms.   MEDICAL HISTORY:  Past Medical History:  Diagnosis Date   Brain tumor [facility])    diagnosed by MRI 6 months ago   Cancer [facility])    Pt reports brain tumor 2 years ago   Coronary artery disease 2011   s/p BMS stent LAD, [facility] [place], [place], Dr Chauncey Cruel. [name]   Depression    Dyspnea    High cholesterol    History of herniated intervertebral disc    HTN (hypertension)    Hyperlipidemia LDL goal <70 07/21/2013   Lung nodule  NSTEMI (non-ST elevated myocardial infarction) ([place]) 11/11/2017   Smoker    1/2 ppd x >40 years     SURGICAL HISTORY: Past Surgical History:  Procedure Laterality Date   CORONARY ANGIOPLASTY WITH STENT PLACEMENT Left 2011   BMS LAD, [facility] [place], [place], Dr Chauncey Cruel. [name]   CORONARY STENT INTERVENTION N/A 11/12/2017   Procedure: CORONARY STENT INTERVENTION;  Surgeon: Leonie Man,  MD;  Location: [place] [facility];  Service: Cardiovascular;  Laterality: N/A;   LEFT HEART CATH AND CORONARY ANGIOGRAPHY N/A 11/12/2017   Procedure: LEFT HEART CATH AND CORONARY ANGIOGRAPHY;  Surgeon: Leonie Man, MD;  Location: [place] [facility];  Service: Cardiovascular;  Laterality: N/A;    SOCIAL HISTORY: Social History   Socioeconomic History   Marital status: Single    Spouse name: Not on file   Number of children: Not on file   Years of education: Not on file   Highest education level: Not on file  Occupational History   Occupation: unemployed  Tobacco Use   Smoking status: Every Day    Packs/day: 0.25    Years: 45.00    Pack years: 11.25    Types: Cigarettes   Smokeless tobacco: Never  Vaping Use   Vaping Use: Never used  Substance and Sexual Activity   Alcohol use: Yes    Alcohol/week: 75.0 standard drinks    Types: 75 Shots of liquor per week    Comment: 2 shots of liquor daily   Drug use: Not Currently    Types: Marijuana    Comment: occasionally   Sexual activity: Yes  Other Topics Concern   Not on file  Social History Narrative   From [place]    Family lives in [place] (brother, sister and mother)    Unemployed    Drinks 3 (1/5)ths in 1 week   Social Determinants of Health   Financial Resource Strain: Not on file  Food Insecurity: Not on file  Transportation Needs: Not on file  Physical Activity: Not on file  Stress: Not on file  Social Connections: Not on file  Intimate Partner Violence: Not on file    FAMILY HISTORY: Family History  Problem Relation Age of Onset   Diabetes Paternal Grandfather    Hypertension Paternal Grandfather     ALLERGIES:  has No Known Allergies.  MEDICATIONS:  Current Outpatient Medications  Medication Sig Dispense Refill   abiraterone acetate (ZYTIGA) 250 MG tablet TAKE 4 TABLETS (1,000 MG TOTAL) BY MOUTH DAILY. TAKE ON AN EMPTY STOMACH 1 HOUR BEFORE OR 2 HOURS AFTER A MEAL 120 tablet 2   gabapentin  (NEURONTIN) 300 MG capsule Take 300 mg by mouth 3 (three) times daily as needed for pain.     aspirin 81 MG EC tablet Take 1 tablet (81 mg total) by mouth daily. (Patient not taking: Reported on 04/28/2021) 90 tablet 3   atorvastatin (LIPITOR) 80 MG tablet Take 1 tablet (80 mg total) by mouth daily. 90 tablet 3   calcium-vitamin D (OSCAL WITH D) 500-200 MG-UNIT tablet Take 1 tablet by mouth daily with breakfast. 90 tablet 3   carvedilol (COREG) 6.25 MG tablet Take 1 tablet (6.25 mg total) by mouth 2 (two) times daily with a meal. 180 tablet 3   citalopram (CELEXA) 20 MG tablet TAKE 1 TABLET BY MOUTH ONCE DAILY 30 tablet 3   clopidogrel (PLAVIX) 75 MG tablet Take 1 tablet (75 mg total) by mouth daily. 90 tablet 3  lisinopril (ZESTRIL) 10 MG tablet Take 1 tablet (10 mg total) by mouth daily. 90 tablet 3   nitroGLYCERIN (NITROSTAT) 0.4 MG SL tablet Place 1 tablet (0.4 mg total) under the tongue every 5 (five) minutes as needed for chest pain. 25 tablet 1   oxyCODONE 10 MG TABS Take 0.5 tablets (5 mg total) by mouth every 4 (four) hours as needed for severe pain. (Patient not taking: No sig reported) 30 tablet 0   No current facility-administered medications for this visit.    REVIEW OF SYSTEMS:   Constitutional: ( - ) fevers, ( - )  chills , ( - ) night sweats Eyes: ( - ) blurriness of vision, ( - ) double vision, ( - ) watery eyes Ears, nose, mouth, throat, and face: ( - ) mucositis, ( - ) sore throat Respiratory: ( - ) cough, ( - ) dyspnea, ( - ) wheezes Cardiovascular: ( - ) palpitation, ( - ) chest discomfort, ( - ) lower extremity swelling Gastrointestinal:  ( - ) nausea, ( - ) heartburn, ( - ) change in bowel habits Skin: ( - ) abnormal skin rashes Lymphatics: ( - ) new lymphadenopathy, ( - ) easy bruising Neurological: ( - ) numbness, ( - ) tingling, ( - ) new weaknesses Behavioral/Psych: ( - ) mood change, ( - ) new changes  All other systems were reviewed with the patient and are  negative.  PHYSICAL EXAMINATION: ECOG PERFORMANCE STATUS: 1 - Symptomatic but completely ambulatory  Vitals:   08/16/21 1200  BP: (!) 146/74  Pulse: 78  Resp: 18  Temp: 98.9 F (37.2 C)  SpO2: 100%    Filed Weights   08/16/21 1200  Weight: 186 lb 7 oz (84.6 kg)     GENERAL:  Well appearing middle aged [place] American male. alert, no distress and comfortable SKIN: skin color, texture, turgor are normal, no rashes or significant lesions EYES: conjunctiva are pink and non-injected, sclera clear LUNGS: clear to auscultation and percussion with normal breathing effort HEART: regular rate & rhythm and no murmurs and no lower extremity edema Musculoskeletal: no cyanosis of digits and no clubbing  PSYCH: alert & oriented x 3, fluent speech NEURO: no focal motor/sensory deficits  LABORATORY DATA:  I have reviewed the data as listed CBC Latest Ref Rng & Units 08/16/2021 05/24/2021 03/01/2021  WBC 4.0 - 10.5 K/uL 7.9 8.0 5.4  Hemoglobin 13.0 - 17.0 g/dL 10.2(L) 10.4(L) 11.5(L)  Hematocrit 39.0 - 52.0 % 31.6(L) 32.3(L) 35.6(L)  Platelets 150 - 400 K/uL 197 265 173    CMP Latest Ref Rng & Units 08/16/2021 05/24/2021 04/28/2021  Glucose 70 - 99 mg/dL 127(H) 99 -  BUN 8 - 23 mg/dL 26(H) 18 -  Creatinine 0.61 - 1.24 mg/dL 1.54(H) 1.59(H) -  Sodium 135 - 145 mmol/L 139 140 -  Potassium 3.5 - 5.1 mmol/L 4.2 4.5 -  Chloride 98 - 111 mmol/L 106 106 -  CO2 22 - 32 mmol/L 27 24 -  Calcium 8.9 - 10.3 mg/dL 9.8 9.8 -  Total Protein 6.5 - 8.1 g/dL 7.9 8.2(H) 7.5  Total Bilirubin 0.3 - 1.2 mg/dL 0.3 0.2(L) <0.2  Alkaline Phos 38 - 126 U/L 81 88 85  AST 15 - 41 U/L 17 22 16   ALT 0 - 44 U/L 19 18 19    RADIOGRAPHIC STUDIES: No results found.  ASSESSMENT & PLAN [name] [name] 66 y.o. male with medical history significant for metastatic prostate cancer who presents for a follow up  visit.    Previously we discussed the treatment options moving forward including d/c of abiraterone and the  addition of enzalutamide to his ADT therapy.  I discussed the side effects of both of these medications and the required follow-up.  After weighing the pros and cons the patient wanted to proceed with the enzalutamide therapy and stop taking the abiraterone therapy. However, he now reports he has never taken the enzalutamide as he is concerned about the side effects. He restarted the abiraterone and has been on this medication since Dec 2021.   The prostate biopsy has been obtained and confirms adenocarcinoma of the prostate. We can began treating with abiraterone 1000 mg p.o. daily with 5 mg of prednisone, however he d/c this due to worsening anxiety/depression. He will continue his q. 3 monthly 22.5 mg Lupron shots (most recent shot was a 45mg  on 03/03/2020, next due March 2022).  Additionally testosterone and PSA levels will be rechecked q 3 months. There is no clear indication for bisphosphonate therapy in the setting of castrate sensitive prostate cancer with metastatic disease to the bone.  After discussion today he was agreeable to continuing Zytiga 1000 mg daily with prednisone.  We will set him up for q 3 months follow ups.  # Metastatic Castrate Sensitive Prostate Cancer  --prostate biopsy performed by urology on 03/30/2020, confirms diagnosis of prostate adenocarcinoma. Gleason score not calculated due to ADT therapy  --the literature does not currently support the use of bisphosphonates in the setting of CSPC.  Plan: --CT C/A/P Scan and NM bone scan last performed in Dec 2022. Noted to have stable disease. Has stable tumor markers as well Will repeat q 6 months  --continue calcium/vitamin D for bone protection on ADT therapy.  --continue abiraterone 1000mg  PO daily with 5 mg prednisone.  --Received dose of lupron 22.5 mg today. Next shot due in May 2023  --testosterone at <3 at last check, PSA 0.1 on 08/16/2021 --have patient RTC 3 months   # Depression -- Recommend he restart citalopram 20  mg p.o. daily --We will discuss with Education officer, museum.  Can consider counseling if necessary   # Symptom management -- oxycodone 5mg  q6H PRN for pain.  --encourage OTC senna for constipation prevention if taking opioids.   # Goals of Care --discussed the incurable nature of metastatic disease --emphasized that all treatments moving forward are focused on symptom control, decreasing tumor size/slow spread, and increasing lifespan.  --patient voiced understanding of the palliative nature of treatments moving forward.  --patients MPOA is Chanel Sarina Ill (his daughter)   No orders of the defined types were placed in this encounter.  All questions were answered. The patient knows to call the clinic with any problems, questions or concerns.  A total of more than 30 minutes were spent on this encounter and over half of that time was spent on counseling and coordination of care as outlined above.   Ledell Peoples, MD Department of Hematology/Oncology [place] [facility] Phone: [phone] Pager: [phone] Email: [email or web address]  08/21/2021 6:23 PM   Literature Support:  Juanda Crumble A Systematic Review and Meta-Analysis about the Effect of Bisphosphonates on the Risk of Skeletal-Related Event in Men with Prostate Cancer. Anticancer Agents Med Chem. 2020;20(13):1604-1612.  --Our study demonstrated that bisphosphonates could not statistically significantly reduce the risk of SRE in patients with prostate cancer, neither in the subgroups with M1 or CSPC.

## 2021-08-17 ENCOUNTER — Telehealth: Payer: Self-pay | Admitting: Hematology and Oncology

## 2021-08-17 LAB — TESTOSTERONE: Testosterone: <3 ng/dL — ABNORMAL LOW (ref 264–916)

## 2021-08-17 LAB — PROSTATE-SPECIFIC AG, SERUM (LABCORP): Prostate Specific Ag, Serum: 0.1 ng/mL (ref 0.0–4.0)

## 2021-08-17 NOTE — Telephone Encounter (Signed)
Scheduled per 2/22 los, pt has been called and confirmed

## 2021-08-21 ENCOUNTER — Encounter: Payer: Self-pay | Admitting: Hematology and Oncology

## 2021-08-21 MED ORDER — CITALOPRAM HYDROBROMIDE 20 MG PO TABS: ORAL_TABLET | Freq: Every day | ORAL | 3 refills | Status: DC

## 2021-08-23 ENCOUNTER — Ambulatory Visit: Payer: Medicaid Other | Admitting: Hematology and Oncology

## 2021-08-23 ENCOUNTER — Other Ambulatory Visit: Payer: Medicaid Other

## 2021-08-29 ENCOUNTER — Other Ambulatory Visit (HOSPITAL_COMMUNITY): Payer: Self-pay

## 2021-09-21 ENCOUNTER — Other Ambulatory Visit (HOSPITAL_COMMUNITY): Payer: Self-pay

## 2021-10-05 ENCOUNTER — Other Ambulatory Visit (HOSPITAL_COMMUNITY): Payer: Self-pay

## 2021-10-17 ENCOUNTER — Other Ambulatory Visit (HOSPITAL_COMMUNITY): Payer: Self-pay

## 2021-11-15 ENCOUNTER — Other Ambulatory Visit: Payer: Self-pay | Admitting: Hematology and Oncology

## 2021-11-15 ENCOUNTER — Inpatient Hospital Stay: Payer: Medicaid Other

## 2021-11-15 ENCOUNTER — Other Ambulatory Visit: Payer: Self-pay

## 2021-11-15 ENCOUNTER — Inpatient Hospital Stay (HOSPITAL_BASED_OUTPATIENT_CLINIC_OR_DEPARTMENT_OTHER): Payer: Medicaid Other | Admitting: Hematology and Oncology

## 2021-11-15 ENCOUNTER — Inpatient Hospital Stay: Payer: Medicaid Other | Attending: Hematology and Oncology

## 2021-11-15 VITALS — BP 162/94 | HR 86 | Resp 18 | Ht 73.0 in | Wt 190.0 lb

## 2021-11-15 DIAGNOSIS — C61 Malignant neoplasm of prostate: Secondary | ICD-10-CM

## 2021-11-15 DIAGNOSIS — Z5111 Encounter for antineoplastic chemotherapy: Secondary | ICD-10-CM | POA: Insufficient documentation

## 2021-11-15 DIAGNOSIS — C7951 Secondary malignant neoplasm of bone: Secondary | ICD-10-CM

## 2021-11-15 DIAGNOSIS — Z79899 Other long term (current) drug therapy: Secondary | ICD-10-CM | POA: Diagnosis not present

## 2021-11-15 LAB — CBC WITH DIFFERENTIAL (CANCER CENTER ONLY)
Abs Immature Granulocytes: 0.03 10*3/uL (ref 0.00–0.07)
Basophils Absolute: 0.1 10*3/uL (ref 0.0–0.1)
Basophils Relative: 1 %
Eosinophils Absolute: 0.1 10*3/uL (ref 0.0–0.5)
Eosinophils Relative: 2 %
HCT: 34.6 % — ABNORMAL LOW (ref 39.0–52.0)
Hemoglobin: 11.2 g/dL — ABNORMAL LOW (ref 13.0–17.0)
Immature Granulocytes: 0 %
Lymphocytes Relative: 29 %
Lymphs Abs: 2.1 10*3/uL (ref 0.7–4.0)
MCH: 27.9 pg (ref 26.0–34.0)
MCHC: 32.4 g/dL (ref 30.0–36.0)
MCV: 86.1 fL (ref 80.0–100.0)
Monocytes Absolute: 0.5 10*3/uL (ref 0.1–1.0)
Monocytes Relative: 7 %
Neutro Abs: 4.4 10*3/uL (ref 1.7–7.7)
Neutrophils Relative %: 61 %
Platelet Count: 193 10*3/uL (ref 150–400)
RBC: 4.02 MIL/uL — ABNORMAL LOW (ref 4.22–5.81)
RDW: 14.6 % (ref 11.5–15.5)
WBC Count: 7.2 10*3/uL (ref 4.0–10.5)
nRBC: 0 % (ref 0.0–0.2)

## 2021-11-15 LAB — CMP (CANCER CENTER ONLY)
ALT: 17 U/L (ref 0–44)
AST: 18 U/L (ref 15–41)
Albumin: 4.4 g/dL (ref 3.5–5.0)
Alkaline Phosphatase: 81 U/L (ref 38–126)
Anion gap: 6 (ref 5–15)
BUN: 19 mg/dL (ref 8–23)
CO2: 27 mmol/L (ref 22–32)
Calcium: 9.8 mg/dL (ref 8.9–10.3)
Chloride: 108 mmol/L (ref 98–111)
Creatinine: 1.52 mg/dL — ABNORMAL HIGH (ref 0.61–1.24)
GFR, Estimated: 51 mL/min — ABNORMAL LOW (ref >60)
Glucose, Bld: 123 mg/dL — ABNORMAL HIGH (ref 70–99)
Potassium: 4.1 mmol/L (ref 3.5–5.1)
Sodium: 141 mmol/L (ref 135–145)
Total Bilirubin: 0.2 mg/dL — ABNORMAL LOW (ref 0.3–1.2)
Total Protein: 8.1 g/dL (ref 6.5–8.1)

## 2021-11-15 MED ORDER — LEUPROLIDE ACETATE (3 MONTH) 22.5 MG ~~LOC~~ KIT
22.5000 mg | PACK | Freq: Once | SUBCUTANEOUS | Status: AC
  Administered 2021-11-15: 22.5 mg via SUBCUTANEOUS
  Filled 2021-11-15: qty 22.5

## 2021-11-15 NOTE — Progress Notes (Signed)
[place] [phone]   [phone]  PROGRESS NOTE  Patient Care Team: [name] [name]. 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 Seen by pulmonary with plans for PET and biopsy, though he was lost to follow up.  3) 01/17/2020: sent to the ED by Orthopedic surgery due to metastasis of the spine noted on MRI. IN the ED CT C/A/P showed extensive left pelvic and retroperitoneal lymphadenopathy. Mediastinal and bilateral hilar lymphadenopathy as well as mixed lytic and sclerotic lesions are noted in the bony pelvis 4) 01/17/2020: PSA noted to be 1074.94 5) 01/22/2020: establish care with [name] [name]. Started Bicalutamide '50mg'$  PO daily. Plan for 28 days of therapy.   6) 02/05/2020: Lupron 22.'5mg'$  started.  7) 03/03/2020: received '45mg'$  lupron with urology. PSA 9.6 8) 03/30/2020: prostate biopsy performed, confirmed adenocarcinoma of the prostate.  9) 04/25/2020: stopped Zytiga therapy after < 7 days due to worsening depression 10) 04/28/2020: received enzalutamide '160mg'$ , did not take any doses 11) 06/15/2020: agreed to restart Zytiga '1000mg'$  daily with prednisone '5mg'$   12) 09/08/2020: Lupron 22.'5mg'$  shot.  13) 12/02/2020: Lupron 22.'5mg'$  shot.  14) 03/01/2021: Lupron 22.'5mg'$  shot. 15) 05/24/2021: Lupron 22.'5mg'$  shot. 16) 08/16/2021: Lupron 22.'5mg'$  shot. 17) 11/15/2021: Lupron 22.5 mg shot. Patient reports he is not taking zytiga as prescribe.   Interval History:  [name] [name] 66 y.o. male with medical history significant for metastatic prostate cancer who presents  for a follow up visit. The patient's last visit was on 08/16/2021. In the interim since the last visit [name] [name] has continued abiraterone therapy without difficulty.   On exam today [name] [name] reports he has been doing well overall in the last 3 months.  He reports that he does tend to be "super tired" and lacks motivation.  He reports he is also been having some issues with night sweats.  He reports that he is also concerned that his crotch has begun smelling due to the increased sweat.  Unfortunately he reports he has not been taking his abiraterone in the interim since her last visit.  He reports that "I do not like the way it makes me feel".  He thinks that his depressive and fatigue symptoms are tied to this medication.  He notes he is not taking it at all in the last 3 months time.  He currently is not having pain anywhere in his body.  He also denies any urinary symptoms such as straining or incomplete voiding.  He is also little bit depressed about the recent diagnosis of a meningioma which is currently being followed by a local neurosurgeon.  He denies any fevers, chills, nausea, vomiting or diarrhea.  Full 10 point ROS is listed below.  Again we discussed the importance of continuing Zytiga therapy and the excellent control of his disease we have with Lupron and Zytiga.  He had previously been prescribed citalopram therapy to help with his depression symptoms.   MEDICAL HISTORY:  Past Medical History:  Diagnosis Date   Brain tumor [facility])    diagnosed by MRI 6 months ago   Cancer ([place])    Pt reports brain tumor 2 years ago  Coronary artery disease 2011   s/p BMS stent LAD, [facility] [place], [place], Dr Chauncey Cruel. [name]   Depression    Dyspnea    High cholesterol    History of herniated intervertebral disc    HTN (hypertension)    Hyperlipidemia LDL goal <70 07/21/2013   Lung nodule    NSTEMI (non-ST elevated myocardial infarction) ([place]) 11/11/2017   Smoker    1/2 ppd x >40  years     SURGICAL HISTORY: Past Surgical History:  Procedure Laterality Date   CORONARY ANGIOPLASTY WITH STENT PLACEMENT Left 2011   BMS LAD, [facility], [facility] [place], [place], Dr Chauncey Cruel. [name]   CORONARY STENT INTERVENTION N/A 11/12/2017   Procedure: CORONARY STENT INTERVENTION;  Surgeon: Leonie Man, MD;  Location: [place] [facility];  Service: Cardiovascular;  Laterality: N/A;   LEFT HEART CATH AND CORONARY ANGIOGRAPHY N/A 11/12/2017   Procedure: LEFT HEART CATH AND CORONARY ANGIOGRAPHY;  Surgeon: Leonie Man, MD;  Location: [place] [facility];  Service: Cardiovascular;  Laterality: N/A;    SOCIAL HISTORY: Social History   Socioeconomic History   Marital status: Single    Spouse name: Not on file   Number of children: Not on file   Years of education: Not on file   Highest education level: Not on file  Occupational History   Occupation: unemployed  Tobacco Use   Smoking status: Every Day    Packs/day: 0.25    Years: 45.00    Pack years: 11.25    Types: Cigarettes   Smokeless tobacco: Never  Vaping Use   Vaping Use: Never used  Substance and Sexual Activity   Alcohol use: Yes    Alcohol/week: 75.0 standard drinks    Types: 75 Shots of liquor per week    Comment: 2 shots of liquor daily   Drug use: Not Currently    Types: Marijuana    Comment: occasionally   Sexual activity: Yes  Other Topics Concern   Not on file  Social History Narrative   From [place]    Family lives in [place] (brother, sister and mother)    Unemployed    Drinks 3 (1/5)ths in 1 week   Social Determinants of Health   Financial Resource Strain: Not on file  Food Insecurity: Not on file  Transportation Needs: Not on file  Physical Activity: Not on file  Stress: Not on file  Social Connections: Not on file  Intimate Partner Violence: Not on file    FAMILY HISTORY: Family History  Problem Relation Age of Onset   Diabetes Paternal Grandfather    Hypertension Paternal  Grandfather     ALLERGIES:  has No Known Allergies.  MEDICATIONS:  Current Outpatient Medications  Medication Sig Dispense Refill   abiraterone acetate (ZYTIGA) 250 MG tablet TAKE 4 TABLETS (1,000 MG TOTAL) BY MOUTH DAILY. TAKE ON AN EMPTY STOMACH 1 HOUR BEFORE OR 2 HOURS AFTER A MEAL 120 tablet 2   aspirin 81 MG EC tablet Take 1 tablet (81 mg total) by mouth daily. (Patient not taking: Reported on 04/28/2021) 90 tablet 3   atorvastatin (LIPITOR) 80 MG tablet Take 1 tablet (80 mg total) by mouth daily. 90 tablet 3   calcium-vitamin D (OSCAL WITH D) 500-200 MG-UNIT tablet Take 1 tablet by mouth daily with breakfast. 90 tablet 3   carvedilol (COREG) 6.25 MG tablet Take 1 tablet (6.25 mg total) by mouth 2 (two) times daily with a meal. 180 tablet 3  citalopram (CELEXA) 20 MG tablet TAKE 1 TABLET BY MOUTH ONCE DAILY 30 tablet 3   clopidogrel (PLAVIX) 75 MG tablet Take 1 tablet (75 mg total) by mouth daily. 90 tablet 3   gabapentin (NEURONTIN) 300 MG capsule Take 300 mg by mouth 3 (three) times daily as needed for pain.     lisinopril (ZESTRIL) 10 MG tablet Take 1 tablet (10 mg total) by mouth daily. 90 tablet 3   nitroGLYCERIN (NITROSTAT) 0.4 MG SL tablet Place 1 tablet (0.4 mg total) under the tongue every 5 (five) minutes as needed for chest pain. 25 tablet 1   oxyCODONE 10 MG TABS Take 0.5 tablets (5 mg total) by mouth every 4 (four) hours as needed for severe pain. (Patient not taking: No sig reported) 30 tablet 0   No current facility-administered medications for this visit.    REVIEW OF SYSTEMS:   Constitutional: ( - ) fevers, ( - )  chills , ( - ) night sweats Eyes: ( - ) blurriness of vision, ( - ) double vision, ( - ) watery eyes Ears, nose, mouth, throat, and face: ( - ) mucositis, ( - ) sore throat Respiratory: ( - ) cough, ( - ) dyspnea, ( - ) wheezes Cardiovascular: ( - ) palpitation, ( - ) chest discomfort, ( - ) lower extremity swelling Gastrointestinal:  ( - ) nausea, ( - )  heartburn, ( - ) change in bowel habits Skin: ( - ) abnormal skin rashes Lymphatics: ( - ) new lymphadenopathy, ( - ) easy bruising Neurological: ( - ) numbness, ( - ) tingling, ( - ) new weaknesses Behavioral/Psych: ( - ) mood change, ( - ) new changes  All other systems were reviewed with the patient and are negative.  PHYSICAL EXAMINATION: ECOG PERFORMANCE STATUS: 1 - Symptomatic but completely ambulatory  Vitals:   11/15/21 1046  BP: (!) 162/94  Pulse: 86  Resp: 18  SpO2: 100%    Filed Weights   11/15/21 1046  Weight: 190 lb (86.2 kg)     GENERAL:  Well appearing middle aged [place] American male. alert, no distress and comfortable SKIN: skin color, texture, turgor are normal, no rashes or significant lesions EYES: conjunctiva are pink and non-injected, sclera clear LUNGS: clear to auscultation and percussion with normal breathing effort HEART: regular rate & rhythm and no murmurs and no lower extremity edema Musculoskeletal: no cyanosis of digits and no clubbing  PSYCH: alert & oriented x 3, fluent speech NEURO: no focal motor/sensory deficits  LABORATORY DATA:  I have reviewed the data as listed    Latest Ref Rng & Units 11/15/2021   10:00 AM 08/16/2021   10:50 AM 05/24/2021    2:06 PM  CBC  WBC 4.0 - 10.5 K/uL 7.2   7.9   8.0    Hemoglobin 13.0 - 17.0 g/dL 11.2   10.2   10.4    Hematocrit 39.0 - 52.0 % 34.6   31.6   32.3    Platelets 150 - 400 K/uL 193   197   265         Latest Ref Rng & Units 11/15/2021   10:00 AM 08/16/2021   10:50 AM 05/24/2021    2:06 PM  CMP  Glucose 70 - 99 mg/dL 123   127   99    BUN 8 - 23 mg/dL '19   26   18    '$ Creatinine 0.61 - 1.24 mg/dL 1.52   1.54  1.59    Sodium 135 - 145 mmol/L 141   139   140    Potassium 3.5 - 5.1 mmol/L 4.1   4.2   4.5    Chloride 98 - 111 mmol/L 108   106   106    CO2 22 - 32 mmol/L '27   27   24    '$ Calcium 8.9 - 10.3 mg/dL 9.8   9.8   9.8    Total Protein 6.5 - 8.1 g/dL 8.1   7.9   8.2    Total  Bilirubin 0.3 - 1.2 mg/dL 0.2   0.3   0.2    Alkaline Phos 38 - 126 U/L 81   81   88    AST 15 - 41 U/L '18   17   22    '$ ALT 0 - 44 U/L '17   19   18     '$ RADIOGRAPHIC STUDIES: No results found.  ASSESSMENT & PLAN [name] [name] 66 y.o. male with medical history significant for metastatic prostate cancer who presents for a follow up visit.    Previously we discussed the treatment options moving forward including d/c of abiraterone and the addition of enzalutamide to his ADT therapy.  I discussed the side effects of both of these medications and the required follow-up.  After weighing the pros and cons the patient wanted to proceed with the enzalutamide therapy and stop taking the abiraterone therapy. However, he now reports he has never taken the enzalutamide as he is concerned about the side effects. He restarted the abiraterone and has been on this medication since Dec 2021.   The prostate biopsy has been obtained and confirms adenocarcinoma of the prostate. We can began treating with abiraterone 1000 mg p.o. daily with 5 mg of prednisone, however he d/c this due to worsening anxiety/depression. He will continue his q. 3 monthly 22.5 mg Lupron shots (most recent shot was a '45mg'$  on 03/03/2020, next due March 2022).  Additionally testosterone and PSA levels will be rechecked q 3 months. There is no clear indication for bisphosphonate therapy in the setting of castrate sensitive prostate cancer with metastatic disease to the bone.  After discussion today he was agreeable to continuing Zytiga 1000 mg daily with prednisone.  We will set him up for q 3 months follow ups.  # Metastatic Castrate Sensitive Prostate Cancer  --prostate biopsy performed by urology on 03/30/2020, confirms diagnosis of prostate adenocarcinoma. Gleason score not calculated due to ADT therapy  --the literature does not currently support the use of bisphosphonates in the setting of CSPC.  Plan: --CT C/A/P Scan and NM bone scan last  performed in Dec 2022. Noted to have stable disease. Has stable tumor markers as well Will repeat q 6 months (due in June 2023)  --continue calcium/vitamin D for bone protection on ADT therapy.  --continue abiraterone '1000mg'$  PO daily with 5 mg prednisone.  --Received dose of lupron 22.5 mg today. Next shot due in August 2023  --testosterone at <3 at last check, PSA 0.1 on 08/16/2021 --have patient RTC 3 months   # Depression -- Recommend he restart citalopram 20 mg p.o. daily   # Symptom management -- oxycodone '5mg'$  q6H PRN for pain.  --encourage OTC senna for constipation prevention if taking opioids.   # Goals of Care --discussed the incurable nature of metastatic disease --emphasized that all treatments moving forward are focused on symptom control, decreasing tumor size/slow spread, and increasing lifespan.  --patient voiced understanding  of the palliative nature of treatments moving forward.  --patients MPOA is Chanel Sarina Ill (his daughter)   Orders Placed This Encounter  Procedures   CT CHEST ABDOMEN PELVIS W CONTRAST    Standing Status:   Future    Standing Expiration Date:   11/16/2022    Order Specific Question:   Preferred imaging location?    Answer:   [facility]    Order Specific Question:   Is Oral Contrast requested for this exam?    Answer:   Yes, Per Radiology protocol   NM Bone Scan Whole Body    Standing Status:   Future    Standing Expiration Date:   11/15/2022    Order Specific Question:   If indicated for the ordered procedure, I authorize the administration of a radiopharmaceutical per Radiology protocol    Answer:   Yes    Order Specific Question:   Preferred imaging location?    Answer:   [facility]   All questions were answered. The patient knows to call the clinic with any problems, questions or concerns.  A total of more than 30 minutes were spent on this encounter and over half of that time was spent on counseling and coordination of  care as outlined above.   Ledell Peoples, MD Department of Hematology/Oncology [place] [facility] Phone: [phone] Pager: [phone] Email: [email or web address]  11/15/2021 3:41 PM   Literature Support:  Juanda Crumble A Systematic Review and Meta-Analysis about the Effect of Bisphosphonates on the Risk of Skeletal-Related Event in Men with Prostate Cancer. Anticancer Agents Med Chem. 2020;20(13):1604-1612.  --Our study demonstrated that bisphosphonates could not statistically significantly reduce the risk of SRE in patients with prostate cancer, neither in the subgroups with M1 or CSPC.

## 2021-11-16 LAB — TESTOSTERONE: Testosterone: 3 ng/dL — ABNORMAL LOW (ref 264–916)

## 2021-11-16 LAB — PROSTATE-SPECIFIC AG, SERUM (LABCORP): Prostate Specific Ag, Serum: 0.2 ng/mL (ref 0.0–4.0)

## 2021-11-17 ENCOUNTER — Telehealth: Payer: Self-pay | Admitting: *Deleted

## 2021-11-17 NOTE — Telephone Encounter (Signed)
[name] patient regarding recent lab results. Spoke with him and advised that his PSA remains low @ 0,2 and his testosterone is at target level. Reinforced with him, the need to be consistent with his abiraterone to keep his prostate cancer under good control.  Pt states he told [name] this week that he would do better with this.

## 2021-11-17 NOTE — Telephone Encounter (Signed)
-----   Message from Orson Slick, MD sent at 11/16/2021  3:06 PM EDT ----- Please let Mr. Orrego know that (remarkably) his PSA remains low at 0.2 and testosterone is at target. Strongly encourage him to continue his abiraterone. This medication is essential to keep his prostate cancer under control (he has not been taking this consistently).   ----- Message ----- From: Buel Ream, Lab In [place] Sent: 11/15/2021  10:08 AM EDT To: Orson Slick, MD

## 2021-12-06 ENCOUNTER — Other Ambulatory Visit (HOSPITAL_COMMUNITY): Payer: Self-pay

## 2021-12-06 ENCOUNTER — Other Ambulatory Visit: Payer: Self-pay | Admitting: *Deleted

## 2021-12-06 MED ORDER — ABIRATERONE ACETATE 250 MG PO TABS
ORAL_TABLET | Freq: Every day | ORAL | 2 refills | Status: DC
  Filled 2021-12-06: qty 120, fill #0
  Filled 2021-12-23 – 2022-01-02 (×2): qty 120, 30d supply, fill #0

## 2021-12-18 ENCOUNTER — Encounter (HOSPITAL_COMMUNITY): Payer: Self-pay

## 2021-12-18 ENCOUNTER — Ambulatory Visit (HOSPITAL_COMMUNITY)
Admission: RE | Admit: 2021-12-18 | Discharge: 2021-12-18 | Disposition: A | Discharge status: Home / Self Care | Payer: Medicaid Other | Source: Ambulatory Visit | Attending: Hematology and Oncology | Admitting: Hematology and Oncology

## 2021-12-18 DIAGNOSIS — C61 Malignant neoplasm of prostate: Secondary | ICD-10-CM

## 2021-12-18 MED ORDER — IOHEXOL 300 MG/ML  SOLN: 100.0000 mL | Freq: Once | INTRAMUSCULAR | Status: DC | PRN

## 2021-12-20 ENCOUNTER — Encounter (HOSPITAL_COMMUNITY)
Admission: RE | Admit: 2021-12-20 | Discharge: 2021-12-20 | Disposition: A | Discharge status: Home / Self Care | Payer: Medicaid Other | Source: Ambulatory Visit | Attending: Hematology and Oncology | Admitting: Hematology and Oncology

## 2021-12-20 ENCOUNTER — Ambulatory Visit (HOSPITAL_COMMUNITY)
Admission: RE | Admit: 2021-12-20 | Discharge: 2021-12-20 | Disposition: A | Discharge status: Home / Self Care | Payer: Medicaid Other | Source: Ambulatory Visit | Attending: Hematology and Oncology | Admitting: Hematology and Oncology

## 2021-12-20 DIAGNOSIS — C61 Malignant neoplasm of prostate: Secondary | ICD-10-CM | POA: Insufficient documentation

## 2021-12-20 MED ORDER — TECHNETIUM TC 99M MEDRONATE IV KIT
20.0000 | PACK | Freq: Once | INTRAVENOUS | Status: AC | PRN
  Administered 2021-12-20: 21.8 via INTRAVENOUS

## 2021-12-22 ENCOUNTER — Ambulatory Visit (HOSPITAL_COMMUNITY)
Admission: RE | Admit: 2021-12-22 | Discharge: 2021-12-22 | Disposition: A | Discharge status: Home / Self Care | Payer: Medicaid Other | Source: Ambulatory Visit | Attending: Hematology and Oncology | Admitting: Hematology and Oncology

## 2021-12-22 DIAGNOSIS — C61 Malignant neoplasm of prostate: Secondary | ICD-10-CM | POA: Diagnosis present

## 2021-12-22 MED ORDER — IOHEXOL 300 MG/ML  SOLN
100.0000 mL | Freq: Once | INTRAMUSCULAR | Status: AC | PRN
  Administered 2021-12-22: 100 mL via INTRAVENOUS

## 2021-12-22 MED ORDER — SODIUM CHLORIDE (PF) 0.9 % IJ SOLN
INTRAMUSCULAR | Status: AC
  Filled 2021-12-22: qty 50

## 2021-12-25 ENCOUNTER — Other Ambulatory Visit (HOSPITAL_COMMUNITY): Payer: Self-pay

## 2021-12-29 ENCOUNTER — Other Ambulatory Visit (HOSPITAL_COMMUNITY): Payer: Self-pay

## 2021-12-29 ENCOUNTER — Telehealth: Payer: Self-pay | Admitting: *Deleted

## 2021-12-29 NOTE — Telephone Encounter (Signed)
[name] patient regarding CT scan results.  No answer but was able to leave detailed message on identified vm. Advised that CT scan results were good-no new or progressive disease. Advised that he continue his Fabio Asa and that we will see him in August as scheduled. Advised to call back @ [phone] if he had any questions or concerns.

## 2021-12-29 NOTE — Telephone Encounter (Signed)
-----   Message from Orson Slick, MD sent at 12/24/2021  4:09 PM EDT ----- Please let Mr. Jiminez know that his CT scan did not show any evidence of new or progressive disease. We will see him in August as scheduled, please assure he is taking his zytiga.  ----- Message ----- From: Interface, Rad Results In Sent: 12/23/2021  12:02 PM EDT To: Orson Slick, MD

## 2022-01-02 ENCOUNTER — Other Ambulatory Visit (HOSPITAL_COMMUNITY): Payer: Self-pay

## 2022-01-18 ENCOUNTER — Other Ambulatory Visit (HOSPITAL_COMMUNITY): Payer: Self-pay

## 2022-01-18 ENCOUNTER — Other Ambulatory Visit: Payer: Self-pay | Admitting: Hematology and Oncology

## 2022-01-18 MED ORDER — PREDNISONE 5 MG PO TABS
ORAL_TABLET | Freq: Every day | ORAL | 1 refills | Status: AC
  Filled 2022-01-18: qty 30, 30d supply, fill #0

## 2022-01-29 ENCOUNTER — Other Ambulatory Visit (HOSPITAL_COMMUNITY): Payer: Self-pay

## 2022-01-31 ENCOUNTER — Other Ambulatory Visit (HOSPITAL_COMMUNITY): Payer: Self-pay

## 2022-02-06 ENCOUNTER — Other Ambulatory Visit (HOSPITAL_COMMUNITY): Payer: Self-pay

## 2022-02-07 ENCOUNTER — Other Ambulatory Visit (HOSPITAL_COMMUNITY): Payer: Self-pay

## 2022-02-14 ENCOUNTER — Other Ambulatory Visit (HOSPITAL_COMMUNITY): Payer: Self-pay

## 2022-02-16 ENCOUNTER — Inpatient Hospital Stay: Payer: Medicaid Other

## 2022-02-16 ENCOUNTER — Inpatient Hospital Stay (HOSPITAL_BASED_OUTPATIENT_CLINIC_OR_DEPARTMENT_OTHER): Payer: Medicaid Other | Admitting: Hematology and Oncology

## 2022-02-16 ENCOUNTER — Other Ambulatory Visit: Payer: Self-pay

## 2022-02-16 ENCOUNTER — Inpatient Hospital Stay: Payer: Medicaid Other | Attending: Hematology and Oncology

## 2022-02-16 ENCOUNTER — Other Ambulatory Visit: Payer: Self-pay | Admitting: Hematology and Oncology

## 2022-02-16 VITALS — BP 140/68 | HR 64 | Temp 98.0°F | Resp 16

## 2022-02-16 VITALS — BP 140/79 | HR 89 | Temp 97.9°F | Resp 18 | Ht 73.0 in | Wt 185.0 lb

## 2022-02-16 DIAGNOSIS — Z5111 Encounter for antineoplastic chemotherapy: Secondary | ICD-10-CM | POA: Diagnosis not present

## 2022-02-16 DIAGNOSIS — C61 Malignant neoplasm of prostate: Secondary | ICD-10-CM

## 2022-02-16 DIAGNOSIS — Z79899 Other long term (current) drug therapy: Secondary | ICD-10-CM | POA: Insufficient documentation

## 2022-02-16 DIAGNOSIS — C7951 Secondary malignant neoplasm of bone: Secondary | ICD-10-CM | POA: Diagnosis not present

## 2022-02-16 LAB — CMP (CANCER CENTER ONLY)
ALT: 22 U/L (ref 0–44)
AST: 24 U/L (ref 15–41)
Albumin: 4.6 g/dL (ref 3.5–5.0)
Alkaline Phosphatase: 81 U/L (ref 38–126)
Anion gap: 7 (ref 5–15)
BUN: 23 mg/dL (ref 8–23)
CO2: 25 mmol/L (ref 22–32)
Calcium: 10.3 mg/dL (ref 8.9–10.3)
Chloride: 107 mmol/L (ref 98–111)
Creatinine: 1.52 mg/dL — ABNORMAL HIGH (ref 0.61–1.24)
GFR, Estimated: 51 mL/min — ABNORMAL LOW (ref >60)
Glucose, Bld: 116 mg/dL — ABNORMAL HIGH (ref 70–99)
Potassium: 4.4 mmol/L (ref 3.5–5.1)
Sodium: 139 mmol/L (ref 135–145)
Total Bilirubin: 0.3 mg/dL (ref 0.3–1.2)
Total Protein: 7.9 g/dL (ref 6.5–8.1)

## 2022-02-16 LAB — CBC WITH DIFFERENTIAL (CANCER CENTER ONLY)
Abs Immature Granulocytes: 0.02 10*3/uL (ref 0.00–0.07)
Basophils Absolute: 0.1 10*3/uL (ref 0.0–0.1)
Basophils Relative: 1 %
Eosinophils Absolute: 0.2 10*3/uL (ref 0.0–0.5)
Eosinophils Relative: 3 %
HCT: 34.3 % — ABNORMAL LOW (ref 39.0–52.0)
Hemoglobin: 11.2 g/dL — ABNORMAL LOW (ref 13.0–17.0)
Immature Granulocytes: 0 %
Lymphocytes Relative: 34 %
Lymphs Abs: 2.6 10*3/uL (ref 0.7–4.0)
MCH: 27.7 pg (ref 26.0–34.0)
MCHC: 32.7 g/dL (ref 30.0–36.0)
MCV: 84.7 fL (ref 80.0–100.0)
Monocytes Absolute: 0.6 10*3/uL (ref 0.1–1.0)
Monocytes Relative: 8 %
Neutro Abs: 4.2 10*3/uL (ref 1.7–7.7)
Neutrophils Relative %: 54 %
Platelet Count: 211 10*3/uL (ref 150–400)
RBC: 4.05 MIL/uL — ABNORMAL LOW (ref 4.22–5.81)
RDW: 14.6 % (ref 11.5–15.5)
WBC Count: 7.7 10*3/uL (ref 4.0–10.5)
nRBC: 0 % (ref 0.0–0.2)

## 2022-02-16 MED ORDER — LEUPROLIDE ACETATE (3 MONTH) 22.5 MG ~~LOC~~ KIT
22.5000 mg | PACK | Freq: Once | SUBCUTANEOUS | Status: AC
  Administered 2022-02-16: 22.5 mg via SUBCUTANEOUS
  Filled 2022-02-16: qty 22.5

## 2022-02-16 NOTE — Progress Notes (Signed)
[place] [phone]   [phone]  PROGRESS NOTE  Patient Care Team: Rosilyn Mings. 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 Seen by pulmonary with plans for PET and biopsy, though he was lost to follow up.  3) 01/17/2020: sent to the ED by Orthopedic surgery due to metastasis of the spine noted on MRI. IN the ED CT C/A/P showed extensive left pelvic and retroperitoneal lymphadenopathy. Mediastinal and bilateral hilar lymphadenopathy as well as mixed lytic and sclerotic lesions are noted in the bony pelvis 4) 01/17/2020: PSA noted to be 1074.94 5) 01/22/2020: establish care with [name]. Started Bicalutamide '50mg'$  PO daily. Plan for 28 days of therapy.   6) 02/05/2020: Lupron 22.'5mg'$  started.  7) 03/03/2020: received '45mg'$  lupron with urology. PSA 9.6 8) 03/30/2020: prostate biopsy performed, confirmed adenocarcinoma of the prostate.  9) 04/25/2020: stopped Zytiga therapy after < 7 days due to worsening depression 10) 04/28/2020: received enzalutamide '160mg'$ , did not take any doses 11) 06/15/2020: agreed to restart Zytiga '1000mg'$  daily with prednisone '5mg'$   12) 09/08/2020: Lupron 22.'5mg'$  shot.  13) 12/02/2020: Lupron 22.'5mg'$  shot.  14) 03/01/2021: Lupron 22.'5mg'$  shot. 15) 05/24/2021: Lupron 22.'5mg'$  shot. 16) 08/16/2021: Lupron 22.'5mg'$  shot. 17) 11/15/2021: Lupron 22.5 mg shot. Patient reports he is not taking zytiga as prescribed.   Interval History:  [name] [name] 66 y.o. male with medical history significant for metastatic prostate cancer who presents  for a follow up visit. The patient's last visit was on 11/15/2021. In the interim since the last visit [name] [name] has been noncompliant with his abiraterone therapy.  On exam today [name] [name] reports he is not currently having any pain or any major issues.  He reports that he is simply not taking the abiraterone therapy.  He is "scared of the way it makes me feel".  He reports that it makes him more depressed and tired.  He notes that he is more willing to consider enzalutamide therapy.  He does have a supply of this which he is willing to start.  He does continue to struggle with depression but does not want to take citalopram therapy.  He reports his energy level is about the same and that he just has low motivation.  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  He denies any desire to hurt himself or others.  He denies any fevers, chills, nausea, vomiting or diarrhea.  Full 10 point ROS is listed below.  Again we discussed the importance of continuing Zytiga therapy and the excellent control of his disease we have with Lupron and Zytiga.  He had previously been prescribed citalopram therapy to help with his depression symptoms.  He noted he would be more agreeable to restarting enzalutamide therapy 160 mg p.o. daily.  MEDICAL HISTORY:  Past Medical History:  Diagnosis Date   Brain tumor [facility])    diagnosed by MRI 6 months ago   Cancer [facility])    Pt reports brain tumor 2 years ago   Coronary artery disease 2011   s/p BMS stent LAD, [facility] [place] [place], Dr Chauncey Cruel. [name]   Depression  Dyspnea    High cholesterol    History of herniated intervertebral disc    HTN (hypertension)    Hyperlipidemia LDL goal <70 07/21/2013   Lung nodule    NSTEMI (non-ST elevated myocardial infarction) ([place]) 11/11/2017   Smoker    1/2 ppd x >40 years     SURGICAL HISTORY: Past Surgical History:  Procedure Laterality Date   CORONARY ANGIOPLASTY WITH STENT PLACEMENT Left 2011    BMS LAD, [facility], [facility] [place], [place], Dr Chauncey Cruel. [name]   CORONARY STENT INTERVENTION N/A 11/12/2017   Procedure: CORONARY STENT INTERVENTION;  Surgeon: Leonie Man, MD;  Location: [place] [facility];  Service: Cardiovascular;  Laterality: N/A;   LEFT HEART CATH AND CORONARY ANGIOGRAPHY N/A 11/12/2017   Procedure: LEFT HEART CATH AND CORONARY ANGIOGRAPHY;  Surgeon: Leonie Man, MD;  Location: [place] [facility];  Service: Cardiovascular;  Laterality: N/A;    SOCIAL HISTORY: Social History   Socioeconomic History   Marital status: Single    Spouse name: Not on file   Number of children: Not on file   Years of education: Not on file   Highest education level: Not on file  Occupational History   Occupation: unemployed  Tobacco Use   Smoking status: Every Day    Packs/day: 0.25    Years: 45.00    Total pack years: 11.25    Types: Cigarettes   Smokeless tobacco: Never  Vaping Use   Vaping Use: Never used  Substance and Sexual Activity   Alcohol use: Yes    Alcohol/week: 75.0 standard drinks of alcohol    Types: 75 Shots of liquor per week    Comment: 2 shots of liquor daily   Drug use: Not Currently    Types: Marijuana    Comment: occasionally   Sexual activity: Yes  Other Topics Concern   Not on file  Social History Narrative   From [place]    Family lives in [place] (brother, sister and mother)    Unemployed    Drinks 3 (1/5)ths in 1 week   Social Determinants of Health   Financial Resource Strain: Low Risk  (01/22/2020)   Overall Financial Resource Strain (CARDIA)    Difficulty of Paying Living Expenses: Not very hard  Food Insecurity: No Food Insecurity (01/22/2020)   Hunger Vital Sign    Worried About Running Out of Food in the Last Year: Never true    Ran Out of Food in the Last Year: Never true  Transportation Needs: Unmet Transportation Needs (12/14/2021)   PRAPARE - Hydrologist (Medical): Not on file    Lack  of Transportation (Non-Medical): Yes  Physical Activity: Inactive (01/22/2020)   Exercise Vital Sign    Days of Exercise per Week: 0 days    Minutes of Exercise per Session: 0 min  Stress: Stress Concern Present (01/22/2020)   [place]    Feeling of Stress : To some extent  Social Connections: Socially Isolated (01/22/2020)   Social Connection and Isolation Panel [NHANES]    Frequency of Communication with Friends and Family: Three times a week    Frequency of Social Gatherings with Friends and Family: Three times a week    Attends Religious Services: Never    Active Member of Clubs or Organizations: No    Attends Archivist Meetings: Never    Marital Status: Never married  Intimate Partner Violence: Not  on file    FAMILY HISTORY: Family History  Problem Relation Age of Onset   Diabetes Paternal Grandfather    Hypertension Paternal Grandfather     ALLERGIES:  has No Known Allergies.  MEDICATIONS:  Current Outpatient Medications  Medication Sig Dispense Refill   abiraterone acetate (ZYTIGA) 250 MG tablet TAKE 4 TABLETS (1,000 MG TOTAL) BY MOUTH DAILY. TAKE ON AN EMPTY STOMACH 1 HOUR BEFORE OR 2 HOURS AFTER A MEAL 120 tablet 2   aspirin 81 MG EC tablet Take 1 tablet (81 mg total) by mouth daily. 90 tablet 3   atorvastatin (LIPITOR) 80 MG tablet Take 1 tablet (80 mg total) by mouth daily. 90 tablet 3   calcium-vitamin D (OSCAL WITH D) 500-200 MG-UNIT tablet Take 1 tablet by mouth daily with breakfast. 90 tablet 3   carvedilol (COREG) 6.25 MG tablet Take 1 tablet (6.25 mg total) by mouth 2 (two) times daily with a meal. 180 tablet 3   clopidogrel (PLAVIX) 75 MG tablet Take 1 tablet (75 mg total) by mouth daily. 90 tablet 3   enzalutamide (XTANDI) 40 MG capsule Take 4 capsules (160 mg total) by mouth daily. 120 capsule 3   lisinopril (ZESTRIL) 10 MG tablet Take 1 tablet (10 mg total) by mouth daily. 90 tablet  3   nitroGLYCERIN (NITROSTAT) 0.4 MG SL tablet Place 1 tablet (0.4 mg total) under the tongue every 5 (five) minutes as needed for chest pain. 25 tablet 1   predniSONE (DELTASONE) 5 MG tablet TAKE 1 TABLET (5 MG TOTAL) BY MOUTH DAILY WITH BREAKFAST. 90 tablet 1   No current facility-administered medications for this visit.    REVIEW OF SYSTEMS:   Constitutional: ( - ) fevers, ( - )  chills , ( - ) night sweats Eyes: ( - ) blurriness of vision, ( - ) double vision, ( - ) watery eyes Ears, nose, mouth, throat, and face: ( - ) mucositis, ( - ) sore throat Respiratory: ( - ) cough, ( - ) dyspnea, ( - ) wheezes Cardiovascular: ( - ) palpitation, ( - ) chest discomfort, ( - ) lower extremity swelling Gastrointestinal:  ( - ) nausea, ( - ) heartburn, ( - ) change in bowel habits Skin: ( - ) abnormal skin rashes Lymphatics: ( - ) new lymphadenopathy, ( - ) easy bruising Neurological: ( - ) numbness, ( - ) tingling, ( - ) new weaknesses Behavioral/Psych: ( - ) mood change, ( - ) new changes  All other systems were reviewed with the patient and are negative.  PHYSICAL EXAMINATION: ECOG PERFORMANCE STATUS: 1 - Symptomatic but completely ambulatory  Vitals:   02/16/22 0956  BP: (!) 140/79  Pulse: 89  Resp: 18  Temp: 97.9 F (36.6 C)  SpO2: 100%    Filed Weights   02/16/22 0956  Weight: 185 lb (83.9 kg)     GENERAL:  Well appearing middle aged [place] American male. alert, no distress and comfortable SKIN: skin color, texture, turgor are normal, no rashes or significant lesions EYES: conjunctiva are pink and non-injected, sclera clear LUNGS: clear to auscultation and percussion with normal breathing effort HEART: regular rate & rhythm and no murmurs and no lower extremity edema Musculoskeletal: no cyanosis of digits and no clubbing  PSYCH: alert & oriented x 3, fluent speech NEURO: no focal motor/sensory deficits  LABORATORY DATA:  I have reviewed the data as listed    Latest  Ref Rng & Units 02/16/2022    9:45 AM  11/15/2021   10:00 AM 08/16/2021   10:50 AM  CBC  WBC 4.0 - 10.5 K/uL 7.7  7.2  7.9   Hemoglobin 13.0 - 17.0 g/dL 11.2  11.2  10.2   Hematocrit 39.0 - 52.0 % 34.3  34.6  31.6   Platelets 150 - 400 K/uL 211  193  197        Latest Ref Rng & Units 02/16/2022    9:45 AM 11/15/2021   10:00 AM 08/16/2021   10:50 AM  CMP  Glucose 70 - 99 mg/dL 116  123  127   BUN 8 - 23 mg/dL '23  19  26   '$ Creatinine 0.61 - 1.24 mg/dL 1.52  1.52  1.54   Sodium 135 - 145 mmol/L 139  141  139   Potassium 3.5 - 5.1 mmol/L 4.4  4.1  4.2   Chloride 98 - 111 mmol/L 107  108  106   CO2 22 - 32 mmol/L '25  27  27   '$ Calcium 8.9 - 10.3 mg/dL 10.3  9.8  9.8   Total Protein 6.5 - 8.1 g/dL 7.9  8.1  7.9   Total Bilirubin 0.3 - 1.2 mg/dL 0.3  0.2  0.3   Alkaline Phos 38 - 126 U/L 81  81  81   AST 15 - 41 U/L '24  18  17   '$ ALT 0 - 44 U/L '22  17  19    '$ RADIOGRAPHIC STUDIES: No results found.  ASSESSMENT & PLAN Aveer Bartow 66 y.o. male with medical history significant for metastatic prostate cancer who presents for a follow up visit.    Previously we discussed the treatment options moving forward including d/c of abiraterone and the addition of enzalutamide to his ADT therapy.  I discussed the side effects of both of these medications and the required follow-up.  After weighing the pros and cons the patient wanted to proceed with the enzalutamide therapy and stop taking the abiraterone therapy. However, he now reports he has never taken the enzalutamide as he is concerned about the side effects. He restarted the abiraterone and has been on this medication since Dec 2021.   The prostate biopsy has been obtained and confirms adenocarcinoma of the prostate. We can began treating with abiraterone 1000 mg p.o. daily with 5 mg of prednisone, however he d/c this due to worsening anxiety/depression. He will continue his q. 3 monthly 22.5 mg Lupron shots (most recent shot was a '45mg'$  on  03/03/2020, next due March 2022).  Additionally testosterone and PSA levels will be rechecked q 3 months. There is no clear indication for bisphosphonate therapy in the setting of castrate sensitive prostate cancer with metastatic disease to the bone.  After discussion today he was agreeable to continuing Zytiga 1000 mg daily with prednisone.  We will set him up for q 3 months follow ups.  # Metastatic Castrate Sensitive Prostate Cancer  --prostate biopsy performed by urology on 03/30/2020, confirms diagnosis of prostate adenocarcinoma. Gleason score not calculated due to ADT therapy  --the literature does not currently support the use of bisphosphonates in the setting of CSPC.  Plan: --CT C/A/P Scan and NM bone scan last performed in June 2023. Noted to have stable disease. Has stable tumor markers as well Will repeat q 6 months (due in Dec 2023)  --continue calcium/vitamin D for bone protection on ADT therapy.  --d/c abiraterone per patient request and start enzalutamide 160 mg p.o. daily instead. --Received dose of lupron 22.5  mg today. Next shot due in Nov 2023  --testosterone at <3 at last check, PSA 0.4 on 02/16/2022 --have patient RTC 3 months   # Depression -- Recommend he restart citalopram 20 mg p.o. daily   # Symptom management -- oxycodone '5mg'$  q6H PRN for pain.  --encourage OTC senna for constipation prevention if taking opioids.   # Goals of Care --discussed the incurable nature of metastatic disease --emphasized that all treatments moving forward are focused on symptom control, decreasing tumor size/slow spread, and increasing lifespan.  --patient voiced understanding of the palliative nature of treatments moving forward.  --patients MPOA is Chanel Sarina Ill (his daughter)   No orders of the defined types were placed in this encounter.  All questions were answered. The patient knows to call the clinic with any problems, questions or concerns.  A total of more than 30 minutes were  spent on this encounter and over half of that time was spent on counseling and coordination of care as outlined above.   Ledell Peoples, MD Department of Hematology/Oncology [place] [facility] Phone: [phone] Pager: [phone] Email: [email or web address]  02/25/2022 7:47 PM   Literature Support:  Juanda Crumble A Systematic Review and Meta-Analysis about the Effect of Bisphosphonates on the Risk of Skeletal-Related Event in Men with Prostate Cancer. Anticancer Agents Med Chem. 2020;20(13):1604-1612.  --Our study demonstrated that bisphosphonates could not statistically significantly reduce the risk of SRE in patients with prostate cancer, neither in the subgroups with M1 or CSPC.

## 2022-02-17 LAB — TESTOSTERONE: Testosterone: <3 ng/dL — ABNORMAL LOW (ref 264–916)

## 2022-02-17 LAB — PROSTATE-SPECIFIC AG, SERUM (LABCORP): Prostate Specific Ag, Serum: 0.4 ng/mL (ref 0.0–4.0)

## 2022-02-25 ENCOUNTER — Encounter: Payer: Self-pay | Admitting: Hematology and Oncology

## 2022-02-25 MED ORDER — ENZALUTAMIDE 40 MG PO CAPS
160.0000 mg | ORAL_CAPSULE | Freq: Every day | ORAL | 3 refills | Status: DC
  Filled 2022-02-25: qty 120, 30d supply, fill #0

## 2022-02-27 ENCOUNTER — Other Ambulatory Visit (HOSPITAL_COMMUNITY): Payer: Self-pay

## 2022-04-11 ENCOUNTER — Other Ambulatory Visit (HOSPITAL_COMMUNITY): Payer: Self-pay

## 2022-04-18 ENCOUNTER — Other Ambulatory Visit (HOSPITAL_COMMUNITY): Payer: Self-pay

## 2022-04-23 ENCOUNTER — Other Ambulatory Visit (HOSPITAL_COMMUNITY): Payer: Self-pay

## 2022-05-20 ENCOUNTER — Other Ambulatory Visit: Payer: Self-pay | Admitting: Hematology and Oncology

## 2022-05-20 DIAGNOSIS — C61 Malignant neoplasm of prostate: Secondary | ICD-10-CM

## 2022-05-21 ENCOUNTER — Inpatient Hospital Stay: Payer: Medicaid Other

## 2022-05-21 ENCOUNTER — Inpatient Hospital Stay: Payer: Medicaid Other | Attending: Hematology and Oncology

## 2022-05-21 ENCOUNTER — Inpatient Hospital Stay (HOSPITAL_BASED_OUTPATIENT_CLINIC_OR_DEPARTMENT_OTHER): Payer: Medicaid Other | Admitting: Hematology and Oncology

## 2022-05-21 ENCOUNTER — Other Ambulatory Visit: Payer: Self-pay

## 2022-05-21 VITALS — BP 175/92 | HR 82 | Temp 97.0°F | Resp 15 | Wt 186.4 lb

## 2022-05-21 DIAGNOSIS — Z5111 Encounter for antineoplastic chemotherapy: Secondary | ICD-10-CM | POA: Diagnosis present

## 2022-05-21 DIAGNOSIS — C7951 Secondary malignant neoplasm of bone: Secondary | ICD-10-CM | POA: Insufficient documentation

## 2022-05-21 DIAGNOSIS — C61 Malignant neoplasm of prostate: Secondary | ICD-10-CM

## 2022-05-21 DIAGNOSIS — Z79899 Other long term (current) drug therapy: Secondary | ICD-10-CM | POA: Diagnosis not present

## 2022-05-21 LAB — CBC WITH DIFFERENTIAL (CANCER CENTER ONLY)
Abs Immature Granulocytes: 0.02 10*3/uL (ref 0.00–0.07)
Basophils Absolute: 0.1 10*3/uL (ref 0.0–0.1)
Basophils Relative: 1 %
Eosinophils Absolute: 0.1 10*3/uL (ref 0.0–0.5)
Eosinophils Relative: 2 %
HCT: 36.4 % — ABNORMAL LOW (ref 39.0–52.0)
Hemoglobin: 11.6 g/dL — ABNORMAL LOW (ref 13.0–17.0)
Immature Granulocytes: 0 %
Lymphocytes Relative: 34 %
Lymphs Abs: 2.6 10*3/uL (ref 0.7–4.0)
MCH: 27.5 pg (ref 26.0–34.0)
MCHC: 31.9 g/dL (ref 30.0–36.0)
MCV: 86.3 fL (ref 80.0–100.0)
Monocytes Absolute: 0.5 10*3/uL (ref 0.1–1.0)
Monocytes Relative: 7 %
Neutro Abs: 4.4 10*3/uL (ref 1.7–7.7)
Neutrophils Relative %: 56 %
Platelet Count: 183 10*3/uL (ref 150–400)
RBC: 4.22 MIL/uL (ref 4.22–5.81)
RDW: 14.9 % (ref 11.5–15.5)
WBC Count: 7.7 10*3/uL (ref 4.0–10.5)
nRBC: 0 % (ref 0.0–0.2)

## 2022-05-21 LAB — CMP (CANCER CENTER ONLY)
ALT: 15 U/L (ref 0–44)
AST: 17 U/L (ref 15–41)
Albumin: 4.6 g/dL (ref 3.5–5.0)
Alkaline Phosphatase: 76 U/L (ref 38–126)
Anion gap: 6 (ref 5–15)
BUN: 26 mg/dL — ABNORMAL HIGH (ref 8–23)
CO2: 27 mmol/L (ref 22–32)
Calcium: 10.1 mg/dL (ref 8.9–10.3)
Chloride: 107 mmol/L (ref 98–111)
Creatinine: 1.65 mg/dL — ABNORMAL HIGH (ref 0.61–1.24)
GFR, Estimated: 46 mL/min — ABNORMAL LOW (ref >60)
Glucose, Bld: 116 mg/dL — ABNORMAL HIGH (ref 70–99)
Potassium: 4.2 mmol/L (ref 3.5–5.1)
Sodium: 140 mmol/L (ref 135–145)
Total Bilirubin: 0.3 mg/dL (ref 0.3–1.2)
Total Protein: 8.1 g/dL (ref 6.5–8.1)

## 2022-05-21 MED ORDER — LEUPROLIDE ACETATE (3 MONTH) 22.5 MG ~~LOC~~ KIT
22.5000 mg | PACK | Freq: Once | SUBCUTANEOUS | Status: AC
  Administered 2022-05-21: 22.5 mg via SUBCUTANEOUS
  Filled 2022-05-21: qty 22.5

## 2022-05-21 NOTE — Progress Notes (Signed)
[name] [name] [phone]   [phone]  PROGRESS NOTE  Patient Care Team: Rosilyn Mings. 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 Seen by pulmonary with plans for PET and biopsy, though he was lost to follow up.  3) 01/17/2020: sent to the ED by Orthopedic surgery due to metastasis of the spine noted on MRI. IN the ED CT C/A/P showed extensive left pelvic and retroperitoneal lymphadenopathy. Mediastinal and bilateral hilar lymphadenopathy as well as mixed lytic and sclerotic lesions are noted in the bony pelvis 4) 01/17/2020: PSA noted to be 1074.94 5) 01/22/2020: establish care with [name]. Started Bicalutamide '50mg'$  PO daily. Plan for 28 days of therapy.   6) 02/05/2020: Lupron 22.'5mg'$  started.  7) 03/03/2020: received '45mg'$  lupron with urology. PSA 9.6 8) 03/30/2020: prostate biopsy performed, confirmed adenocarcinoma of the prostate.  9) 04/25/2020: stopped Zytiga therapy after < 7 days due to worsening depression 10) 04/28/2020: received enzalutamide '160mg'$ , did not take any doses 11) 06/15/2020: agreed to restart Zytiga '1000mg'$  daily with prednisone '5mg'$   12) 09/08/2020: Lupron 22.'5mg'$  shot.  13) 12/02/2020: Lupron 22.'5mg'$  shot.  14) 03/01/2021: Lupron 22.'5mg'$  shot. 15) 05/24/2021: Lupron 22.'5mg'$  shot. 16) 08/16/2021: Lupron 22.'5mg'$  shot. 17) 11/15/2021: Lupron 22.5 mg shot. Patient reports he is not taking zytiga as prescribed.   Interval History:  [name] [name] 66 y.o. male with medical history significant for metastatic prostate cancer who presents  for a follow up visit. The patient's last visit was on 02/16/2022. In the interim since the last visit Mr. Gebhard has been noncompliant with his abiraterone therapy.  On exam today Mr. Cerullo reports his energy has been quite poor and currently measures as a 5 out of 10.  He reports that he is not taking any of the medications he is currently prescribed.  This includes his prostate cancer medication.  He notes that he did have a good Thanksgiving and he ate quite well.  He notes he does feel down.  He denies any fevers, chills, nausea, vomiting or diarrhea.  Full 10 point ROS is listed below.  Again we discussed the importance of continuing Zytiga therapy and the excellent control of his disease we have with Lupron and Zytiga.  He had previously been prescribed citalopram therapy to help with his depression symptoms.  He is no longer interested in restarting enzalutamide therapy 160 mg p.o. daily.  MEDICAL HISTORY:  Past Medical History:  Diagnosis Date   Brain tumor [facility])    diagnosed by MRI 6 months ago   Cancer [facility])    Pt reports brain tumor 2 years ago   Coronary artery disease 2011   s/p BMS stent LAD, [facility], [facility] [place], [place], Dr Chauncey Cruel. [name]   Depression    Dyspnea    High cholesterol    History of herniated intervertebral disc    HTN (hypertension)    Hyperlipidemia LDL goal <70 07/21/2013   Lung nodule    NSTEMI (non-ST elevated myocardial infarction) ([place]) 11/11/2017   Smoker    1/2 ppd x >40 years     SURGICAL HISTORY: Past Surgical History:  Procedure Laterality Date  CORONARY ANGIOPLASTY WITH STENT PLACEMENT Left 2011   BMS LAD, [facility], [facility] [place], [place], Dr Chauncey Cruel. [name]   CORONARY STENT INTERVENTION N/A 11/12/2017   Procedure: CORONARY STENT INTERVENTION;  Surgeon: Leonie Man, MD;  Location: [place] [facility];  Service: Cardiovascular;  Laterality: N/A;   LEFT HEART CATH AND CORONARY ANGIOGRAPHY N/A 11/12/2017   Procedure: LEFT HEART  CATH AND CORONARY ANGIOGRAPHY;  Surgeon: Leonie Man, MD;  Location: [place] [facility];  Service: Cardiovascular;  Laterality: N/A;    SOCIAL HISTORY: Social History   Socioeconomic History   Marital status: Single    Spouse name: Not on file   Number of children: Not on file   Years of education: Not on file   Highest education level: Not on file  Occupational History   Occupation: unemployed  Tobacco Use   Smoking status: Every Day    Packs/day: 0.25    Years: 45.00    Total pack years: 11.25    Types: Cigarettes   Smokeless tobacco: Never  Vaping Use   Vaping Use: Never used  Substance and Sexual Activity   Alcohol use: Yes    Alcohol/week: 75.0 standard drinks of alcohol    Types: 75 Shots of liquor per week    Comment: 2 shots of liquor daily   Drug use: Not Currently    Types: Marijuana    Comment: occasionally   Sexual activity: Yes  Other Topics Concern   Not on file  Social History Narrative   From [place]    Family lives in [place] (brother, sister and mother)    Unemployed    Drinks 3 (1/5)ths in 1 week   Social Determinants of Health   Financial Resource Strain: Low Risk  (01/22/2020)   Overall Financial Resource Strain (CARDIA)    Difficulty of Paying Living Expenses: Not very hard  Food Insecurity: No Food Insecurity (01/22/2020)   Hunger Vital Sign    Worried About Running Out of Food in the Last Year: Never true    Ran Out of Food in the Last Year: Never true  Transportation Needs: Unmet Transportation Needs (12/14/2021)   PRAPARE - Hydrologist (Medical): Not on file    Lack of Transportation (Non-Medical): Yes  Physical Activity: Inactive (01/22/2020)   Exercise Vital Sign    Days of Exercise per Week: 0 days    Minutes of Exercise per Session: 0 min  Stress: Stress Concern Present (01/22/2020)   [place]    Feeling of Stress : To some  extent  Social Connections: Socially Isolated (01/22/2020)   Social Connection and Isolation Panel [NHANES]    Frequency of Communication with Friends and Family: Three times a week    Frequency of Social Gatherings with Friends and Family: Three times a week    Attends Religious Services: Never    Active Member of Clubs or Organizations: No    Attends Archivist Meetings: Never    Marital Status: Never married  Human resources officer Violence: Not on file    FAMILY HISTORY: Family History  Problem Relation Age of Onset   Diabetes Paternal Grandfather    Hypertension Paternal Grandfather     ALLERGIES:  has No Known Allergies.  MEDICATIONS:  Current Outpatient Medications  Medication Sig Dispense Refill   abiraterone acetate (ZYTIGA) 250 MG tablet TAKE 4 TABLETS (1,000 MG TOTAL) BY MOUTH DAILY. TAKE ON AN  EMPTY STOMACH 1 HOUR BEFORE OR 2 HOURS AFTER A MEAL (Patient not taking: Reported on 05/21/2022) 120 tablet 2   aspirin 81 MG EC tablet Take 1 tablet (81 mg total) by mouth daily. (Patient not taking: Reported on 05/21/2022) 90 tablet 3   atorvastatin (LIPITOR) 80 MG tablet Take 1 tablet (80 mg total) by mouth daily. 90 tablet 3   calcium-vitamin D (OSCAL WITH D) 500-200 MG-UNIT tablet Take 1 tablet by mouth daily with breakfast. (Patient not taking: Reported on 05/21/2022) 90 tablet 3   carvedilol (COREG) 6.25 MG tablet Take 1 tablet (6.25 mg total) by mouth 2 (two) times daily with a meal. (Patient not taking: Reported on 05/21/2022) 180 tablet 3   clopidogrel (PLAVIX) 75 MG tablet Take 1 tablet (75 mg total) by mouth daily. (Patient not taking: Reported on 05/21/2022) 90 tablet 3   enzalutamide (XTANDI) 40 MG capsule Take 4 capsules (160 mg total) by mouth daily. (Patient not taking: Reported on 05/21/2022) 120 capsule 3   lisinopril (ZESTRIL) 10 MG tablet Take 1 tablet (10 mg total) by mouth daily. (Patient not taking: Reported on 05/21/2022) 90 tablet 3   nitroGLYCERIN  (NITROSTAT) 0.4 MG SL tablet Place 1 tablet (0.4 mg total) under the tongue every 5 (five) minutes as needed for chest pain. 25 tablet 1   predniSONE (DELTASONE) 5 MG tablet TAKE 1 TABLET (5 MG TOTAL) BY MOUTH DAILY WITH BREAKFAST. (Patient not taking: Reported on 05/21/2022) 90 tablet 1   No current facility-administered medications for this visit.    REVIEW OF SYSTEMS:   Constitutional: ( - ) fevers, ( - )  chills , ( - ) night sweats Eyes: ( - ) blurriness of vision, ( - ) double vision, ( - ) watery eyes Ears, nose, mouth, throat, and face: ( - ) mucositis, ( - ) sore throat Respiratory: ( - ) cough, ( - ) dyspnea, ( - ) wheezes Cardiovascular: ( - ) palpitation, ( - ) chest discomfort, ( - ) lower extremity swelling Gastrointestinal:  ( - ) nausea, ( - ) heartburn, ( - ) change in bowel habits Skin: ( - ) abnormal skin rashes Lymphatics: ( - ) new lymphadenopathy, ( - ) easy bruising Neurological: ( - ) numbness, ( - ) tingling, ( - ) new weaknesses Behavioral/Psych: ( - ) mood change, ( - ) new changes  All other systems were reviewed with the patient and are negative.  PHYSICAL EXAMINATION: ECOG PERFORMANCE STATUS: 1 - Symptomatic but completely ambulatory  Vitals:   05/21/22 0940  BP: (!) 175/92  Pulse: 82  Resp: 15  Temp: (!) 97 F (36.1 C)  SpO2: 99%    Filed Weights   05/21/22 0940  Weight: 186 lb 6.4 oz (84.6 kg)     GENERAL:  Well appearing middle aged [place] American male. alert, no distress and comfortable SKIN: skin color, texture, turgor are normal, no rashes or significant lesions EYES: conjunctiva are pink and non-injected, sclera clear LUNGS: clear to auscultation and percussion with normal breathing effort HEART: regular rate & rhythm and no murmurs and no lower extremity edema Musculoskeletal: no cyanosis of digits and no clubbing  PSYCH: alert & oriented x 3, fluent speech NEURO: no focal motor/sensory deficits  LABORATORY DATA:  I have reviewed  the data as listed    Latest Ref Rng & Units 05/21/2022    9:28 AM 02/16/2022    9:45 AM 11/15/2021   10:00 AM  CBC  WBC 4.0 -  10.5 K/uL 7.7  7.7  7.2   Hemoglobin 13.0 - 17.0 g/dL 11.6  11.2  11.2   Hematocrit 39.0 - 52.0 % 36.4  34.3  34.6   Platelets 150 - 400 K/uL 183  211  193        Latest Ref Rng & Units 05/21/2022    9:28 AM 02/16/2022    9:45 AM 11/15/2021   10:00 AM  CMP  Glucose 70 - 99 mg/dL 116  116  123   BUN 8 - 23 mg/dL '26  23  19   '$ Creatinine 0.61 - 1.24 mg/dL 1.65  1.52  1.52   Sodium 135 - 145 mmol/L 140  139  141   Potassium 3.5 - 5.1 mmol/L 4.2  4.4  4.1   Chloride 98 - 111 mmol/L 107  107  108   CO2 22 - 32 mmol/L '27  25  27   '$ Calcium 8.9 - 10.3 mg/dL 10.1  10.3  9.8   Total Protein 6.5 - 8.1 g/dL 8.1  7.9  8.1   Total Bilirubin 0.3 - 1.2 mg/dL 0.3  0.3  0.2   Alkaline Phos 38 - 126 U/L 76  81  81   AST 15 - 41 U/L '17  24  18   '$ ALT 0 - 44 U/L '15  22  17    '$ RADIOGRAPHIC STUDIES: No results found.  ASSESSMENT & PLAN [place]ondale Nies 66 y.o. male with medical history significant for metastatic prostate cancer who presents for a follow up visit.    Previously we discussed the treatment options moving forward including d/c of abiraterone and the addition of enzalutamide to his ADT therapy.  I discussed the side effects of both of these medications and the required follow-up.  After weighing the pros and cons the patient wanted to proceed with the enzalutamide therapy and stop taking the abiraterone therapy. However, he now reports he has never taken the enzalutamide as he is concerned about the side effects. He restarted the abiraterone and has been on this medication since Dec 2021.   The prostate biopsy has been obtained and confirms adenocarcinoma of the prostate. We can began treating with abiraterone 1000 mg p.o. daily with 5 mg of prednisone, however he d/c this due to worsening anxiety/depression. He will continue his q. 3 monthly 22.5 mg Lupron shots  (most recent shot was a '45mg'$  on 03/03/2020, next due March 2022).  Additionally testosterone and PSA levels will be rechecked q 3 months. There is no clear indication for bisphosphonate therapy in the setting of castrate sensitive prostate cancer with metastatic disease to the bone.  After discussion today he was agreeable to continuing Zytiga 1000 mg daily with prednisone.  We will set him up for q 3 months follow ups.  # Metastatic Castrate Sensitive Prostate Cancer  --prostate biopsy performed by urology on 03/30/2020, confirms diagnosis of prostate adenocarcinoma. Gleason score not calculated due to ADT therapy  --the literature does not currently support the use of bisphosphonates in the setting of CSPC.  Plan: --CT C/A/P Scan and NM bone scan last performed in June 2023. Noted to have stable disease. Has stable tumor markers as well Will repeat q 6 months (due in Dec 2023)  --continue calcium/vitamin D for bone protection on ADT therapy.  --d/c abiraterone per patient request and start enzalutamide 160 mg p.o. daily instead.  He unfortunately is not taking these medications.  Discussed with him at length but he does not wish to  take them. --Received dose of lupron 22.5 mg today. Next shot due in Nov 2023  --testosterone at <3 at last check, PSA 1.0 on 05/21/22 --have patient RTC 3 months   # Depression -- Recommend he restart citalopram 20 mg p.o. daily --He is not currently taking this medication   # Symptom management -- oxycodone '5mg'$  q6H PRN for pain.  --encourage OTC senna for constipation prevention if taking opioids.   # Goals of Care --discussed the incurable nature of metastatic disease --emphasized that all treatments moving forward are focused on symptom control, decreasing tumor size/slow spread, and increasing lifespan.  --patient voiced understanding of the palliative nature of treatments moving forward.  --patients MPOA is Chanel Sarina Ill (his daughter)   Orders Placed This  Encounter  Procedures   CT CHEST ABDOMEN PELVIS W CONTRAST    Standing Status:   Future    Standing Expiration Date:   05/28/2023    Order Specific Question:   If indicated for the ordered procedure, I authorize the administration of contrast media per Radiology protocol    Answer:   Yes    Order Specific Question:   Does the patient have a contrast media/X-ray dye allergy?    Answer:   No    Order Specific Question:   Preferred imaging location?    Answer:   [facility]    Order Specific Question:   Is Oral Contrast requested for this exam?    Answer:   Yes, Per Radiology protocol   All questions were answered. The patient knows to call the clinic with any problems, questions or concerns.  A total of more than 30 minutes were spent on this encounter and over half of that time was spent on counseling and coordination of care as outlined above.   Ledell Peoples, MD Department of Hematology/Oncology [place] [facility] Phone: [phone] Pager: [phone] Email: [email or web address]  05/27/2022 5:47 PM   Literature Support:  Juanda Crumble A Systematic Review and Meta-Analysis about the Effect of Bisphosphonates on the Risk of Skeletal-Related Event in Men with Prostate Cancer. Anticancer Agents Med Chem. 2020;20(13):1604-1612.  --Our study demonstrated that bisphosphonates could not statistically significantly reduce the risk of SRE in patients with prostate cancer, neither in the subgroups with M1 or CSPC.

## 2022-05-22 LAB — PROSTATE-SPECIFIC AG, SERUM (LABCORP): Prostate Specific Ag, Serum: 1 ng/mL (ref 0.0–4.0)

## 2022-05-22 LAB — TESTOSTERONE: Testosterone: <3 ng/dL — ABNORMAL LOW (ref 264–916)

## 2022-05-25 ENCOUNTER — Other Ambulatory Visit: Payer: Self-pay

## 2022-05-25 ENCOUNTER — Telehealth: Payer: Self-pay | Admitting: *Deleted

## 2022-05-25 ENCOUNTER — Inpatient Hospital Stay: Payer: Medicaid Other | Attending: Hematology and Oncology | Admitting: Physician Assistant

## 2022-05-25 VITALS — BP 163/83 | HR 86 | Temp 98.6°F | Resp 16 | Wt 186.2 lb

## 2022-05-25 DIAGNOSIS — Z7952 Long term (current) use of systemic steroids: Secondary | ICD-10-CM | POA: Insufficient documentation

## 2022-05-25 DIAGNOSIS — I2510 Atherosclerotic heart disease of native coronary artery without angina pectoris: Secondary | ICD-10-CM | POA: Diagnosis not present

## 2022-05-25 DIAGNOSIS — C7951 Secondary malignant neoplasm of bone: Secondary | ICD-10-CM | POA: Diagnosis present

## 2022-05-25 DIAGNOSIS — Z7982 Long term (current) use of aspirin: Secondary | ICD-10-CM | POA: Diagnosis not present

## 2022-05-25 DIAGNOSIS — Z7902 Long term (current) use of antithrombotics/antiplatelets: Secondary | ICD-10-CM | POA: Diagnosis not present

## 2022-05-25 DIAGNOSIS — C61 Malignant neoplasm of prostate: Secondary | ICD-10-CM | POA: Diagnosis present

## 2022-05-25 DIAGNOSIS — F17210 Nicotine dependence, cigarettes, uncomplicated: Secondary | ICD-10-CM | POA: Diagnosis not present

## 2022-05-25 DIAGNOSIS — I10 Essential (primary) hypertension: Secondary | ICD-10-CM | POA: Insufficient documentation

## 2022-05-25 DIAGNOSIS — Z5111 Encounter for antineoplastic chemotherapy: Secondary | ICD-10-CM | POA: Diagnosis present

## 2022-05-25 DIAGNOSIS — M542 Cervicalgia: Secondary | ICD-10-CM | POA: Diagnosis not present

## 2022-05-25 DIAGNOSIS — Z79899 Other long term (current) drug therapy: Secondary | ICD-10-CM | POA: Insufficient documentation

## 2022-05-25 DIAGNOSIS — I252 Old myocardial infarction: Secondary | ICD-10-CM | POA: Diagnosis not present

## 2022-05-25 NOTE — Progress Notes (Signed)
Symptom Management Consult note [place]    Patient Care Team: Rosilyn Mings. as PCP - General Gwenlyn Found Pearletha Forge, MD as PCP - Cardiology (Cardiology)    Name of the patient: [name] [name]  [id number]  Jun 26, 1955   Date of visit: 05/25/2022   Chief Complaint/Reason for visit: neck pain   Current Therapy: Eligard with last treatment on 05/21/22   ASSESSMENT & PLAN: Patient is a 66 y.o. 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.  I have viewed most recent oncology note and lab work.    #) Metastatic castrate sensitive prostate cancer -Recently had Eligard injection - Next appointment with oncologist is 08/21/22   #) Neck pain -Pain reproducible on exam. No midline tenderness or neuro symptoms. -Pain significantly improved after using hot pack in clinic.Suspect MSK strain. -Discussed OTC symptom management with patient.  If pain does not improve in the next week patient knows to call clinic. Would consider imaging if pain does not improve to r/o metastatic disease, pathologic fracture, cervical stenosis or spondylosis   Strict ED precautions discussed should symptoms worsen.   Heme/Onc History: Oncology History   No history exists.      Interval history-: [name] [name] is a 66 y.o. 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  Patient reports pain started the evening after he received the Eligard injection on Monday. He admits tensing up during the injection because it was very painful however does not remember tensing up like this in the past during an injection.  He describes the neck pain as throbbing.  It is located on both sides of the back of his neck. Pain does not radiate. He has not taken any over-the-counter medications for pain.  He did have an expired half pill of oxycodone that  he tried taking yesterday and did not notice any improvement.  Patient was given a hot pack after he was triaged and tells me that his pain has now resolved.  He did admit that his pain was worse with movement. He denies fever, chills, back pain, numbness, tingling, weakness. Denies IVDA. Denies history of neck injury.    ROS  All other systems are reviewed and are negative for acute change except as noted in the HPI.    No Known Allergies   Past Medical History:  Diagnosis Date   Brain tumor [facility])    diagnosed by MRI 6 months ago   Cancer [facility])    Pt reports brain tumor 2 years ago   Coronary artery disease 2011   s/p BMS stent LAD, [facility], [facility] [place], [place], Dr Chauncey Cruel. [name]   Depression    Dyspnea    High cholesterol    History of herniated intervertebral disc    HTN (hypertension)    Hyperlipidemia LDL goal <70 07/21/2013   Lung nodule    NSTEMI (non-ST elevated myocardial infarction) ([place]) 11/11/2017   Smoker    1/2 ppd x >40 years      Past Surgical History:  Procedure Laterality Date   CORONARY ANGIOPLASTY WITH STENT PLACEMENT Left 2011   BMS LAD, [facility] [place], [place], Dr Chauncey Cruel. [name]   CORONARY STENT INTERVENTION N/A 11/12/2017   Procedure: CORONARY STENT INTERVENTION;  Surgeon: Leonie Man, MD;  Location: [place] [facility];  Service: Cardiovascular;  Laterality: N/A;  LEFT HEART CATH AND CORONARY ANGIOGRAPHY N/A 11/12/2017   Procedure: LEFT HEART CATH AND CORONARY ANGIOGRAPHY;  Surgeon: Leonie Man, MD;  Location: [place] [facility];  Service: Cardiovascular;  Laterality: N/A;    Social History   Socioeconomic History   Marital status: Single    Spouse name: Not on file   Number of children: Not on file   Years of education: Not on file   Highest education level: Not on file  Occupational History   Occupation: unemployed  Tobacco Use   Smoking status: Every Day    Packs/day: 0.25    Years: 45.00    Total pack years:  11.25    Types: Cigarettes   Smokeless tobacco: Never  Vaping Use   Vaping Use: Never used  Substance and Sexual Activity   Alcohol use: Yes    Alcohol/week: 75.0 standard drinks of alcohol    Types: 75 Shots of liquor per week    Comment: 2 shots of liquor daily   Drug use: Not Currently    Types: Marijuana    Comment: occasionally   Sexual activity: Yes  Other Topics Concern   Not on file  Social History Narrative   From [place]    Family lives in [place] (brother, sister and mother)    Unemployed    Drinks 3 (1/5)ths in 1 week   Social Determinants of Health   Financial Resource Strain: Low Risk  (01/22/2020)   Overall Financial Resource Strain (CARDIA)    Difficulty of Paying Living Expenses: Not very hard  Food Insecurity: No Food Insecurity (01/22/2020)   Hunger Vital Sign    Worried About Running Out of Food in the Last Year: Never true    Ran Out of Food in the Last Year: Never true  Transportation Needs: Unmet Transportation Needs (12/14/2021)   PRAPARE - Hydrologist (Medical): Not on file    Lack of Transportation (Non-Medical): Yes  Physical Activity: Inactive (01/22/2020)   Exercise Vital Sign    Days of Exercise per Week: 0 days    Minutes of Exercise per Session: 0 min  Stress: Stress Concern Present (01/22/2020)   [place]    Feeling of Stress : To some extent  Social Connections: Socially Isolated (01/22/2020)   Social Connection and Isolation Panel [NHANES]    Frequency of Communication with Friends and Family: Three times a week    Frequency of Social Gatherings with Friends and Family: Three times a week    Attends Religious Services: Never    Active Member of Clubs or Organizations: No    Attends Archivist Meetings: Never    Marital Status: Never married  Human resources officer Violence: Not on file    Family History  Problem Relation Age of  Onset   Diabetes Paternal Grandfather    Hypertension Paternal Grandfather      Current Outpatient Medications:    abiraterone acetate (ZYTIGA) 250 MG tablet, TAKE 4 TABLETS (1,000 MG TOTAL) BY MOUTH DAILY. TAKE ON AN EMPTY STOMACH 1 HOUR BEFORE OR 2 HOURS AFTER A MEAL (Patient not taking: Reported on 05/21/2022), Disp: 120 tablet, Rfl: 2   aspirin 81 MG EC tablet, Take 1 tablet (81 mg total) by mouth daily. (Patient not taking: Reported on 05/21/2022), Disp: 90 tablet, Rfl: 3   atorvastatin (LIPITOR) 80 MG tablet, Take 1 tablet (80 mg total) by mouth daily., Disp: 90 tablet, Rfl:  3   calcium-vitamin D (OSCAL WITH D) 500-200 MG-UNIT tablet, Take 1 tablet by mouth daily with breakfast. (Patient not taking: Reported on 05/21/2022), Disp: 90 tablet, Rfl: 3   carvedilol (COREG) 6.25 MG tablet, Take 1 tablet (6.25 mg total) by mouth 2 (two) times daily with a meal. (Patient not taking: Reported on 05/21/2022), Disp: 180 tablet, Rfl: 3   clopidogrel (PLAVIX) 75 MG tablet, Take 1 tablet (75 mg total) by mouth daily. (Patient not taking: Reported on 05/21/2022), Disp: 90 tablet, Rfl: 3   enzalutamide (XTANDI) 40 MG capsule, Take 4 capsules (160 mg total) by mouth daily. (Patient not taking: Reported on 05/21/2022), Disp: 120 capsule, Rfl: 3   lisinopril (ZESTRIL) 10 MG tablet, Take 1 tablet (10 mg total) by mouth daily. (Patient not taking: Reported on 05/21/2022), Disp: 90 tablet, Rfl: 3   nitroGLYCERIN (NITROSTAT) 0.4 MG SL tablet, Place 1 tablet (0.4 mg total) under the tongue every 5 (five) minutes as needed for chest pain., Disp: 25 tablet, Rfl: 1   predniSONE (DELTASONE) 5 MG tablet, TAKE 1 TABLET (5 MG TOTAL) BY MOUTH DAILY WITH BREAKFAST. (Patient not taking: Reported on 05/21/2022), Disp: 90 tablet, Rfl: 1  PHYSICAL EXAM: ECOG FS:1 - Symptomatic but completely ambulatory    Vitals:   05/25/22 1500  BP: (!) 163/83  Pulse: 86  Resp: 16  Temp: 98.6 F (37 C)  TempSrc: Oral  SpO2: 97%   Weight: 186 lb 3.2 oz (84.5 kg)   Physical Exam Vitals and nursing note reviewed.  Constitutional:      Appearance: He is well-developed. He is not ill-appearing or toxic-appearing.  HENT:     Head: Normocephalic.     Nose: Nose normal.  Eyes:     Conjunctiva/sclera: Conjunctivae normal.  Neck:     Vascular: No JVD.     Comments: Full ROM intact without spinous process TTP. No bony stepoffs or deformities, tenderness to bilateral paraspinous muscles or cervical spine. No muscle spasms. No rigidity or meningeal signs. No bruising, erythema, or swelling.   Cardiovascular:     Rate and Rhythm: Normal rate and regular rhythm.     Pulses: Normal pulses.          Radial pulses are 2+ on the right side and 2+ on the left side.     Heart sounds: Normal heart sounds.  Pulmonary:     Effort: Pulmonary effort is normal.     Breath sounds: Normal breath sounds.  Abdominal:     General: There is no distension.  Musculoskeletal:     Cervical back: Normal range of motion.     Right lower leg: No edema.     Left lower leg: No edema.  Skin:    General: Skin is warm and dry.     Comments: Equal tactile temperature in all extremites No signs of infection to injection site on right upper extremity  Neurological:     Mental Status: He is oriented to person, place, and time.     Comments: Speech is clear and goal oriented, follows commands Normal strength in upper and lower extremities bilaterally including dorsiflexion and plantar flexion, strong and equal grip strength Sensation normal to light and sharp touch Moves extremities without ataxia, coordination intact Normal finger to nose and rapid alternating movements Normal gait and balance using cane          LABORATORY DATA: I have reviewed the data as listed    Latest Ref Rng & Units 05/21/2022  9:28 AM 02/16/2022    9:45 AM 11/15/2021   10:00 AM  CBC  WBC 4.0 - 10.5 K/uL 7.7  7.7  7.2   Hemoglobin 13.0 - 17.0 g/dL 11.6   11.2  11.2   Hematocrit 39.0 - 52.0 % 36.4  34.3  34.6   Platelets 150 - 400 K/uL 183  211  193         Latest Ref Rng & Units 05/21/2022    9:28 AM 02/16/2022    9:45 AM 11/15/2021   10:00 AM  CMP  Glucose 70 - 99 mg/dL 116  116  123   BUN 8 - 23 mg/dL '26  23  19   '$ Creatinine 0.61 - 1.24 mg/dL 1.65  1.52  1.52   Sodium 135 - 145 mmol/L 140  139  141   Potassium 3.5 - 5.1 mmol/L 4.2  4.4  4.1   Chloride 98 - 111 mmol/L 107  107  108   CO2 22 - 32 mmol/L '27  25  27   '$ Calcium 8.9 - 10.3 mg/dL 10.1  10.3  9.8   Total Protein 6.5 - 8.1 g/dL 8.1  7.9  8.1   Total Bilirubin 0.3 - 1.2 mg/dL 0.3  0.3  0.2   Alkaline Phos 38 - 126 U/L 76  81  81   AST 15 - 41 U/L '17  24  18   '$ ALT 0 - 44 U/L '15  22  17        '$ RADIOGRAPHIC STUDIES (from last 24 hours if applicable) I have personally reviewed the radiological images as listed and agreed with the findings in the report. No results found.      Visit Diagnosis: 1. Neck pain   2. Prostate cancer metastatic to bone ([place])      No orders of the defined types were placed in this encounter.   All questions were answered. The patient knows to call the clinic with any problems, questions or concerns. No barriers to learning was detected.  I have spent a total of 20 minutes minutes of face-to-face and non-face-to-face time, preparing to see the patient, obtaining and/or reviewing separately obtained history, performing a medically appropriate examination, counseling and educating the patient, documenting clinical information in the electronic health record  Thank you for allowing me to participate in the care of this patient.    Barrie Folk, PA-C Department of Hematology/Oncology [facility] Phone: [phone]  [phone]    05/25/2022 5:00 PM

## 2022-05-25 NOTE — Telephone Encounter (Signed)
Received call from patient. He states he received  his Eligard injection on Monday in his right arm. Then he states his right shoulder and right neck have been hurting quite a lot. He is unsure if this pain is related to his injection or not. He states there was a lot of force used when he received the injection. Advised that we can see him in our Symptom Management Clinic this afternoon to evaluate his arm and neck. Pt is agreeable as it is very painful. Advised to be here  by 3pm and he will be seen in the [facility]. Pt voiced understanding.

## 2022-05-27 ENCOUNTER — Encounter: Payer: Self-pay | Admitting: Hematology and Oncology

## 2022-06-29 ENCOUNTER — Other Ambulatory Visit (HOSPITAL_COMMUNITY): Payer: Self-pay

## 2022-07-04 ENCOUNTER — Telehealth: Payer: Self-pay

## 2022-07-04 ENCOUNTER — Ambulatory Visit (HOSPITAL_COMMUNITY)
Admission: RE | Admit: 2022-07-04 | Discharge: 2022-07-04 | Disposition: A | Discharge status: Home / Self Care | Payer: Medicaid Other | Source: Ambulatory Visit | Attending: Hematology and Oncology | Admitting: Hematology and Oncology

## 2022-07-04 DIAGNOSIS — C61 Malignant neoplasm of prostate: Secondary | ICD-10-CM | POA: Diagnosis present

## 2022-07-04 LAB — POCT I-STAT CREATININE: Creatinine, Ser: 1.8 mg/dL — ABNORMAL HIGH (ref 0.61–1.24)

## 2022-07-04 MED ORDER — SODIUM CHLORIDE (PF) 0.9 % IJ SOLN
INTRAMUSCULAR | Status: AC
  Filled 2022-07-04: qty 50

## 2022-07-04 MED ORDER — IOHEXOL 300 MG/ML  SOLN
100.0000 mL | Freq: Once | INTRAMUSCULAR | Status: AC | PRN
  Administered 2022-07-04: 80 mL via INTRAVENOUS

## 2022-07-04 NOTE — Telephone Encounter (Addendum)
Called patient to advise of message below. Patient appreciative.   ----- Message from Orson Slick, MD sent at 07/04/2022  1:40 PM EST ----- Please let Mr. Borum know that his CT scan does not show any new or worsening disease. Overall the scan is stable. We will plan to see him back as scheduled on 08/21/2021.   ----- Message ----- From: Interface, Rad Results In Sent: 07/04/2022   1:08 PM EST To: Orson Slick, MD

## 2022-07-17 ENCOUNTER — Ambulatory Visit: Payer: Medicaid Other | Admitting: Cardiovascular Disease

## 2022-08-21 ENCOUNTER — Other Ambulatory Visit: Payer: Self-pay | Admitting: Hematology and Oncology

## 2022-08-21 ENCOUNTER — Inpatient Hospital Stay: Payer: Medicaid Other | Attending: Hematology and Oncology

## 2022-08-21 ENCOUNTER — Inpatient Hospital Stay (HOSPITAL_BASED_OUTPATIENT_CLINIC_OR_DEPARTMENT_OTHER): Payer: Medicaid Other | Admitting: Hematology and Oncology

## 2022-08-21 ENCOUNTER — Inpatient Hospital Stay: Payer: Medicaid Other

## 2022-08-21 VITALS — BP 141/80 | HR 86 | Temp 98.4°F | Resp 15 | Wt 181.7 lb

## 2022-08-21 DIAGNOSIS — C61 Malignant neoplasm of prostate: Secondary | ICD-10-CM

## 2022-08-21 DIAGNOSIS — R590 Localized enlarged lymph nodes: Secondary | ICD-10-CM

## 2022-08-21 DIAGNOSIS — Z5111 Encounter for antineoplastic chemotherapy: Secondary | ICD-10-CM | POA: Insufficient documentation

## 2022-08-21 DIAGNOSIS — C7951 Secondary malignant neoplasm of bone: Secondary | ICD-10-CM

## 2022-08-21 DIAGNOSIS — Z79899 Other long term (current) drug therapy: Secondary | ICD-10-CM | POA: Diagnosis not present

## 2022-08-21 LAB — CMP (CANCER CENTER ONLY)
ALT: 14 U/L (ref 0–44)
AST: 15 U/L (ref 15–41)
Albumin: 4.3 g/dL (ref 3.5–5.0)
Alkaline Phosphatase: 78 U/L (ref 38–126)
Anion gap: 9 (ref 5–15)
BUN: 18 mg/dL (ref 8–23)
CO2: 25 mmol/L (ref 22–32)
Calcium: 9.4 mg/dL (ref 8.9–10.3)
Chloride: 105 mmol/L (ref 98–111)
Creatinine: 1.44 mg/dL — ABNORMAL HIGH (ref 0.61–1.24)
GFR, Estimated: 54 mL/min — ABNORMAL LOW (ref >60)
Glucose, Bld: 109 mg/dL — ABNORMAL HIGH (ref 70–99)
Potassium: 4.1 mmol/L (ref 3.5–5.1)
Sodium: 139 mmol/L (ref 135–145)
Total Bilirubin: 0.3 mg/dL (ref 0.3–1.2)
Total Protein: 8.1 g/dL (ref 6.5–8.1)

## 2022-08-21 LAB — CBC WITH DIFFERENTIAL (CANCER CENTER ONLY)
Abs Immature Granulocytes: 0.03 10*3/uL (ref 0.00–0.07)
Basophils Absolute: 0.1 10*3/uL (ref 0.0–0.1)
Basophils Relative: 1 %
Eosinophils Absolute: 0.2 10*3/uL (ref 0.0–0.5)
Eosinophils Relative: 2 %
HCT: 36 % — ABNORMAL LOW (ref 39.0–52.0)
Hemoglobin: 11.7 g/dL — ABNORMAL LOW (ref 13.0–17.0)
Immature Granulocytes: 0 %
Lymphocytes Relative: 29 %
Lymphs Abs: 2.3 10*3/uL (ref 0.7–4.0)
MCH: 27.6 pg (ref 26.0–34.0)
MCHC: 32.5 g/dL (ref 30.0–36.0)
MCV: 84.9 fL (ref 80.0–100.0)
Monocytes Absolute: 0.6 10*3/uL (ref 0.1–1.0)
Monocytes Relative: 7 %
Neutro Abs: 4.8 10*3/uL (ref 1.7–7.7)
Neutrophils Relative %: 61 %
Platelet Count: 203 10*3/uL (ref 150–400)
RBC: 4.24 MIL/uL (ref 4.22–5.81)
RDW: 14.5 % (ref 11.5–15.5)
WBC Count: 7.9 10*3/uL (ref 4.0–10.5)
nRBC: 0 % (ref 0.0–0.2)

## 2022-08-21 MED ORDER — LEUPROLIDE ACETATE (3 MONTH) 22.5 MG ~~LOC~~ KIT
22.5000 mg | PACK | Freq: Once | SUBCUTANEOUS | Status: AC
  Administered 2022-08-21: 22.5 mg via SUBCUTANEOUS
  Filled 2022-08-21: qty 22.5

## 2022-08-21 NOTE — Progress Notes (Signed)
[place] [phone]   [phone]  PROGRESS NOTE  Patient Care Team: Rosilyn Mings. 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 Seen by pulmonary with plans for PET and biopsy, though he was lost to follow up.  3) 01/17/2020: sent to the ED by Orthopedic surgery due to metastasis of the spine noted on MRI. IN the ED CT C/A/P showed extensive left pelvic and retroperitoneal lymphadenopathy. Mediastinal and bilateral hilar lymphadenopathy as well as mixed lytic and sclerotic lesions are noted in the bony pelvis 4) 01/17/2020: PSA noted to be 1074.94 5) 01/22/2020: establish care with [name]. Started Bicalutamide '50mg'$  PO daily. Plan for 28 days of therapy.   6) 02/05/2020: Lupron 22.'5mg'$  started.  7) 03/03/2020: received '45mg'$  lupron with urology. PSA 9.6 8) 03/30/2020: prostate biopsy performed, confirmed adenocarcinoma of the prostate.  9) 04/25/2020: stopped Zytiga therapy after < 7 days due to worsening depression 10) 04/28/2020: received enzalutamide '160mg'$ , did not take any doses 11) 06/15/2020: agreed to restart Zytiga '1000mg'$  daily with prednisone '5mg'$   12) 09/08/2020: Lupron 22.'5mg'$  shot.  13) 12/02/2020: Lupron 22.'5mg'$  shot.  14) 03/01/2021: Lupron 22.'5mg'$  shot. 15) 05/24/2021: Lupron 22.'5mg'$  shot. 16) 08/16/2021: Lupron 22.'5mg'$  shot. 17) 11/15/2021: Lupron 22.5 mg shot. Patient reports he is not taking zytiga as prescribed.   Interval History:  [name] [name] 67 y.o. male with medical history significant for metastatic prostate cancer who presents  for a follow up visit. The patient's last visit was on 05/21/2022. In the interim since the last visit [name] [name] has continued on lupron therapy.   On exam today [name] [name] reports he has been "doing all right" in the interim since our last visit.  He did have some difficulty with his last Lupron shot is administered "forcefully" into his arm.  He reports that he does not have any other major side effects as result of his Lupron shots.  He reports his energy is "about the same".  Unfortunately all he does is "to sit around and watch classic TV".  He enjoys crime traumas with [employer] and cowboy television shows.  He reports that he is eating well and his weight is currently down about 5 pounds from our last visit, down to 181 pounds.  He reports that he continues to drink alcohol approximately 2 shots per day.  Overall he is at his baseline level of health with no questions concerns or complaints..  He denies any fevers, chills, nausea, vomiting or diarrhea.  Full 10 point ROS is listed below.  Previously we discussed the importance of continuing Zytiga therapy and the excellent control of his disease we have with Lupron and Zytiga.  He had previously been prescribed citalopram therapy to help with his depression symptoms.  He is no longer interested in restarting enzalutamide therapy 160 mg p.o. daily.  MEDICAL HISTORY:  Past Medical History:  Diagnosis Date   Brain tumor [facility])    diagnosed by MRI 6 months ago   Cancer [facility]    Pt reports brain tumor 2 years ago   Coronary artery disease 2011   s/p BMS stent LAD, [facility], [place],  [place], Dr Chauncey Cruel. [name]   Depression    Dyspnea    High cholesterol    History of herniated intervertebral disc    HTN (hypertension)    Hyperlipidemia LDL goal <70 07/21/2013   Lung nodule    NSTEMI (non-ST elevated myocardial infarction) ([place]) 11/11/2017   Smoker    1/2 ppd x >40 years     SURGICAL HISTORY: Past Surgical History:   Procedure Laterality Date   CORONARY ANGIOPLASTY WITH STENT PLACEMENT Left 2011   BMS LAD, [facility], [place], [place], Dr Chauncey Cruel. [name]   CORONARY STENT INTERVENTION N/A 11/12/2017   Procedure: CORONARY STENT INTERVENTION;  Surgeon: Leonie Man, MD;  Location: [place] [facility];  Service: Cardiovascular;  Laterality: N/A;   LEFT HEART CATH AND CORONARY ANGIOGRAPHY N/A 11/12/2017   Procedure: LEFT HEART CATH AND CORONARY ANGIOGRAPHY;  Surgeon: Leonie Man, MD;  Location: [place] [facility];  Service: Cardiovascular;  Laterality: N/A;    SOCIAL HISTORY: Social History   Socioeconomic History   Marital status: Single    Spouse name: Not on file   Number of children: Not on file   Years of education: Not on file   Highest education level: Not on file  Occupational History   Occupation: unemployed  Tobacco Use   Smoking status: Every Day    Packs/day: 0.25    Years: 45.00    Total pack years: 11.25    Types: Cigarettes   Smokeless tobacco: Never  Vaping Use   Vaping Use: Never used  Substance and Sexual Activity   Alcohol use: Yes    Alcohol/week: 75.0 standard drinks of alcohol    Types: 75 Shots of liquor per week    Comment: 2 shots of liquor daily   Drug use: Not Currently    Types: Marijuana    Comment: occasionally   Sexual activity: Yes  Other Topics Concern   Not on file  Social History Narrative   From [place]    Family lives in [place] (brother, sister and mother)    Unemployed    Drinks 3 (1/5)ths in 1 week   Social Determinants of Health   Financial Resource Strain: Low Risk  (01/22/2020)   Overall Financial Resource Strain (CARDIA)    Difficulty of Paying Living Expenses: Not very hard  Food Insecurity: No Food Insecurity (01/22/2020)   Hunger Vital Sign    Worried About Running Out of Food in the Last Year: Never true    Ran Out of Food in the Last Year: Never true  Transportation Needs: Unmet Transportation Needs (12/14/2021)    PRAPARE - Hydrologist (Medical): Not on file    Lack of Transportation (Non-Medical): Yes  Physical Activity: Inactive (01/22/2020)   Exercise Vital Sign    Days of Exercise per Week: 0 days    Minutes of Exercise per Session: 0 min  Stress: Stress Concern Present (01/22/2020)   [place]    Feeling of Stress : To some extent  Social Connections: Socially Isolated (01/22/2020)   Social Connection and Isolation Panel [NHANES]    Frequency of Communication with Friends and Family: Three times a week    Frequency of Social Gatherings with Friends and Family: Three times a week    Attends Religious Services: Never    Active Member of Clubs or Organizations: No    Attends Archivist Meetings: Never  Marital Status: Never married  Human resources officer Violence: Not on file    FAMILY HISTORY: Family History  Problem Relation Age of Onset   Diabetes Paternal Grandfather    Hypertension Paternal Grandfather     ALLERGIES:  has No Known Allergies.  MEDICATIONS:  Current Outpatient Medications  Medication Sig Dispense Refill   abiraterone acetate (ZYTIGA) 250 MG tablet TAKE 4 TABLETS (1,000 MG TOTAL) BY MOUTH DAILY. TAKE ON AN EMPTY STOMACH 1 HOUR BEFORE OR 2 HOURS AFTER A MEAL (Patient not taking: Reported on 05/21/2022) 120 tablet 2   aspirin 81 MG EC tablet Take 1 tablet (81 mg total) by mouth daily. (Patient not taking: Reported on 05/21/2022) 90 tablet 3   atorvastatin (LIPITOR) 80 MG tablet Take 1 tablet (80 mg total) by mouth daily. 90 tablet 3   calcium-vitamin D (OSCAL WITH D) 500-200 MG-UNIT tablet Take 1 tablet by mouth daily with breakfast. (Patient not taking: Reported on 05/21/2022) 90 tablet 3   carvedilol (COREG) 6.25 MG tablet Take 1 tablet (6.25 mg total) by mouth 2 (two) times daily with a meal. (Patient not taking: Reported on 05/21/2022) 180 tablet 3   clopidogrel  (PLAVIX) 75 MG tablet Take 1 tablet (75 mg total) by mouth daily. (Patient not taking: Reported on 05/21/2022) 90 tablet 3   enzalutamide (XTANDI) 40 MG capsule Take 4 capsules (160 mg total) by mouth daily. (Patient not taking: Reported on 05/21/2022) 120 capsule 3   lisinopril (ZESTRIL) 10 MG tablet Take 1 tablet (10 mg total) by mouth daily. (Patient not taking: Reported on 05/21/2022) 90 tablet 3   nitroGLYCERIN (NITROSTAT) 0.4 MG SL tablet Place 1 tablet (0.4 mg total) under the tongue every 5 (five) minutes as needed for chest pain. 25 tablet 1   predniSONE (DELTASONE) 5 MG tablet TAKE 1 TABLET (5 MG TOTAL) BY MOUTH DAILY WITH BREAKFAST. (Patient not taking: Reported on 05/21/2022) 90 tablet 1   No current facility-administered medications for this visit.    REVIEW OF SYSTEMS:   Constitutional: ( - ) fevers, ( - )  chills , ( - ) night sweats Eyes: ( - ) blurriness of vision, ( - ) double vision, ( - ) watery eyes Ears, nose, mouth, throat, and face: ( - ) mucositis, ( - ) sore throat Respiratory: ( - ) cough, ( - ) dyspnea, ( - ) wheezes Cardiovascular: ( - ) palpitation, ( - ) chest discomfort, ( - ) lower extremity swelling Gastrointestinal:  ( - ) nausea, ( - ) heartburn, ( - ) change in bowel habits Skin: ( - ) abnormal skin rashes Lymphatics: ( - ) new lymphadenopathy, ( - ) easy bruising Neurological: ( - ) numbness, ( - ) tingling, ( - ) new weaknesses Behavioral/Psych: ( - ) mood change, ( - ) new changes  All other systems were reviewed with the patient and are negative.  PHYSICAL EXAMINATION: ECOG PERFORMANCE STATUS: 1 - Symptomatic but completely ambulatory  Vitals:   08/21/22 1013  BP: (!) 141/80  Pulse: 86  Resp: 15  Temp: 98.4 F (36.9 C)  SpO2: 100%    Filed Weights   08/21/22 1013  Weight: 181 lb 11.2 oz (82.4 kg)     GENERAL:  Well appearing middle aged [place] American male. alert, no distress and comfortable SKIN: skin color, texture, turgor are  normal, no rashes or significant lesions EYES: conjunctiva are pink and non-injected, sclera clear LUNGS: clear to auscultation and percussion with normal breathing effort HEART:  regular rate & rhythm and no murmurs and no lower extremity edema Musculoskeletal: no cyanosis of digits and no clubbing  PSYCH: alert & oriented x 3, fluent speech NEURO: no focal motor/sensory deficits  LABORATORY DATA:  I have reviewed the data as listed    Latest Ref Rng & Units 08/21/2022    9:23 AM 05/21/2022    9:28 AM 02/16/2022    9:45 AM  CBC  WBC 4.0 - 10.5 K/uL 7.9  7.7  7.7   Hemoglobin 13.0 - 17.0 g/dL 11.7  11.6  11.2   Hematocrit 39.0 - 52.0 % 36.0  36.4  34.3   Platelets 150 - 400 K/uL 203  183  211        Latest Ref Rng & Units 08/21/2022    9:23 AM 07/04/2022   10:36 AM 05/21/2022    9:28 AM  CMP  Glucose 70 - 99 mg/dL 109   116   BUN 8 - 23 mg/dL 18   26   Creatinine 0.61 - 1.24 mg/dL 1.44  1.80  1.65   Sodium 135 - 145 mmol/L 139   140   Potassium 3.5 - 5.1 mmol/L 4.1   4.2   Chloride 98 - 111 mmol/L 105   107   CO2 22 - 32 mmol/L 25   27   Calcium 8.9 - 10.3 mg/dL 9.4   10.1   Total Protein 6.5 - 8.1 g/dL 8.1   8.1   Total Bilirubin 0.3 - 1.2 mg/dL 0.3   0.3   Alkaline Phos 38 - 126 U/L 78   76   AST 15 - 41 U/L 15   17   ALT 0 - 44 U/L 14   15    RADIOGRAPHIC STUDIES: No results found.  ASSESSMENT & PLAN [name] [name] 67 y.o. male with medical history significant for metastatic prostate cancer who presents for a follow up visit.    Previously we discussed the treatment options moving forward including d/c of abiraterone and the addition of enzalutamide to his ADT therapy.  I discussed the side effects of both of these medications and the required follow-up.  After weighing the pros and cons the patient wanted to proceed with the enzalutamide therapy and stop taking the abiraterone therapy. However, he now reports he has never taken the enzalutamide as he is concerned  about the side effects. He restarted the abiraterone and has been on this medication since Dec 2021.   The prostate biopsy has been obtained and confirms adenocarcinoma of the prostate. We can began treating with abiraterone 1000 mg p.o. daily with 5 mg of prednisone, however he d/c this due to worsening anxiety/depression. He will continue his q. 3 monthly 22.5 mg Lupron shots (most recent shot was a '45mg'$  on 03/03/2020, next due March 2022).  Additionally testosterone and PSA levels will be rechecked q 3 months. There is no clear indication for bisphosphonate therapy in the setting of castrate sensitive prostate cancer with metastatic disease to the bone.  After discussion today he was agreeable to continuing Zytiga 1000 mg daily with prednisone.  We will set him up for q 3 months follow ups.  # Metastatic Castrate Sensitive Prostate Cancer  --prostate biopsy performed by urology on 03/30/2020, confirms diagnosis of prostate adenocarcinoma. Gleason score not calculated due to ADT therapy  --the literature does not currently support the use of bisphosphonates in the setting of CSPC.  Plan: --CT C/A/P Scan and NM bone scan last performed in June  2023. Noted to have stable disease. Has stable tumor markers as well Will repeat q 6 months (due in Dec 2023)  --continue calcium/vitamin D for bone protection on ADT therapy.  --d/c abiraterone per patient request and start enzalutamide 160 mg p.o. daily instead.  He unfortunately is not taking either of these medications.  Discussed with him at length but he does not wish to take them. --Received dose of lupron 22.5 mg today. Next shot due in May 2024 --testosterone at <3 at last check, PSA 1.0 on 05/21/22 (up from <0.1  in 2022) --labs today show white blood cell 7.9, hemoglobin 1.7, MCV 84.9, and platelets of 203 --have patient RTC 3 months   # Depression -- Recommend he restart citalopram 20 mg p.o. daily --He is not currently taking this medication   #  Symptom management -- oxycodone '5mg'$  q6H PRN for pain.  --encourage OTC senna for constipation prevention if taking opioids.   # Goals of Care --discussed the incurable nature of metastatic disease --emphasized that all treatments moving forward are focused on symptom control, decreasing tumor size/slow spread, and increasing lifespan.  --patient voiced understanding of the palliative nature of treatments moving forward.  --patients MPOA is Chanel Sarina Ill (his daughter)   No orders of the defined types were placed in this encounter.  All questions were answered. The patient knows to call the clinic with any problems, questions or concerns.  A total of more than 30 minutes were spent on this encounter and over half of that time was spent on counseling and coordination of care as outlined above.   [name] Peoples, MD Department of Hematology/Oncology [place] [facility] Phone: [phone] Pager: [phone] Email: [email or web address]  08/21/2022 3:23 PM   Literature Support:  Juanda Crumble A Systematic Review and Meta-Analysis about the Effect of Bisphosphonates on the Risk of Skeletal-Related Event in Men with Prostate Cancer. Anticancer Agents Med Chem. 2020;20(13):1604-1612.  --Our study demonstrated that bisphosphonates could not statistically significantly reduce the risk of SRE in patients with prostate cancer, neither in the subgroups with M1 or CSPC.

## 2022-08-22 ENCOUNTER — Telehealth: Payer: Self-pay | Admitting: Hematology and Oncology

## 2022-08-22 ENCOUNTER — Encounter: Payer: Self-pay | Admitting: Cardiovascular Disease

## 2022-08-22 ENCOUNTER — Ambulatory Visit: Payer: Medicaid Other | Attending: Cardiovascular Disease | Admitting: Cardiovascular Disease

## 2022-08-22 VITALS — BP 122/60 | HR 73 | Ht 74.0 in | Wt 182.0 lb

## 2022-08-22 DIAGNOSIS — R0989 Other specified symptoms and signs involving the circulatory and respiratory systems: Secondary | ICD-10-CM

## 2022-08-22 DIAGNOSIS — Z9861 Coronary angioplasty status: Secondary | ICD-10-CM

## 2022-08-22 DIAGNOSIS — I10 Essential (primary) hypertension: Secondary | ICD-10-CM

## 2022-08-22 DIAGNOSIS — I2510 Atherosclerotic heart disease of native coronary artery without angina pectoris: Secondary | ICD-10-CM

## 2022-08-22 DIAGNOSIS — E785 Hyperlipidemia, unspecified: Secondary | ICD-10-CM

## 2022-08-22 DIAGNOSIS — Z91148 Patient's other noncompliance with medication regimen for other reason: Secondary | ICD-10-CM

## 2022-08-22 DIAGNOSIS — F17200 Nicotine dependence, unspecified, uncomplicated: Secondary | ICD-10-CM

## 2022-08-22 MED ORDER — CARVEDILOL 6.25 MG PO TABS: 6.2500 mg | ORAL_TABLET | Freq: Two times a day (BID) | ORAL | 3 refills | Status: DC

## 2022-08-22 MED ORDER — ATORVASTATIN CALCIUM 80 MG PO TABS: 80.0000 mg | ORAL_TABLET | Freq: Every day | ORAL | 3 refills | Status: DC

## 2022-08-22 MED ORDER — CLOPIDOGREL BISULFATE 75 MG PO TABS: 75.0000 mg | ORAL_TABLET | Freq: Every day | ORAL | 3 refills | Status: DC

## 2022-08-22 MED ORDER — LISINOPRIL 10 MG PO TABS: 10.0000 mg | ORAL_TABLET | Freq: Every day | ORAL | 3 refills | Status: DC

## 2022-08-22 NOTE — Patient Instructions (Signed)
Medication Instructions:  Your physician recommends that you continue on your current medications as directed. Please refer to the Current Medication list given to you today.  *If you need a refill on your cardiac medications before your next appointment, please call your pharmacy*   Lab Work: Your physician recommends that you return for lab work in: next week or 2 for FASTING lipid/liver panel   If you have labs (blood work) drawn today and your tests are completely normal, you will receive your results only by: [place] (if you have MyChart) OR A paper copy in the mail If you have any lab test that is abnormal or we need to change your treatment, we will call you to review the results.   Testing/Procedures: Your physician has requested that you have a carotid duplex. This test is an ultrasound of the carotid arteries in your neck. It looks at blood flow through these arteries that supply the brain with blood. Allow one hour for this exam. There are no restrictions or special instructions. This will take place at [place], [address].    Follow-Up: At [facility], you and your health needs are our priority.  As part of our continuing mission to provide you with exceptional heart care, we have created designated Provider Care Teams.  These Care Teams include your primary Cardiologist (physician) and Advanced Practice Providers (APPs -  Physician Assistants and Nurse Practitioners) who all work together to provide you with the care you need, when you need it.  We recommend signing up for the patient portal called "MyChart".  Sign up information is provided on this After Visit Summary.  MyChart is used to connect with patients for Virtual Visits (Telemedicine).  Patients are able to view lab/test results, encounter notes, upcoming appointments, etc.  Non-urgent messages can be sent to your provider as well.   To learn more about what you can do with MyChart, go to  [email or web address].    Your next appointment:   12 month(s)  Provider:   Quay Burow, MD

## 2022-08-22 NOTE — Assessment & Plan Note (Signed)
Alcohol abuse drinking at least 2 shots of alcohol on a daily basis.

## 2022-08-22 NOTE — Assessment & Plan Note (Addendum)
Ongoing tobacco use of half pack a day recalcitrant to risk factor modification.

## 2022-08-22 NOTE — Assessment & Plan Note (Signed)
History of essential hypertension blood pressure measured today at 122/60.  He is supposed to be taking carvedilol and lisinopril although he admits to not taking any of his medicines.

## 2022-08-22 NOTE — Progress Notes (Signed)
08/22/2022 [name] [name]   01/16/1956  [id number]  Primary Physician [facility] Primary Cardiologist: Lorretta Harp MD [name] [name], [place]  HPI:  [name] [name] is a 67 y.o.   thin appearing single African-American male father of 2 children, grandfather 3 grandchildren who currently does not work.  I last saw him in the office 04/28/2021.Marland Kitchen His risk factors include 45-pack-year tobacco abuse currently smoking 4 to 5 cigarettes a day down from a pack a day 6 months ago.  History of hypertension hyperlipidemia.  He had a stent implanted in his LAD 04/05/2010 at [facility] ([facility]).  He was admitted to [facility] 11/11/2017 with chest pain and non-STEMI and underwent cardiac catheterization by Dr. Ellyn Hack the following day revealing patent LAD stent with high-grade tandem lesions in the circumflex obtuse marginal branch which were stented with overlapping synergy drug-eluting stents.  The distal lesion in the AV groove was left untreated.  He had normal LV function.  Her last several months he is noted increasing dyspnea on exertion but denies chest pain.   A 2D echo was performed 02/01/2020 that was essentially normal.    I was going to get a Myoview stress test but this was never performed however in the interim the shortness of breath which she was complaining of has completely resolved.  He currently denies chest pain.  Unfortunately, he never started his statin drug with lipid profile performed 12/23/2019 revealed a total cholesterol of 244, LDL 175 and HDL 42..  Since I saw him a year ago he has remained asymptomatic.  He does have prostate cancer.  He continues to smoke 1/2 pack/day.  He also has been noncompliant with his medications.  He tells me that he is now drinking 2 shots of hard liquor a day.   Current Meds  Medication Sig   aspirin 81 MG EC tablet Take 1 tablet (81 mg total) by mouth daily.   atorvastatin (LIPITOR) 80 MG tablet Take  1 tablet (80 mg total) by mouth daily.   calcium-vitamin D (OSCAL WITH D) 500-200 MG-UNIT tablet Take 1 tablet by mouth daily with breakfast.   carvedilol (COREG) 6.25 MG tablet Take 1 tablet (6.25 mg total) by mouth 2 (two) times daily with a meal.   clopidogrel (PLAVIX) 75 MG tablet Take 1 tablet (75 mg total) by mouth daily.   enzalutamide (XTANDI) 40 MG capsule Take 4 capsules (160 mg total) by mouth daily.   lisinopril (ZESTRIL) 10 MG tablet Take 1 tablet (10 mg total) by mouth daily.   nitroGLYCERIN (NITROSTAT) 0.4 MG SL tablet Place 1 tablet (0.4 mg total) under the tongue every 5 (five) minutes as needed for chest pain.   predniSONE (DELTASONE) 5 MG tablet TAKE 1 TABLET (5 MG TOTAL) BY MOUTH DAILY WITH BREAKFAST.     No Known Allergies  Social History   Socioeconomic History   Marital status: Single    Spouse name: Not on file   Number of children: Not on file   Years of education: Not on file   Highest education level: Not on file  Occupational History   Occupation: unemployed  Tobacco Use   Smoking status: Every Day    Packs/day: 0.25    Years: 45.00    Total pack years: 11.25    Types: Cigarettes   Smokeless tobacco: Never  Vaping Use   Vaping Use: Never used  Substance and Sexual Activity   Alcohol use: Yes  Alcohol/week: 75.0 standard drinks of alcohol    Types: 75 Shots of liquor per week    Comment: 2 shots of liquor daily   Drug use: Not Currently    Types: Marijuana    Comment: occasionally   Sexual activity: Yes  Other Topics Concern   Not on file  Social History Narrative   From [place]    Family lives in [place] (brother, sister and mother)    Unemployed    Drinks 3 (1/5)ths in 1 week   Social Determinants of Health   Financial Resource Strain: Low Risk  (01/22/2020)   Overall Financial Resource Strain (CARDIA)    Difficulty of Paying Living Expenses: Not very hard  Food Insecurity: No Food Insecurity (01/22/2020)   Hunger Vital Sign     Worried About Running Out of Food in the Last Year: Never true    Ran Out of Food in the Last Year: Never true  Transportation Needs: Unmet Transportation Needs (12/14/2021)   PRAPARE - Hydrologist (Medical): Not on file    Lack of Transportation (Non-Medical): Yes  Physical Activity: Inactive (01/22/2020)   Exercise Vital Sign    Days of Exercise per Week: 0 days    Minutes of Exercise per Session: 0 min  Stress: Stress Concern Present (01/22/2020)   [place]    Feeling of Stress : To some extent  Social Connections: Socially Isolated (01/22/2020)   Social Connection and Isolation Panel [NHANES]    Frequency of Communication with Friends and Family: Three times a week    Frequency of Social Gatherings with Friends and Family: Three times a week    Attends Religious Services: Never    Active Member of Clubs or Organizations: No    Attends Archivist Meetings: Never    Marital Status: Never married  Human resources officer Violence: Not on file     Review of Systems: General: negative for chills, fever, night sweats or weight changes.  Cardiovascular: negative for chest pain, dyspnea on exertion, edema, orthopnea, palpitations, paroxysmal nocturnal dyspnea or shortness of breath Dermatological: negative for rash Respiratory: negative for cough or wheezing Urologic: negative for hematuria Abdominal: negative for nausea, vomiting, diarrhea, bright red blood per rectum, melena, or hematemesis Neurologic: negative for visual changes, syncope, or dizziness All other systems reviewed and are otherwise negative except as noted above.    Blood pressure 122/60, pulse 73, height '6\' 2"'$  (1.88 m), weight 182 lb (82.6 kg), SpO2 95 %.  General appearance: alert and no distress Neck: no adenopathy, no JVD, supple, symmetrical, trachea midline, thyroid not enlarged, symmetric, no  tenderness/mass/nodules, and right carotid bruit Lungs: clear to auscultation bilaterally Heart: regular rate and rhythm, S1, S2 normal, no murmur, click, rub or gallop Extremities: extremities normal, atraumatic, no cyanosis or edema Pulses: 2+ and symmetric Skin: Skin color, texture, turgor normal. No rashes or lesions Neurologic: Grossly normal  EKG sinus rhythm at 73 with LVH voltage.  Personally reviewed this EKG.  ASSESSMENT AND PLAN:   Essential hypertension History of essential hypertension blood pressure measured today at 122/60.  He is supposed to be taking carvedilol and lisinopril although he admits to not taking any of his medicines.    Current smoker Ongoing tobacco use of half pack a day recalcitrant to risk factor modification.  Alcohol abuse Alcohol abuse drinking at least 2 shots of alcohol on a daily basis.  CAD S/P percutaneous coronary  angioplasty History of CAD status post LAD stenting 04/05/2010 at [facility] in the [place] ([facility]).  Non-STEMI 11/11/2017 at [facility] with cath performed Dr. Ellyn Hack revealing a patent LAD stent with high-grade tandem circumflex and obtuse marginal branch lesions which were stented using overlapping Synergy drug-eluting stents.  He had normal LV function.  He denies chest pain or shortness of breath.  He is currently not taking any medications.  H/O medication noncompliance Since I saw him over a year ago he has not taken any of his medications consistent with his history of noncompliance.  If this is true next time he is seen he will be discharged from the practice.  Hyperlipidemia LDL goal <70 History of hyperlipidemia supposed to be on Lipitor 80 mg a day which she is not taking.  His last lipid profile performed 04/28/2021 revealed total cholesterol 272, LDL 164 and HDL 35.  I am going to recheck a fasting lipid liver profile.     Lorretta Harp MD FACP,FACC,FAHA, [facility] 08/22/2022 11:00 AM

## 2022-08-22 NOTE — Telephone Encounter (Signed)
Called patient per 2/27 los notes to schedule f/u. Left voicemail with new appointment information and contact details if needing to reschedule. Mailing patient calendars.

## 2022-08-22 NOTE — Assessment & Plan Note (Signed)
History of CAD status post LAD stenting 04/05/2010 at [facility] in the [place] ([facility]).  Non-STEMI 11/11/2017 at [facility] with cath performed Dr. Ellyn Hack revealing a patent LAD stent with high-grade tandem circumflex and obtuse marginal branch lesions which were stented using overlapping Synergy drug-eluting stents.  [name] [name] had normal LV function.  [name] [name] denies chest pain or shortness of breath.  [name] [name] is currently not taking any medications.

## 2022-08-22 NOTE — Assessment & Plan Note (Signed)
Since I saw him over a year ago he has not taken any of his medications consistent with his history of noncompliance.  If this is true next time he is seen he will be discharged from the practice.

## 2022-08-22 NOTE — Assessment & Plan Note (Signed)
History of hyperlipidemia supposed to be on Lipitor 80 mg a day which she is not taking.  His last lipid profile performed 04/28/2021 revealed total cholesterol 272, LDL 164 and HDL 35.  I am going to recheck a fasting lipid liver profile.

## 2022-08-23 LAB — PROSTATE-SPECIFIC AG, SERUM (LABCORP): Prostate Specific Ag, Serum: 1.2 ng/mL (ref 0.0–4.0)

## 2022-08-23 LAB — TESTOSTERONE: Testosterone: <3 ng/dL — ABNORMAL LOW (ref 264–916)

## 2022-09-10 ENCOUNTER — Inpatient Hospital Stay: Payer: Medicaid Other | Attending: Hematology and Oncology | Admitting: Licensed Clinical Social Worker

## 2022-09-10 DIAGNOSIS — C7951 Secondary malignant neoplasm of bone: Secondary | ICD-10-CM

## 2022-09-10 NOTE — Progress Notes (Signed)
[place] CSW Progress Note  Holiday representative  received a call from pt inquiring about financial support.  Pt reports he was receiving disability until a couple of years ago when he was told he could receive early retirement.  Pt moved to [place] from [place] approximately 10 years ago.  According to pt he owed child support in [place] and when he was switched over to retirement his check started being garnished by over $300  each month to pay back the amount he is in arrears for child support.  Pt is now having a difficulty time paying his monthly bills.  CSW reviewed pt's chart.  It does not appear he ever applied for the [employer] when initially diagnosed and remains on targeted treatment which should make him eligible.  Contact information for financial resource specialist given to pt and message sent on behalf of pt to expedite application.  CSW sent an email on behalf of pt to legal aid to check if there is any legal recourse pt may have regarding the amount his check is garnished each month.  Pt also informed about the [employer] and [employer] for additional financial  assistance.  CSW to remain available as appropriate throughout duration of treatment.      Henriette Combs, LCSW    Patient is participating in a Managed Medicaid Plan:  Yes

## 2022-09-11 ENCOUNTER — Ambulatory Visit (HOSPITAL_COMMUNITY)
Admission: RE | Admit: 2022-09-11 | Discharge: 2022-09-11 | Disposition: A | Discharge status: Home / Self Care | Payer: Medicaid Other | Source: Ambulatory Visit | Attending: Cardiovascular Disease | Admitting: Cardiovascular Disease

## 2022-09-11 ENCOUNTER — Telehealth: Payer: Self-pay | Admitting: *Deleted

## 2022-09-11 DIAGNOSIS — E785 Hyperlipidemia, unspecified: Secondary | ICD-10-CM

## 2022-09-11 DIAGNOSIS — I10 Essential (primary) hypertension: Secondary | ICD-10-CM | POA: Diagnosis not present

## 2022-09-11 DIAGNOSIS — F17200 Nicotine dependence, unspecified, uncomplicated: Secondary | ICD-10-CM | POA: Diagnosis not present

## 2022-09-11 DIAGNOSIS — R0989 Other specified symptoms and signs involving the circulatory and respiratory systems: Secondary | ICD-10-CM | POA: Diagnosis not present

## 2022-09-11 NOTE — Telephone Encounter (Signed)
PC to patient, informed him he was mailed a calendar with his appointments which was returned for insufficient address.  Address corrected, apartment number added.  Reviewed patient's appointments - 11/20/22 @ 10:00 labs, PA visit & injection.  02/19/23 at 10:00 labs, PA visit, injection.  Patient verbalizes understanding.

## 2022-09-12 ENCOUNTER — Encounter: Payer: Self-pay | Admitting: Physician Assistant

## 2022-09-12 NOTE — Progress Notes (Signed)
Received call from patient referred by social worker to apply for one-time $1000 [employer]. Advised patient what is needed to apply. He will bring on 09/17/22 at 10am. He will provide to registration staff to be scanned and emailed to me. He will receive a grant packet upon approval. Advised to contact me at earliest convenience to discuss grant expense sheet in detail.He verbalized understanding.  He has my card for any additional financial questions or concerns.

## 2022-09-17 ENCOUNTER — Encounter: Payer: Self-pay | Admitting: Hematology and Oncology

## 2022-09-17 NOTE — Progress Notes (Signed)
Patient states he was told he didn't have an appointment til May. Patient was to provide documents and complete grant paperwork today.  He will return on Wed 3/27 to provide and complete.  He has my card for any additional financial questions or concerns.

## 2022-11-13 ENCOUNTER — Encounter: Payer: Self-pay | Admitting: Hematology and Oncology

## 2022-11-20 ENCOUNTER — Inpatient Hospital Stay: Payer: Medicare Other

## 2022-11-20 ENCOUNTER — Inpatient Hospital Stay (HOSPITAL_BASED_OUTPATIENT_CLINIC_OR_DEPARTMENT_OTHER): Payer: Medicare Other | Admitting: Physician Assistant

## 2022-11-20 ENCOUNTER — Inpatient Hospital Stay: Payer: Medicare Other | Attending: Hematology and Oncology

## 2022-11-20 ENCOUNTER — Other Ambulatory Visit: Payer: Self-pay | Admitting: Physician Assistant

## 2022-11-20 VITALS — BP 143/87 | HR 77 | Temp 98.7°F | Resp 19 | Wt 181.2 lb

## 2022-11-20 DIAGNOSIS — Z79899 Other long term (current) drug therapy: Secondary | ICD-10-CM | POA: Diagnosis not present

## 2022-11-20 DIAGNOSIS — Z5111 Encounter for antineoplastic chemotherapy: Secondary | ICD-10-CM | POA: Diagnosis not present

## 2022-11-20 DIAGNOSIS — C61 Malignant neoplasm of prostate: Secondary | ICD-10-CM

## 2022-11-20 DIAGNOSIS — C7951 Secondary malignant neoplasm of bone: Secondary | ICD-10-CM | POA: Insufficient documentation

## 2022-11-20 LAB — CMP (CANCER CENTER ONLY)
ALT: 17 U/L (ref 0–44)
AST: 19 U/L (ref 15–41)
Albumin: 4.4 g/dL (ref 3.5–5.0)
Alkaline Phosphatase: 65 U/L (ref 38–126)
Anion gap: 8 (ref 5–15)
BUN: 18 mg/dL (ref 8–23)
CO2: 24 mmol/L (ref 22–32)
Calcium: 9.6 mg/dL (ref 8.9–10.3)
Chloride: 106 mmol/L (ref 98–111)
Creatinine: 1.51 mg/dL — ABNORMAL HIGH (ref 0.61–1.24)
GFR, Estimated: 51 mL/min — ABNORMAL LOW (ref >60)
Glucose, Bld: 106 mg/dL — ABNORMAL HIGH (ref 70–99)
Potassium: 4.4 mmol/L (ref 3.5–5.1)
Sodium: 138 mmol/L (ref 135–145)
Total Bilirubin: 0.3 mg/dL (ref 0.3–1.2)
Total Protein: 7.9 g/dL (ref 6.5–8.1)

## 2022-11-20 LAB — CBC WITH DIFFERENTIAL (CANCER CENTER ONLY)
Abs Immature Granulocytes: 0 10*3/uL (ref 0.00–0.07)
Basophils Absolute: 0.1 10*3/uL (ref 0.0–0.1)
Basophils Relative: 1 %
Eosinophils Absolute: 0.1 10*3/uL (ref 0.0–0.5)
Eosinophils Relative: 3 %
HCT: 35.6 % — ABNORMAL LOW (ref 39.0–52.0)
Hemoglobin: 11.5 g/dL — ABNORMAL LOW (ref 13.0–17.0)
Immature Granulocytes: 0 %
Lymphocytes Relative: 39 %
Lymphs Abs: 2 10*3/uL (ref 0.7–4.0)
MCH: 27.5 pg (ref 26.0–34.0)
MCHC: 32.3 g/dL (ref 30.0–36.0)
MCV: 85.2 fL (ref 80.0–100.0)
Monocytes Absolute: 0.4 10*3/uL (ref 0.1–1.0)
Monocytes Relative: 8 %
Neutro Abs: 2.6 10*3/uL (ref 1.7–7.7)
Neutrophils Relative %: 49 %
Platelet Count: 193 10*3/uL (ref 150–400)
RBC: 4.18 MIL/uL — ABNORMAL LOW (ref 4.22–5.81)
RDW: 14.5 % (ref 11.5–15.5)
WBC Count: 5.2 10*3/uL (ref 4.0–10.5)
nRBC: 0 % (ref 0.0–0.2)

## 2022-11-20 MED ORDER — LEUPROLIDE ACETATE (3 MONTH) 22.5 MG ~~LOC~~ KIT
22.5000 mg | PACK | Freq: Once | SUBCUTANEOUS | Status: AC
  Administered 2022-11-20: 22.5 mg via SUBCUTANEOUS
  Filled 2022-11-20: qty 22.5

## 2022-11-20 NOTE — Progress Notes (Signed)
[facility] [phone]   [phone]  PROGRESS NOTE  Patient Care Team: Zorita Pang. 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 Seen by pulmonary with plans for PET and biopsy, though he was lost to follow up.  3) 01/17/2020: sent to the ED by Orthopedic surgery due to metastasis of the spine noted on MRI. IN the ED CT C/A/P showed extensive left pelvic and retroperitoneal lymphadenopathy. Mediastinal and bilateral hilar lymphadenopathy as well as mixed lytic and sclerotic lesions are noted in the bony pelvis 4) 01/17/2020: PSA noted to be 1074.94 5) 01/22/2020: establish care with Dr. Leonides Schanz. Started Bicalutamide 50mg  PO daily. Plan for 28 days of therapy.   6) 02/05/2020: Lupron 22.5mg  started.  7) 03/03/2020: received 45mg  lupron with urology. PSA 9.6 8) 03/30/2020: prostate biopsy performed, confirmed adenocarcinoma of the prostate.  9) 04/25/2020: stopped Zytiga therapy after < 7 days due to worsening depression 10) 04/28/2020: received enzalutamide 160mg , did not take any doses 11) 06/15/2020: agreed to restart Zytiga 1000mg  daily with prednisone 5mg   12) 09/08/2020: Lupron 22.5mg  shot.  13) 12/02/2020: Lupron 22.5mg  shot.  14) 03/01/2021: Lupron 22.5mg  shot. 15) 05/24/2021: Lupron 22.5mg  shot. 16) 08/16/2021: Lupron 22.5mg  shot. 17) 11/15/2021: Lupron 22.5 mg shot. Patient reports he is not taking zytiga as prescribed.   Interval History:  [name] [name] 67 y.o. male with medical history significant for metastatic prostate cancer who presents  for a follow up visit. The patient's last visit was on 08/21/2022. In the interim since the last visit Mr. [name] [name] has continued on lupron therapy.   On exam today Mr. [name] [name] reports he is doing well without any new or concerning symptoms. His appetite is stable and he denies any weight changes. His energy level is stable and he can complete his ADLs on his own. He denies nausea, vomiting or abdominal pain. He denies any bowel habit changes. He denies fevers, chills, sweats, shortness of breath, chest pain or cough. He has no other complaints. Full 10 point ROS is listed below.  MEDICAL HISTORY:  Past Medical History:  Diagnosis Date   Brain tumor [facility])    diagnosed by MRI 6 months ago   Cancer [facility])    Pt reports brain tumor 2 years ago   Coronary artery disease 2011   s/p BMS stent LAD, [facility], [place] [place], Dr Kathie Rhodes. [name]   Depression    Dyspnea    High cholesterol    History of herniated intervertebral disc    HTN (hypertension)    Hyperlipidemia LDL goal <70 07/21/2013   Lung nodule    NSTEMI (non-ST elevated myocardial infarction) (HCC) 11/11/2017   Smoker    1/2 ppd x >40 years     SURGICAL HISTORY: Past Surgical History:  Procedure Laterality Date   CORONARY ANGIOPLASTY WITH STENT PLACEMENT Left 2011   BMS LAD, [facility], [facility] [place], [place], Dr Kathie Rhodes. [name]   CORONARY STENT INTERVENTION N/A 11/12/2017   Procedure: CORONARY STENT INTERVENTION;  Surgeon: Marykay Lex, MD;  Location: [facility] INVASIVE CV LAB;  Service: Cardiovascular;  Laterality: N/A;   LEFT HEART CATH AND CORONARY ANGIOGRAPHY  N/A 11/12/2017   Procedure: LEFT HEART CATH AND CORONARY ANGIOGRAPHY;  Surgeon: Marykay Lex, MD;  Location: [facility] INVASIVE CV LAB;  Service: Cardiovascular;  Laterality: N/A;    SOCIAL HISTORY: Social History   Socioeconomic History   Marital status: Single    Spouse name: Not on file   Number of children: Not on file   Years of education: Not on file    Highest education level: Not on file  Occupational History   Occupation: unemployed  Tobacco Use   Smoking status: Every Day    Packs/day: 0.25    Years: 45.00    Additional pack years: 0.00    Total pack years: 11.25    Types: Cigarettes   Smokeless tobacco: Never  Vaping Use   Vaping Use: Never used  Substance and Sexual Activity   Alcohol use: Yes    Alcohol/week: 75.0 standard drinks of alcohol    Types: 75 Shots of liquor per week    Comment: 2 shots of liquor daily   Drug use: Not Currently    Types: Marijuana    Comment: occasionally   Sexual activity: Yes  Other Topics Concern   Not on file  Social History Narrative   From [place]    Family lives in [place] (brother, sister and mother)    Unemployed    Drinks 3 (1/5)ths in 1 week   Social Determinants of Health   Financial Resource Strain: Low Risk  (01/22/2020)   Overall Financial Resource Strain (CARDIA)    Difficulty of Paying Living Expenses: Not very hard  Food Insecurity: No Food Insecurity (01/22/2020)   Hunger Vital Sign    Worried About Running Out of Food in the Last Year: Never true    Ran Out of Food in the Last Year: Never true  Transportation Needs: Unmet Transportation Needs (12/14/2021)   PRAPARE - Administrator, Civil Service (Medical): Not on file    Lack of Transportation (Non-Medical): Yes  Physical Activity: Inactive (01/22/2020)   Exercise Vital Sign    Days of Exercise per Week: 0 days    Minutes of Exercise per Session: 0 min  Stress: Stress Concern Present (01/22/2020)   [employer] of Occupational Health - Occupational Stress Questionnaire    Feeling of Stress : To some extent  Social Connections: Socially Isolated (01/22/2020)   Social Connection and Isolation Panel [NHANES]    Frequency of Communication with Friends and Family: Three times a week    Frequency of Social Gatherings with Friends and Family: Three times a week    Attends Religious Services: Never     Active Member of Clubs or Organizations: No    Attends Banker Meetings: Never    Marital Status: Never married  Catering manager Violence: Not on file    FAMILY HISTORY: Family History  Problem Relation Age of Onset   Diabetes Paternal Grandfather    Hypertension Paternal Grandfather     ALLERGIES:  has No Known Allergies.  MEDICATIONS:  Current Outpatient Medications  Medication Sig Dispense Refill   aspirin 81 MG EC tablet Take 1 tablet (81 mg total) by mouth daily. 90 tablet 3   atorvastatin (LIPITOR) 80 MG tablet Take 1 tablet (80 mg total) by mouth daily. 90 tablet 3   calcium-vitamin D (OSCAL WITH D) 500-200 MG-UNIT tablet Take 1 tablet by mouth daily with breakfast. 90 tablet 3   carvedilol (COREG) 6.25 MG tablet Take 1 tablet (6.25 mg total)  by mouth 2 (two) times daily with a meal. 180 tablet 3   clopidogrel (PLAVIX) 75 MG tablet Take 1 tablet (75 mg total) by mouth daily. 90 tablet 3   enzalutamide (XTANDI) 40 MG capsule Take 4 capsules (160 mg total) by mouth daily. 120 capsule 3   lisinopril (ZESTRIL) 10 MG tablet Take 1 tablet (10 mg total) by mouth daily. 90 tablet 3   nitroGLYCERIN (NITROSTAT) 0.4 MG SL tablet Place 1 tablet (0.4 mg total) under the tongue every 5 (five) minutes as needed for chest pain. 25 tablet 1   predniSONE (DELTASONE) 5 MG tablet TAKE 1 TABLET (5 MG TOTAL) BY MOUTH DAILY WITH BREAKFAST. 90 tablet 1   No current facility-administered medications for this visit.    REVIEW OF SYSTEMS:   Constitutional: ( - ) fevers, ( - )  chills , ( - ) night sweats Eyes: ( - ) blurriness of vision, ( - ) double vision, ( - ) watery eyes Ears, nose, mouth, throat, and face: ( - ) mucositis, ( - ) sore throat Respiratory: ( - ) cough, ( - ) dyspnea, ( - ) wheezes Cardiovascular: ( - ) palpitation, ( - ) chest discomfort, ( - ) lower extremity swelling Gastrointestinal:  ( - ) nausea, ( - ) heartburn, ( - ) change in bowel habits Skin: ( - )  abnormal skin rashes Lymphatics: ( - ) new lymphadenopathy, ( - ) easy bruising Neurological: ( - ) numbness, ( - ) tingling, ( - ) new weaknesses Behavioral/Psych: ( - ) mood change, ( - ) new changes  All other systems were reviewed with the patient and are negative.  PHYSICAL EXAMINATION: ECOG PERFORMANCE STATUS: 1 - Symptomatic but completely ambulatory  Vitals:   11/20/22 1020  BP: (!) 143/87  Pulse: 77  Resp: 19  Temp: 98.7 F (37.1 C)  SpO2: 100%    Filed Weights   11/20/22 1020  Weight: 181 lb 3.2 oz (82.2 kg)     GENERAL:  Well appearing middle aged [place] American male. alert, no distress and comfortable SKIN: skin color, texture, turgor are normal, no rashes or significant lesions EYES: conjunctiva are pink and non-injected, sclera clear LUNGS: clear to auscultation and percussion with normal breathing effort HEART: regular rate & rhythm and no murmurs and no lower extremity edema Musculoskeletal: no cyanosis of digits and no clubbing  PSYCH: alert & oriented x 3, fluent speech NEURO: no focal motor/sensory deficits  LABORATORY DATA:  I have reviewed the data as listed    Latest Ref Rng & Units 11/20/2022   10:04 AM 08/21/2022    9:23 AM 05/21/2022    9:28 AM  CBC  WBC 4.0 - 10.5 K/uL 5.2  7.9  7.7   Hemoglobin 13.0 - 17.0 g/dL 16.1  09.6  04.5   Hematocrit 39.0 - 52.0 % 35.6  36.0  36.4   Platelets 150 - 400 K/uL 193  203  183        Latest Ref Rng & Units 08/21/2022    9:23 AM 07/04/2022   10:36 AM 05/21/2022    9:28 AM  CMP  Glucose 70 - 99 mg/dL 409   811   BUN 8 - 23 mg/dL 18   26   Creatinine 9.14 - 1.24 mg/dL 7.82  9.56  2.13   Sodium 135 - 145 mmol/L 139   140   Potassium 3.5 - 5.1 mmol/L 4.1   4.2   Chloride 98 - 111  mmol/L 105   107   CO2 22 - 32 mmol/L 25   27   Calcium 8.9 - 10.3 mg/dL 9.4   09.8   Total Protein 6.5 - 8.1 g/dL 8.1   8.1   Total Bilirubin 0.3 - 1.2 mg/dL 0.3   0.3   Alkaline Phos 38 - 126 U/L 78   76   AST 15 - 41  U/L 15   17   ALT 0 - 44 U/L 14   15    RADIOGRAPHIC STUDIES: No results found.  ASSESSMENT & PLAN [name] [name] is a 67 y.o. male with medical history significant for metastatic prostate cancer who presents for a follow up visit.    # Metastatic Castrate Sensitive Prostate Cancer  --prostate biopsy performed by urology on 03/30/2020, confirms diagnosis of prostate adenocarcinoma. Gleason score not calculated due to ADT therapy  --the literature does not currently support the use of bisphosphonates in the setting of CSPC.  --CT C/A/P Scan and NM bone scan last performed in June 2023. Noted to have stable disease. Continue with imaging q 6 months.  ----Patient does not want to take abiraterone or enzalutamide 160 mg p.o. daily. Plan: --continue calcium/vitamin D for bone protection on ADT therapy.  --Receive dose of lupron 22.5 mg today. Next shot due in August 2024 --testosterone at <3 at last check, PSA 1.2 on 08/21/2022 (up from 1.0 on 05/21/2022) --labs today show white blood cell 5.2,  hemoglobin 11.5, MCV 85.2, and platelets of 193. Creatinine stable at 1.51. LFTs normal.  --next set of scans will be due before next visit.  --have patient RTC 3 months   # Depression --No longer taking citalopram 20 mg p.o. daily --He plans to re-establish care with [facility].   # Symptom management -- oxycodone 5mg  q6H PRN for pain.  --encourage OTC senna for constipation prevention if taking opioids.   # Goals of Care --discussed the incurable nature of metastatic disease --emphasized that all treatments moving forward are focused on symptom control, decreasing tumor size/slow spread, and increasing lifespan.  --patient voiced understanding of the palliative nature of treatments moving forward.  --patients MPOA is Chanel Terie Purser (his daughter)   No orders of the defined types were placed in this encounter.  All questions were answered. The patient knows to call the clinic with any problems,  questions or concerns.  I have spent a total of 30 minutes minutes of face-to-face and non-face-to-face time, preparing to see the patient, performing a medically appropriate examination, counseling and educating the patient, documenting clinical information in the electronic health record, and care coordination.   Georga Kaufmann PA-C Dept of Hematology and Oncology [facility] Phone: [phone]   11/20/2022 10:24 AM   Literature Support:  Danna Hefty A Systematic Review and Meta-Analysis about the Effect of Bisphosphonates on the Risk of Skeletal-Related Event in Men with Prostate Cancer. Anticancer Agents Med Chem. 2020;20(13):1604-1612.  --Our study demonstrated that bisphosphonates could not statistically significantly reduce the risk of SRE in patients with prostate cancer, neither in the subgroups with M1 or CSPC.

## 2022-11-21 LAB — TESTOSTERONE: Testosterone: 8 ng/dL — ABNORMAL LOW (ref 264–916)

## 2022-11-21 LAB — PROSTATE-SPECIFIC AG, SERUM (LABCORP): Prostate Specific Ag, Serum: 2.1 ng/mL (ref 0.0–4.0)

## 2022-11-29 ENCOUNTER — Telehealth: Payer: Self-pay

## 2022-11-29 NOTE — Telephone Encounter (Signed)
Pt advised with VU 

## 2022-11-29 NOTE — Telephone Encounter (Signed)
-----   Message from Briant Cedar, PA-C sent at 11/29/2022  1:21 PM EDT ----- Please notify patient that PSA level did increase mildly but still within normal limits. Continue on lupron for now.    ----- Message ----- From: Interface, Lab In [place] Sent: 11/20/2022  10:15 AM EDT To: Briant Cedar, PA-C

## 2023-02-17 ENCOUNTER — Other Ambulatory Visit: Payer: Self-pay | Admitting: Physician Assistant

## 2023-02-17 DIAGNOSIS — C61 Malignant neoplasm of prostate: Secondary | ICD-10-CM

## 2023-02-18 ENCOUNTER — Encounter (HOSPITAL_COMMUNITY)
Admission: RE | Admit: 2023-02-18 | Discharge: 2023-02-18 | Disposition: A | Discharge status: Home / Self Care | Payer: Medicare Other | Source: Ambulatory Visit | Attending: Physician Assistant | Admitting: Physician Assistant

## 2023-02-18 ENCOUNTER — Encounter: Payer: Self-pay | Admitting: Hematology and Oncology

## 2023-02-18 ENCOUNTER — Ambulatory Visit (HOSPITAL_COMMUNITY)
Admission: RE | Admit: 2023-02-18 | Discharge: 2023-02-18 | Disposition: A | Discharge status: Home / Self Care | Payer: Medicare Other | Source: Ambulatory Visit | Attending: Physician Assistant | Admitting: Physician Assistant

## 2023-02-18 DIAGNOSIS — C61 Malignant neoplasm of prostate: Secondary | ICD-10-CM | POA: Diagnosis present

## 2023-02-18 DIAGNOSIS — I10 Essential (primary) hypertension: Secondary | ICD-10-CM | POA: Insufficient documentation

## 2023-02-18 DIAGNOSIS — C7951 Secondary malignant neoplasm of bone: Secondary | ICD-10-CM | POA: Insufficient documentation

## 2023-02-18 LAB — POCT I-STAT CREATININE: Creatinine, Ser: 1.5 mg/dL — ABNORMAL HIGH (ref 0.61–1.24)

## 2023-02-18 MED ORDER — IOHEXOL 300 MG/ML  SOLN
100.0000 mL | Freq: Once | INTRAMUSCULAR | Status: AC | PRN
  Administered 2023-02-18: 80 mL via INTRAVENOUS

## 2023-02-18 MED ORDER — TECHNETIUM TC 99M MEDRONATE IV KIT
20.0000 | PACK | Freq: Once | INTRAVENOUS | Status: AC | PRN
  Administered 2023-02-18: 17.9 via INTRAVENOUS

## 2023-02-18 MED ORDER — IOHEXOL 9 MG/ML PO SOLN
1000.0000 mL | ORAL | Status: AC
  Administered 2023-02-18: 1000 mL via ORAL

## 2023-02-19 ENCOUNTER — Inpatient Hospital Stay: Payer: Medicare Other | Admitting: Physician Assistant

## 2023-02-19 ENCOUNTER — Inpatient Hospital Stay: Payer: Medicare Other | Attending: Hematology and Oncology

## 2023-02-19 ENCOUNTER — Inpatient Hospital Stay: Payer: Medicare Other

## 2023-02-19 VITALS — BP 149/88 | HR 71 | Temp 97.9°F | Resp 18 | Wt 180.6 lb

## 2023-02-19 DIAGNOSIS — C7951 Secondary malignant neoplasm of bone: Secondary | ICD-10-CM | POA: Diagnosis not present

## 2023-02-19 DIAGNOSIS — C61 Malignant neoplasm of prostate: Secondary | ICD-10-CM | POA: Diagnosis not present

## 2023-02-19 DIAGNOSIS — Z79899 Other long term (current) drug therapy: Secondary | ICD-10-CM | POA: Diagnosis not present

## 2023-02-19 DIAGNOSIS — Z5111 Encounter for antineoplastic chemotherapy: Secondary | ICD-10-CM | POA: Insufficient documentation

## 2023-02-19 LAB — CBC WITH DIFFERENTIAL (CANCER CENTER ONLY)
Abs Immature Granulocytes: 0.01 10*3/uL (ref 0.00–0.07)
Basophils Absolute: 0 10*3/uL (ref 0.0–0.1)
Basophils Relative: 1 %
Eosinophils Absolute: 0.1 10*3/uL (ref 0.0–0.5)
Eosinophils Relative: 1 %
HCT: 34.8 % — ABNORMAL LOW (ref 39.0–52.0)
Hemoglobin: 11.2 g/dL — ABNORMAL LOW (ref 13.0–17.0)
Immature Granulocytes: 0 %
Lymphocytes Relative: 32 %
Lymphs Abs: 2.2 10*3/uL (ref 0.7–4.0)
MCH: 27.6 pg (ref 26.0–34.0)
MCHC: 32.2 g/dL (ref 30.0–36.0)
MCV: 85.7 fL (ref 80.0–100.0)
Monocytes Absolute: 0.5 10*3/uL (ref 0.1–1.0)
Monocytes Relative: 7 %
Neutro Abs: 4.2 10*3/uL (ref 1.7–7.7)
Neutrophils Relative %: 59 %
Platelet Count: 189 10*3/uL (ref 150–400)
RBC: 4.06 MIL/uL — ABNORMAL LOW (ref 4.22–5.81)
RDW: 14.7 % (ref 11.5–15.5)
WBC Count: 7 10*3/uL (ref 4.0–10.5)
nRBC: 0 % (ref 0.0–0.2)

## 2023-02-19 LAB — CMP (CANCER CENTER ONLY)
ALT: 22 U/L (ref 0–44)
AST: 21 U/L (ref 15–41)
Albumin: 4.4 g/dL (ref 3.5–5.0)
Alkaline Phosphatase: 69 U/L (ref 38–126)
Anion gap: 8 (ref 5–15)
BUN: 21 mg/dL (ref 8–23)
CO2: 25 mmol/L (ref 22–32)
Calcium: 9.7 mg/dL (ref 8.9–10.3)
Chloride: 107 mmol/L (ref 98–111)
Creatinine: 1.42 mg/dL — ABNORMAL HIGH (ref 0.61–1.24)
GFR, Estimated: 55 mL/min — ABNORMAL LOW (ref >60)
Glucose, Bld: 140 mg/dL — ABNORMAL HIGH (ref 70–99)
Potassium: 4.1 mmol/L (ref 3.5–5.1)
Sodium: 140 mmol/L (ref 135–145)
Total Bilirubin: 0.3 mg/dL (ref 0.3–1.2)
Total Protein: 7.8 g/dL (ref 6.5–8.1)

## 2023-02-19 MED ORDER — LEUPROLIDE ACETATE (3 MONTH) 22.5 MG ~~LOC~~ KIT
22.5000 mg | PACK | Freq: Once | SUBCUTANEOUS | Status: AC
  Administered 2023-02-19: 22.5 mg via SUBCUTANEOUS
  Filled 2023-02-19: qty 22.5

## 2023-02-19 NOTE — Progress Notes (Signed)
[facility] [phone]   [phone]  PROGRESS NOTE  Patient Care Team: Zorita Pang. 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 Seen by pulmonary with plans for PET and biopsy, though he was lost to follow up.  3) 01/17/2020: sent to the ED by Orthopedic surgery due to metastasis of the spine noted on MRI. IN the ED CT C/A/P showed extensive left pelvic and retroperitoneal lymphadenopathy. Mediastinal and bilateral hilar lymphadenopathy as well as mixed lytic and sclerotic lesions are noted in the bony pelvis 4) 01/17/2020: PSA noted to be 1074.94 5) 01/22/2020: establish care with Dr. Leonides Schanz. Started Bicalutamide 50mg  PO daily. Plan for 28 days of therapy.   6) 02/05/2020: Lupron 22.5mg  started.  7) 03/03/2020: received 45mg  lupron with urology. PSA 9.6 8) 03/30/2020: prostate biopsy performed, confirmed adenocarcinoma of the prostate.  9) 04/25/2020: stopped Zytiga therapy after < 7 days due to worsening depression 10) 04/28/2020: received enzalutamide 160mg , did not take any doses 11) 06/15/2020: agreed to restart Zytiga 1000mg  daily with prednisone 5mg   12) 09/08/2020: Lupron 22.5mg  shot.  13) 12/02/2020: Lupron 22.5mg  shot.  14) 03/01/2021: Lupron 22.5mg  shot. 15) 05/24/2021: Lupron 22.5mg  shot. 16) 08/16/2021: Lupron 22.5mg  shot. 17) 11/15/2021: Lupron 22.5 mg shot. Patient reports he is not taking zytiga as prescribed.  18) 02/16/2022: Lupron 22.5mg  shot. 19) 05/21/2022: Lupron 22.5mg  shot. 20) 08/21/2022: Lupron 22.5mg  shot. 21) 11/20/2022: Lupron  22.5mg  shot.  Interval History:  [name] [name] 67 y.o. male with medical history significant for metastatic prostate cancer who presents for a follow up visit. The patient's last visit was on 11/20/2022. In the interim since the last visit [name] [name] has continued on lupron therapy.   On exam today [name] [name] reports he is doing well without any new or concerning symptoms.  He reports appetite and energy are stable.  He denies denies any back or bone pain.  He denies nausea, vomiting or bowel changes.  He denies easy bruising or signs of active bleeding. He denies fevers, chills, sweats, shortness of breath, chest pain or cough. He has no other complaints. Full 10 point ROS is listed below.  MEDICAL HISTORY:  Past Medical History:  Diagnosis Date   Brain tumor [facility])    diagnosed by MRI 6 months ago   Cancer [facility])    Pt reports brain tumor 2 years ago   Coronary artery disease 2011   s/p BMS stent LAD, [facility] [place], [place], Dr Kathie Rhodes. [name]   Depression    Dyspnea    High cholesterol    History of herniated intervertebral disc    HTN (hypertension)    Hyperlipidemia LDL goal <70 07/21/2013   Lung nodule    NSTEMI (non-ST elevated myocardial infarction) (HCC) 11/11/2017   Smoker    1/2 ppd x >40 years     SURGICAL HISTORY: Past Surgical History:  Procedure Laterality Date   CORONARY ANGIOPLASTY WITH STENT PLACEMENT Left 2011   BMS LAD, [facility], [place], [place], Dr Kathie Rhodes. [name]   CORONARY STENT INTERVENTION N/A 11/12/2017   Procedure: CORONARY STENT INTERVENTION;  Surgeon: Marykay Lex, MD;  Location:  [facility] INVASIVE CV LAB;  Service: Cardiovascular;  Laterality: N/A;   LEFT HEART CATH AND CORONARY ANGIOGRAPHY N/A 11/12/2017   Procedure: LEFT HEART CATH AND CORONARY ANGIOGRAPHY;  Surgeon: Marykay Lex, MD;  Location: [facility] INVASIVE CV LAB;  Service: Cardiovascular;  Laterality: N/A;    SOCIAL HISTORY: Social History   Socioeconomic History    Marital status: Single    Spouse name: Not on file   Number of children: Not on file   Years of education: Not on file   Highest education level: Not on file  Occupational History   Occupation: unemployed  Tobacco Use   Smoking status: Every Day    Current packs/day: 0.25    Average packs/day: 0.3 packs/day for 45.0 years (11.3 ttl pk-yrs)    Types: Cigarettes   Smokeless tobacco: Never  Vaping Use   Vaping status: Never Used  Substance and Sexual Activity   Alcohol use: Yes    Alcohol/week: 75.0 standard drinks of alcohol    Types: 75 Shots of liquor per week    Comment: 2 shots of liquor daily   Drug use: Not Currently    Types: Marijuana    Comment: occasionally   Sexual activity: Yes  Other Topics Concern   Not on file  Social History Narrative   From [place]    Family lives in [place] (brother, sister and mother)    Unemployed    Drinks 3 (1/5)ths in 1 week   Social Determinants of Health   Financial Resource Strain: Not on File (11/16/2021)   Received from [employer] Resource Strain: 0  Recent Concern: Physicist, medical Strain - At Risk (11/16/2021)   Received from [employer]    Financial Resource Strain: 2  Food Insecurity: Not on File (11/16/2021)   Received from [employer]    Food: 0  Recent Concern: Food Insecurity - At Risk (11/16/2021)   Received from [employer] Insecurity    Food: 2  Transportation Needs: Unmet Transportation Needs (12/14/2021)   PRAPARE - Administrator, Civil Service (Medical): Not on file    Lack of Transportation (Non-Medical): Yes  Physical Activity: Not on File (10/12/2021)   Received from [place], [place]   Physical Activity    Physical Activity: 0  Stress: Not on File (10/12/2021)   Received from [facility], [place]   Stress    Stress: 0  Social Connections: Not on File (10/12/2021)   Received from [place], [place]   Social Connections    Social  Connections and Isolation: 0  Intimate Partner Violence: Not on file    FAMILY HISTORY: Family History  Problem Relation Age of Onset   Diabetes Paternal Grandfather    Hypertension Paternal Grandfather     ALLERGIES:  has No Known Allergies.  MEDICATIONS:  Current Outpatient Medications  Medication Sig Dispense Refill   atorvastatin (LIPITOR) 80 MG tablet Take 1 tablet (80 mg total) by mouth daily. 90 tablet 3   aspirin 81 MG EC tablet Take 1 tablet (81 mg total) by mouth daily. (Patient not taking: Reported on 02/19/2023) 90 tablet 3   calcium-vitamin D (OSCAL WITH D) 500-200 MG-UNIT tablet Take 1 tablet by mouth daily with breakfast. (Patient not taking: Reported on 02/19/2023) 90 tablet 3   carvedilol (COREG) 6.25 MG tablet Take 1 tablet (6.25 mg total) by mouth 2 (two) times daily with a meal. (Patient  not taking: Reported on 02/19/2023) 180 tablet 3   clopidogrel (PLAVIX) 75 MG tablet Take 1 tablet (75 mg total) by mouth daily. (Patient not taking: Reported on 02/19/2023) 90 tablet 3   enzalutamide (XTANDI) 40 MG capsule Take 4 capsules (160 mg total) by mouth daily. (Patient not taking: Reported on 02/19/2023) 120 capsule 3   lisinopril (ZESTRIL) 10 MG tablet Take 1 tablet (10 mg total) by mouth daily. (Patient not taking: Reported on 02/19/2023) 90 tablet 3   nitroGLYCERIN (NITROSTAT) 0.4 MG SL tablet Place 1 tablet (0.4 mg total) under the tongue every 5 (five) minutes as needed for chest pain. (Patient not taking: Reported on 02/19/2023) 25 tablet 1   No current facility-administered medications for this visit.    REVIEW OF SYSTEMS:   Constitutional: ( - ) fevers, ( - )  chills , ( - ) night sweats Eyes: ( - ) blurriness of vision, ( - ) double vision, ( - ) watery eyes Ears, nose, mouth, throat, and face: ( - ) mucositis, ( - ) sore throat Respiratory: ( - ) cough, ( - ) dyspnea, ( - ) wheezes Cardiovascular: ( - ) palpitation, ( - ) chest discomfort, ( - ) lower extremity  swelling Gastrointestinal:  ( - ) nausea, ( - ) heartburn, ( - ) change in bowel habits Skin: ( - ) abnormal skin rashes Lymphatics: ( - ) new lymphadenopathy, ( - ) easy bruising Neurological: ( - ) numbness, ( - ) tingling, ( - ) new weaknesses Behavioral/Psych: ( - ) mood change, ( - ) new changes  All other systems were reviewed with the patient and are negative.  PHYSICAL EXAMINATION: ECOG PERFORMANCE STATUS: 1 - Symptomatic but completely ambulatory  Vitals:   02/19/23 1046  BP: (!) 149/88  Pulse: 71  Resp: 18  Temp: 97.9 F (36.6 C)  SpO2: 100%    Filed Weights   02/19/23 1046  Weight: 180 lb 9.6 oz (81.9 kg)     GENERAL:  Well appearing middle aged [place] American male. alert, no distress and comfortable SKIN: skin color, texture, turgor are normal, no rashes or significant lesions EYES: conjunctiva are pink and non-injected, sclera clear LUNGS: clear to auscultation and percussion with normal breathing effort HEART: regular rate & rhythm and no murmurs and no lower extremity edema Musculoskeletal: no cyanosis of digits and no clubbing  PSYCH: alert & oriented x 3, fluent speech NEURO: no focal motor/sensory deficits  LABORATORY DATA:  I have reviewed the data as listed    Latest Ref Rng & Units 02/19/2023   10:00 AM 11/20/2022   10:04 AM 08/21/2022    9:23 AM  CBC  WBC 4.0 - 10.5 K/uL 7.0  5.2  7.9   Hemoglobin 13.0 - 17.0 g/dL 47.8  29.5  62.1   Hematocrit 39.0 - 52.0 % 34.8  35.6  36.0   Platelets 150 - 400 K/uL 189  193  203        Latest Ref Rng & Units 02/19/2023   10:00 AM 02/18/2023    9:31 AM 11/20/2022   10:04 AM  CMP  Glucose 70 - 99 mg/dL 308   657   BUN 8 - 23 mg/dL 21   18   Creatinine 8.46 - 1.24 mg/dL 9.62  9.52  8.41   Sodium 135 - 145 mmol/L 140   138   Potassium 3.5 - 5.1 mmol/L 4.1   4.4   Chloride 98 - 111 mmol/L 107  106   CO2 22 - 32 mmol/L 25   24   Calcium 8.9 - 10.3 mg/dL 9.7   9.6   Total Protein 6.5 - 8.1 g/dL 7.8   7.9    Total Bilirubin 0.3 - 1.2 mg/dL 0.3   0.3   Alkaline Phos 38 - 126 U/L 69   65   AST 15 - 41 U/L 21   19   ALT 0 - 44 U/L 22   17    RADIOGRAPHIC STUDIES: CT CHEST ABDOMEN PELVIS W CONTRAST  Result Date: 02/18/2023 CLINICAL DATA:  Metastatic prostate cancer. Assess treatment response. * Tracking Code: BO * EXAM: CT CHEST, ABDOMEN, AND PELVIS WITH CONTRAST TECHNIQUE: Multidetector CT imaging of the chest, abdomen and pelvis was performed following the standard protocol during bolus administration of intravenous contrast. RADIATION DOSE REDUCTION: This exam was performed according to the departmental dose-optimization program which includes automated exposure control, adjustment of the mA and/or kV according to patient size and/or use of iterative reconstruction technique. CONTRAST:  80mL OMNIPAQUE IOHEXOL 300 MG/ML  SOLN COMPARISON:  CT of the chest, abdomen and pelvis 07/04/2022 and 12/22/2021. Bone scan 12/20/2021. FINDINGS: CT CHEST FINDINGS Cardiovascular: No acute vascular findings. Diffuse atherosclerosis of the aorta, great vessels and coronary arteries again noted with probable coronary artery stents and irregular ulcerated plaque in the descending thoracic aorta. Stable mild ostial stenosis of the right brachiocephalic artery. The left vertebral artery arises directly from the aortic arch. The heart size is normal. There is no pericardial effusion. Mediastinum/Nodes: There are no enlarged mediastinal, hilar or axillary lymph nodes. The thyroid gland, trachea and esophagus demonstrate no significant findings. Lungs/Pleura: No pleural effusion or pneumothorax. Unchanged small subpleural and perifissural nodules bilaterally consistent with intrapulmonary lymph nodes based on stability. No new, enlarging or morphologic the suspicious nodules. No confluent airspace disease. Musculoskeletal/Chest wall: No chest wall mass or suspicious osseous findings. CT ABDOMEN AND PELVIS FINDINGS Hepatobiliary: The  liver is normal in density without suspicious focal abnormality. Multiple hepatic cysts are unchanged. No evidence of gallstones, gallbladder wall thickening or biliary dilatation. Pancreas: Unremarkable. No pancreatic ductal dilatation or surrounding inflammatory changes. Spleen: Normal in size without focal abnormality. Adrenals/Urinary Tract: Both adrenal glands appear normal. No evidence of urinary tract calculus, suspicious renal lesion or hydronephrosis. Stable chronic left renal atrophy with cortical scarring and perinephric soft tissue stranding. The bladder appears unremarkable for its degree of distention. Stomach/Bowel: No enteric contrast administered. The stomach appears unremarkable for its degree of distension. No evidence of bowel wall thickening, distention or surrounding inflammatory change. The appendix appears normal. Mildly prominent stool in the colon. Vascular/Lymphatic: Scattered prominent retroperitoneal and pelvic lymph nodes are unchanged, including a 7 mm short axis left external iliac node on image 113/2. No evidence of progressive adenopathy. No acute vascular findings. Aortic and branch vessel atherosclerosis with stable mild dilatation of the infrarenal abdominal aorta to 2.8 cm. No focal aneurysm. Stable mild dilatation of the common iliac arteries bilaterally. Reproductive: Stable appearance of the prostate gland which is mildly enlarged and protrudes into the bladder base. Other: No evidence of abdominal wall mass or hernia. No ascites or pneumoperitoneum. Musculoskeletal: No acute or significant osseous findings. Stable scattered sclerotic lesions, most prominent along the right aspect of the sacroiliac joint which may reflect treated osseous metastatic disease. No new lesions or pathologic fractures. Chronic degenerative disc disease at L5-S1. Mild hip degenerative changes bilaterally. Unless specific follow-up recommendations are mentioned in the findings or impression sections,  no imaging follow-up of any mentioned incidental findings is recommended. IMPRESSION: 1. Stable CTS of the chest, abdomen and pelvis. No evidence of local recurrence or progressive metastatic disease. 2. Stable scattered sclerotic osseous lesions which may reflect treated osseous metastatic disease. 3. Stable small retroperitoneal and pelvic lymph nodes. 4. Stable chronic left renal atrophy with cortical scarring. 5. Stable mild dilatation of the infrarenal abdominal aorta and common iliac arteries bilaterally. 6.  Aortic Atherosclerosis (ICD10-I70.0). Electronically Signed   By: Carey Bullocks M.D.   On: 02/18/2023 16:48   NM Bone Scan Whole Body  Result Date: 02/18/2023 CLINICAL DATA:  Prostate cancer. EXAM: NUCLEAR MEDICINE WHOLE BODY BONE SCAN TECHNIQUE: Whole body anterior and posterior images were obtained approximately 3 hours after intravenous injection of radiopharmaceutical. RADIOPHARMACEUTICALS:  17.9 mCi Technetium-52m MDP IV COMPARISON:  12/20/2021 FINDINGS: No unexpected radiotracer accumulation within the visualized bony anatomy to suggest metastatic disease. IMPRESSION: No findings to suggest bony metastatic disease. Electronically Signed   By: Kennith Center M.D.   On: 02/18/2023 16:30    ASSESSMENT & PLAN [name] [name] is a 66 y.o. male with medical history significant for metastatic prostate cancer who presents for a follow up visit.    # Metastatic Castrate Sensitive Prostate Cancer  --prostate biopsy performed by urology on 03/30/2020, confirms diagnosis of prostate adenocarcinoma. Gleason score not calculated due to ADT therapy  --the literature does not currently support the use of bisphosphonates in the setting of CSPC.  --CT C/A/P Scan and NM bone scan last performed in June 2023. Noted to have stable disease. Continue with imaging q 6 months.  ----Patient does not want to take abiraterone or enzalutamide 160 mg p.o. daily. Plan: --continue calcium/vitamin D for bone  protection on ADT therapy.  --Due for next dose of lupron 22.5 mg today. Next shot due in November 2024 --testosterone at <3 at last check, PSA increased to 2.1 on 11/20/2022 (up from 1.2 on 08/21/2022) --labs today show white blood cell 7.0,  hemoglobin 11.2, MCV 85.7, and platelets of 189. Creatinine stable at 1.42. LFTs normal.  --Repeat CT CAP and Bone scan show stable disease with no evidence of new or progressive disease.  --RTC 3 months with labs, follow up and next Lupron injection  # Depression --No longer taking citalopram 20 mg p.o. daily --He plans to re-establish care with [facility].   # Symptom management -- oxycodone 5mg  q6H PRN for pain.  --encourage OTC senna for constipation prevention if taking opioids.   # Goals of Care --discussed the incurable nature of metastatic disease --emphasized that all treatments moving forward are focused on symptom control, decreasing tumor size/slow spread, and increasing lifespan.  --patient voiced understanding of the palliative nature of treatments moving forward.  --patients MPOA is Chanel Terie Purser (his daughter)   No orders of the defined types were placed in this encounter.  All questions were answered. The patient knows to call the clinic with any problems, questions or concerns.  I have spent a total of 30 minutes minutes of face-to-face and non-face-to-face time, preparing to see the patient, performing a medically appropriate examination, counseling and educating the patient, documenting clinical information in the electronic health record, and care coordination.   Georga Kaufmann PA-C Dept of Hematology and Oncology [facility] at [facility] Phone: [phone]   02/19/2023 10:59 AM   Literature Support:  Danna Hefty A Systematic Review and Meta-Analysis about the Effect of Bisphosphonates on the Risk of Skeletal-Related  Event in Men with Prostate Cancer. Anticancer Agents Med Chem.  2020;20(13):1604-1612.  --Our study demonstrated that bisphosphonates could not statistically significantly reduce the risk of SRE in patients with prostate cancer, neither in the subgroups with M1 or CSPC.

## 2023-02-20 LAB — TESTOSTERONE: Testosterone: <3 ng/dL — ABNORMAL LOW (ref 264–916)

## 2023-02-20 LAB — PROSTATE-SPECIFIC AG, SERUM (LABCORP): Prostate Specific Ag, Serum: 3.8 ng/mL (ref 0.0–4.0)

## 2023-03-11 ENCOUNTER — Telehealth: Payer: Self-pay

## 2023-03-11 NOTE — Telephone Encounter (Signed)
LM for pt with lab results and recommendations

## 2023-03-11 NOTE — Telephone Encounter (Signed)
-----   Message from Briant Cedar sent at 03/11/2023  9:10 AM EDT ----- Please notify patient his PSA level did increase slightly but still within normal range. Since recent scans didn't show any progression, we will monitor for now and repeat at next visit. ----- Message ----- From: Interface, Lab In [place] Sent: 02/19/2023  10:13 AM EDT To: Briant Cedar, PA-C

## 2023-03-27 ENCOUNTER — Encounter: Payer: Self-pay | Admitting: Hematology and Oncology

## 2023-04-08 ENCOUNTER — Encounter: Payer: Self-pay | Admitting: Podiatry

## 2023-04-08 ENCOUNTER — Ambulatory Visit (INDEPENDENT_AMBULATORY_CARE_PROVIDER_SITE_OTHER): Payer: Medicare Other | Admitting: Podiatry

## 2023-04-08 DIAGNOSIS — M79675 Pain in left toe(s): Secondary | ICD-10-CM | POA: Diagnosis not present

## 2023-04-08 DIAGNOSIS — B351 Tinea unguium: Secondary | ICD-10-CM

## 2023-04-08 DIAGNOSIS — Z7901 Long term (current) use of anticoagulants: Secondary | ICD-10-CM | POA: Diagnosis not present

## 2023-04-08 DIAGNOSIS — M79674 Pain in right toe(s): Secondary | ICD-10-CM | POA: Diagnosis not present

## 2023-04-08 NOTE — Progress Notes (Signed)
Subjective:  Patient ID: [name] [name], male    DOB: Oct 01, 1955,   MRN: [id number]  Chief Complaint  Patient presents with   Nail Problem    Pt presents for thick nails and discolored.    67 y.o. male presents for concern of thickened elongated and painful nails that are difficult to trim. Requesting to have them trimmed today. Patient not diabetic but is on eliquis and at risk for foot care  PCP:  Karenann Cai, NP    . Denies any other pedal complaints. Denies n/v/f/c.   Past Medical History:  Diagnosis Date   Brain tumor [facility])    diagnosed by MRI 6 months ago   Cancer [facility])    Pt reports brain tumor 2 years ago   Coronary artery disease 2011   s/p BMS stent LAD, [facility], [facility] [place], [place], Dr Kathie Rhodes. [name]   Depression    Dyspnea    High cholesterol    History of herniated intervertebral disc    HTN (hypertension)    Hyperlipidemia LDL goal <70 07/21/2013   Lung nodule    NSTEMI (non-ST elevated myocardial infarction) (HCC) 11/11/2017   Smoker    1/2 ppd x >40 years     Objective:  Physical Exam: Vascular: DP/PT pulses 2/4 bilateral. CFT <3 seconds. Normal hair growth on digits. No edema. Xerosis noted bilateral plantar feet.   Skin. No lacerations or abrasions bilateral feet. Nails 1-5 bilateral particularly the left great are thickened elongated and with subungual debris.  Musculoskeletal: MMT 5/5 bilateral lower extremities in DF, PF, Inversion and Eversion. Deceased ROM in DF of ankle joint.  Neurological: Sensation intact to light touch.   Assessment:   1. Pain due to onychomycosis of toenails of both feet   2. Chronic anticoagulation      Plan:  Patient was evaluated and treated and all questions answered. -Mechanically debrided all nails 1-5 bilateral using sterile nail nipper and filed with dremel without incident  -Answered all patient questions -Patient to return  in 3 months for at risk foot care -Patient advised to call the office if  any problems or questions arise in the meantime.   Louann Sjogren, DPM

## 2023-04-22 ENCOUNTER — Encounter: Payer: Self-pay | Admitting: Nurse Practitioner

## 2023-04-22 ENCOUNTER — Other Ambulatory Visit: Payer: Self-pay | Admitting: Nurse Practitioner

## 2023-04-22 DIAGNOSIS — T17908A Unspecified foreign body in respiratory tract, part unspecified causing other injury, initial encounter: Secondary | ICD-10-CM

## 2023-05-15 ENCOUNTER — Inpatient Hospital Stay: Payer: Medicare Other | Attending: Hematology and Oncology

## 2023-05-15 ENCOUNTER — Inpatient Hospital Stay: Payer: Medicare Other

## 2023-05-15 ENCOUNTER — Inpatient Hospital Stay (HOSPITAL_BASED_OUTPATIENT_CLINIC_OR_DEPARTMENT_OTHER): Payer: Medicare Other | Admitting: Hematology and Oncology

## 2023-05-15 VITALS — BP 160/101 | HR 85 | Temp 98.2°F | Resp 16 | Wt 179.9 lb

## 2023-05-15 DIAGNOSIS — Z79899 Other long term (current) drug therapy: Secondary | ICD-10-CM | POA: Diagnosis not present

## 2023-05-15 DIAGNOSIS — C61 Malignant neoplasm of prostate: Secondary | ICD-10-CM

## 2023-05-15 DIAGNOSIS — C7951 Secondary malignant neoplasm of bone: Secondary | ICD-10-CM | POA: Insufficient documentation

## 2023-05-15 DIAGNOSIS — Z5111 Encounter for antineoplastic chemotherapy: Secondary | ICD-10-CM | POA: Insufficient documentation

## 2023-05-15 LAB — CBC WITH DIFFERENTIAL (CANCER CENTER ONLY)
Abs Immature Granulocytes: 0.02 10*3/uL (ref 0.00–0.07)
Basophils Absolute: 0.1 10*3/uL (ref 0.0–0.1)
Basophils Relative: 1 %
Eosinophils Absolute: 0.1 10*3/uL (ref 0.0–0.5)
Eosinophils Relative: 2 %
HCT: 36.4 % — ABNORMAL LOW (ref 39.0–52.0)
Hemoglobin: 11.5 g/dL — ABNORMAL LOW (ref 13.0–17.0)
Immature Granulocytes: 0 %
Lymphocytes Relative: 32 %
Lymphs Abs: 2.3 10*3/uL (ref 0.7–4.0)
MCH: 27.4 pg (ref 26.0–34.0)
MCHC: 31.6 g/dL (ref 30.0–36.0)
MCV: 86.9 fL (ref 80.0–100.0)
Monocytes Absolute: 0.6 10*3/uL (ref 0.1–1.0)
Monocytes Relative: 8 %
Neutro Abs: 4.1 10*3/uL (ref 1.7–7.7)
Neutrophils Relative %: 57 %
Platelet Count: 180 10*3/uL (ref 150–400)
RBC: 4.19 MIL/uL — ABNORMAL LOW (ref 4.22–5.81)
RDW: 14.4 % (ref 11.5–15.5)
Smear Review: NORMAL
WBC Count: 7.2 10*3/uL (ref 4.0–10.5)
nRBC: 0 % (ref 0.0–0.2)

## 2023-05-15 LAB — CMP (CANCER CENTER ONLY)
ALT: 17 U/L (ref 0–44)
AST: 18 U/L (ref 15–41)
Albumin: 4.5 g/dL (ref 3.5–5.0)
Alkaline Phosphatase: 75 U/L (ref 38–126)
Anion gap: 7 (ref 5–15)
BUN: 25 mg/dL — ABNORMAL HIGH (ref 8–23)
CO2: 26 mmol/L (ref 22–32)
Calcium: 10.1 mg/dL (ref 8.9–10.3)
Chloride: 107 mmol/L (ref 98–111)
Creatinine: 1.51 mg/dL — ABNORMAL HIGH (ref 0.61–1.24)
GFR, Estimated: 51 mL/min — ABNORMAL LOW (ref >60)
Glucose, Bld: 124 mg/dL — ABNORMAL HIGH (ref 70–99)
Potassium: 4.2 mmol/L (ref 3.5–5.1)
Sodium: 140 mmol/L (ref 135–145)
Total Bilirubin: 0.3 mg/dL (ref <1.2)
Total Protein: 8.1 g/dL (ref 6.5–8.1)

## 2023-05-15 MED ORDER — LEUPROLIDE ACETATE (3 MONTH) 22.5 MG ~~LOC~~ KIT
22.5000 mg | PACK | Freq: Once | SUBCUTANEOUS | Status: AC
  Administered 2023-05-15: 22.5 mg via SUBCUTANEOUS
  Filled 2023-05-15: qty 22.5

## 2023-05-15 NOTE — Progress Notes (Signed)
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 Seen by pulmonary with plans for PET and biopsy, though he was lost to follow up.  3) 01/17/2020: sent to the ED by Orthopedic surgery due to metastasis of the spine noted on MRI. IN the ED CT C/A/P showed extensive left pelvic and retroperitoneal lymphadenopathy. Mediastinal and bilateral hilar lymphadenopathy as well as mixed lytic and sclerotic lesions are noted in the bony pelvis 4) 01/17/2020: PSA noted to be 1074.94 5) 01/22/2020: establish care with Dr. Leonides Schanz. Started Bicalutamide 50mg  PO daily. Plan for 28 days of therapy.   6) 02/05/2020: Lupron 22.5mg  started.  7) 03/03/2020: received 45mg  lupron with urology. PSA 9.6 8) 03/30/2020: prostate biopsy performed, confirmed adenocarcinoma of the prostate.  9) 04/25/2020: stopped Zytiga therapy after < 7 days due to worsening depression 10) 04/28/2020: received enzalutamide 160mg , did not take any doses 11) 06/15/2020: agreed to restart Zytiga 1000mg  daily with prednisone 5mg   12) 09/08/2020: Lupron 22.5mg  shot.  13) 12/02/2020: Lupron 22.5mg  shot.  14) 03/01/2021: Lupron 22.5mg  shot. 15) 05/24/2021: Lupron 22.5mg  shot. 16) 08/16/2021: Lupron 22.5mg  shot. 17) 11/15/2021: Lupron 22.5 mg shot. Patient reports he is not taking zytiga as prescribed.  18) 02/16/2022: Lupron 22.5mg  shot. 19) 05/21/2022: Lupron 22.5mg  shot. 20) 08/21/2022: Lupron 22.5mg   shot. 21) 11/20/2022: Lupron 22.5mg  shot.  Interval History:  Turney Blount 67 y.o. male with medical history significant for metastatic prostate cancer who presents for a follow up visit. The patient's last visit was on 08/21/2022. In the interim since the last visit Mr. Moscatello has continued on Lupron therapy but declines to continue Zytiga or enzalutamide  On exam today Mr. Zumbach reports he has been doing well in the interim since our last visit.  He reports his energy levels are "okay".  He reports he is simply "not motivated".  He notes he is able to walk long distances but simply chooses not to.  He is not having any lightheadedness, dizziness, or shortness of breath.  He denies any new bone or back pain.  He is still not taking any oral therapies for his prostate cancer because they cause mood disturbance.  He reports that the injections have been going well and he is not having any hot flashes or sweats.  He denies any recent infectious symptoms such as runny nose, sore throat, or cough.  His blood pressure is elevated today but he did not take his blood pressure medications this morning.  He denies fevers, chills, sweats, shortness of breath, chest pain or cough. He has no other complaints. Full 10 point ROS is listed below.  Overall he is willing and able to proceed with Lupron shots at this time.  MEDICAL HISTORY:  Past Medical History:  Diagnosis Date   Brain tumor [facility])    diagnosed by MRI 6 months ago   Cancer [facility])    Pt reports brain tumor 2 years ago   Coronary artery disease 2011   s/p BMS stent LAD, [facility], [facility] [place], [place], Dr  Vicente Males   Depression    Dyspnea    High cholesterol    History of herniated intervertebral disc    HTN (hypertension)    Hyperlipidemia LDL goal <70 07/21/2013   Lung nodule    NSTEMI (non-ST elevated myocardial infarction) (HCC) 11/11/2017   Smoker    1/2 ppd x >40 years     SURGICAL HISTORY: Past Surgical History:   Procedure Laterality Date   CORONARY ANGIOPLASTY WITH STENT PLACEMENT Left 2011   BMS LAD, [facility], [facility] [place], [place], Dr Kathie Rhodes. [name]   CORONARY STENT INTERVENTION N/A 11/12/2017   Procedure: CORONARY STENT INTERVENTION;  Surgeon: Marykay Lex, MD;  Location: [facility] INVASIVE CV LAB;  Service: Cardiovascular;  Laterality: N/A;   LEFT HEART CATH AND CORONARY ANGIOGRAPHY N/A 11/12/2017   Procedure: LEFT HEART CATH AND CORONARY ANGIOGRAPHY;  Surgeon: Marykay Lex, MD;  Location: [facility] INVASIVE CV LAB;  Service: Cardiovascular;  Laterality: N/A;    SOCIAL HISTORY: Social History   Socioeconomic History   Marital status: Single    Spouse name: Not on file   Number of children: Not on file   Years of education: Not on file   Highest education level: Not on file  Occupational History   Occupation: unemployed  Tobacco Use   Smoking status: Every Day    Current packs/day: 0.25    Average packs/day: 0.3 packs/day for 45.0 years (11.3 ttl pk-yrs)    Types: Cigarettes   Smokeless tobacco: Never  Vaping Use   Vaping status: Never Used  Substance and Sexual Activity   Alcohol use: Yes    Alcohol/week: 75.0 standard drinks of alcohol    Types: 75 Shots of liquor per week    Comment: 2 shots of liquor daily   Drug use: Not Currently    Types: Marijuana    Comment: occasionally   Sexual activity: Yes  Other Topics Concern   Not on file  Social History Narrative   From [place]    Family lives in [place] (brother, sister and mother)    Unemployed    Drinks 3 (1/5)ths in 1 week   Social Determinants of Health   Financial Resource Strain: Not on File (11/16/2021)   Received from [employer] Resource Strain: 0  Recent Concern: Physicist, medical Strain - At Risk (11/16/2021)   Received from [employer]    Financial Resource Strain: 2  Food Insecurity: Not on File (11/16/2021)   Received from [facility], [place], [place]    Food Insecurity    Food: 0  Recent Concern: Food Insecurity - At Risk (11/16/2021)   Received from [employer]    Food: 2  Transportation Needs: Unmet Transportation Needs (12/14/2021)   PRAPARE - Administrator, Civil Service (Medical): Not on file    Lack of Transportation (Non-Medical): Yes  Physical Activity: Not on File (10/12/2021)   Received from [place], [place]   Physical Activity    Physical Activity: 0  Stress: Not on File (10/12/2021)   Received from [facility], [place]   Stress    Stress: 0  Social Connections: Not on File (03/04/2023)   Received from [facility]   Social Connections    Connectedness: 0  Intimate Partner Violence: Not on file    FAMILY HISTORY: Family History  Problem Relation Age of Onset   Diabetes Paternal Grandfather    Hypertension Paternal Actor  ALLERGIES:  has No Known Allergies.  MEDICATIONS:  Current Outpatient Medications  Medication Sig Dispense Refill   aspirin 81 MG EC tablet Take 1 tablet (81 mg total) by mouth daily. (Patient not taking: Reported on 02/19/2023) 90 tablet 3   atorvastatin (LIPITOR) 80 MG tablet Take 1 tablet (80 mg total) by mouth daily. 90 tablet 3   calcium-vitamin D (OSCAL WITH D) 500-200 MG-UNIT tablet Take 1 tablet by mouth daily with breakfast. (Patient not taking: Reported on 02/19/2023) 90 tablet 3   carvedilol (COREG) 6.25 MG tablet Take 1 tablet (6.25 mg total) by mouth 2 (two) times daily with a meal. (Patient not taking: Reported on 02/19/2023) 180 tablet 3   clopidogrel (PLAVIX) 75 MG tablet Take 1 tablet (75 mg total) by mouth daily. (Patient not taking: Reported on 02/19/2023) 90 tablet 3   enzalutamide (XTANDI) 40 MG capsule Take 4 capsules (160 mg total) by mouth daily. (Patient not taking: Reported on 02/19/2023) 120 capsule 3   lisinopril (ZESTRIL) 10 MG tablet Take 1 tablet (10 mg total) by mouth daily. (Patient not taking: Reported on 02/19/2023) 90 tablet 3   nitroGLYCERIN (NITROSTAT)  0.4 MG SL tablet Place 1 tablet (0.4 mg total) under the tongue every 5 (five) minutes as needed for chest pain. (Patient not taking: Reported on 02/19/2023) 25 tablet 1   No current facility-administered medications for this visit.    REVIEW OF SYSTEMS:   Constitutional: ( - ) fevers, ( - )  chills , ( - ) night sweats Eyes: ( - ) blurriness of vision, ( - ) double vision, ( - ) watery eyes Ears, nose, mouth, throat, and face: ( - ) mucositis, ( - ) sore throat Respiratory: ( - ) cough, ( - ) dyspnea, ( - ) wheezes Cardiovascular: ( - ) palpitation, ( - ) chest discomfort, ( - ) lower extremity swelling Gastrointestinal:  ( - ) nausea, ( - ) heartburn, ( - ) change in bowel habits Skin: ( - ) abnormal skin rashes Lymphatics: ( - ) new lymphadenopathy, ( - ) easy bruising Neurological: ( - ) numbness, ( - ) tingling, ( - ) new weaknesses Behavioral/Psych: ( - ) mood change, ( - ) new changes  All other systems were reviewed with the patient and are negative.  PHYSICAL EXAMINATION: ECOG PERFORMANCE STATUS: 1 - Symptomatic but completely ambulatory  Vitals:   05/15/23 1121  BP: (!) 160/101  Pulse: 85  Resp: 16  Temp: 98.2 F (36.8 C)  SpO2: 98%     Filed Weights   05/15/23 1121  Weight: 179 lb 14.4 oz (81.6 kg)      GENERAL:  Well appearing middle aged [place] American male. alert, no distress and comfortable SKIN: skin color, texture, turgor are normal, no rashes or significant lesions EYES: conjunctiva are pink and non-injected, sclera clear LUNGS: clear to auscultation and percussion with normal breathing effort HEART: regular rate & rhythm and no murmurs and no lower extremity edema Musculoskeletal: no cyanosis of digits and no clubbing  PSYCH: alert & oriented x 3, fluent speech NEURO: no focal motor/sensory deficits  LABORATORY DATA:  I have reviewed the data as listed    Latest Ref Rng & Units 05/15/2023   10:52 AM 02/19/2023   10:00 AM 11/20/2022   10:04 AM   CBC  WBC 4.0 - 10.5 K/uL 7.2  7.0  5.2   Hemoglobin 13.0 - 17.0 g/dL 32.9  51.8  84.1   Hematocrit 39.0 -  52.0 % 36.4  34.8  35.6   Platelets 150 - 400 K/uL 180  189  193        Latest Ref Rng & Units 05/15/2023   10:52 AM 02/19/2023   10:00 AM 02/18/2023    9:31 AM  CMP  Glucose 70 - 99 mg/dL 454  098    BUN 8 - 23 mg/dL 25  21    Creatinine 1.19 - 1.24 mg/dL 1.47  8.29  5.62   Sodium 135 - 145 mmol/L 140  140    Potassium 3.5 - 5.1 mmol/L 4.2  4.1    Chloride 98 - 111 mmol/L 107  107    CO2 22 - 32 mmol/L 26  25    Calcium 8.9 - 10.3 mg/dL 13.0  9.7    Total Protein 6.5 - 8.1 g/dL 8.1  7.8    Total Bilirubin <1.2 mg/dL 0.3  0.3    Alkaline Phos 38 - 126 U/L 75  69    AST 15 - 41 U/L 18  21    ALT 0 - 44 U/L 17  22     RADIOGRAPHIC STUDIES: No results found.  ASSESSMENT & PLAN Oak Bonfante is a 67 y.o. male with medical history significant for metastatic prostate cancer who presents for a follow up visit.    # Metastatic Castrate Sensitive Prostate Cancer  --prostate biopsy performed by urology on 03/30/2020, confirms diagnosis of prostate adenocarcinoma. Gleason score not calculated due to ADT therapy  --the literature does not currently support the use of bisphosphonates in the setting of CSPC.  --CT C/A/P Scan and NM bone scan last performed in June 2023. Noted to have stable disease. Continue with imaging q 6 months.  ----Patient does not want to take abiraterone or enzalutamide 160 mg p.o. daily. Plan: --continue calcium/vitamin D for bone protection on ADT therapy.  --Due for next dose of lupron 22.5 mg today. Next shot due in Feb 2025.  --testosterone at <3 at last check, PSA increased to 3.8 on 02/19/2023 (up from 1.2 on 08/21/2022) --labs today show white blood cell 7.2, hemoglobin 11.5, MCV 86.9, platelets 180.  Creatinine stable at 1.51. LFTs normal.  --Repeat CT CAP and Bone scan show stable disease with no evidence of new or progressive disease when last  checked in August 2024 --If progression of disease is noted we will need to talk with the patient about restarting Zytiga or enzalutamide to better control his disease. --RTC 3 months with labs, follow up and next Lupron injection  # Depression --No longer taking citalopram 20 mg p.o. daily --He plans to re-establish care with [facility].   # Symptom management -- oxycodone 5mg  q6H PRN for pain.  --encourage OTC senna for constipation prevention if taking opioids.   # Goals of Care --discussed the incurable nature of metastatic disease --emphasized that all treatments moving forward are focused on symptom control, decreasing tumor size/slow spread, and increasing lifespan.  --patient voiced understanding of the palliative nature of treatments moving forward.  --patients MPOA is Chanel Terie Purser (his daughter)   No orders of the defined types were placed in this encounter.  All questions were answered. The patient knows to call the clinic with any problems, questions or concerns.  I have spent a total of 30 minutes minutes of face-to-face and non-face-to-face time, preparing to see the patient, performing a medically appropriate examination, counseling and educating the patient, documenting clinical information in the electronic health record, and care coordination.  Ulysees Barns, MD Department of Hematology/Oncology [facility] Phone: [phone] Pager: [phone] Email: [email or web address]    05/15/2023 4:05 PM   Literature Support:  Danna Hefty A Systematic Review and Meta-Analysis about the Effect of Bisphosphonates on the Risk of Skeletal-Related Event in Men with Prostate Cancer. Anticancer Agents Med Chem. 2020;20(13):1604-1612.  --Our study demonstrated that bisphosphonates could not statistically significantly reduce the risk of SRE in patients with prostate cancer, neither in the subgroups with M1 or CSPC.

## 2023-05-16 LAB — TESTOSTERONE: Testosterone: <3 ng/dL — ABNORMAL LOW (ref 264–916)

## 2023-05-16 LAB — PROSTATE-SPECIFIC AG, SERUM (LABCORP): Prostate Specific Ag, Serum: 5.7 ng/mL — ABNORMAL HIGH (ref 0.0–4.0)

## 2023-05-17 ENCOUNTER — Telehealth: Payer: Self-pay | Admitting: *Deleted

## 2023-05-17 NOTE — Telephone Encounter (Signed)
-----   Message from Ulysees Barns IV sent at 05/16/2023 10:20 AM EST ----- Please let Mr. Les know that his PSA rose to 5.7.  I would strongly recommend that we start either the Zytiga or the enzalutamide in order to better control his prostate cancer.  I do believe he still has some enzalutamide left from our last prescription.  Please see if he be willing to consider restarting this medication. ----- Message ----- From: Leory Plowman, Lab [facility] [place] Sent: 05/15/2023  11:42 AM EST To: Jaci Standard, MD

## 2023-05-17 NOTE — Telephone Encounter (Signed)
[name] patient regarding PSA results. No answer, but was able to leave detailed message regarding his PSA and restarting his Ztiga as his PSA is increasing. Advised to call after getting this message @ [phone]

## 2023-07-19 ENCOUNTER — Inpatient Hospital Stay (HOSPITAL_COMMUNITY)
Admission: EM | Admit: 2023-07-19 | Discharge: 2023-07-23 | DRG: 321 | Disposition: A | Discharge status: Home / Self Care | Payer: Medicare Other | Attending: Cardiovascular Disease | Admitting: Cardiovascular Disease

## 2023-07-19 ENCOUNTER — Emergency Department (HOSPITAL_COMMUNITY): Payer: Medicare Other

## 2023-07-19 ENCOUNTER — Encounter (HOSPITAL_COMMUNITY): Payer: Self-pay

## 2023-07-19 ENCOUNTER — Other Ambulatory Visit: Payer: Self-pay

## 2023-07-19 DIAGNOSIS — T82855A Stenosis of coronary artery stent, initial encounter: Secondary | ICD-10-CM | POA: Diagnosis present

## 2023-07-19 DIAGNOSIS — Z5941 Food insecurity: Secondary | ICD-10-CM

## 2023-07-19 DIAGNOSIS — N1831 Chronic kidney disease, stage 3a: Secondary | ICD-10-CM | POA: Diagnosis present

## 2023-07-19 DIAGNOSIS — Z8673 Personal history of transient ischemic attack (TIA), and cerebral infarction without residual deficits: Secondary | ICD-10-CM

## 2023-07-19 DIAGNOSIS — F17210 Nicotine dependence, cigarettes, uncomplicated: Secondary | ICD-10-CM | POA: Diagnosis present

## 2023-07-19 DIAGNOSIS — I252 Old myocardial infarction: Secondary | ICD-10-CM

## 2023-07-19 DIAGNOSIS — D320 Benign neoplasm of cerebral meninges: Secondary | ICD-10-CM | POA: Diagnosis present

## 2023-07-19 DIAGNOSIS — Z955 Presence of coronary angioplasty implant and graft: Secondary | ICD-10-CM | POA: Diagnosis not present

## 2023-07-19 DIAGNOSIS — F1020 Alcohol dependence, uncomplicated: Secondary | ICD-10-CM | POA: Diagnosis present

## 2023-07-19 DIAGNOSIS — Z716 Tobacco abuse counseling: Secondary | ICD-10-CM | POA: Diagnosis not present

## 2023-07-19 DIAGNOSIS — C7951 Secondary malignant neoplasm of bone: Secondary | ICD-10-CM | POA: Diagnosis present

## 2023-07-19 DIAGNOSIS — D631 Anemia in chronic kidney disease: Secondary | ICD-10-CM | POA: Diagnosis present

## 2023-07-19 DIAGNOSIS — Z8249 Family history of ischemic heart disease and other diseases of the circulatory system: Secondary | ICD-10-CM

## 2023-07-19 DIAGNOSIS — Y831 Surgical operation with implant of artificial internal device as the cause of abnormal reaction of the patient, or of later complication, without mention of misadventure at the time of the procedure: Secondary | ICD-10-CM | POA: Diagnosis present

## 2023-07-19 DIAGNOSIS — Z7982 Long term (current) use of aspirin: Secondary | ICD-10-CM

## 2023-07-19 DIAGNOSIS — I2510 Atherosclerotic heart disease of native coronary artery without angina pectoris: Secondary | ICD-10-CM | POA: Diagnosis present

## 2023-07-19 DIAGNOSIS — Z86011 Personal history of benign neoplasm of the brain: Secondary | ICD-10-CM

## 2023-07-19 DIAGNOSIS — I129 Hypertensive chronic kidney disease with stage 1 through stage 4 chronic kidney disease, or unspecified chronic kidney disease: Secondary | ICD-10-CM | POA: Diagnosis present

## 2023-07-19 DIAGNOSIS — R0789 Other chest pain: Principal | ICD-10-CM

## 2023-07-19 DIAGNOSIS — E7800 Pure hypercholesterolemia, unspecified: Secondary | ICD-10-CM | POA: Diagnosis present

## 2023-07-19 DIAGNOSIS — E785 Hyperlipidemia, unspecified: Secondary | ICD-10-CM | POA: Diagnosis not present

## 2023-07-19 DIAGNOSIS — Z91148 Patient's other noncompliance with medication regimen for other reason: Secondary | ICD-10-CM

## 2023-07-19 DIAGNOSIS — C61 Malignant neoplasm of prostate: Secondary | ICD-10-CM | POA: Diagnosis present

## 2023-07-19 DIAGNOSIS — R9431 Abnormal electrocardiogram [ECG] [EKG]: Secondary | ICD-10-CM | POA: Diagnosis not present

## 2023-07-19 DIAGNOSIS — Z5982 Transportation insecurity: Secondary | ICD-10-CM

## 2023-07-19 DIAGNOSIS — R079 Chest pain, unspecified: Secondary | ICD-10-CM | POA: Diagnosis not present

## 2023-07-19 DIAGNOSIS — I723 Aneurysm of iliac artery: Secondary | ICD-10-CM | POA: Diagnosis present

## 2023-07-19 DIAGNOSIS — Z560 Unemployment, unspecified: Secondary | ICD-10-CM | POA: Diagnosis not present

## 2023-07-19 DIAGNOSIS — I214 Non-ST elevation (NSTEMI) myocardial infarction: Secondary | ICD-10-CM | POA: Diagnosis present

## 2023-07-19 DIAGNOSIS — Z7141 Alcohol abuse counseling and surveillance of alcoholic: Secondary | ICD-10-CM | POA: Diagnosis not present

## 2023-07-19 DIAGNOSIS — D649 Anemia, unspecified: Secondary | ICD-10-CM | POA: Diagnosis not present

## 2023-07-19 DIAGNOSIS — Z7902 Long term (current) use of antithrombotics/antiplatelets: Secondary | ICD-10-CM | POA: Diagnosis not present

## 2023-07-19 DIAGNOSIS — Z833 Family history of diabetes mellitus: Secondary | ICD-10-CM | POA: Diagnosis not present

## 2023-07-19 DIAGNOSIS — I10 Essential (primary) hypertension: Secondary | ICD-10-CM

## 2023-07-19 DIAGNOSIS — Z79899 Other long term (current) drug therapy: Secondary | ICD-10-CM

## 2023-07-19 HISTORY — DX: Alcohol use, unspecified, uncomplicated: F10.90

## 2023-07-19 HISTORY — DX: Other cerebral infarction due to occlusion or stenosis of small artery: I63.81

## 2023-07-19 HISTORY — DX: Aortic ectasia, unspecified site: I77.819

## 2023-07-19 HISTORY — DX: Aneurysm of iliac artery: I72.3

## 2023-07-19 HISTORY — DX: Benign neoplasm of meninges, unspecified: D32.9

## 2023-07-19 HISTORY — DX: Other specified health status: Z78.9

## 2023-07-19 HISTORY — DX: Malignant neoplasm of prostate: C61

## 2023-07-19 HISTORY — DX: Secondary malignant neoplasm of bone: C79.51

## 2023-07-19 HISTORY — DX: Disorder of arteries and arterioles, unspecified: I77.9

## 2023-07-19 LAB — RAPID URINE DRUG SCREEN, HOSP PERFORMED
Amphetamines: NOT DETECTED
Barbiturates: NOT DETECTED
Benzodiazepines: NOT DETECTED
Cocaine: NOT DETECTED
Opiates: NOT DETECTED
Tetrahydrocannabinol: NOT DETECTED

## 2023-07-19 LAB — CBC
HCT: 37.2 % — ABNORMAL LOW (ref 39.0–52.0)
Hemoglobin: 11.9 g/dL — ABNORMAL LOW (ref 13.0–17.0)
MCH: 27.6 pg (ref 26.0–34.0)
MCHC: 32 g/dL (ref 30.0–36.0)
MCV: 86.3 fL (ref 80.0–100.0)
Platelets: 187 10*3/uL (ref 150–400)
RBC: 4.31 MIL/uL (ref 4.22–5.81)
RDW: 13.8 % (ref 11.5–15.5)
WBC: 6.8 10*3/uL (ref 4.0–10.5)
nRBC: 0 % (ref 0.0–0.2)

## 2023-07-19 LAB — BASIC METABOLIC PANEL
Anion gap: 8 (ref 5–15)
BUN: 17 mg/dL (ref 8–23)
CO2: 23 mmol/L (ref 22–32)
Calcium: 9.6 mg/dL (ref 8.9–10.3)
Chloride: 107 mmol/L (ref 98–111)
Creatinine, Ser: 1.43 mg/dL — ABNORMAL HIGH (ref 0.61–1.24)
GFR, Estimated: 54 mL/min — ABNORMAL LOW (ref >60)
Glucose, Bld: 99 mg/dL (ref 70–99)
Potassium: 4.1 mmol/L (ref 3.5–5.1)
Sodium: 138 mmol/L (ref 135–145)

## 2023-07-19 LAB — TROPONIN I (HIGH SENSITIVITY)
Troponin I (High Sensitivity): 45 ng/L — ABNORMAL HIGH (ref <18)
Troponin I (High Sensitivity): 47 ng/L — ABNORMAL HIGH (ref <18)

## 2023-07-19 LAB — MRSA NEXT GEN BY PCR, NASAL: MRSA by PCR Next Gen: NOT DETECTED

## 2023-07-19 MED ORDER — CARVEDILOL 3.125 MG PO TABS
6.2500 mg | ORAL_TABLET | Freq: Two times a day (BID) | ORAL | Status: DC
Start: 2023-07-19 — End: 2023-07-19

## 2023-07-19 MED ORDER — LORAZEPAM 1 MG PO TABS
0.0000 mg | ORAL_TABLET | Freq: Two times a day (BID) | ORAL | Status: DC
Start: 2023-07-21 — End: 2023-07-23

## 2023-07-19 MED ORDER — ASPIRIN 81 MG PO CHEW
324.0000 mg | CHEWABLE_TABLET | Freq: Once | ORAL | Status: AC
  Administered 2023-07-19: 324 mg via ORAL
  Filled 2023-07-19: qty 4

## 2023-07-19 MED ORDER — LORAZEPAM 2 MG/ML IJ SOLN: 1.0000 mg | INTRAMUSCULAR | Status: AC | PRN

## 2023-07-19 MED ORDER — LORAZEPAM 1 MG PO TABS: 1.0000 mg | ORAL_TABLET | ORAL | Status: AC | PRN

## 2023-07-19 MED ORDER — ATORVASTATIN CALCIUM 80 MG PO TABS
80.0000 mg | ORAL_TABLET | Freq: Every day | ORAL | Status: DC
  Administered 2023-07-19 – 2023-07-23 (×5): 80 mg via ORAL
  Filled 2023-07-19: qty 2
  Filled 2023-07-19 (×4): qty 1

## 2023-07-19 MED ORDER — THIAMINE HCL 100 MG/ML IJ SOLN
100.0000 mg | Freq: Every day | INTRAMUSCULAR | Status: DC
  Filled 2023-07-19: qty 2

## 2023-07-19 MED ORDER — PANTOPRAZOLE SODIUM 40 MG PO TBEC
40.0000 mg | DELAYED_RELEASE_TABLET | Freq: Every day | ORAL | Status: DC
  Administered 2023-07-19 – 2023-07-23 (×5): 40 mg via ORAL
  Filled 2023-07-19 (×5): qty 1

## 2023-07-19 MED ORDER — THIAMINE MONONITRATE 100 MG PO TABS
100.0000 mg | ORAL_TABLET | Freq: Every day | ORAL | Status: DC
  Administered 2023-07-19 – 2023-07-23 (×5): 100 mg via ORAL
  Filled 2023-07-19 (×5): qty 1

## 2023-07-19 MED ORDER — LORAZEPAM 1 MG PO TABS: 0.0000 mg | ORAL_TABLET | Freq: Four times a day (QID) | ORAL | Status: AC

## 2023-07-19 MED ORDER — AMLODIPINE BESYLATE 5 MG PO TABS
5.0000 mg | ORAL_TABLET | Freq: Every day | ORAL | Status: DC
  Administered 2023-07-19 – 2023-07-20 (×2): 5 mg via ORAL
  Filled 2023-07-19 (×2): qty 1

## 2023-07-19 MED ORDER — ADULT MULTIVITAMIN W/MINERALS CH
1.0000 | ORAL_TABLET | Freq: Every day | ORAL | Status: DC
  Administered 2023-07-19 – 2023-07-23 (×5): 1 via ORAL
  Filled 2023-07-19 (×5): qty 1

## 2023-07-19 MED ORDER — ASPIRIN 81 MG PO TBEC
81.0000 mg | DELAYED_RELEASE_TABLET | Freq: Every day | ORAL | Status: DC
  Administered 2023-07-20 – 2023-07-23 (×3): 81 mg via ORAL
  Filled 2023-07-19 (×4): qty 1

## 2023-07-19 MED ORDER — FOLIC ACID 1 MG PO TABS
1.0000 mg | ORAL_TABLET | Freq: Every day | ORAL | Status: DC
  Administered 2023-07-19 – 2023-07-23 (×5): 1 mg via ORAL
  Filled 2023-07-19 (×5): qty 1

## 2023-07-19 NOTE — ED Provider Notes (Signed)
[place] EMERGENCY DEPARTMENT AT [facility] Provider Note   CSN: [id number] Arrival date & time: 07/19/23  2440     History  Chief Complaint  Patient presents with   Chest Pain   Shortness of Breath    Jenna Ardoin is a 68 y.o. male.  Patient with history of HTN, HLD, CAD (stent 2011 in [place], 2019 in [place]), noncompliance with medications, continuous tobacco use, to ED for evaluation of chest pain that started Monday (4 days ago) and has been on and off since. No pain at present. He reports associated SOB without cough or fever. No nausea, vomiting. The pain is center and left chest without radiation. No modifying factors for chest pain, but he reports his SOB is worse with exertion. No leg swelling. He reports he is out of all medications and has been for the past month.   The history is provided by the patient. No language interpreter was used.  Chest Pain Associated symptoms: shortness of breath   Shortness of Breath Associated symptoms: chest pain        Home Medications Prior to Admission medications   Medication Sig Start Date End Date Taking? Authorizing Provider  nitroGLYCERIN (NITROSTAT) 0.4 MG SL tablet Place 1 tablet (0.4 mg total) under the tongue every 5 (five) minutes as needed for chest pain. 04/28/21  Yes Runell Gess, MD  aspirin 81 MG EC tablet Take 1 tablet (81 mg total) by mouth daily. Patient not taking: Reported on 02/19/2023 12/25/19   Runell Gess, MD  atorvastatin (LIPITOR) 80 MG tablet Take 1 tablet (80 mg total) by mouth daily. Patient not taking: Reported on 07/19/2023 08/22/22 08/22/23  Runell Gess, MD  calcium-vitamin D Ruthell Rummage WITH D) 500-200 MG-UNIT tablet Take 1 tablet by mouth daily with breakfast. Patient not taking: Reported on 02/19/2023 02/19/20   Jaci Standard, MD  carvedilol (COREG) 6.25 MG tablet Take 1 tablet (6.25 mg total) by mouth 2 (two) times daily with a meal. Patient not taking: Reported on  02/19/2023 08/22/22   Runell Gess, MD  clopidogrel (PLAVIX) 75 MG tablet Take 1 tablet (75 mg total) by mouth daily. Patient not taking: Reported on 02/19/2023 08/22/22   Runell Gess, MD  enzalutamide Diana Eves) 40 MG capsule Take 4 capsules (160 mg total) by mouth daily. Patient not taking: Reported on 02/19/2023 02/25/22   Jaci Standard, MD  lisinopril (ZESTRIL) 10 MG tablet Take 1 tablet (10 mg total) by mouth daily. Patient not taking: Reported on 02/19/2023 08/22/22   Runell Gess, MD      Allergies    Patient has no known allergies.    Review of Systems   Review of Systems  Respiratory:  Positive for shortness of breath.   Cardiovascular:  Positive for chest pain.    Physical Exam Updated Vital Signs BP 136/89   Pulse (!) 119   Temp 98.2 F (36.8 C) (Oral)   Resp 17   Ht 6\' 2"  (1.88 m)   Wt 85.7 kg   SpO2 99%   BMI 24.27 kg/m  Physical Exam Vitals and nursing note reviewed.  Constitutional:      Appearance: He is well-developed.  HENT:     Head: Normocephalic.  Neck:     Vascular: No carotid bruit.  Cardiovascular:     Rate and Rhythm: Normal rate and regular rhythm.     Heart sounds: No murmur heard. Pulmonary:     Effort: Pulmonary  effort is normal.     Breath sounds: Normal breath sounds. No wheezing, rhonchi or rales.  Abdominal:     General: Bowel sounds are normal.     Palpations: Abdomen is soft.     Tenderness: There is no abdominal tenderness. There is no guarding or rebound.  Musculoskeletal:        General: Normal range of motion.     Cervical back: Normal range of motion and neck supple.     Right lower leg: No edema.     Left lower leg: No edema.  Skin:    General: Skin is warm and dry.  Neurological:     General: No focal deficit present.     Mental Status: He is alert and oriented to person, place, and time.     ED Results / Procedures / Treatments   Labs (all labs ordered are listed, but only abnormal results are  displayed) Labs Reviewed  BASIC METABOLIC PANEL - Abnormal; Notable for the following components:      Result Value   Creatinine, Ser 1.43 (*)    GFR, Estimated 54 (*)    All other components within normal limits  CBC - Abnormal; Notable for the following components:   Hemoglobin 11.9 (*)    HCT 37.2 (*)    All other components within normal limits  TROPONIN I (HIGH SENSITIVITY) - Abnormal; Notable for the following components:   Troponin I (High Sensitivity) 45 (*)    All other components within normal limits  TROPONIN I (HIGH SENSITIVITY) - Abnormal; Notable for the following components:   Troponin I (High Sensitivity) 47 (*)    All other components within normal limits  RAPID URINE DRUG SCREEN, HOSP PERFORMED   Results for orders placed or performed during the hospital encounter of 07/19/23  Basic metabolic panel   Collection Time: 07/19/23  9:36 AM  Result Value Ref Range   Sodium 138 135 - 145 mmol/L   Potassium 4.1 3.5 - 5.1 mmol/L   Chloride 107 98 - 111 mmol/L   CO2 23 22 - 32 mmol/L   Glucose, Bld 99 70 - 99 mg/dL   BUN 17 8 - 23 mg/dL   Creatinine, Ser 2.99 (H) 0.61 - 1.24 mg/dL   Calcium 9.6 8.9 - 24.2 mg/dL   GFR, Estimated 54 (L) >60 mL/min   Anion gap 8 5 - 15  CBC   Collection Time: 07/19/23  9:36 AM  Result Value Ref Range   WBC 6.8 4.0 - 10.5 K/uL   RBC 4.31 4.22 - 5.81 MIL/uL   Hemoglobin 11.9 (L) 13.0 - 17.0 g/dL   HCT 68.3 (L) 41.9 - 62.2 %   MCV 86.3 80.0 - 100.0 fL   MCH 27.6 26.0 - 34.0 pg   MCHC 32.0 30.0 - 36.0 g/dL   RDW 29.7 98.9 - 21.1 %   Platelets 187 150 - 400 K/uL   nRBC 0.0 0.0 - 0.2 %  Troponin I (High Sensitivity)   Collection Time: 07/19/23  9:36 AM  Result Value Ref Range   Troponin I (High Sensitivity) 45 (H) <18 ng/L  Urine rapid drug screen (hosp performed)   Collection Time: 07/19/23 11:49 AM  Result Value Ref Range   Opiates NONE DETECTED NONE DETECTED   Cocaine NONE DETECTED NONE DETECTED   Benzodiazepines NONE DETECTED  NONE DETECTED   Amphetamines NONE DETECTED NONE DETECTED   Tetrahydrocannabinol NONE DETECTED NONE DETECTED   Barbiturates NONE DETECTED NONE DETECTED  Troponin I (  High Sensitivity)   Collection Time: 07/19/23 11:49 AM  Result Value Ref Range   Troponin I (High Sensitivity) 47 (H) <18 ng/L     EKG EKG Interpretation Date/Time:  Friday July 19 2023 09:33:20 EST Ventricular Rate:  66 PR Interval:  200 QRS Duration:  90 QT Interval:  394 QTC Calculation: 413 R Axis:   72  Text Interpretation: Normal sinus rhythm with sinus arrhythmia Minimal voltage criteria for LVH, may be normal variant ( Sokolow-Lyon ) ST & T wave abnormality, consider inferior ischemia ST & T wave abnormality, consider anterolateral ischemia Abnormal ECG When compared with ECG of 13-Oct-2019 12:22, PREVIOUS ECG IS PRESENT T waves changed from previous, no STEMI Confirmed by Coralee Pesa [phone]) on 07/19/2023 10:24:17 AM  Radiology DG Chest 2 View Result Date: 07/19/2023 CLINICAL DATA:  Shortness of breath. EXAM: CHEST - 2 VIEW COMPARISON:  Chest radiograph dated December 11, 2019. CTA chest abdomen and pelvis dated February 18, 2023. FINDINGS: The heart size and mediastinal contours are within normal limits. Aortic atherosclerosis. No focal consolidation, pleural effusion, or pneumothorax. No acute osseous abnormality. IMPRESSION: No acute cardiopulmonary findings. Electronically Signed   By: Hart Robinsons M.D.   On: 07/19/2023 10:06    Procedures Procedures    Medications Ordered in ED Medications  aspirin chewable tablet 324 mg (has no administration in time range)  pantoprazole (PROTONIX) EC tablet 40 mg (has no administration in time range)  amLODipine (NORVASC) tablet 5 mg (has no administration in time range)  LORazepam (ATIVAN) tablet 1-4 mg (has no administration in time range)    Or  LORazepam (ATIVAN) injection 1-4 mg (has no administration in time range)  thiamine (VITAMIN B1) tablet 100 mg (has  no administration in time range)    Or  thiamine (VITAMIN B1) injection 100 mg (has no administration in time range)  folic acid (FOLVITE) tablet 1 mg (has no administration in time range)  multivitamin with minerals tablet 1 tablet (has no administration in time range)  LORazepam (ATIVAN) tablet 0-4 mg (has no administration in time range)    Followed by  LORazepam (ATIVAN) tablet 0-4 mg (has no administration in time range)  aspirin EC tablet 81 mg (has no administration in time range)  atorvastatin (LIPITOR) tablet 80 mg (has no administration in time range)    ED Course/ Medical Decision Making/ A&P Clinical Course as of 07/19/23 1626  Fri Jul 19, 2023  1356 Discussed with cardiology who will see the patient in the ED and advise.  [SU]    Clinical Course User Index [SU] Elpidio Anis, PA-C                                 Medical Decision Making This patient presents to the ED for concern of chest pain, this involves an extensive number of treatment options, and is a complaint that carries with it a high risk of complications and morbidity.  The differential diagnosis includes ACS, PNA, viral URI, PTX, PE   Co morbidities that complicate the patient evaluation  HTN, HLD, CAD, medication noncompliance, smoker   Additional history obtained:  Additional history and/or information obtained from chart review, notable for Previous cardiology in-office visit Allyson Sabal, 07/2022)   Lab Tests:  I Ordered, and personally interpreted labs.  The pertinent results include:  Troponin 45; Normal WBC, hgb 11.9; Cr 1.43 (baseline)    Imaging Studies ordered:  I ordered imaging studies  including CXR I independently visualized and interpreted imaging which showed NAD per radiologist interpretation   Cardiac Monitoring:  The patient was maintained on a cardiac monitor.  I personally viewed and interpreted the cardiac monitored which showed an underlying rhythm of: New T-wave inversions  V3-V5    Consultations Obtained:  I requested consultation with the cardiology,  and discussed lab and imaging findings as well as pertinent plan - they recommend: they will admit to their service. Plan to cath on Monday.    Problem List / ED Course:  Intermittent chest pain x 4 days, DOE No fever, cough No pain currently EKG showing ST and T wave abnormalities since last tracing (V3-V5 T-wave inversion, slight T-wave depression inferolateral) Initial troponin 45, delta pending  11:45 - patient appears comfortable. Still pain free.  Troponin 45 --> 47  This was discussed with cardiology who will provide consultation in the ED  15:50 - per cardiology - admit to their service for catheterization      Social Determinants of Health:  Medical noncompliance   Disposition:  After consideration of the diagnostic results and the patients response to treatment, I feel that the patient would benefit from admit to cardiology.   Amount and/or Complexity of Data Reviewed Labs: ordered. Radiology: ordered.  Risk Decision regarding hospitalization.           Final Clinical Impression(s) / ED Diagnoses Final diagnoses:  Atypical chest pain  NSTEMI (non-ST elevated myocardial infarction) [facility])    Rx / DC Orders ED Discharge Orders     None         Elpidio Anis, PA-C 07/19/23 1626    Horton, Clabe Seal, DO 07/27/23 [phone]

## 2023-07-19 NOTE — ED Triage Notes (Signed)
Pt came to ED for chest pain and SHOB that started a couple days ago. Pt states substernal CP and does nto radiate. SHOB with exertion.

## 2023-07-19 NOTE — H&P (Addendum)
Cardiology Admission History and Physical   Patient ID: [name] [name] MRN: [id number]; DOB: Nov 13, 1955   Admission date: 07/19/2023  PCP:  Karenann Cai, NP   [facility] Providers Cardiologist:  Nanetta Batty, MD       Chief Complaint:  chest pain, shortness of breath  Patient Profile:   [name] [name] is a 68 y.o. 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  History of Present Illness:   [name] [name] had above cardiac hx. Last cath was in 2019 at time of MI/PCI with extensive Cx-OM disease treated with 2 overlapping DES. He had residual small caliber ramus & AV Groove cx vessels with 80% & 95% lesions to be treated medically and mild ISR of pLAD stent with normal EF. Last echo 2021 EF 60-65%, G1DD, mild MR, mild aortic sclerosis without stenosis. Stress test was ordered in 2021 but the patient did not complete this. He also has seen neurosurgery for meningioma diagnosed in 2018. Per neurosurgery [facility] note 04/2023, patient has a known history of large right-sided sphenoid wing meningioma for which the recommendation from the multidisciplinary tumor conference was to proceed with surgical resection. He was lost to follow-up for 2 years but recently saw them back for headache and sinus pain for a month; unable to see subsequent A/P. The patient states they planned to schedule him for a follow-up appointment in the  future but he doesn't know any further details. 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 There is a history of noncompliance with medication therapy.    He returned to the hospital with SOB for the past 4 days more noticeable with exertion, as well as on/off chest pain for 2-3 days. He describes the chest pain as a tightness/pulling sensation, happening at random, lasting variable amounts of time. It has seemed to be worse at night when lying down to sleep. Last night his chest pain was so bad for hours so came to ED today. hsTroponin 45->47, Cr 1.43 and Hgb 11.9 c/w prior. CXR NAD. SBP 169/135 on arrival, last checked 160/79 without intervention. UDS neg. EKG shows NSR 66bpm with diffuse STTW changes, QTc with new TWI V3-V6. He has had prior nonspecific STTW changes V5-V6 as well as inferiorly, but the overt TWI V3-V4 is new.  He reports he has not taken his medicines for quite some time, only chewable ASA the last few days and occasional Advil. He reports chronic fatigue and being chronically sedentary which he attributes to his prostate CA. He also reports being under a lot of stress. He went for a walk on Monday and felt at baseline but had some vomiting for 10 minutes when he got home that has not recurred. Denies fever, chills, cough. He still currently smokes and reports drinking 1 shot of liquor nightly. This was previously reported as three fifths per week in the past. He is currently chest pain free  and is hopeful to be able to eat soon.    Past Medical History:  Diagnosis Date   Alcohol use    Basal ganglia infarction [facility])    Carotid artery disease (HCC)    Coronary artery disease 2011   s/p BMS stent LAD, [facility], [place], [place], Dr Kathie Rhodes. 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 [name]   CORONARY STENT INTERVENTION N/A 11/12/2017   Procedure: CORONARY STENT INTERVENTION;  Surgeon: Marykay Lex, MD;  Location: [facility] INVASIVE CV LAB;  Service: Cardiovascular;  Laterality: N/A;   LEFT HEART CATH AND CORONARY ANGIOGRAPHY N/A 11/12/2017   Procedure: LEFT HEART CATH AND CORONARY ANGIOGRAPHY;  Surgeon: Marykay Lex, MD;  Location: [facility] INVASIVE CV LAB;  Service: Cardiovascular;  Laterality: N/A;     Medications Prior to Admission: Prior to Admission medications   Medication Sig Start Date End Date Taking? Authorizing Provider  aspirin 81 MG EC tablet Take 1 tablet (81 mg total) by mouth daily. Patient not taking: Reported on 02/19/2023 12/25/19   Runell Gess, MD  atorvastatin (LIPITOR) 80 MG tablet Take 1 tablet (80 mg total) by mouth daily. 08/22/22 08/22/23  Runell Gess, MD  calcium-vitamin D (OSCAL WITH D) 500-200 MG-UNIT tablet Take 1 tablet by mouth daily with breakfast. Patient not taking: Reported on 02/19/2023 02/19/20   Jaci Standard, MD  carvedilol (COREG) 6.25 MG tablet Take 1 tablet (6.25 mg total) by mouth 2 (two) times daily with a meal. Patient not taking: Reported on 02/19/2023 08/22/22   Runell Gess, MD  clopidogrel (PLAVIX) 75 MG tablet Take 1 tablet (75 mg total) by mouth daily. Patient not taking: Reported on 02/19/2023 08/22/22   Runell Gess, MD  enzalutamide Diana Eves) 40 MG capsule Take 4 capsules (160 mg total) by mouth daily. Patient not taking: Reported on 02/19/2023 02/25/22   Jaci Standard, MD  lisinopril (ZESTRIL) 10 MG tablet Take 1 tablet (10 mg  total) by mouth daily. Patient not taking: Reported on 02/19/2023 08/22/22   Runell Gess, MD  nitroGLYCERIN (NITROSTAT) 0.4 MG SL tablet Place 1 tablet (0.4 mg total) under the tongue every 5 (five) minutes as needed for chest pain. Patient not taking: Reported on 02/19/2023 04/28/21   Runell Gess, MD     Allergies:   No Known Allergies  Social History:   Social History   Socioeconomic History   Marital status: Single    Spouse name: Not on file   Number of children: Not on file   Years of education: Not on file   Highest education level: Not on file  Occupational History   Occupation: unemployed  Tobacco Use   Smoking status: Every Day    Current packs/day: 0.25    Average packs/day: 0.3 packs/day for 45.0 years (11.3 ttl pk-yrs)  Types: Cigarettes   Smokeless tobacco: Never  Vaping Use   Vaping status: Never Used  Substance and Sexual Activity   Alcohol use: Yes    Alcohol/week: 75.0 standard drinks of alcohol    Types: 75 Shots of liquor per week    Comment: 2 shots of liquor daily   Drug use: Not Currently    Types: Marijuana    Comment: occasionally   Sexual activity: Yes  Other Topics Concern   Not on file  Social History Narrative   From [place]    Family lives in [place] (brother, sister and mother)    Unemployed    Drinks 3 (1/5)ths in 1 week   Social Drivers of Corporate investment banker Strain: Not on File (11/16/2021)   Received from [employer] Resource Strain: 0  Recent Concern: Programmer, applications - At Risk (11/16/2021)   Received from [employer]    Financial Resource Strain: 2  Food Insecurity: Not on File (11/16/2021)   Received from [place], [place], [place]   Food Insecurity    Food: 0  Recent Concern: Food Insecurity - At Risk (11/16/2021)   Received from [employer]    Food: 2  Transportation Needs: Unmet Transportation Needs (12/14/2021)   PRAPARE -  Administrator, Civil Service (Medical): Not on file    Lack of Transportation (Non-Medical): Yes  Physical Activity: Not on File (10/12/2021)   Received from [place], [place]   Physical Activity    Physical Activity: 0  Stress: Not on File (10/12/2021)   Received from [facility], [place]   Stress    Stress: 0  Social Connections: Not on File (03/04/2023)   Received from [facility]   Social Connections    Connectedness: 0  Intimate Partner Violence: Not on file    Family History:   The patient's family history includes Diabetes in his paternal grandfather; Hypertension in his paternal grandfather.    ROS:  Please see the history of present illness.  All other ROS reviewed and negative.     Physical Exam/Data:   Vitals:   07/19/23 0931 07/19/23 1100 07/19/23 1300 07/19/23 1343  BP: (!) 159/97 (!) 150/76 (!) 160/79   Pulse: 65 (!) 59 (!) 58   Resp:  16 17   Temp:    98.2 F (36.8 C)  TempSrc:    Oral  SpO2: 98% 98% 99%   Weight:      Height:       No intake or output data in the 24 hours ending 07/19/23 1553    07/19/2023    9:27 AM 05/15/2023   11:21 AM 02/19/2023   10:46 AM  Last 3 Weights  Weight (lbs) 189 lb 179 lb 14.4 oz 180 lb 9.6 oz  Weight (kg) 85.73 kg 81.602 kg 81.92 kg     Body mass index is 24.27 kg/m.  General: Well developed, well nourished AM in no acute distress. Head: Normocephalic, atraumatic, sclera non-icteric, no xanthomas, nares are without discharge. Neck: Negative for carotid bruits. JVP not elevated. Lungs: Clear bilaterally to auscultation without wheezes, rales, or rhonchi. Breathing is unlabored. Heart: RRR S1 S2 without murmurs, rubs, or gallops.  Abdomen: Soft, non-tender, non-distended with normoactive bowel sounds. No rebound/guarding. Extremities: No clubbing or cyanosis. No edema. Distal pedal pulses are 2+ and equal bilaterally. Neuro: Alert and oriented X 3. Moves all extremities spontaneously. Psych:  Responds to questions  appropriately with a normal affect.     EKG:  The EKG was personally reviewed and demonstrates:  EKG shows NSR 66bpm with diffuse STTW changes, QTc with new TWI V3-V6. He has had prior nonspecific STTW changes V5-V6 as well as inferiorly, but the overt TWI V3-V4 is new.   Telemetry:  Telemetry was personally reviewed and demonstrates:  Not on telemetry, due to ER census and hallway bed  Relevant CV Studies:  2d echo 01/2020   1. Left ventricular ejection fraction, by estimation, is 60 to 65%. The  left ventricle has normal function. The left ventricle has no regional  wall motion abnormalities. Left ventricular diastolic parameters are  consistent with Grade I diastolic  dysfunction (impaired relaxation). The average left ventricular global  longitudinal strain is -19.4 %. The global longitudinal strain is normal.   2. Right ventricular systolic function is normal. The right ventricular  size is normal. There is normal pulmonary artery systolic pressure.   3. Left atrial size was mildly dilated.   4. The mitral valve is normal in structure. Mild mitral valve  regurgitation. No evidence of mitral stenosis.   5. The aortic valve is normal in structure. Aortic valve regurgitation is  not visualized. Mild aortic valve sclerosis is present, with no evidence  of aortic valve stenosis.   6. The inferior vena cava is normal in size with greater than 50%  respiratory variability, suggesting right atrial pressure of 3 mmHg.    Cath 2019  The left ventricular systolic function is normal. LV end diastolic pressure is normal. The left ventricular ejection fraction is 55-65% by visual estimate. Ost Ramus to Ramus lesion is 80% stenosed. LAV Groove lesion is 95% stenosed. -- Plan Medical Management. CULPRIT LESIONS: Treated with 2 overlapping DES stents Mid Cx-1 lesion is 95% stenosed. Mid Cx-2 lesion is 80% stenosed. A drug-eluting stent was successfully placed using a STENT SYNERGY DES  2.5X24. - post-dilated to 3.1-2.7 mm Post intervention, there is a 0% residual stenosis. Prox Cx-1 lesion is 70% stenosed. Prox Cx-2 lesion is 80% stenosed. (overlapping proximally) A bare metal stent was successfully placed using a STENT SYNERGY DES 2.5X24. - post-dilated to 3.1 mm Post intervention, there is a 0% residual stenosis.   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 Small caliber Ramus & AV Groove Cx vessels with 80% & 95% lesions to be treated medically. Mild ISR of pLAD stent. Normal EF.    Laboratory Data:  High Sensitivity Troponin:   Recent Labs  Lab 07/19/23 0936 07/19/23 1149  TROPONINIHS 45* 47*      Chemistry Recent Labs  Lab 07/19/23 0936  NA 138  K 4.1  CL 107  CO2 23  GLUCOSE 99  BUN 17  CREATININE 1.43*  CALCIUM 9.6  GFRNONAA 54*  ANIONGAP 8    No results for input(s): "PROT", "ALBUMIN", "AST", "ALT", "ALKPHOS", "BILITOT" in the last 168 hours. Lipids No results for input(s): "CHOL", "TRIG", "HDL", "LABVLDL", "LDLCALC", "CHOLHDL" in the last 168 hours. Hematology Recent Labs  Lab 07/19/23 0936  WBC 6.8  RBC 4.31  HGB 11.9*  HCT 37.2*  MCV 86.3  MCH 27.6  MCHC 32.0  RDW 13.8  PLT 187   Thyroid No results for input(s): "TSH", "FREET4" in the last 168 hours. BNPNo results for input(s): "BNP", "PROBNP" in the last 168 hours.  DDimer No results for input(s): "DDIMER" in the last 168 hours.   Radiology/Studies:  DG Chest 2 View Result Date: 07/19/2023 CLINICAL DATA:  Shortness of breath. EXAM: CHEST - 2 VIEW COMPARISON:  Chest radiograph dated December 11, 2019. CTA chest abdomen and pelvis dated February 18, 2023. FINDINGS: The heart size and mediastinal contours are within normal limits. Aortic atherosclerosis. No focal consolidation, pleural effusion, or pneumothorax. No acute osseous abnormality. IMPRESSION: No acute cardiopulmonary findings. Electronically Signed   By: Hart Robinsons M.D.   On:  07/19/2023 10:06     Assessment and Plan:   1. Chest pain/SOB with mixed features, mild low level troponin elevation, abnormal EKG - hsTroponins low/flat at 45-47, EKG with new TWI in the anterior leads - symptoms with mixed features, arriving in the context of no current medical therapy for HTN and CAD - unclear if this represents progression of CAD versus demand ischemia in setting of high blood pressure. He is not tachycardic, tachypneic or hypoxic and chest pain has resolved for the time being - d/w Dr. Allyson Sabal who recommends proceeding with left heart cath on Monday for further evaluation (scheduled 9am on Monday, orders written) - will get echocardiogram - - if LVEF is down, may need to revise pre-cath fluid orders - Dr. Allyson Sabal recommends holding off full dose heparin since enzymes flat and currently pain free, can revisit if pain recurs - add back ASA 324mg  x1 today then 81mg  daily, hold off Plavix pending cath results - add back atorvastatin 80mg  daily - add prophylactic PPI  Informed Consent   Shared Decision Making/Informed Consent The risks [stroke (1 in 1000), death (1 in 1000), kidney failure [usually temporary] (1 in 500), bleeding (1 in 200), allergic reaction [possibly serious] (1 in 200)], benefits (diagnostic support and management of coronary artery disease) and alternatives of a cardiac catheterization were discussed in detail with Mr. Hoctor and he is willing to proceed.     2. Essential HTN, uncontrolled with medication noncompliance - previously intended to be on carvedilol and lisinopril, would try to minimize number of tablets having to take - given fatigue and baseline sinus bradycardia in the 50s, hold off carvedilol - given CKD and plan for cath, hold off lisinopril - add amlodipine 5mg  daily   3. Mild MR by echo 2021 - repeat echo   4. HLD - last lipid panel 2023 with trig 307, LDL 179 in setting of missing medications - recommend restart atorvastatin 80mg   with OP f/u  5. CKD 3a - appears at baseline, follow with plan above  6. Mild anemia - Hgb appears at baseline - check anemia panel in AM given fatigue - add hemoccult  7. Carotid artery disease by duplex 08/2022, mild dilation of infrarenal arteries/bilateral common iliac arteries by CT 01/2023 - no interim neuro symptoms - outpatient follow-up  8. 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      Code Status: Full Code  Severity of Illness: The appropriate patient status for this patient is INPATIENT. Inpatient status is judged to be reasonable and necessary in order to provide the required intensity of service to ensure the patient's safety. The patient's presenting symptoms, physical exam findings, and initial radiographic and laboratory data in the context of their chronic comorbidities is felt to place them at high risk for further clinical deterioration. Furthermore, it is not anticipated that the patient will be medically stable for discharge from the hospital within 2 midnights of admission.   * I certify that at the point of admission it is my clinical judgment that the patient will require inpatient hospital care spanning beyond 2 midnights from the point of admission due to high intensity of  service, high risk for further deterioration and high frequency of surveillance required.*   For questions or updates, please contact [facility] Please consult [email or web address] for contact info under     Signed, [place], PA-C  07/19/2023 3:53 PM    Agree with findings by Ronie Spies PA-C  Patient well-known to me from the office.  I last saw him 1 year ago.  He has a history of intervention back in 2009 in 2019.  Other problems as outlined.  He has had shortness of breath and chest pain off and on for the last 2 weeks worse last night.  Is currently pain-free.  Exam is benign.  His enzymes are negative.  His EKG did show new anterolateral T wave inversion.  He has moderate renal insufficiency.  Based on this I favor diagnostic coronary angiography.  He does not meet criteria for starting IV heparin.  Will plan cath on Monday.  Runell Gess, M.D., FACP, [facility], [facility] [address] [place], [place]  [address]  [phone] 07/19/2023 5:10 PM

## 2023-07-20 DIAGNOSIS — I10 Essential (primary) hypertension: Secondary | ICD-10-CM

## 2023-07-20 DIAGNOSIS — R079 Chest pain, unspecified: Secondary | ICD-10-CM

## 2023-07-20 DIAGNOSIS — D649 Anemia, unspecified: Secondary | ICD-10-CM

## 2023-07-20 DIAGNOSIS — E785 Hyperlipidemia, unspecified: Secondary | ICD-10-CM | POA: Diagnosis not present

## 2023-07-20 LAB — COMPREHENSIVE METABOLIC PANEL
ALT: 16 U/L (ref 0–44)
AST: 19 U/L (ref 15–41)
Albumin: 3.6 g/dL (ref 3.5–5.0)
Alkaline Phosphatase: 57 U/L (ref 38–126)
Anion gap: 8 (ref 5–15)
BUN: 22 mg/dL (ref 8–23)
CO2: 24 mmol/L (ref 22–32)
Calcium: 9.2 mg/dL (ref 8.9–10.3)
Chloride: 103 mmol/L (ref 98–111)
Creatinine, Ser: 1.51 mg/dL — ABNORMAL HIGH (ref 0.61–1.24)
GFR, Estimated: 51 mL/min — ABNORMAL LOW (ref >60)
Glucose, Bld: 98 mg/dL (ref 70–99)
Potassium: 4.4 mmol/L (ref 3.5–5.1)
Sodium: 135 mmol/L (ref 135–145)
Total Bilirubin: 0.6 mg/dL (ref 0.0–1.2)
Total Protein: 7 g/dL (ref 6.5–8.1)

## 2023-07-20 LAB — VITAMIN B12: Vitamin B-12: 536 pg/mL (ref 180–914)

## 2023-07-20 LAB — RETICULOCYTES
Immature Retic Fract: 6.5 % (ref 2.3–15.9)
RBC.: 4.05 MIL/uL — ABNORMAL LOW (ref 4.22–5.81)
Retic Count, Absolute: 42.1 10*3/uL (ref 19.0–186.0)
Retic Ct Pct: 1 % (ref 0.4–3.1)

## 2023-07-20 LAB — CBC
HCT: 34.8 % — ABNORMAL LOW (ref 39.0–52.0)
Hemoglobin: 11 g/dL — ABNORMAL LOW (ref 13.0–17.0)
MCH: 27.1 pg (ref 26.0–34.0)
MCHC: 31.6 g/dL (ref 30.0–36.0)
MCV: 85.7 fL (ref 80.0–100.0)
Platelets: 170 10*3/uL (ref 150–400)
RBC: 4.06 MIL/uL — ABNORMAL LOW (ref 4.22–5.81)
RDW: 14 % (ref 11.5–15.5)
WBC: 6.8 10*3/uL (ref 4.0–10.5)
nRBC: 0 % (ref 0.0–0.2)

## 2023-07-20 LAB — IRON AND TIBC
Iron: 54 ug/dL (ref 45–182)
Saturation Ratios: 19 % (ref 17.9–39.5)
TIBC: 280 ug/dL (ref 250–450)
UIBC: 226 ug/dL

## 2023-07-20 LAB — LIPID PANEL
Cholesterol: 267 mg/dL — ABNORMAL HIGH (ref 0–200)
HDL: 32 mg/dL — ABNORMAL LOW (ref >40)
LDL Cholesterol: 196 mg/dL — ABNORMAL HIGH (ref 0–99)
Total CHOL/HDL Ratio: 8.3 {ratio}
Triglycerides: 194 mg/dL — ABNORMAL HIGH (ref <150)
VLDL: 39 mg/dL (ref 0–40)

## 2023-07-20 LAB — FERRITIN: Ferritin: 250 ng/mL (ref 24–336)

## 2023-07-20 LAB — FOLATE: Folate: 22.5 ng/mL (ref >5.9)

## 2023-07-20 LAB — TSH: TSH: 1.19 u[IU]/mL (ref 0.350–4.500)

## 2023-07-20 NOTE — Progress Notes (Signed)
Rounding Note    Patient Name: [name] [name] Date of Encounter: 07/20/2023  [facility] Cardiologist: Nanetta Batty, MD   Subjective   BP 148/70.  Cr stable at 1.51.  Denies any chest pain currently.  Inpatient Medications    Scheduled Meds:  amLODipine  5 mg Oral Daily   aspirin EC  81 mg Oral Daily   atorvastatin  80 mg Oral Daily   folic acid  1 mg Oral Daily   LORazepam  0-4 mg Oral Q6H   Followed by   Melene Muller ON 07/21/2023] LORazepam  0-4 mg Oral Q12H   multivitamin with minerals  1 tablet Oral Daily   pantoprazole  40 mg Oral Q0600   thiamine  100 mg Oral Daily   Or   thiamine  100 mg Intravenous Daily   Continuous Infusions:  PRN Meds: LORazepam **OR** LORazepam   Vital Signs    Vitals:   07/19/23 2200 07/19/23 2342 07/20/23 0429 07/20/23 0600  BP: (!) 152/73  (!) 153/67 (!) 148/70  Pulse: 61     Resp: 15 12 16 16   Temp: 98.5 F (36.9 C) 98.5 F (36.9 C) 98.2 F (36.8 C)   TempSrc: Oral Oral Oral   SpO2: 96% 97% 97% 98%  Weight:      Height:        Intake/Output Summary (Last 24 hours) at 07/20/2023 0906 Last data filed at 07/20/2023 0600 Gross per 24 hour  Intake 150 ml  Output 450 ml  Net -300 ml      07/19/2023    9:32 PM 07/19/2023    9:27 AM 05/15/2023   11:21 AM  Last 3 Weights  Weight (lbs) 177 lb 0.5 oz 189 lb 179 lb 14.4 oz  Weight (kg) 80.3 kg 85.73 kg 81.602 kg      Telemetry    Normal sinus rhythm- Personally Reviewed  ECG    No new ECG- Personally Reviewed  Physical Exam   GEN: No acute distress.   Neck: No JVD Cardiac: RRR, no murmurs, rubs, or gallops.  Respiratory: Clear to auscultation bilaterally. GI: Soft, nontender, non-distended  MS: No edema; No deformity. Neuro:  Nonfocal  Psych: Normal affect   Labs    High Sensitivity Troponin:   Recent Labs  Lab 07/19/23 0936 07/19/23 1149  TROPONINIHS 45* 47*     Chemistry Recent Labs  Lab 07/19/23 0936 07/20/23 0213  NA 138 135  K 4.1  4.4  CL 107 103  CO2 23 24  GLUCOSE 99 98  BUN 17 22  CREATININE 1.43* 1.51*  CALCIUM 9.6 9.2  PROT  --  7.0  ALBUMIN  --  3.6  AST  --  19  ALT  --  16  ALKPHOS  --  57  BILITOT  --  0.6  GFRNONAA 54* 51*  ANIONGAP 8 8    Lipids  Recent Labs  Lab 07/20/23 0213  CHOL 267*  TRIG 194*  HDL 32*  LDLCALC 196*  CHOLHDL 8.3    Hematology Recent Labs  Lab 07/19/23 0936 07/20/23 0213  WBC 6.8 6.8  RBC 4.31 4.06*  4.05*  HGB 11.9* 11.0*  HCT 37.2* 34.8*  MCV 86.3 85.7  MCH 27.6 27.1  MCHC 32.0 31.6  RDW 13.8 14.0  PLT 187 170   Thyroid  Recent Labs  Lab 07/20/23 0213  TSH 1.190    BNPNo results for input(s): "BNP", "PROBNP" in the last 168 hours.  DDimer No results for input(s): "  DDIMER" in the last 168 hours.   Radiology    DG Chest 2 View Result Date: 07/19/2023 CLINICAL DATA:  Shortness of breath. EXAM: CHEST - 2 VIEW COMPARISON:  Chest radiograph dated December 11, 2019. CTA chest abdomen and pelvis dated February 18, 2023. FINDINGS: The heart size and mediastinal contours are within normal limits. Aortic atherosclerosis. No focal consolidation, pleural effusion, or pneumothorax. No acute osseous abnormality. IMPRESSION: No acute cardiopulmonary findings. Electronically Signed   By: Hart Robinsons M.D.   On: 07/19/2023 10:06    Cardiac Studies     Patient Profile     68 y.o. 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  Chest pain/SOB with mixed features, mild low level troponin elevation, abnormal EKG - hsTroponins low/flat at 45-47, EKG with new TWI in the anterior leads - symptoms with mixed features, arriving in the context of no current medical therapy for HTN and CAD - unclear if this represents progression of CAD versus demand ischemia in setting of high blood pressure. He is not tachycardic, tachypneic or hypoxic and chest pain has resolved for the time being - evaluated by Dr Allyson Sabal on 1/24 (who is his outpatient cardiologist) and recommends proceeding with left heart cath on Monday for further evaluation (scheduled 9am on Monday, orders written) - will get echocardiogram - - if LVEF is down, may need to revise pre-cath fluid orders - holding off full dose heparin since enzymes flat and currently pain free, can revisit if pain recurs - continue ASA 81mg  daily, hold off Plavix pending cath results - restarted atorvastatin 80mg  daily. - add prophylactic PPI     2. Essential HTN, uncontrolled with medication noncompliance - previously intended to be on carvedilol and lisinopril, would try to minimize number of tablets having to take - given fatigue and baseline sinus bradycardia in the 50s, hold off carvedilol - given CKD and plan for cath, hold off lisinopril - add amlodipine 5mg  daily   3. Mild MR by echo 2021 - repeat echo   4. HLD - last lipid panel 2023 with trig 307, LDL 179 in setting of missing medications - recommend restart atorvastatin 80mg  with OP f/u.  LDL 196 this admission   5. CKD 3a - appears at baseline, follow with plan above   6. Mild anemia - Hgb appears at baseline - check anemia panel in AM given fatigue - add hemoccult   7. Carotid artery disease by duplex 08/2022, mild dilation of infrarenal arteries/bilateral common iliac arteries by CT 01/2023 - no interim neuro symptoms - outpatient follow-up   8. 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

## 2023-07-20 NOTE — Plan of Care (Signed)
  Problem: Education: Goal: Knowledge of General Education information will improve Description: Including pain rating scale, medication(s)/side effects and non-pharmacologic comfort measures Outcome: Progressing   Problem: Health Behavior/Discharge Planning: Goal: Ability to manage health-related needs will improve Outcome: Progressing   Problem: Clinical Measurements: Goal: Ability to maintain clinical measurements within normal limits will improve Outcome: Progressing Goal: Will remain free from infection Outcome: Progressing   Problem: Clinical Measurements: Goal: Ability to maintain clinical measurements within normal limits will improve Outcome: Progressing   Problem: Clinical Measurements: Goal: Will remain free from infection Outcome: Progressing

## 2023-07-21 DIAGNOSIS — D649 Anemia, unspecified: Secondary | ICD-10-CM | POA: Diagnosis not present

## 2023-07-21 DIAGNOSIS — I10 Essential (primary) hypertension: Secondary | ICD-10-CM | POA: Diagnosis not present

## 2023-07-21 DIAGNOSIS — E785 Hyperlipidemia, unspecified: Secondary | ICD-10-CM | POA: Diagnosis not present

## 2023-07-21 DIAGNOSIS — R079 Chest pain, unspecified: Secondary | ICD-10-CM | POA: Diagnosis not present

## 2023-07-21 LAB — BASIC METABOLIC PANEL
Anion gap: 8 (ref 5–15)
BUN: 22 mg/dL (ref 8–23)
CO2: 25 mmol/L (ref 22–32)
Calcium: 9.5 mg/dL (ref 8.9–10.3)
Chloride: 106 mmol/L (ref 98–111)
Creatinine, Ser: 1.51 mg/dL — ABNORMAL HIGH (ref 0.61–1.24)
GFR, Estimated: 51 mL/min — ABNORMAL LOW (ref >60)
Glucose, Bld: 110 mg/dL — ABNORMAL HIGH (ref 70–99)
Potassium: 4.3 mmol/L (ref 3.5–5.1)
Sodium: 139 mmol/L (ref 135–145)

## 2023-07-21 LAB — CBC
HCT: 35.6 % — ABNORMAL LOW (ref 39.0–52.0)
HCT: 37.3 % — ABNORMAL LOW (ref 39.0–52.0)
Hemoglobin: 11.3 g/dL — ABNORMAL LOW (ref 13.0–17.0)
Hemoglobin: 11.9 g/dL — ABNORMAL LOW (ref 13.0–17.0)
MCH: 27.1 pg (ref 26.0–34.0)
MCH: 27.4 pg (ref 26.0–34.0)
MCHC: 31.7 g/dL (ref 30.0–36.0)
MCHC: 31.9 g/dL (ref 30.0–36.0)
MCV: 85.4 fL (ref 80.0–100.0)
MCV: 85.9 fL (ref 80.0–100.0)
Platelets: 194 10*3/uL (ref 150–400)
Platelets: 189 10*3/uL (ref 150–400)
RBC: 4.17 MIL/uL — ABNORMAL LOW (ref 4.22–5.81)
RBC: 4.34 MIL/uL (ref 4.22–5.81)
RDW: 13.8 % (ref 11.5–15.5)
RDW: 13.9 % (ref 11.5–15.5)
WBC: 6.8 10*3/uL (ref 4.0–10.5)
WBC: 6.7 10*3/uL (ref 4.0–10.5)
nRBC: 0 % (ref 0.0–0.2)
nRBC: 0 % (ref 0.0–0.2)

## 2023-07-21 LAB — CREATININE, SERUM
Creatinine, Ser: 1.58 mg/dL — ABNORMAL HIGH (ref 0.61–1.24)
GFR, Estimated: 48 mL/min — ABNORMAL LOW (ref >60)

## 2023-07-21 LAB — HIV ANTIBODY (ROUTINE TESTING W REFLEX): HIV Screen 4th Generation wRfx: NONREACTIVE

## 2023-07-21 LAB — BRAIN NATRIURETIC PEPTIDE: B Natriuretic Peptide: 18.9 pg/mL (ref 0.0–100.0)

## 2023-07-21 MED ORDER — SODIUM CHLORIDE 0.9 % WEIGHT BASED INFUSION: 85.0000 mL/h | INTRAVENOUS | Status: DC

## 2023-07-21 MED ORDER — SODIUM CHLORIDE 0.9 % WEIGHT BASED INFUSION
250.0000 mL/h | INTRAVENOUS | Status: DC
Start: 2023-07-22 — End: 2023-07-22
  Administered 2023-07-22: 250 mL/h via INTRAVENOUS

## 2023-07-21 MED ORDER — HEPARIN SODIUM (PORCINE) 5000 UNIT/ML IJ SOLN
5000.0000 [IU] | Freq: Three times a day (TID) | INTRAMUSCULAR | Status: DC
  Administered 2023-07-21 – 2023-07-22 (×2): 5000 [IU] via SUBCUTANEOUS
  Filled 2023-07-21 (×2): qty 1

## 2023-07-21 MED ORDER — ONDANSETRON HCL 4 MG/2ML IJ SOLN: 4.0000 mg | Freq: Four times a day (QID) | INTRAMUSCULAR | Status: DC | PRN

## 2023-07-21 MED ORDER — SODIUM CHLORIDE 0.9% FLUSH: 3.0000 mL | INTRAVENOUS | Status: DC | PRN

## 2023-07-21 MED ORDER — ACETAMINOPHEN 325 MG PO TABS
650.0000 mg | ORAL_TABLET | ORAL | Status: DC | PRN
  Administered 2023-07-22: 650 mg via ORAL
  Filled 2023-07-21: qty 2

## 2023-07-21 MED ORDER — ASPIRIN 81 MG PO CHEW
81.0000 mg | CHEWABLE_TABLET | ORAL | Status: AC
  Administered 2023-07-22: 81 mg via ORAL
  Filled 2023-07-21: qty 1

## 2023-07-21 MED ORDER — SODIUM CHLORIDE 0.9% FLUSH
3.0000 mL | Freq: Two times a day (BID) | INTRAVENOUS | Status: DC
  Administered 2023-07-22 – 2023-07-23 (×3): 3 mL via INTRAVENOUS

## 2023-07-21 MED ORDER — SODIUM CHLORIDE 0.9 % IV SOLN: 250.0000 mL | INTRAVENOUS | Status: AC | PRN

## 2023-07-21 MED ORDER — NITROGLYCERIN 0.4 MG SL SUBL: 0.4000 mg | SUBLINGUAL_TABLET | SUBLINGUAL | Status: DC | PRN

## 2023-07-21 MED ORDER — AMLODIPINE BESYLATE 10 MG PO TABS
10.0000 mg | ORAL_TABLET | Freq: Every day | ORAL | Status: DC
  Administered 2023-07-21 – 2023-07-23 (×3): 10 mg via ORAL
  Filled 2023-07-21 (×3): qty 1

## 2023-07-21 NOTE — Plan of Care (Signed)
  Problem: Education: Goal: Knowledge of General Education information will improve Description: Including pain rating scale, medication(s)/side effects and non-pharmacologic comfort measures Outcome: Progressing   Problem: Health Behavior/Discharge Planning: Goal: Ability to manage health-related needs will improve Outcome: Progressing   Problem: Clinical Measurements: Goal: Respiratory complications will improve Outcome: Progressing   Problem: Nutrition: Goal: Adequate nutrition will be maintained Outcome: Progressing   Problem: Coping: Goal: Level of anxiety will decrease Outcome: Progressing   Problem: Elimination: Goal: Will not experience complications related to bowel motility Outcome: Progressing Goal: Will not experience complications related to urinary retention Outcome: Progressing   Problem: Pain Managment: Goal: General experience of comfort will improve and/or be controlled Outcome: Progressing   Problem: Safety: Goal: Ability to remain free from injury will improve Outcome: Progressing

## 2023-07-21 NOTE — Progress Notes (Signed)
Rounding Note    Patient Name: [name] [name] Date of Encounter: 07/21/2023  [facility] Cardiologist: Nanetta Batty, MD   Subjective   BP 140/100.  Cr stable at 1.51.  Denies any chest pain currently.  Reports dyspnea when he exerts himself but not at rest  Inpatient Medications    Scheduled Meds:  amLODipine  5 mg Oral Daily   aspirin EC  81 mg Oral Daily   atorvastatin  80 mg Oral Daily   folic acid  1 mg Oral Daily   LORazepam  0-4 mg Oral Q6H   Followed by   LORazepam  0-4 mg Oral Q12H   multivitamin with minerals  1 tablet Oral Daily   pantoprazole  40 mg Oral Q0600   thiamine  100 mg Oral Daily   Or   thiamine  100 mg Intravenous Daily   Continuous Infusions:  PRN Meds: LORazepam **OR** LORazepam   Vital Signs    Vitals:   07/20/23 2019 07/21/23 0038 07/21/23 0333 07/21/23 0713  BP: (!) 176/114 (!) 143/81 139/87 (!) 140/101  Pulse:  (!) 56 (!) 57 82  Resp: 15 18 16    Temp: 98 F (36.7 C) 98.6 F (37 C) 98.5 F (36.9 C) 98.2 F (36.8 C)  TempSrc:  Oral Oral Oral  SpO2: 97% 99% 96%   Weight:      Height:        Intake/Output Summary (Last 24 hours) at 07/21/2023 0946 Last data filed at 07/21/2023 0713 Gross per 24 hour  Intake --  Output 750 ml  Net -750 ml      07/19/2023    9:32 PM 07/19/2023    9:27 AM 05/15/2023   11:21 AM  Last 3 Weights  Weight (lbs) 177 lb 0.5 oz 189 lb 179 lb 14.4 oz  Weight (kg) 80.3 kg 85.73 kg 81.602 kg      Telemetry    Normal sinus rhythm- Personally Reviewed  ECG    No new ECG- Personally Reviewed  Physical Exam   GEN: No acute distress.   Neck: No JVD Cardiac: RRR, no murmurs, rubs, or gallops.  Respiratory: Clear to auscultation bilaterally. GI: Soft, nontender, non-distended  MS: No edema; No deformity. Neuro:  Nonfocal  Psych: Normal affect   Labs    High Sensitivity Troponin:   Recent Labs  Lab 07/19/23 0936 07/19/23 1149  TROPONINIHS 45* 47*     Chemistry Recent Labs   Lab 07/19/23 0936 07/20/23 0213 07/21/23 0211  NA 138 135 139  K 4.1 4.4 4.3  CL 107 103 106  CO2 23 24 25   GLUCOSE 99 98 110*  BUN 17 22 22   CREATININE 1.43* 1.51* 1.51*  CALCIUM 9.6 9.2 9.5  PROT  --  7.0  --   ALBUMIN  --  3.6  --   AST  --  19  --   ALT  --  16  --   ALKPHOS  --  57  --   BILITOT  --  0.6  --   GFRNONAA 54* 51* 51*  ANIONGAP 8 8 8     Lipids  Recent Labs  Lab 07/20/23 0213  CHOL 267*  TRIG 194*  HDL 32*  LDLCALC 196*  CHOLHDL 8.3    Hematology Recent Labs  Lab 07/19/23 0936 07/20/23 0213 07/21/23 0211  WBC 6.8 6.8 6.8  RBC 4.31 4.06*  4.05* 4.17*  HGB 11.9* 11.0* 11.3*  HCT 37.2* 34.8* 35.6*  MCV 86.3 85.7 85.4  MCH 27.6 27.1 27.1  MCHC 32.0 31.6 31.7  RDW 13.8 14.0 13.8  PLT 187 170 194   Thyroid  Recent Labs  Lab 07/20/23 0213  TSH 1.190    BNPNo results for input(s): "BNP", "PROBNP" in the last 168 hours.  DDimer No results for input(s): "DDIMER" in the last 168 hours.   Radiology    No results found.   Cardiac Studies     Patient Profile     68 y.o. 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 Chest pain/SOB with mixed features, mild low level troponin elevation, abnormal EKG - hsTroponins low/flat at 45-47, EKG with new TWI in the anterior leads - symptoms with mixed features, arriving in the context of no current medical therapy for HTN and CAD - unclear if this represents  progression of CAD versus demand ischemia in setting of high blood pressure. He is not tachycardic, tachypneic or hypoxic and chest pain has resolved for the time being - evaluated by Dr Allyson Sabal on 1/24 (who is his outpatient cardiologist) and recommends proceeding with left heart cath on Monday for further evaluation (scheduled 9am on Monday, orders written) - will get echocardiogram  - holding off full dose heparin since enzymes flat and currently pain free, can revisit if pain recurs - continue ASA 81mg  daily, hold off Plavix pending cath results - restarted atorvastatin 80mg  daily. - add prophylactic PPI  Risks and benefits of cardiac catheterization have been discussed with the patient.  These include bleeding, infection, kidney damage, stroke, heart attack, death.  The patient understands these risks and is willing to proceed.      2. Essential HTN, uncontrolled with medication noncompliance - previously intended to be on carvedilol and lisinopril, would try to minimize number of tablets having to take - given fatigue and baseline sinus bradycardia in the 50s, hold off carvedilol - given CKD and plan for cath, hold off lisinopril - added amlodipine, BP remains elevated will increase to 10 mg daily   3. Mild MR by echo 2021 - repeat echo   4. HLD - last lipid panel 2023 with trig 307, LDL 179 in setting of missing medications - recommend restart atorvastatin 80mg  with OP f/u.  LDL 196 this admission   5. CKD 3a - appears at baseline, follow with plan above   6. Mild anemia - Hgb appears at baseline   7. Carotid artery disease by duplex 08/2022, mild dilation of infrarenal arteries/bilateral common iliac arteries by CT 01/2023 - no interim neuro symptoms - outpatient follow-up   8. ETOH/tobacco use - patient has prior documentation of drinking average of 3 fifth's per week, currently reports only drinking 1 shot nightly, drinks 2 cigarettes per day - given historical ETOH use,  will use CIWA protocol - cessation advised   Ancillary issues needing OP f/u - brain meningioma with unclear plan of care (saw neurosurgery 04/2023 unable to review full note) - prostate CA metastatic to  bone - anticipate resumption of home regimen at dc  For questions or updates, please contact [facility] Please consult [email or web address] for contact info under        Signed, Little Ishikawa, MD  07/21/2023, 9:46 AM

## 2023-07-21 NOTE — H&P (View-Only) (Signed)
Rounding Note    Patient Name: [name] [name] Date of Encounter: 07/21/2023  [facility] Cardiologist: Nanetta Batty, MD   Subjective   BP 140/100.  Cr stable at 1.51.  Denies any chest pain currently.  Reports dyspnea when he exerts himself but not at rest  Inpatient Medications    Scheduled Meds:  amLODipine  5 mg Oral Daily   aspirin EC  81 mg Oral Daily   atorvastatin  80 mg Oral Daily   folic acid  1 mg Oral Daily   LORazepam  0-4 mg Oral Q6H   Followed by   LORazepam  0-4 mg Oral Q12H   multivitamin with minerals  1 tablet Oral Daily   pantoprazole  40 mg Oral Q0600   thiamine  100 mg Oral Daily   Or   thiamine  100 mg Intravenous Daily   Continuous Infusions:  PRN Meds: LORazepam **OR** LORazepam   Vital Signs    Vitals:   07/20/23 2019 07/21/23 0038 07/21/23 0333 07/21/23 0713  BP: (!) 176/114 (!) 143/81 139/87 (!) 140/101  Pulse:  (!) 56 (!) 57 82  Resp: 15 18 16    Temp: 98 F (36.7 C) 98.6 F (37 C) 98.5 F (36.9 C) 98.2 F (36.8 C)  TempSrc:  Oral Oral Oral  SpO2: 97% 99% 96%   Weight:      Height:        Intake/Output Summary (Last 24 hours) at 07/21/2023 0946 Last data filed at 07/21/2023 0713 Gross per 24 hour  Intake --  Output 750 ml  Net -750 ml      07/19/2023    9:32 PM 07/19/2023    9:27 AM 05/15/2023   11:21 AM  Last 3 Weights  Weight (lbs) 177 lb 0.5 oz 189 lb 179 lb 14.4 oz  Weight (kg) 80.3 kg 85.73 kg 81.602 kg      Telemetry    Normal sinus rhythm- Personally Reviewed  ECG    No new ECG- Personally Reviewed  Physical Exam   GEN: No acute distress.   Neck: No JVD Cardiac: RRR, no murmurs, rubs, or gallops.  Respiratory: Clear to auscultation bilaterally. GI: Soft, nontender, non-distended  MS: No edema; No deformity. Neuro:  Nonfocal  Psych: Normal affect   Labs    High Sensitivity Troponin:   Recent Labs  Lab 07/19/23 0936 07/19/23 1149  TROPONINIHS 45* 47*     Chemistry Recent Labs   Lab 07/19/23 0936 07/20/23 0213 07/21/23 0211  NA 138 135 139  K 4.1 4.4 4.3  CL 107 103 106  CO2 23 24 25   GLUCOSE 99 98 110*  BUN 17 22 22   CREATININE 1.43* 1.51* 1.51*  CALCIUM 9.6 9.2 9.5  PROT  --  7.0  --   ALBUMIN  --  3.6  --   AST  --  19  --   ALT  --  16  --   ALKPHOS  --  57  --   BILITOT  --  0.6  --   GFRNONAA 54* 51* 51*  ANIONGAP 8 8 8     Lipids  Recent Labs  Lab 07/20/23 0213  CHOL 267*  TRIG 194*  HDL 32*  LDLCALC 196*  CHOLHDL 8.3    Hematology Recent Labs  Lab 07/19/23 0936 07/20/23 0213 07/21/23 0211  WBC 6.8 6.8 6.8  RBC 4.31 4.06*  4.05* 4.17*  HGB 11.9* 11.0* 11.3*  HCT 37.2* 34.8* 35.6*  MCV 86.3 85.7 85.4  MCH 27.6 27.1 27.1  MCHC 32.0 31.6 31.7  RDW 13.8 14.0 13.8  PLT 187 170 194   Thyroid  Recent Labs  Lab 07/20/23 0213  TSH 1.190    BNPNo results for input(s): "BNP", "PROBNP" in the last 168 hours.  DDimer No results for input(s): "DDIMER" in the last 168 hours.   Radiology    No results found.   Cardiac Studies     Patient Profile     68 y.o. 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 Chest pain/SOB with mixed features, mild low level troponin elevation, abnormal EKG - hsTroponins low/flat at 45-47, EKG with new TWI in the anterior leads - symptoms with mixed features, arriving in the context of no current medical therapy for HTN and CAD - unclear if this represents  progression of CAD versus demand ischemia in setting of high blood pressure. He is not tachycardic, tachypneic or hypoxic and chest pain has resolved for the time being - evaluated by Dr Allyson Sabal on 1/24 (who is his outpatient cardiologist) and recommends proceeding with left heart cath on Monday for further evaluation (scheduled 9am on Monday, orders written) - will get echocardiogram  - holding off full dose heparin since enzymes flat and currently pain free, can revisit if pain recurs - continue ASA 81mg  daily, hold off Plavix pending cath results - restarted atorvastatin 80mg  daily. - add prophylactic PPI  Risks and benefits of cardiac catheterization have been discussed with the patient.  These include bleeding, infection, kidney damage, stroke, heart attack, death.  The patient understands these risks and is willing to proceed.      2. Essential HTN, uncontrolled with medication noncompliance - previously intended to be on carvedilol and lisinopril, would try to minimize number of tablets having to take - given fatigue and baseline sinus bradycardia in the 50s, hold off carvedilol - given CKD and plan for cath, hold off lisinopril - added amlodipine, BP remains elevated will increase to 10 mg daily   3. Mild MR by echo 2021 - repeat echo   4. HLD - last lipid panel 2023 with trig 307, LDL 179 in setting of missing medications - recommend restart atorvastatin 80mg  with OP f/u.  LDL 196 this admission   5. CKD 3a - appears at baseline, follow with plan above   6. Mild anemia - Hgb appears at baseline   7. Carotid artery disease by duplex 08/2022, mild dilation of infrarenal arteries/bilateral common iliac arteries by CT 01/2023 - no interim neuro symptoms - outpatient follow-up   8. ETOH/tobacco use - patient has prior documentation of drinking average of 3 fifth's per week, currently reports only drinking 1 shot nightly, drinks 2 cigarettes per day - given historical ETOH use,  will use CIWA protocol - cessation advised   Ancillary issues needing OP f/u - brain meningioma with unclear plan of care (saw neurosurgery 04/2023 unable to review full note) - prostate CA metastatic to  bone - anticipate resumption of home regimen at dc  For questions or updates, please contact [facility] Please consult [email or web address] for contact info under        Signed, Little Ishikawa, MD  07/21/2023, 9:46 AM

## 2023-07-22 ENCOUNTER — Inpatient Hospital Stay (HOSPITAL_COMMUNITY): Payer: Medicare Other

## 2023-07-22 ENCOUNTER — Encounter (HOSPITAL_COMMUNITY)
Admission: EM | Disposition: A | Discharge status: Home / Self Care | Payer: Self-pay | Source: Home / Self Care | Attending: Cardiovascular Disease

## 2023-07-22 ENCOUNTER — Encounter (HOSPITAL_COMMUNITY): Payer: Self-pay | Admitting: Internal Medicine

## 2023-07-22 ENCOUNTER — Other Ambulatory Visit (HOSPITAL_COMMUNITY): Payer: Medicare Other

## 2023-07-22 DIAGNOSIS — R079 Chest pain, unspecified: Secondary | ICD-10-CM

## 2023-07-22 DIAGNOSIS — I2510 Atherosclerotic heart disease of native coronary artery without angina pectoris: Secondary | ICD-10-CM

## 2023-07-22 DIAGNOSIS — I214 Non-ST elevation (NSTEMI) myocardial infarction: Secondary | ICD-10-CM | POA: Diagnosis not present

## 2023-07-22 HISTORY — PX: LEFT HEART CATH AND CORONARY ANGIOGRAPHY: CATH118249

## 2023-07-22 HISTORY — PX: CORONARY STENT INTERVENTION: CATH118234

## 2023-07-22 HISTORY — PX: CORONARY ULTRASOUND/IVUS: CATH118244

## 2023-07-22 LAB — BASIC METABOLIC PANEL
Anion gap: 9 (ref 5–15)
BUN: 25 mg/dL — ABNORMAL HIGH (ref 8–23)
CO2: 24 mmol/L (ref 22–32)
Calcium: 9.4 mg/dL (ref 8.9–10.3)
Chloride: 102 mmol/L (ref 98–111)
Creatinine, Ser: 1.54 mg/dL — ABNORMAL HIGH (ref 0.61–1.24)
GFR, Estimated: 50 mL/min — ABNORMAL LOW (ref >60)
Glucose, Bld: 98 mg/dL (ref 70–99)
Potassium: 4.2 mmol/L (ref 3.5–5.1)
Sodium: 135 mmol/L (ref 135–145)

## 2023-07-22 LAB — ECHOCARDIOGRAM COMPLETE
AR max vel: 2.15 cm2
AV Area VTI: 2.06 cm2
AV Area mean vel: 2.09 cm2
AV Mean grad: 2 mm[Hg]
AV Peak grad: 3.9 mm[Hg]
Ao pk vel: 0.98 m/s
Area-P 1/2: 3.37 cm2
Height: 74 in
S' Lateral: 2.7 cm
Weight: 2719.59 [oz_av]

## 2023-07-22 LAB — CBC
HCT: 36.9 % — ABNORMAL LOW (ref 39.0–52.0)
HCT: 36.4 % — ABNORMAL LOW (ref 39.0–52.0)
Hemoglobin: 11.9 g/dL — ABNORMAL LOW (ref 13.0–17.0)
Hemoglobin: 11.4 g/dL — ABNORMAL LOW (ref 13.0–17.0)
MCH: 27.7 pg (ref 26.0–34.0)
MCH: 27.1 pg (ref 26.0–34.0)
MCHC: 32.2 g/dL (ref 30.0–36.0)
MCHC: 31.3 g/dL (ref 30.0–36.0)
MCV: 85.8 fL (ref 80.0–100.0)
MCV: 86.7 fL (ref 80.0–100.0)
Platelets: 190 10*3/uL (ref 150–400)
Platelets: 181 10*3/uL (ref 150–400)
RBC: 4.3 MIL/uL (ref 4.22–5.81)
RBC: 4.2 MIL/uL — ABNORMAL LOW (ref 4.22–5.81)
RDW: 13.9 % (ref 11.5–15.5)
RDW: 13.9 % (ref 11.5–15.5)
WBC: 6.9 10*3/uL (ref 4.0–10.5)
WBC: 6.8 10*3/uL (ref 4.0–10.5)
nRBC: 0 % (ref 0.0–0.2)
nRBC: 0 % (ref 0.0–0.2)

## 2023-07-22 LAB — POCT ACTIVATED CLOTTING TIME
Activated Clotting Time: 273 s
Activated Clotting Time: 302 s
Activated Clotting Time: 279 s

## 2023-07-22 LAB — CREATININE, SERUM
Creatinine, Ser: 1.35 mg/dL — ABNORMAL HIGH (ref 0.61–1.24)
GFR, Estimated: 58 mL/min — ABNORMAL LOW (ref >60)

## 2023-07-22 SURGERY — LEFT HEART CATH AND CORONARY ANGIOGRAPHY
Anesthesia: LOCAL

## 2023-07-22 MED ORDER — NITROGLYCERIN 1 MG/10 ML FOR IR/CATH LAB
INTRA_ARTERIAL | Status: DC | PRN
  Administered 2023-07-22 (×2): 200 ug via INTRACORONARY

## 2023-07-22 MED ORDER — SODIUM CHLORIDE 0.9% FLUSH
3.0000 mL | Freq: Two times a day (BID) | INTRAVENOUS | Status: DC
  Administered 2023-07-22 – 2023-07-23 (×3): 3 mL via INTRAVENOUS

## 2023-07-22 MED ORDER — TICAGRELOR 90 MG PO TABS
90.0000 mg | ORAL_TABLET | Freq: Two times a day (BID) | ORAL | Status: DC
  Administered 2023-07-22 – 2023-07-23 (×2): 90 mg via ORAL
  Filled 2023-07-22 (×2): qty 1

## 2023-07-22 MED ORDER — HYDRALAZINE HCL 20 MG/ML IJ SOLN: 10.0000 mg | INTRAMUSCULAR | Status: AC | PRN

## 2023-07-22 MED ORDER — LIDOCAINE HCL (PF) 1 % IJ SOLN
INTRAMUSCULAR | Status: AC
  Filled 2023-07-22: qty 30

## 2023-07-22 MED ORDER — LABETALOL HCL 5 MG/ML IV SOLN: 10.0000 mg | INTRAVENOUS | Status: AC | PRN

## 2023-07-22 MED ORDER — FENTANYL CITRATE (PF) 100 MCG/2ML IJ SOLN
INTRAMUSCULAR | Status: AC
  Filled 2023-07-22: qty 2

## 2023-07-22 MED ORDER — NITROGLYCERIN 1 MG/10 ML FOR IR/CATH LAB
INTRA_ARTERIAL | Status: AC
  Filled 2023-07-22: qty 10

## 2023-07-22 MED ORDER — SODIUM CHLORIDE 0.9 % IV SOLN: INTRAVENOUS | Status: AC

## 2023-07-22 MED ORDER — IOHEXOL 350 MG/ML SOLN
INTRAVENOUS | Status: DC | PRN
  Administered 2023-07-22: 65 mL via INTRA_ARTERIAL

## 2023-07-22 MED ORDER — SODIUM CHLORIDE 0.9 % IV SOLN: 250.0000 mL | INTRAVENOUS | Status: DC | PRN

## 2023-07-22 MED ORDER — HEPARIN SODIUM (PORCINE) 1000 UNIT/ML IJ SOLN
INTRAMUSCULAR | Status: DC | PRN
  Administered 2023-07-22: 4000 [IU] via INTRAVENOUS
  Administered 2023-07-22: 2000 [IU] via INTRAVENOUS

## 2023-07-22 MED ORDER — MIDAZOLAM HCL 2 MG/2ML IJ SOLN
INTRAMUSCULAR | Status: DC | PRN
  Administered 2023-07-22: 1 mg via INTRAVENOUS

## 2023-07-22 MED ORDER — HEPARIN SODIUM (PORCINE) 1000 UNIT/ML IJ SOLN
INTRAMUSCULAR | Status: AC
  Filled 2023-07-22: qty 10

## 2023-07-22 MED ORDER — MIDAZOLAM HCL 2 MG/2ML IJ SOLN
INTRAMUSCULAR | Status: AC
  Filled 2023-07-22: qty 2

## 2023-07-22 MED ORDER — LIDOCAINE HCL (PF) 1 % IJ SOLN
INTRAMUSCULAR | Status: DC | PRN
  Administered 2023-07-22: 2 mL

## 2023-07-22 MED ORDER — SODIUM CHLORIDE 0.9% FLUSH: 3.0000 mL | INTRAVENOUS | Status: DC | PRN

## 2023-07-22 MED ORDER — HEPARIN SODIUM (PORCINE) 5000 UNIT/ML IJ SOLN
5000.0000 [IU] | Freq: Three times a day (TID) | INTRAMUSCULAR | Status: DC
  Administered 2023-07-22 – 2023-07-23 (×2): 5000 [IU] via SUBCUTANEOUS
  Filled 2023-07-22 (×2): qty 1

## 2023-07-22 MED ORDER — TICAGRELOR 90 MG PO TABS
ORAL_TABLET | ORAL | Status: DC | PRN
  Administered 2023-07-22: 180 mg via ORAL

## 2023-07-22 MED ORDER — FENTANYL CITRATE (PF) 100 MCG/2ML IJ SOLN
INTRAMUSCULAR | Status: DC | PRN
  Administered 2023-07-22: 25 ug via INTRAVENOUS
  Administered 2023-07-22: 50 ug via INTRAVENOUS

## 2023-07-22 MED ORDER — HEPARIN SODIUM (PORCINE) 1000 UNIT/ML IJ SOLN
INTRAMUSCULAR | Status: DC | PRN
  Administered 2023-07-22 (×2): 4000 [IU] via INTRAVENOUS

## 2023-07-22 MED ORDER — TICAGRELOR 90 MG PO TABS
ORAL_TABLET | ORAL | Status: AC
  Filled 2023-07-22: qty 2

## 2023-07-22 MED ORDER — VERAPAMIL HCL 2.5 MG/ML IV SOLN
INTRAVENOUS | Status: AC
  Filled 2023-07-22: qty 2

## 2023-07-22 MED ORDER — HEPARIN (PORCINE) IN NACL 1000-0.9 UT/500ML-% IV SOLN
INTRAVENOUS | Status: DC | PRN
  Administered 2023-07-22 (×2): 500 mL

## 2023-07-22 MED ORDER — VERAPAMIL HCL 2.5 MG/ML IV SOLN
INTRAVENOUS | Status: DC | PRN
  Administered 2023-07-22: 10 mL via INTRA_ARTERIAL

## 2023-07-22 SURGICAL SUPPLY — 19 items
BALLN SCOREFLEX 3.0X15 (BALLOONS) ×1
BALLN ~~LOC~~ EMERGE MR 3.75X15 (BALLOONS) ×1
BALLOON SCOREFLEX 3.0X15 (BALLOONS) IMPLANT
BALLOON ~~LOC~~ EMERGE MR 3.75X15 (BALLOONS) IMPLANT
CATH INFINITI AMBI 5FR JK (CATHETERS) IMPLANT
CATH LAUNCHER 6FR EBU3.5 (CATHETERS) IMPLANT
CATH OPTICROSS HD (CATHETERS) IMPLANT
DEVICE RAD COMP TR BAND LRG (VASCULAR PRODUCTS) IMPLANT
GLIDESHEATH SLEND SS 6F .021 (SHEATH) IMPLANT
GUIDEWIRE INQWIRE 1.5J.035X260 (WIRE) IMPLANT
INQWIRE 1.5J .035X260CM (WIRE) ×1
KIT ENCORE 26 ADVANTAGE (KITS) IMPLANT
KIT SYRINGE INJ CVI SPIKEX1 (MISCELLANEOUS) IMPLANT
PACK CARDIAC CATHETERIZATION (CUSTOM PROCEDURE TRAY) ×1 IMPLANT
SET ATX-X65L (MISCELLANEOUS) IMPLANT
SLED PULL BACK IVUS (MISCELLANEOUS) IMPLANT
STENT SYNERGY XD 3.50X28 (Permanent Stent) IMPLANT
SYNERGY XD 3.50X28 (Permanent Stent) ×1 IMPLANT
WIRE RUNTHROUGH .014X180CM (WIRE) IMPLANT

## 2023-07-22 NOTE — Progress Notes (Signed)
Cardiology Note:  Pt doing well post cath. Right radial band in place. Sinus on tele.  No changes.   Verne Carrow, MD, [facility] 07/22/2023 10:30 AM

## 2023-07-22 NOTE — Plan of Care (Signed)
  Problem: Education: Goal: Knowledge of General Education information will improve Description: Including pain rating scale, medication(s)/side effects and non-pharmacologic comfort measures Outcome: Progressing   Problem: Health Behavior/Discharge Planning: Goal: Ability to manage health-related needs will improve Outcome: Progressing   Problem: Clinical Measurements: Goal: Will remain free from infection Outcome: Progressing Goal: Respiratory complications will improve Outcome: Progressing Goal: Cardiovascular complication will be avoided Outcome: Progressing   Problem: Nutrition: Goal: Adequate nutrition will be maintained Outcome: Progressing   Problem: Pain Managment: Goal: General experience of comfort will improve and/or be controlled Outcome: Progressing   Problem: Safety: Goal: Ability to remain free from injury will improve Outcome: Progressing   Problem: Activity: Goal: Ability to return to baseline activity level will improve Outcome: Progressing   Problem: Cardiovascular: Goal: Vascular access site(s) Level 0-1 will be maintained Outcome: Progressing   Problem: Activity: Goal: Ability to tolerate increased activity will improve Outcome: Progressing   Problem: Health Behavior/Discharge Planning: Goal: Ability to safely manage health-related needs after discharge will improve Outcome: Progressing

## 2023-07-22 NOTE — Interval H&P Note (Signed)
History and Physical Interval Note:  07/22/2023 7:36 AM  [name] [name]  has presented today for surgery, with the diagnosis of NSTEMI.  The various methods of treatment have been discussed with the patient and family. After consideration of risks, benefits and other options for treatment, the patient has consented to  Procedure(s): LEFT HEART CATH AND CORONARY ANGIOGRAPHY (N/A) as a surgical intervention.  The patient's history has been reviewed, patient examined, no change in status, stable for surgery.  I have reviewed the patient's chart and labs.  Questions were answered to the patient's satisfaction.    Cath Lab Visit (complete for each Cath Lab visit)  Clinical Evaluation Leading to the Procedure:   ACS: Yes.    Non-ACS:  N/A  Shanette Tamargo

## 2023-07-22 NOTE — TOC Initial Note (Signed)
Transition of Care [facility]) - Initial/Assessment Note    Patient Details  Name: [name] [name] MRN: [id number] Date of Birth: 1956-03-22  Transition of Care [facility]) CM/SW Contact:    Marliss Coots, LCSW Phone Number: 07/22/2023, 3:53 PM  Clinical Narrative:                  3:53 PM CSW introduced herself and role to patient at bedside. CSW informed patient of TOC Consult (Substance Abuse Counseling/Resources). Patient declined CSW offer of substance use resources and informed CSW that he "does not abuse substances".  Expected Discharge Plan: Home/Self Care Barriers to Discharge: Continued Medical Work up   Patient Goals and CMS Choice            Expected Discharge Plan and Services In-house Referral: Clinical Social Work     Living arrangements for the past 2 months: Apartment                                      Prior Living Arrangements/Services Living arrangements for the past 2 months: Apartment Lives with:: Self Patient language and need for interpreter reviewed:: Yes        Need for Family Participation in Patient Care: Yes (Comment) Care giver support system in place?: No (comment)   Criminal Activity/Legal Involvement Pertinent to Current Situation/Hospitalization: No - Comment as needed  Activities of Daily Living   ADL Screening (condition at time of admission) Independently performs ADLs?: Yes (appropriate for developmental age) Is the patient deaf or have difficulty hearing?: No Does the patient have difficulty seeing, even when wearing glasses/contacts?: No Does the patient have difficulty concentrating, remembering, or making decisions?: No  Permission Sought/Granted Permission sought to share information with : Family Supports Permission granted to share information with : No (Contact information on chart)  Share Information with NAME: Chanel Terie Purser     Permission granted to share info w Relationship: Daughter  Permission granted to  share info w Contact Information: [phone]  Emotional Assessment Appearance:: Appears stated age Attitude/Demeanor/Rapport: Engaged Affect (typically observed): Accepting, Appropriate, Adaptable, Calm, Stable, Pleasant   Alcohol / Substance Use: Not Applicable Psych Involvement: No (comment)  Admission diagnosis:  Atypical chest pain [R07.89] NSTEMI (non-ST elevated myocardial infarction) [facility]) [I21.4] Chest pain [R07.9] Patient Active Problem List   Diagnosis Date Noted   Anemia 07/20/2023   Hyperlipidemia 07/20/2023   Primary hypertension 07/20/2023   Chest pain 07/19/2023   Prostate cancer (HCC) 01/29/2020   Abnormal CT scan, chest 11/13/2019   Chronic diastolic heart failure (HCC) 11/13/2017   NSTEMI (non-ST elevated myocardial infarction) (HCC) 11/11/2017   SOB (shortness of breath) on exertion 07/21/2013   Dizziness 07/21/2013   Essential hypertension 07/21/2013   Current smoker 07/21/2013   Alcohol abuse 07/21/2013   CAD S/P percutaneous coronary angioplasty 07/21/2013   H/O medication noncompliance 07/21/2013   Hyperlipidemia LDL goal <70 07/21/2013   PCP:  Karenann Cai, NP Pharmacy:   [facility] - [place] - [address] [place] ([place]) [place] [address] Phone: [phone] Fax: [phone]  [id number] [place], [place] - [address] AT [facility] [address] [place] [address] Phone: [phone] Fax: [phone]  [place] [facility] [address] [place] [address] Phone: [phone] Fax: [phone]     Social Drivers of Health (SDOH) Social History:  SDOH Screenings   Food Insecurity: No Food Insecurity (07/19/2023)  Housing: Unknown (07/19/2023)  Transportation Needs: No Transportation Needs (07/19/2023)  Utilities: Not At Risk (07/19/2023)  Alcohol Screen: Low Risk  (05/05/2020)  Depression (PHQ2-9): Medium Risk (05/05/2020)   Financial Resource Strain: Not on File (11/16/2021)   Received from [facility]  Recent Concern: Financial Resource Strain - At Risk (11/16/2021)   Received from [facility]  Physical Activity: Not on File (10/12/2021)   Received from [place], [place]  Social Connections: Unknown (07/19/2023)  Stress: Not on File (10/12/2021)   Received from [place], [place]  Tobacco Use: High Risk (07/19/2023)   SDOH Interventions:     Readmission Risk Interventions     No data to display

## 2023-07-22 NOTE — Progress Notes (Signed)
*  PRELIMINARY RESULTS* Echocardiogram 2D Echocardiogram has been performed.  [name] [name] 07/22/2023, 11:23 AM

## 2023-07-23 ENCOUNTER — Other Ambulatory Visit (HOSPITAL_COMMUNITY): Payer: Self-pay

## 2023-07-23 ENCOUNTER — Encounter: Payer: Self-pay | Admitting: Hematology and Oncology

## 2023-07-23 ENCOUNTER — Telehealth: Payer: Self-pay | Admitting: Cardiology

## 2023-07-23 ENCOUNTER — Telehealth (HOSPITAL_COMMUNITY): Payer: Self-pay | Admitting: Pharmacy Technician

## 2023-07-23 ENCOUNTER — Other Ambulatory Visit: Payer: Self-pay | Admitting: Cardiology

## 2023-07-23 DIAGNOSIS — Z86011 Personal history of benign neoplasm of the brain: Secondary | ICD-10-CM

## 2023-07-23 DIAGNOSIS — I214 Non-ST elevation (NSTEMI) myocardial infarction: Secondary | ICD-10-CM | POA: Diagnosis not present

## 2023-07-23 DIAGNOSIS — F1020 Alcohol dependence, uncomplicated: Secondary | ICD-10-CM | POA: Insufficient documentation

## 2023-07-23 LAB — CBC
HCT: 35.4 % — ABNORMAL LOW (ref 39.0–52.0)
Hemoglobin: 11.8 g/dL — ABNORMAL LOW (ref 13.0–17.0)
MCH: 28 pg (ref 26.0–34.0)
MCHC: 33.3 g/dL (ref 30.0–36.0)
MCV: 84.1 fL (ref 80.0–100.0)
Platelets: 189 10*3/uL (ref 150–400)
RBC: 4.21 MIL/uL — ABNORMAL LOW (ref 4.22–5.81)
RDW: 13.9 % (ref 11.5–15.5)
WBC: 6.5 10*3/uL (ref 4.0–10.5)
nRBC: 0 % (ref 0.0–0.2)

## 2023-07-23 LAB — LIPOPROTEIN A (LPA): Lipoprotein (a): 145.2 nmol/L — ABNORMAL HIGH (ref <75.0)

## 2023-07-23 LAB — BASIC METABOLIC PANEL
Anion gap: 9 (ref 5–15)
BUN: 24 mg/dL — ABNORMAL HIGH (ref 8–23)
CO2: 24 mmol/L (ref 22–32)
Calcium: 9.6 mg/dL (ref 8.9–10.3)
Chloride: 103 mmol/L (ref 98–111)
Creatinine, Ser: 1.51 mg/dL — ABNORMAL HIGH (ref 0.61–1.24)
GFR, Estimated: 51 mL/min — ABNORMAL LOW (ref >60)
Glucose, Bld: 116 mg/dL — ABNORMAL HIGH (ref 70–99)
Potassium: 4.3 mmol/L (ref 3.5–5.1)
Sodium: 136 mmol/L (ref 135–145)

## 2023-07-23 MED ORDER — TICAGRELOR 90 MG PO TABS
90.0000 mg | ORAL_TABLET | Freq: Two times a day (BID) | ORAL | 2 refills | Status: DC
  Filled 2023-07-23: qty 60, 30d supply, fill #0

## 2023-07-23 MED ORDER — ATORVASTATIN CALCIUM 80 MG PO TABS
80.0000 mg | ORAL_TABLET | Freq: Every day | ORAL | 1 refills | Status: AC
  Filled 2023-07-23: qty 90, 90d supply, fill #0

## 2023-07-23 MED ORDER — CARVEDILOL 6.25 MG PO TABS
6.2500 mg | ORAL_TABLET | Freq: Two times a day (BID) | ORAL | Status: DC
  Administered 2023-07-23: 6.25 mg via ORAL
  Filled 2023-07-23: qty 1

## 2023-07-23 MED ORDER — ASPIRIN 81 MG PO TBEC
81.0000 mg | DELAYED_RELEASE_TABLET | Freq: Every day | ORAL | 1 refills | Status: DC
  Filled 2023-07-23: qty 90, 90d supply, fill #0

## 2023-07-23 MED ORDER — AMLODIPINE BESYLATE 10 MG PO TABS
10.0000 mg | ORAL_TABLET | Freq: Every day | ORAL | 0 refills | Status: DC
  Filled 2023-07-23: qty 90, 90d supply, fill #0

## 2023-07-23 MED ORDER — CARVEDILOL 6.25 MG PO TABS
6.2500 mg | ORAL_TABLET | Freq: Two times a day (BID) | ORAL | 1 refills | Status: DC
  Filled 2023-07-23: qty 180, 90d supply, fill #0

## 2023-07-23 MED ORDER — NITROGLYCERIN 0.4 MG SL SUBL
0.4000 mg | SUBLINGUAL_TABLET | SUBLINGUAL | 2 refills | Status: AC | PRN
  Filled 2023-07-23: qty 25, 5d supply, fill #0

## 2023-07-23 NOTE — Telephone Encounter (Signed)
Per chart review patient remains admitted  Nursing will continue to monitor admission and outreach post discharge

## 2023-07-23 NOTE — Telephone Encounter (Signed)
Patient Product/process development scientist completed.    The patient is insured through [facility]. Patient has Medicare and is not eligible for a copay card, but may be able to apply for patient assistance or Medicare RX Payment Plan (Patient Must reach out to their plan, if eligible for payment plan), if available.    Ran test claim for Brilinta 90 mg and the current 30 day co-pay is $467.00 due to a $420.00 deductible.  Will be $47.00 once deductible is met.   This test claim was processed through [facility]- copay amounts may vary at other pharmacies due to pharmacy/plan contracts, or as the patient moves through the different stages of their insurance plan.     [name] [name], CPHT Pharmacy Technician III Certified Patient Advocate [facility] Patient Advocate Team Direct Number: [phone]  Fax: [phone]

## 2023-07-23 NOTE — Care Management Important Message (Addendum)
Important Message  Patient Details  Name: [name] [name] MRN: [id number] Date of Birth: 1956/05/07   Important Message Given:  Yes - Medicare IM  Patient left prior to IM delivery will aend a copy to the patient home address   Dorena Bodo 07/23/2023, 2:18 PM

## 2023-07-23 NOTE — Progress Notes (Signed)
error

## 2023-07-23 NOTE — TOC Transition Note (Signed)
Transition of Care [facility]) - Discharge Note   Patient Details  Name: [name] [name] MRN: [id number] Date of Birth: 1956-03-18  Transition of Care [facility]) CM/SW Contact:  Marliss Coots, LCSW Phone Number: 07/23/2023, 11:44 AM   Clinical Narrative:     Patient will DC to: Home Anticipated DC date: 07/23/2023 Family notified: Raelyn Mora; Daughter; [phone] Transport by: Wilmon Arms   Per MD patient ready for DC home. RN, patient, and patient's family (per patient) notified of DC. DC packet on chart. Taxi voucher requested for patient. Patient was provided taxi voucher and signed Therapist, nutritional and Liability form to be put in chart by Licensed conveyancer.  CSW will sign off for now as social work intervention is no longer needed. Please consult [place] again if new needs arise.   Final next level of care: Home/Self Care Barriers to Discharge: Barriers Resolved   Patient Goals and CMS Choice            Discharge Placement                Patient to be transferred to facility by: [facility] Name of family member notified: Raelyn Mora; [phone] Patient and family notified of of transfer: 07/23/23  Discharge Plan and Services Additional resources added to the After Visit Summary for   In-house Referral: Clinical Social Work                                   Social Drivers of Health (SDOH) Interventions SDOH Screenings   Food Insecurity: No Food Insecurity (07/19/2023)  Housing: Unknown (07/19/2023)  Transportation Needs: No Transportation Needs (07/19/2023)  Utilities: Not At Risk (07/19/2023)  Alcohol Screen: Low Risk  (05/05/2020)  Depression (PHQ2-9): Medium Risk (05/05/2020)  Financial Resource Strain: Not on File (11/16/2021)   Received from [facility]  Recent Concern: Financial Resource Strain - At Risk (11/16/2021)   [facility] from [facility]  Physical Activity: Not on File (10/12/2021)   Received from [place], [place]  Social Connections:  Unknown (07/19/2023)  Stress: Not on File (10/12/2021)   Received from [place], [place]  Tobacco Use: High Risk (07/19/2023)     Readmission Risk Interventions     No data to display

## 2023-07-23 NOTE — Telephone Encounter (Signed)
   Transition of Care Follow-up Phone Call Request    Patient Name: [name] [name] Date of Birth: [birth year] Date of Encounter: 07/23/2023  Primary Care Provider:  Karenann Cai, NP Primary Cardiologist:  Nanetta Batty, MD  Loetta Rough has been scheduled for a transition of care follow up appointment with a [facility] provider:  Bernadene Person 2/4  Please reach out to [facility] within 48 hours of discharge to confirm appointment and review transition of care protocol questionnaire. Anticipated discharge date: 1/28  Laverda Page, NP  07/23/2023, 10:55 AM

## 2023-07-23 NOTE — Discharge Summary (Addendum)
Discharge Summary    Patient ID: [name] [name] MRN: [id number]; DOB: 06-21-1956  Admit date: 07/19/2023 Discharge date: 07/23/2023  PCP:  Karenann Cai, NP   [facility] Providers Cardiologist:  Nanetta Batty, MD     Discharge Diagnoses    Principal Problem:   Chest pain Active Problems:   NSTEMI (non-ST elevated myocardial infarction) (HCC)   Anemia   Hyperlipidemia   Primary hypertension   H/O meningioma of the brain   EtOH dependence [facility])  Diagnostic Studies/Procedures    Cath: 07/22/2023  Conclusions: Severe two-vessel coronary artery disease, as detailed below, including 90% mid LAD in-stent restenosis and 99% distal LCx/LPAV stenosis.  There is also moderate disease involving the mid LAD distal to the previously placed stent and in the proximal/distal RCA. Patent LAD stent with 90% in-stent restenosis, as detailed above. Patent overlapping LCx/OM1 stents with 30-40% focal in-stent restenosis in OM1. Normal left ventricular filling pressure (LVEDP 10 mmHg). Successful IVUS-guided PCI to in-stent restenosis and de novo disease involving mid LAD using Synergy XD 3.5 x 28 mm drug-eluting stent with 0% residual stenosis and TIMI-3 flow.   Recommendations: Dual antiplatelet therapy with aspirin and ticagrelor for at least 12 months. Aggressive secondary prevention of coronary artery disease. Continue medical therapy for severe disease involving ramus intermedius and distal LCx, as these vessels are too small for intervention.   Yvonne Kendall, MD [facility]  Diagnostic Dominance: Right  Intervention   Echo: 07/22/2023  IMPRESSIONS     1. Left ventricular ejection fraction, by estimation, is 60 to 65%. The  left ventricle has normal function. The left ventricle has no regional  wall motion abnormalities. Left ventricular diastolic parameters were  normal.   2. Right ventricular systolic function is normal. The right ventricular  size  is normal.   3. The mitral valve is normal in structure. Trivial mitral valve  regurgitation. No evidence of mitral stenosis.   4. The aortic valve is normal in structure. Aortic valve regurgitation is  not visualized. No aortic stenosis is present.   5. The inferior vena cava is normal in size with greater than 50%  respiratory variability, suggesting right atrial pressure of 3 mmHg.   FINDINGS   Left Ventricle: Left ventricular ejection fraction, by estimation, is 60  to 65%. The left ventricle has normal function. The left ventricle has no  regional wall motion abnormalities. The left ventricular internal cavity  size was normal in size. There is   no left ventricular hypertrophy. Left ventricular diastolic parameters  were normal.   Right Ventricle: The right ventricular size is normal. No increase in  right ventricular wall thickness. Right ventricular systolic function is  normal.   Left Atrium: Left atrial size was normal in size.   Right Atrium: Right atrial size was normal in size.   Pericardium: Trivial pericardial effusion is present.   Mitral Valve: The mitral valve is normal in structure. Trivial mitral  valve regurgitation. No evidence of mitral valve stenosis.   Tricuspid Valve: The tricuspid valve is normal in structure. Tricuspid  valve regurgitation is not demonstrated. No evidence of tricuspid  stenosis.   Aortic Valve: The aortic valve is normal in structure. Aortic valve  regurgitation is not visualized. No aortic stenosis is present. Aortic  valve mean gradient measures 2.0 mmHg. Aortic valve peak gradient measures  3.9 mmHg. Aortic valve area, by VTI  measures 2.06 cm.   Pulmonic Valve: The pulmonic valve was normal in structure. Pulmonic valve  regurgitation is not visualized. No evidence of pulmonic stenosis.   Aorta: The aortic root is normal in size and structure.   Venous: The inferior vena cava is normal in size with greater than 50%   respiratory variability, suggesting right atrial pressure of 3 mmHg.   IAS/Shunts: No atrial level shunt detected by color flow Doppler.   _____________   History of Present Illness     [name] [name] is a 68 y.o. male with a hx of CAD s/p BMS to LAD 03/2010 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   Last cath was in 2019 at time of MI/PCI with extensive Cx-OM disease treated with 2 overlapping DES. He had residual small caliber ramus & AV Groove cx vessels with 80% & 95% lesions to be treated medically and mild ISR of pLAD stent with normal EF. Last echo 2021 EF 60-65%, G1DD, mild MR, mild aortic sclerosis without stenosis. Stress test was ordered in 2021 but the patient did not complete this. He also has seen neurosurgery for meningioma diagnosed in 2018. Per neurosurgery [facility] note 04/2023, patient has a known history of large right-sided sphenoid wing meningioma for which the recommendation from the multidisciplinary tumor conference was to proceed with surgical resection. He was lost to follow-up for 2 years but recently saw them back for headache and sinus pain for a month; unable to see subsequent A/P. The patient stated they planned to schedule him for a follow-up appointment in the future but he doesn't know any further details. 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 There is a history of noncompliance with medication  therapy.    He returned to the hospital with SOB for the past 4 days more noticeable with exertion, as well as on/off chest pain for 2-3 days. He describes the chest pain as a tightness/pulling sensation, happening at random, lasting variable amounts of time. It has seemed to be worse at night when lying down to sleep. The night prior to admission his chest pain was so bad for hours so came to ED today. hsTroponin 45->47, Cr 1.43 and Hgb 11.9 c/w prior. CXR NAD. SBP 169/135 on arrival, last checked 160/79 without intervention. UDS neg. EKG showed NSR 66bpm with diffuse STTW changes, QTc with new TWI V3-V6. He has had prior nonspecific STTW changes V5-V6 as well as inferiorly, but the overt TWI V3-V4 is new.  He reported he had not taken his medicines for quite some time, only chewable ASA the last few days and occasional Advil. He reported chronic fatigue and being chronically sedentary which he attributed to his prostate CA. He also reported being under a lot of stress. He went for a walk on Monday and felt at baseline but had some vomiting for 10 minutes when he got home that has not recurred. Denied fever, chills, cough. He still currently smokes and reported drinking 1 shot of liquor nightly. This was previously reported as three fifths per week in the past. He  was admitted for further management with plans for cardiac cath.   Hospital Course     NSTEMI CAD -- High-sensitivity troponin 45>> 47, underwent cardiac catheterization which showed severe stenosis of mid LAD stented segment which was treated with PCI/DES x 1.  Does have residual disease in distal circumflex and RCA to be treated medically.  Placed on DAPT with aspirin/Brilinta for at least 1 year. -- Continue aspirin, Brilinta,  HTN -- Home lisinopril was not resumed in setting of CKD -- Continue carvedilol 6.25 mg twice daily, amlodipine 5 mg daily  HLD -- LDL 196, HDL 32 -- Continue atorvastatin 80 mg daily  CKD IIIa --  Baseline creatinine around 1.4-1.5 -- BMP at follow-up appointment  Carotid artery disease by duplex 08/2022, mild dilation of infrarenal arteries/bilateral common iliac arteries by CT 01/2023 -- no interim neuro symptoms -- outpatient follow-up   ETOH/tobacco use -- patient has prior documentation of drinking average of 3 fifth's per week, currently reports only drinking 1 shot nightly, drinks 2 cigarettes per day -- monitored via CIWA protocol during admission -- cessation advised    Brain meningioma  -- with unclear plan of care (saw neurosurgery 04/2023 unable to review full note) -- 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 Clifton James and deemed stable for discharge home.  Follow-up arranged in the office.  Medication sent to [facility] pharmacy.  Educated by [employer].D. prior to discharge.  Did the patient have an acute coronary syndrome (MI, NSTEMI, STEMI, etc) this admission?:  Yes                               AHA/ACC ACS Clinical Performance & Quality Measures: Aspirin prescribed? - Yes ADP Receptor Inhibitor (Plavix/Clopidogrel, Brilinta/Ticagrelor or Effient/Prasugrel) prescribed (includes medically managed patients)? - Yes Beta Blocker prescribed? - Yes High Intensity Statin (Lipitor 40-80mg  or Crestor 20-40mg ) prescribed? - Yes EF assessed during THIS hospitalization? - Yes For EF <40%, was ACEI/ARB prescribed? - Not Applicable (EF >/= 40%) For EF <40%, Aldosterone Antagonist (Spironolactone or Eplerenone) prescribed? - Not Applicable (EF >/= 40%) Cardiac Rehab Phase II ordered (including medically managed patients)? - Yes   The patient will be scheduled for a TOC follow up appointment in 10-14 days.  A message has been sent to the [facility] and Scheduling Pool at the office where the patient should be seen for follow up.  _____________  Discharge Vitals Blood pressure (!) 151/91, pulse 75, temperature 98.6 F (37 C), temperature  source Oral, resp. rate 14, height 6\' 2"  (1.88 m), weight 77.1 kg, SpO2 93%.  Filed Weights   07/19/23 0927 07/19/23 2132 07/21/23 1948  Weight: 85.7 kg 80.3 kg 77.1 kg    Labs & Radiologic Studies    CBC Recent Labs    07/22/23 0931 07/23/23 0737  WBC 6.8 6.5  HGB 11.4* 11.8*  HCT 36.4* 35.4*  MCV 86.7 84.1  PLT 181 189   Basic Metabolic Panel Recent Labs    56/21/30 0214 07/22/23 0931 07/23/23 0737  NA 135  --  136  K 4.2  --  4.3  CL 102  --  103  CO2 24  --  24  GLUCOSE 98  --  116*  BUN 25*  --  24*  CREATININE 1.54* 1.35* 1.51*  CALCIUM 9.4  --  9.6   Liver Function Tests No results for input(s): "AST", "ALT", "ALKPHOS", "  BILITOT", "PROT", "ALBUMIN" in the last 72 hours. No results for input(s): "LIPASE", "AMYLASE" in the last 72 hours. High Sensitivity Troponin:   Recent Labs  Lab 07/19/23 0936 07/19/23 1149  TROPONINIHS 45* 47*    BNP Invalid input(s): "POCBNP" D-Dimer No results for input(s): "DDIMER" in the last 72 hours. Hemoglobin A1C No results for input(s): "HGBA1C" in the last 72 hours. Fasting Lipid Panel No results for input(s): "CHOL", "HDL", "LDLCALC", "TRIG", "CHOLHDL", "LDLDIRECT" in the last 72 hours. Thyroid Function Tests No results for input(s): "TSH", "T4TOTAL", "T3FREE", "THYROIDAB" in the last 72 hours.  Invalid input(s): "FREET3" _____________  ECHOCARDIOGRAM COMPLETE Result Date: 07/22/2023    ECHOCARDIOGRAM REPORT   Patient Name:   [name] [name] Date of Exam: 07/22/2023 Medical Rec #:  [id number]        Height:       74.0 in Accession #:    [id number]       Weight:       170.0 lb Date of Birth:  Jun 30, 1955         BSA:          2.028 m Patient Age:    67 years         BP:           168/83 mmHg Patient Gender: M                HR:           59 bpm. Exam Location:  Inpatient Procedure: 2D Echo, Cardiac Doppler and Color Doppler Indications:    Chest pain R07.9  History:        Patient has prior history of Echocardiogram  examinations. CAD                 and Previous Myocardial Infarction, ETOH abuse; Risk                 Factors:Current Smoker, Dyslipidemia and Hypertension.  Sonographer:    Dondra Prader RVT RCS Referring Phys: [phone] LINDSAY B ROBERTS IMPRESSIONS  1. Left ventricular ejection fraction, by estimation, is 60 to 65%. The left ventricle has normal function. The left ventricle has no regional wall motion abnormalities. Left ventricular diastolic parameters were normal.  2. Right ventricular systolic function is normal. The right ventricular size is normal.  3. The mitral valve is normal in structure. Trivial mitral valve regurgitation. No evidence of mitral stenosis.  4. The aortic valve is normal in structure. Aortic valve regurgitation is not visualized. No aortic stenosis is present.  5. The inferior vena cava is normal in size with greater than 50% respiratory variability, suggesting right atrial pressure of 3 mmHg. FINDINGS  Left Ventricle: Left ventricular ejection fraction, by estimation, is 60 to 65%. The left ventricle has normal function. The left ventricle has no regional wall motion abnormalities. The left ventricular internal cavity size was normal in size. There is  no left ventricular hypertrophy. Left ventricular diastolic parameters were normal. Right Ventricle: The right ventricular size is normal. No increase in right ventricular wall thickness. Right ventricular systolic function is normal. Left Atrium: Left atrial size was normal in size. Right Atrium: Right atrial size was normal in size. Pericardium: Trivial pericardial effusion is present. Mitral Valve: The mitral valve is normal in structure. Trivial mitral valve regurgitation. No evidence of mitral valve stenosis. Tricuspid Valve: The tricuspid valve is normal in structure. Tricuspid valve regurgitation is not demonstrated. No evidence of tricuspid stenosis. Aortic Valve: The aortic valve  is normal in structure. Aortic valve regurgitation is not  visualized. No aortic stenosis is present. Aortic valve mean gradient measures 2.0 mmHg. Aortic valve peak gradient measures 3.9 mmHg. Aortic valve area, by VTI measures 2.06 cm. Pulmonic Valve: The pulmonic valve was normal in structure. Pulmonic valve regurgitation is not visualized. No evidence of pulmonic stenosis. Aorta: The aortic root is normal in size and structure. Venous: The inferior vena cava is normal in size with greater than 50% respiratory variability, suggesting right atrial pressure of 3 mmHg. IAS/Shunts: No atrial level shunt detected by color flow Doppler.  LEFT VENTRICLE PLAX 2D LVIDd:         4.10 cm   Diastology LVIDs:         2.70 cm   LV e' medial:    6.32 cm/s LV PW:         1.00 cm   LV E/e' medial:  9.3 LV IVS:        1.00 cm   LV e' lateral:   9.34 cm/s LVOT diam:     2.00 cm   LV E/e' lateral: 6.3 LV SV:         45 LV SV Index:   22 LVOT Area:     3.14 cm  RIGHT VENTRICLE             IVC RV Basal diam:  2.90 cm     IVC diam: 1.40 cm RV S prime:     12.70 cm/s TAPSE (M-mode): 2.4 cm LEFT ATRIUM             Index        RIGHT ATRIUM           Index LA diam:        2.90 cm 1.43 cm/m   RA Area:     13.60 cm LA Vol (A2C):   48.2 ml 23.76 ml/m  RA Volume:   28.30 ml  13.95 ml/m LA Vol (A4C):   45.5 ml 22.43 ml/m LA Biplane Vol: 50.9 ml 25.10 ml/m  AORTIC VALVE                    PULMONIC VALVE AV Area (Vmax):    2.15 cm     PV Vmax:       1.07 m/s AV Area (Vmean):   2.09 cm     PV Peak grad:  4.6 mmHg AV Area (VTI):     2.06 cm AV Vmax:           98.20 cm/s AV Vmean:          64.200 cm/s AV VTI:            0.218 m AV Peak Grad:      3.9 mmHg AV Mean Grad:      2.0 mmHg LVOT Vmax:         67.30 cm/s LVOT Vmean:        42.700 cm/s LVOT VTI:          0.143 m LVOT/AV VTI ratio: 0.66  AORTA Ao Root diam: 3.30 cm MITRAL VALVE MV Area (PHT): 3.37 cm    SHUNTS MV Decel Time: 225 msec    Systemic VTI:  0.14 m MV E velocity: 58.70 cm/s  Systemic Diam: 2.00 cm MV A velocity: 75.10 cm/s MV  E/A ratio:  0.78 Aditya Sabharwal Electronically signed by Dorthula Nettles Signature Date/Time: 07/22/2023/11:30:02 AM    Final    CARDIAC CATHETERIZATION  Result Date: 07/22/2023 Conclusions: Severe two-vessel coronary artery disease, as detailed below, including 90% mid LAD in-stent restenosis and 99% distal LCx/LPAV stenosis.  There is also moderate disease involving the mid LAD distal to the previously placed stent and in the proximal/distal RCA. Patent LAD stent with 90% in-stent restenosis, as detailed above. Patent overlapping LCx/OM1 stents with 30-40% focal in-stent restenosis in OM1. Normal left ventricular filling pressure (LVEDP 10 mmHg). Successful IVUS-guided PCI to in-stent restenosis and de novo disease involving mid LAD using Synergy XD 3.5 x 28 mm drug-eluting stent with 0% residual stenosis and TIMI-3 flow. Recommendations: Dual antiplatelet therapy with aspirin and ticagrelor for at least 12 months. Aggressive secondary prevention of coronary artery disease. Continue medical therapy for severe disease involving ramus intermedius and distal LCx, as these vessels are too small for intervention. Yvonne Kendall, MD [facility]  DG Chest 2 View Result Date: 07/19/2023 CLINICAL DATA:  Shortness of breath. EXAM: CHEST - 2 VIEW COMPARISON:  Chest radiograph dated December 11, 2019. CTA chest abdomen and pelvis dated February 18, 2023. FINDINGS: The heart size and mediastinal contours are within normal limits. Aortic atherosclerosis. No focal consolidation, pleural effusion, or pneumothorax. No acute osseous abnormality. IMPRESSION: No acute cardiopulmonary findings. Electronically Signed   By: Hart Robinsons M.D.   On: 07/19/2023 10:06   Disposition   Pt is being discharged home today in good condition.  Follow-up Plans & Appointments     Follow-up Information     Lisbeth Renshaw, MD Follow up.   Specialty: Neurosurgery Why: Please arrange for follow up regarding ongoing management  of your brain meningioma Office will call Contact information: [address] [place] [address] [phone]                Discharge Instructions     Amb Referral to Cardiac Rehabilitation   Complete by: As directed    Diagnosis:  Coronary Stents NSTEMI     After initial evaluation and assessments completed: Virtual Based Care may be provided alone or in conjunction with Phase 2 Cardiac Rehab based on patient barriers.: Yes   Intensive Cardiac Rehabilitation (ICR) [facility] location only OR Traditional Cardiac Rehabilitation (TCR) *If criteria for ICR are not met will enroll in TCR [facility] only): Yes   Call MD for:  difficulty breathing, headache or visual disturbances   Complete by: As directed    Call MD for:  persistant dizziness or light-headedness   Complete by: As directed    Call MD for:  redness, tenderness, or signs of infection (pain, swelling, redness, odor or green/yellow discharge around incision site)   Complete by: As directed    Diet - low sodium heart healthy   Complete by: As directed    Discharge instructions   Complete by: As directed    Radial Site Care Refer to this sheet in the next few weeks. These instructions provide you with information on caring for yourself after your procedure. Your caregiver may also give you more specific instructions. Your treatment has been planned according to current medical practices, but problems sometimes occur. Call your caregiver if you have any problems or questions after your procedure. HOME CARE INSTRUCTIONS You may shower the day after the procedure. Remove the bandage (dressing) and gently wash the site with plain soap and water. Gently pat the site dry.  Do not apply powder or lotion to the site.  Do not submerge the affected site in water for 3 to 5 days.  Inspect the site at least twice daily.  Do not flex or bend the affected arm for 24 hours.  No lifting over 5 pounds (2.3 kg) for 5 days after  your procedure.  Do not drive home if you are discharged the same day of the procedure. Have someone else drive you.  You may drive 24 hours after the procedure unless otherwise instructed by your caregiver.  What to expect: Any bruising will usually fade within 1 to 2 weeks.  Blood that collects in the tissue (hematoma) may be painful to the touch. It should usually decrease in size and tenderness within 1 to 2 weeks.  SEEK IMMEDIATE MEDICAL CARE IF: You have unusual pain at the radial site.  You have redness, warmth, swelling, or pain at the radial site.  You have drainage (other than a small amount of blood on the dressing).  You have chills.  You have a fever or persistent symptoms for more than 72 hours.  You have a fever and your symptoms suddenly get worse.  Your arm becomes pale, cool, tingly, or numb.  You have heavy bleeding from the site. Hold pressure on the site.   PLEASE DO NOT MISS ANY DOSES OF YOUR BRILINTA!!!!! Also keep a log of you blood pressures and bring back to your follow up appt. Please call the office with any questions.   Patients taking blood thinners should generally stay away from medicines like ibuprofen, Advil, Motrin, naproxen, and Aleve due to risk of stomach bleeding. You may take Tylenol as directed or talk to your primary doctor about alternatives.   PLEASE ENSURE THAT YOU DO NOT RUN OUT OF YOUR BRILINTA. This medication is very important to remain on for at least one year. IF you have issues obtaining this medication due to cost please CALL the office 3-5 business days prior to running out in order to prevent missing doses of this medication.   Increase activity slowly   Complete by: As directed         Discharge Medications   Allergies as of 07/23/2023   No Known Allergies      Medication List     STOP taking these medications    clopidogrel 75 MG tablet Commonly known as: PLAVIX   enzalutamide 40 MG capsule Commonly known as:  XTANDI   lisinopril 10 MG tablet Commonly known as: ZESTRIL       TAKE these medications    amLODipine 10 MG tablet Commonly known as: NORVASC Take 1 tablet (10 mg total) by mouth daily. Start taking on: July 24, 2023   aspirin EC 81 MG tablet Take 1 tablet (81 mg total) by mouth daily.   atorvastatin 80 MG tablet Commonly known as: LIPITOR Take 1 tablet (80 mg total) by mouth daily.   calcium-vitamin D 500-200 MG-UNIT tablet Commonly known as: OSCAL WITH D Take 1 tablet by mouth daily with breakfast.   carvedilol 6.25 MG tablet Commonly known as: COREG Take 1 tablet (6.25 mg total) by mouth 2 (two) times daily with a meal.   nitroGLYCERIN 0.4 MG SL tablet Commonly known as: NITROSTAT Place 1 tablet (0.4 mg total) under the tongue every 5 (five) minutes as needed for chest pain.   ticagrelor 90 MG Tabs tablet Commonly known as: BRILINTA Take 1 tablet (90 mg total) by mouth 2 (two) times daily.         Outstanding Labs/Studies   FLP/LFTs in 8 weeks BMET at follow up  Duration of Discharge  Encounter: APP Time: 25 minutes   Signed, Laverda Page, NP 07/23/2023, 11:31 AM  I have personally seen and examined this patient. I agree with the assessment and plan as outlined above.  30 minutes spent on this patient today. I have reviewed all of his progress notes during the hospitalization, EKG and cath films.  Labs personally reviewed.  He is a 69 yo male with history of CAD with prior coronary stenting, HTN, HLD, tobacco abuse, prior CVA, metastatic prostate cancer, CKD and PAD admitted with chest pain. Cardiac cath with severe restenosis mid LAD stented segment treated with a drug eluting stent. We are planning medical management of disease in the Circumflex and RCA. Echo with normal LV function.  My exam: NAD, CV:RRR Lungs: clear bilaterally Ext: no edema. Right arm cath site without hematoma.  Plan: CAD/NSTEMI: Cath findings as above, Images reviewed by me  DAPT with ASA and Brilinta for one year. Continue statin and resume beta blocker HTN: BP elevated today. Resume Coreg HLD: Continue statin  D/c home today.   Verne Carrow, MD, [facility] 07/23/2023 12:17 PM

## 2023-07-23 NOTE — Progress Notes (Signed)
CARDIAC REHAB PHASE I   PRE:  Rate/Rhythm: 93 NSR  BP:  Sitting: 162/69      SpO2: 94 RA  MODE:  Ambulation: 100 ft    POST:  Rate/Rhythm: 131 ST  BP:  Sitting: 194/93   Recheck: 124/102      SpO2: 94 RA  Pt ambulated with standby assistance in the hallway using cane. Pt walked well, denies chest pain and other unusual symptoms. Pt was educated on stent card, stent location, Antiplatelet and ASA use, wt restrictions, no baths/daily wash-ups, s/s of infection, ex guidelines, s/s to stop exercising, NTG use and calling 911, heart healthy diet, risk factors and CRPII. Pt received MI book and materials on exercise, diet, and CRPII. Will refer to [facility].   Pt has cataract surgery next month.   Faustino Congress  MS, ACSM-CEP 9:14 AM 07/23/2023    Service time is from 0845 to 0914.

## 2023-07-23 NOTE — Progress Notes (Signed)
Rounding Note    Patient Name: [name] [name] Date of Encounter: 07/23/2023  [facility] Cardiologist: Nanetta Batty, MD   Subjective   No chest pain this am. No events overnight.   Inpatient Medications    Scheduled Meds:  amLODipine  10 mg Oral Daily   aspirin EC  81 mg Oral Daily   atorvastatin  80 mg Oral Daily   folic acid  1 mg Oral Daily   heparin  5,000 Units Subcutaneous Q8H   LORazepam  0-4 mg Oral Q12H   multivitamin with minerals  1 tablet Oral Daily   pantoprazole  40 mg Oral Q0600   sodium chloride flush  3 mL Intravenous Q12H   sodium chloride flush  3 mL Intravenous Q12H   thiamine  100 mg Oral Daily   Or   thiamine  100 mg Intravenous Daily   ticagrelor  90 mg Oral BID   Continuous Infusions:  sodium chloride     PRN Meds: sodium chloride, acetaminophen, nitroGLYCERIN, ondansetron (ZOFRAN) IV, sodium chloride flush, sodium chloride flush   Vital Signs    Vitals:   07/22/23 1600 07/22/23 1908 07/22/23 2305 07/23/23 0506  BP: (!) 151/83 131/88 133/83 (!) 151/91  Pulse: 82 80 69 75  Resp:  17 15 14   Temp:  98.6 F (37 C) 98.6 F (37 C) 98.6 F (37 C)  TempSrc:  Oral Oral Oral  SpO2:  96% 98% 93%  Weight:      Height:        Intake/Output Summary (Last 24 hours) at 07/23/2023 0806 Last data filed at 07/22/2023 2000 Gross per 24 hour  Intake 1348.75 ml  Output 400 ml  Net 948.75 ml      07/21/2023    7:48 PM 07/19/2023    9:32 PM 07/19/2023    9:27 AM  Last 3 Weights  Weight (lbs) 169 lb 15.6 oz 177 lb 0.5 oz 189 lb  Weight (kg) 77.1 kg 80.3 kg 85.73 kg      Telemetry    Sinus Personally Reviewed  ECG    Sinus, inferior and anterolateral T wave inversion- Personally Reviewed  Physical Exam   General: Well developed, well nourished, NAD  HEENT: OP clear, mucus membranes moist  SKIN: warm, dry. No rashes. Neuro: No focal deficits  Musculoskeletal: Muscle strength 5/5 all ext  Psychiatric: Mood and affect normal   Neck: No JVD Lungs:Clear bilaterally, no wheezes, rhonci, crackles Cardiovascular: Regular rate and rhythm. No murmurs, gallops or rubs. Abdomen:Soft.  Extremities: No lower extremity edema.    Labs    High Sensitivity Troponin:   Recent Labs  Lab 07/19/23 0936 07/19/23 1149  TROPONINIHS 45* 47*     Chemistry Recent Labs  Lab 07/20/23 0213 07/21/23 0211 07/21/23 1946 07/22/23 0214 07/22/23 0931  NA 135 139  --  135  --   K 4.4 4.3  --  4.2  --   CL 103 106  --  102  --   CO2 24 25  --  24  --   GLUCOSE 98 110*  --  98  --   BUN 22 22  --  25*  --   CREATININE 1.51* 1.51* 1.58* 1.54* 1.35*  CALCIUM 9.2 9.5  --  9.4  --   PROT 7.0  --   --   --   --   ALBUMIN 3.6  --   --   --   --   AST 19  --   --   --   --  ALT 16  --   --   --   --   ALKPHOS 57  --   --   --   --   BILITOT 0.6  --   --   --   --   GFRNONAA 51* 51* 48* 50* 58*  ANIONGAP 8 8  --  9  --     Lipids  Recent Labs  Lab 07/20/23 0213  CHOL 267*  TRIG 194*  HDL 32*  LDLCALC 196*  CHOLHDL 8.3    Hematology Recent Labs  Lab 07/21/23 1946 07/22/23 0214 07/22/23 0931  WBC 6.7 6.9 6.8  RBC 4.34 4.30 4.20*  HGB 11.9* 11.9* 11.4*  HCT 37.3* 36.9* 36.4*  MCV 85.9 85.8 86.7  MCH 27.4 27.7 27.1  MCHC 31.9 32.2 31.3  RDW 13.9 13.9 13.9  PLT 189 190 181   Thyroid  Recent Labs  Lab 07/20/23 0213  TSH 1.190    BNP Recent Labs  Lab 07/21/23 1946  BNP 18.9    DDimer No results for input(s): "DDIMER" in the last 168 hours.   Radiology    ECHOCARDIOGRAM COMPLETE Result Date: 07/22/2023    ECHOCARDIOGRAM REPORT   Patient Name:   [name] [name] Date of Exam: 07/22/2023 Medical Rec #:  [id number]        Height:       74.0 in Accession #:    [id number]       Weight:       170.0 lb Date of Birth:  1956/03/26         BSA:          2.028 m Patient Age:    67 years         BP:           168/83 mmHg Patient Gender: M                HR:           59 bpm. Exam Location:  Inpatient Procedure: 2D Echo,  Cardiac Doppler and Color Doppler Indications:    Chest pain R07.9  History:        Patient has prior history of Echocardiogram examinations. CAD                 and Previous Myocardial Infarction, ETOH abuse; Risk                 Factors:Current Smoker, Dyslipidemia and Hypertension.  Sonographer:    Dondra Prader RVT RCS Referring Phys: [phone] LINDSAY B ROBERTS IMPRESSIONS  1. Left ventricular ejection fraction, by estimation, is 60 to 65%. The left ventricle has normal function. The left ventricle has no regional wall motion abnormalities. Left ventricular diastolic parameters were normal.  2. Right ventricular systolic function is normal. The right ventricular size is normal.  3. The mitral valve is normal in structure. Trivial mitral valve regurgitation. No evidence of mitral stenosis.  4. The aortic valve is normal in structure. Aortic valve regurgitation is not visualized. No aortic stenosis is present.  5. The inferior vena cava is normal in size with greater than 50% respiratory variability, suggesting right atrial pressure of 3 mmHg. FINDINGS  Left Ventricle: Left ventricular ejection fraction, by estimation, is 60 to 65%. The left ventricle has normal function. The left ventricle has no regional wall motion abnormalities. The left ventricular internal cavity size was normal in size. There is  no left ventricular hypertrophy. Left ventricular diastolic parameters were normal. Right Ventricle:  The right ventricular size is normal. No increase in right ventricular wall thickness. Right ventricular systolic function is normal. Left Atrium: Left atrial size was normal in size. Right Atrium: Right atrial size was normal in size. Pericardium: Trivial pericardial effusion is present. Mitral Valve: The mitral valve is normal in structure. Trivial mitral valve regurgitation. No evidence of mitral valve stenosis. Tricuspid Valve: The tricuspid valve is normal in structure. Tricuspid valve regurgitation is not  demonstrated. No evidence of tricuspid stenosis. Aortic Valve: The aortic valve is normal in structure. Aortic valve regurgitation is not visualized. No aortic stenosis is present. Aortic valve mean gradient measures 2.0 mmHg. Aortic valve peak gradient measures 3.9 mmHg. Aortic valve area, by VTI measures 2.06 cm. Pulmonic Valve: The pulmonic valve was normal in structure. Pulmonic valve regurgitation is not visualized. No evidence of pulmonic stenosis. Aorta: The aortic root is normal in size and structure. Venous: The inferior vena cava is normal in size with greater than 50% respiratory variability, suggesting right atrial pressure of 3 mmHg. IAS/Shunts: No atrial level shunt detected by color flow Doppler.  LEFT VENTRICLE PLAX 2D LVIDd:         4.10 cm   Diastology LVIDs:         2.70 cm   LV e' medial:    6.32 cm/s LV PW:         1.00 cm   LV E/e' medial:  9.3 LV IVS:        1.00 cm   LV e' lateral:   9.34 cm/s LVOT diam:     2.00 cm   LV E/e' lateral: 6.3 LV SV:         45 LV SV Index:   22 LVOT Area:     3.14 cm  RIGHT VENTRICLE             IVC RV Basal diam:  2.90 cm     IVC diam: 1.40 cm RV S prime:     12.70 cm/s TAPSE (M-mode): 2.4 cm LEFT ATRIUM             Index        RIGHT ATRIUM           Index LA diam:        2.90 cm 1.43 cm/m   RA Area:     13.60 cm LA Vol (A2C):   48.2 ml 23.76 ml/m  RA Volume:   28.30 ml  13.95 ml/m LA Vol (A4C):   45.5 ml 22.43 ml/m LA Biplane Vol: 50.9 ml 25.10 ml/m  AORTIC VALVE                    PULMONIC VALVE AV Area (Vmax):    2.15 cm     PV Vmax:       1.07 m/s AV Area (Vmean):   2.09 cm     PV Peak grad:  4.6 mmHg AV Area (VTI):     2.06 cm AV Vmax:           98.20 cm/s AV Vmean:          64.200 cm/s AV VTI:            0.218 m AV Peak Grad:      3.9 mmHg AV Mean Grad:      2.0 mmHg LVOT Vmax:         67.30 cm/s LVOT Vmean:        42.700 cm/s LVOT VTI:  0.143 m LVOT/AV VTI ratio: 0.66  AORTA Ao Root diam: 3.30 cm MITRAL VALVE MV Area (PHT): 3.37 cm     SHUNTS MV Decel Time: 225 msec    Systemic VTI:  0.14 m MV E velocity: 58.70 cm/s  Systemic Diam: 2.00 cm MV A velocity: 75.10 cm/s MV E/A ratio:  0.78 Aditya Sabharwal Electronically signed by Dorthula Nettles Signature Date/Time: 07/22/2023/11:30:02 AM    Final    CARDIAC CATHETERIZATION Result Date: 07/22/2023 Conclusions: Severe two-vessel coronary artery disease, as detailed below, including 90% mid LAD in-stent restenosis and 99% distal LCx/LPAV stenosis.  There is also moderate disease involving the mid LAD distal to the previously placed stent and in the proximal/distal RCA. Patent LAD stent with 90% in-stent restenosis, as detailed above. Patent overlapping LCx/OM1 stents with 30-40% focal in-stent restenosis in OM1. Normal left ventricular filling pressure (LVEDP 10 mmHg). Successful IVUS-guided PCI to in-stent restenosis and de novo disease involving mid LAD using Synergy XD 3.5 x 28 mm drug-eluting stent with 0% residual stenosis and TIMI-3 flow. Recommendations: Dual antiplatelet therapy with aspirin and ticagrelor for at least 12 months. Aggressive secondary prevention of coronary artery disease. Continue medical therapy for severe disease involving ramus intermedius and distal LCx, as these vessels are too small for intervention. Yvonne Kendall, MD [facility]    Cardiac Studies     Patient Profile     68 y.o. 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 CAD/NSTEMI: Pt admitted with chest pain and mild troponin elevation. Cardiac cath 07/22/23 with severe restenosis in the mid LAD stented segment. This was treated with a drug eluting stent. Plans for medical management of disease in the distal Circumflex and RCA. Echo with normal LV systolic function. Plans to continue DAPT with ASA and Brilinta for one year. Continue statin. Resume beta blocker today.   2. Essential HTN: BP was controlled yesterday but elevated this am. Will continue amlodipine 10 mg daily and restart home dose of Coreg at 6.25 mg po BID. Will not resume his Lisinopril given his CKD.    3. HLD: Continue statin.    4. CKD 3a: Stable.   Will d/c home today. Follow up with Dr. Allyson Sabal  Discharge meds:  New: Brilinta, Norvasc Do not resume: Lisinopril    For questions or updates, please contact [facility] Please consult [email or web address] for contact info under      Signed, Verne Carrow, MD  07/23/2023, 8:06 AM

## 2023-07-24 NOTE — Telephone Encounter (Signed)
Attempted to call patient, no answer left message requesting a call back.

## 2023-07-25 NOTE — Telephone Encounter (Signed)
Patient identification verified by 2 forms. Marilynn Rail, RN    Patient contacted regarding discharge from [facility] on 07/23/23  Patient understands to follow up with provider Np Monge on 07/30/23 at 2:45pm at [facility] Patient understands discharge instructions? Yes  Patient understands medications and regiment? Yes Patient understands to bring all medications to this visit? Yes

## 2023-07-30 ENCOUNTER — Telehealth (HOSPITAL_COMMUNITY): Payer: Self-pay

## 2023-07-30 ENCOUNTER — Ambulatory Visit: Payer: Medicare Other | Attending: Nurse Practitioner | Admitting: Nurse Practitioner

## 2023-07-30 ENCOUNTER — Encounter: Payer: Self-pay | Admitting: Nurse Practitioner

## 2023-07-30 VITALS — BP 126/88 | HR 93 | Ht 74.0 in | Wt 173.8 lb

## 2023-07-30 DIAGNOSIS — I10 Essential (primary) hypertension: Secondary | ICD-10-CM | POA: Diagnosis not present

## 2023-07-30 DIAGNOSIS — Z86011 Personal history of benign neoplasm of the brain: Secondary | ICD-10-CM

## 2023-07-30 DIAGNOSIS — N1831 Chronic kidney disease, stage 3a: Secondary | ICD-10-CM

## 2023-07-30 DIAGNOSIS — E785 Hyperlipidemia, unspecified: Secondary | ICD-10-CM | POA: Diagnosis not present

## 2023-07-30 DIAGNOSIS — I739 Peripheral vascular disease, unspecified: Secondary | ICD-10-CM

## 2023-07-30 DIAGNOSIS — Z720 Tobacco use: Secondary | ICD-10-CM

## 2023-07-30 DIAGNOSIS — I2510 Atherosclerotic heart disease of native coronary artery without angina pectoris: Secondary | ICD-10-CM | POA: Diagnosis not present

## 2023-07-30 DIAGNOSIS — C61 Malignant neoplasm of prostate: Secondary | ICD-10-CM

## 2023-07-30 DIAGNOSIS — F1029 Alcohol dependence with unspecified alcohol-induced disorder: Secondary | ICD-10-CM

## 2023-07-30 MED ORDER — AMLODIPINE BESYLATE 10 MG PO TABS: 10.0000 mg | ORAL_TABLET | Freq: Every day | ORAL | 3 refills | Status: AC

## 2023-07-30 MED ORDER — CARVEDILOL 6.25 MG PO TABS: 6.2500 mg | ORAL_TABLET | Freq: Two times a day (BID) | ORAL | 3 refills | Status: AC

## 2023-07-30 MED ORDER — ASPIRIN 81 MG PO TBEC: 81.0000 mg | DELAYED_RELEASE_TABLET | Freq: Every day | ORAL | 3 refills | Status: AC

## 2023-07-30 MED ORDER — TICAGRELOR 90 MG PO TABS: 90.0000 mg | ORAL_TABLET | Freq: Two times a day (BID) | ORAL | 3 refills | Status: AC

## 2023-07-30 NOTE — Telephone Encounter (Signed)
 Pt insurance is active and benefits verified through [facility] Medicare. Co-pay $0.00, DED $0.00/$0.00 met, out of pocket $5,500.00/$0.00 met, co-insurance 0%. No pre-authorization required. [employer], 07/30/23 @ 10:40AM, REF#[id number]   How many CR sessions are covered? (36 visits for TCR, 72 visits for ICR)72 Is this a lifetime maximum or an annual maximum? Annual Has the member used any of these services to date? No Is there a time limit (weeks/months) on start of program and/or program completion? No   2ndary insurance is active and benefits verified through [facility] Medicaid. Co-pay $0.00, DED $0.00/$0.00 met, out of pocket $0.00/$0.00 met, co-insurance 0%. No pre-authorization required. [employer], 07/30/23 @ 10:44AM, [phone]      Will contact patient to see if he is interested in the Cardiac Rehab Program. If interested, patient will need to complete follow up appt. Once completed, patient will be contacted for scheduling upon review by the RN Navigator.

## 2023-07-30 NOTE — Progress Notes (Signed)
 Office Visit    Patient Name: [name] [name] Date of Encounter: 07/30/2023  Primary Care Provider:  Sharyne Harlene CROME, NP Primary Cardiologist:  Dorn Lesches, MD  Chief Complaint    68 year old male with a history of CAD s/p BMS-LAD in 2011 [facility], in [place]), NSTEMI in 2019 s/p DES x 2 overlapping-OM/LCx, NSTEMI s/p PCI/DES-mid LAD (ISR) in 06/2023, mild mitral valve regurgitation, mild bilateral carotid artery stenosis, mild dilation of infrarenal arteries, bilateral common iliac arteries by CT in 01/2023, hypertension, hyperlipidemia, CKD stage IIIa, anemia by labs, brain tumor/meningioma by MRI in 2018, remote basal ganglia infarct by MRI brain in 2018, prostate cancer with bony metastases, habitual EtOH use, tobacco use, and prior non-adherence who presents for hospital follow-up laded to NSTEMI s/p PCI/DES-mid LAD (ISR).  Past Medical History    Past Medical History:  Diagnosis Date   Alcohol use    Basal ganglia infarction [facility])    Carotid artery disease (HCC)    Coronary artery disease 2011   s/p BMS stent LAD, [facility], [facility], [place], Dr GORMAN. [name]   Depression    Dilation of aorta (HCC)    High cholesterol    History of herniated intervertebral disc    HTN (hypertension)    Hyperlipidemia LDL goal <70 07/21/2013   Iliac artery aneurysm [facility])    Lung nodule    Meningioma (HCC)    NSTEMI (non-ST elevated myocardial infarction) (HCC) 11/11/2017   Prostate cancer metastatic to bone (HCC)    Smoker    1/2 ppd x >40 years    Past Surgical History:  Procedure Laterality Date   CORONARY ANGIOPLASTY WITH STENT PLACEMENT Left 2011   BMS LAD, [facility], [facility], [place], Dr GORMAN. [name]   CORONARY STENT INTERVENTION N/A 11/12/2017   Procedure: CORONARY STENT INTERVENTION;  Surgeon: Anner Alm ORN, MD;  Location: [facility] INVASIVE CV LAB;  Service: Cardiovascular;  Laterality: N/A;   CORONARY STENT INTERVENTION N/A 07/22/2023   Procedure:  CORONARY STENT INTERVENTION;  Surgeon: Mady Bruckner, MD;  Location: [facility] INVASIVE CV LAB;  Service: Cardiovascular;  Laterality: N/A;   CORONARY ULTRASOUND/IVUS N/A 07/22/2023   Procedure: Coronary Ultrasound/IVUS;  Surgeon: Mady Bruckner, MD;  Location: [facility] INVASIVE CV LAB;  Service: Cardiovascular;  Laterality: N/A;   LEFT HEART CATH AND CORONARY ANGIOGRAPHY N/A 11/12/2017   Procedure: LEFT HEART CATH AND CORONARY ANGIOGRAPHY;  Surgeon: Anner Alm ORN, MD;  Location: [facility] INVASIVE CV LAB;  Service: Cardiovascular;  Laterality: N/A;   LEFT HEART CATH AND CORONARY ANGIOGRAPHY N/A 07/22/2023   Procedure: LEFT HEART CATH AND CORONARY ANGIOGRAPHY;  Surgeon: Mady Bruckner, MD;  Location: [facility] INVASIVE CV LAB;  Service: Cardiovascular;  Laterality: N/A;    Allergies  No Known Allergies   Labs/Other Studies Reviewed    The following studies were reviewed today:  Cardiac Studies & Procedures   CARDIAC CATHETERIZATION  CARDIAC CATHETERIZATION 07/22/2023  Narrative Conclusions: Severe two-vessel coronary artery disease, as detailed below, including 90% mid LAD in-stent restenosis and 99% distal LCx/LPAV stenosis.  There is also moderate disease involving the mid LAD distal to the previously placed stent and in the proximal/distal RCA. Patent LAD stent with 90% in-stent restenosis, as detailed above. Patent overlapping LCx/OM1 stents with 30-40% focal in-stent restenosis in OM1. Normal left ventricular filling pressure (LVEDP 10 mmHg). Successful IVUS-guided PCI to in-stent restenosis and de novo disease involving mid LAD using Synergy XD 3.5 x 28 mm drug-eluting stent with 0% residual stenosis and TIMI-3  flow.  Recommendations: Dual antiplatelet therapy with aspirin  and ticagrelor  for at least 12 months. Aggressive secondary prevention of coronary artery disease. Continue medical therapy for severe disease involving ramus intermedius and distal LCx, as these vessels are too small for  intervention.  Lonni Hanson, MD [facility]  Findings Coronary Findings Diagnostic  Dominance: Right  Left Main Vessel is large.  Left Anterior Descending Prox LAD-1 lesion is 90% stenosed. The lesion was previously treated using a bare metal stent over 2 years ago. Previously placed stent displays restenosis. Ultrasound (IVUS) was performed. Severe plaque burden was detected. The lesion has a fibro-fatty core. Prox LAD-2 lesion is 30% stenosed. The lesion is eccentric. Ultrasound (IVUS) was performed. The lesion has a fibro-fatty core.  First Diagonal Branch Vessel is small in size.  Second Diagonal Branch Vessel is moderate in size.  Third Diagonal Branch Vessel is small in size.  Ramus Intermedius Vessel is small. There is mild diffuse disease throughout the vessel.  Left Circumflex Vessel is large. Non-stenotic Prox Cx-1 lesion was previously treated. Vessel is not the culprit lesion. Non-stenotic Prox Cx-2 lesion was previously treated. Non-stenotic Mid Cx-1 lesion was previously treated. Vessel is the culprit lesion. The lesion is distal to major branch, eccentric, irregular and ulcerative. Mid Cx-2 lesion is 35% stenosed. Vessel is not the culprit lesion. The lesion is focal. The lesion was previously treated using a drug eluting stent over 2 years ago. Previously placed stent displays restenosis.  Left Posterior Atrioventricular Artery Vessel is small in size. ~ 2mm LPAV lesion is 99% stenosed. The lesion is focal. In a small caliber vessel - NOT good PCI target  Right Coronary Artery Vessel is large. Prox RCA lesion is 30% stenosed. Dist RCA lesion is 50% stenosed. The lesion is irregular.  Acute Marginal Branch Vessel is small in size.  First Right Posterolateral Branch Vessel is small in size.  Intervention  Prox LAD-1 lesion Stent (Also treats lesions: Prox LAD-2) CATH LAUNCHER 6FR EBU3.5 guide catheter was inserted. Lesion crossed with  guidewire using a WIRE RUNTHROUGH .[id number]. Pre-stent angioplasty was performed using a BALLN SCOREFLEX 3.0X15. Maximum pressure:  16 atm. A drug-eluting stent was successfully placed using a SYNERGY XD 3.50X28. Maximum pressure: 16 atm. Post-stent angioplasty was performed using a BALLN [place] EMERGE MR [id number]. Maximum pressure:  18 atm. Post-Intervention Lesion Assessment The intervention was successful. Pre-interventional TIMI flow is 3. Post-intervention TIMI flow is 3. No complications occurred at this lesion. Ultrasound (IVUS) was performed on the lesion post PCI. Stent well expanded. There is a 0% residual stenosis post intervention.  Prox LAD-2 lesion Stent (Also treats lesions: Prox LAD-1) See details in Prox LAD-1 lesion. Post-Intervention Lesion Assessment The intervention was successful. Pre-interventional TIMI flow is 3. Post-intervention TIMI flow is 3. No complications occurred at this lesion. Ultrasound (IVUS) was performed on the lesion post PCI using a CATH OPTICROSS HD. Stent well expanded. There is a 0% residual stenosis post intervention.   CARDIAC CATHETERIZATION  CARDIAC CATHETERIZATION 11/12/2017  Narrative Images from the original result were not included.   The left ventricular systolic function is normal.  LV end diastolic pressure is normal.  The left ventricular ejection fraction is 55-65% by visual estimate.  Ost Ramus to Ramus lesion is 80% stenosed. LAV Groove lesion is 95% stenosed. -- Plan Medical Management.  CULPRIT LESIONS: Treated with 2 overlapping DES stents  Mid Cx-1 lesion is 95% stenosed. Mid Cx-2 lesion is 80% stenosed.  A drug-eluting stent was successfully placed  using a STENT SYNERGY DES 2.5X24. - post-dilated to 3.1-2.7 mm  Post intervention, there is a 0% residual stenosis.  Prox Cx-1 lesion is 70% stenosed. Prox Cx-2 lesion is 80% stenosed. (overlapping proximally)  A bare metal stent was successfully placed using a STENT SYNERGY  DES 2.5X24. - post-dilated to 3.1 mm  Post intervention, there is a 0% residual stenosis.  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 Small caliber Ramus & AV Groove Cx vessels with 80% & 95% lesions to be treated medically. Mild ISR of pLAD stent. Normal EF.   PLAN OF CARE: Transfer to 6 Central post procedure unit.  Anticipate discharge tomorrow.  Needs aggressive risk factor modification with blood pressure, lipid and glycemic control.  Stressed importance of medical adherence:  DAPT x1 year    Alm Clay, MD Alm Clay, M.D., M.S. Interventional Cardiologist  Pager # [phone] Phone # [phone] [address] [place], [place] [address]  Findings Coronary Findings Diagnostic  Dominance: Right  Left Main Vessel is large.  Left Anterior Descending Prox LAD lesion is 25% stenosed. The lesion is located proximal to the major branch and eccentric. The lesion was previously treated using a bare metal stent over 2 years ago. Previously placed stent displays restenosis.  First Diagonal Branch Vessel is small in size.  Second Diagonal Branch Vessel is moderate in size.  Third Diagonal Branch Vessel is small in size.  Ramus Intermedius Ost Ramus to Ramus lesion is 80% stenosed. The lesion is focal and eccentric. Very small caliber vessel.  Best treated medically.  Left Circumflex Vessel is large. Prox Cx-1 lesion is 70% stenosed. Vessel is not the culprit lesion. The lesion is located proximal to the major branch, focal and eccentric. Prox Cx-2 lesion is 80% stenosed. The lesion is discrete. Mid Cx-1 lesion is 95% stenosed. Vessel is the culprit lesion. The lesion is distal to major branch, eccentric, irregular and ulcerative. Mid Cx-2 lesion is 80% stenosed. The lesion is discrete. 2 discrete lesions 80 % 70%  Left Posterior Atrioventricular Artery Vessel is small in size. ~ 2mm LAV Groove lesion is 95%  stenosed. The lesion is focal and eccentric. In a small caliber vessel - NOT good PCI target  Right Coronary Artery Vessel is large.  Acute Marginal Branch Vessel is small in size.  First Right Posterolateral Branch Vessel is small in size.  Intervention  Prox Cx-1 lesion Stent (Also treats lesions: Prox Cx-2) Lesion length:  22 mm. Lesion crossed with guidewire using a WIRE ASAHI PROWATER 180CM. Pre-stent angioplasty was performed using a STENT SYNERGY DES 2.5X24. Maximum pressure:  12 atm. Inflation time:  20 sec. Stent Balloon from initial stent A bare metal stent was successfully placed using a STENT SYNERGY DES 2.5X24. Maximum pressure: 14 atm. Inflation time: 30 sec. Minimum lumen area:  3.1 mm. Stent strut is well apposed. Stent overlaps previously placed stent. Post-stent angioplasty was performed using a BALLOON SAPPHIRE [place] 3.0X18. Maximum pressure:  18 atm. Inflation time:  20 sec. Post-Intervention Lesion Assessment The intervention was successful. Pre-interventional TIMI flow is 3. Post-intervention TIMI flow is 3. Treated lesion length:  24 mm. No complications occurred at this lesion. There is a 0% residual stenosis post intervention.  Prox Cx-2 lesion Stent (Also treats lesions: Prox Cx-1) See details in Prox Cx-1 lesion. Post-Intervention Lesion Assessment The intervention was successful. Pre-interventional TIMI flow is 3. Post-intervention TIMI flow is 3. Treated lesion length:  24 mm. No complications  occurred at this lesion. There is a 0% residual stenosis post intervention.  Mid Cx-1 lesion Stent (Also treats lesions: Mid Cx-2) Lesion length:  22 mm. Lesion crossed with guidewire using a WIRE ASAHI PROWATER 180CM. Pre-stent angioplasty was performed using a BALLOON EMERGE MR 2.0X15. Maximum pressure:  12 atm. Inflation time:  20 sec. A drug-eluting stent was successfully placed using a STENT SYNERGY DES 2.5X24. Maximum pressure: 14 atm. Inflation time: 30 sec.  Minimum lumen area:  2.7 mm. Stent strut is well apposed. Post-stent angioplasty was performed using a BALLOON SAPPHIRE [place] 3.0X18. Maximum pressure:  16 atm. Inflation time:  20 sec. Distal vessel with Stent Balloon to High (18) Atm. Proximal with 3.0 mm [place] balloon. Post-Intervention Lesion Assessment The intervention was successful. Pre-interventional TIMI flow is 3. Treated lesion length:  24 mm. No complications occurred at this lesion. There is a 0% residual stenosis post intervention.  Mid Cx-2 lesion Stent (Also treats lesions: Mid Cx-1) See details in Mid Cx-1 lesion. Post-Intervention Lesion Assessment The intervention was successful. Pre-interventional TIMI flow is 3. Post-intervention TIMI flow is 3. Treated lesion length:  24 mm. No complications occurred at this lesion. There is a 0% residual stenosis post intervention.    ECHOCARDIOGRAM  ECHOCARDIOGRAM COMPLETE 07/22/2023  Narrative ECHOCARDIOGRAM REPORT    Patient Name:   [name] [name] Date of Exam: 07/22/2023 Medical Rec #:  [id number]        Height:       74.0 in Accession #:    [id number]       Weight:       170.0 lb Date of Birth:  [birth year]         BSA:          2.028 m Patient Age:    67 years         BP:           168/83 mmHg Patient Gender: M                HR:           59 bpm. Exam Location:  Inpatient  Procedure: 2D Echo, Cardiac Doppler and Color Doppler  Indications:    Chest pain R07.9  History:        Patient has prior history of Echocardiogram examinations. CAD and Previous Myocardial Infarction, ETOH abuse; Risk Factors:Current Smoker, Dyslipidemia and Hypertension.  Sonographer:    Tillman Nora RVT RCS Referring Phys: [phone] LINDSAY B ROBERTS  IMPRESSIONS   1. Left ventricular ejection fraction, by estimation, is 60 to 65%. The left ventricle has normal function. The left ventricle has no regional wall motion abnormalities. Left ventricular diastolic parameters were normal. 2. Right  ventricular systolic function is normal. The right ventricular size is normal. 3. The mitral valve is normal in structure. Trivial mitral valve regurgitation. No evidence of mitral stenosis. 4. The aortic valve is normal in structure. Aortic valve regurgitation is not visualized. No aortic stenosis is present. 5. The inferior vena cava is normal in size with greater than 50% respiratory variability, suggesting right atrial pressure of 3 mmHg.  FINDINGS Left Ventricle: Left ventricular ejection fraction, by estimation, is 60 to 65%. The left ventricle has normal function. The left ventricle has no regional wall motion abnormalities. The left ventricular internal cavity size was normal in size. There is no left ventricular hypertrophy. Left ventricular diastolic parameters were normal.  Right Ventricle: The right ventricular size is normal. No increase in right ventricular wall thickness.  Right ventricular systolic function is normal.  Left Atrium: Left atrial size was normal in size.  Right Atrium: Right atrial size was normal in size.  Pericardium: Trivial pericardial effusion is present.  Mitral Valve: The mitral valve is normal in structure. Trivial mitral valve regurgitation. No evidence of mitral valve stenosis.  Tricuspid Valve: The tricuspid valve is normal in structure. Tricuspid valve regurgitation is not demonstrated. No evidence of tricuspid stenosis.  Aortic Valve: The aortic valve is normal in structure. Aortic valve regurgitation is not visualized. No aortic stenosis is present. Aortic valve mean gradient measures 2.0 mmHg. Aortic valve peak gradient measures 3.9 mmHg. Aortic valve area, by VTI measures 2.06 cm.  Pulmonic Valve: The pulmonic valve was normal in structure. Pulmonic valve regurgitation is not visualized. No evidence of pulmonic stenosis.  Aorta: The aortic root is normal in size and structure.  Venous: The inferior vena cava is normal in size with greater than  50% respiratory variability, suggesting right atrial pressure of 3 mmHg.  IAS/Shunts: No atrial level shunt detected by color flow Doppler.   LEFT VENTRICLE PLAX 2D LVIDd:         4.10 cm   Diastology LVIDs:         2.70 cm   LV e' medial:    6.32 cm/s LV PW:         1.00 cm   LV E/e' medial:  9.3 LV IVS:        1.00 cm   LV e' lateral:   9.34 cm/s LVOT diam:     2.00 cm   LV E/e' lateral: 6.3 LV SV:         45 LV SV Index:   22 LVOT Area:     3.14 cm   RIGHT VENTRICLE             IVC RV Basal diam:  2.90 cm     IVC diam: 1.40 cm RV S prime:     12.70 cm/s TAPSE (M-mode): 2.4 cm  LEFT ATRIUM             Index        RIGHT ATRIUM           Index LA diam:        2.90 cm 1.43 cm/m   RA Area:     13.60 cm LA Vol (A2C):   48.2 ml 23.76 ml/m  RA Volume:   28.30 ml  13.95 ml/m LA Vol (A4C):   45.5 ml 22.43 ml/m LA Biplane Vol: 50.9 ml 25.10 ml/m AORTIC VALVE                    PULMONIC VALVE AV Area (Vmax):    2.15 cm     PV Vmax:       1.07 m/s AV Area (Vmean):   2.09 cm     PV Peak grad:  4.6 mmHg AV Area (VTI):     2.06 cm AV Vmax:           98.20 cm/s AV Vmean:          64.200 cm/s AV VTI:            0.218 m AV Peak Grad:      3.9 mmHg AV Mean Grad:      2.0 mmHg LVOT Vmax:         67.30 cm/s LVOT Vmean:        42.700 cm/s LVOT VTI:  0.143 m LVOT/AV VTI ratio: 0.66  AORTA Ao Root diam: 3.30 cm  MITRAL VALVE MV Area (PHT): 3.37 cm    SHUNTS MV Decel Time: 225 msec    Systemic VTI:  0.14 m MV E velocity: 58.70 cm/s  Systemic Diam: 2.00 cm MV A velocity: 75.10 cm/s MV E/A ratio:  0.78  Aditya Sabharwal Electronically signed by Ria Commander Signature Date/Time: 07/22/2023/11:30:02 AM    Final            Recent Labs: 07/20/2023: ALT 16; TSH 1.190 07/21/2023: B Natriuretic Peptide 18.9 07/23/2023: BUN 24; Creatinine, Ser 1.51; Hemoglobin 11.8; Platelets 189; Potassium 4.3; Sodium 136  Recent Lipid Panel    Component Value Date/Time   CHOL  267 (H) 07/20/2023 0213   CHOL 272 (H) 04/28/2021 1053   TRIG 194 (H) 07/20/2023 0213   HDL 32 (L) 07/20/2023 0213   HDL 35 (L) 04/28/2021 1053   CHOLHDL 8.3 07/20/2023 0213   VLDL 39 07/20/2023 0213   LDLCALC 196 (H) 07/20/2023 0213   LDLCALC 164 (H) 04/28/2021 1053    History of Present Illness    68 year old male with with the above past medical history including CAD s/p BMS-LAD in 2011 [facility], in [place]), NSTEMI in 2019 s/p DES x 2 overlapping-OM/LCx, NSTEMI s/p PCI/DES-mid LAD (ISR) in 06/2023, mild mitral valve regurgitation, mild bilateral carotid artery stenosis, mild dilation of infrarenal arteries, bilateral common iliac arteries by CT in 01/2023, hypertension, hyperlipidemia, CKD stage IIIa, anemia by labs, brain tumor/meningioma by MRI in 2018, remote basal ganglia infarct by MRI brain in 2018, prostate cancer with bony metastases, habitual EtOH use, tobacco use and prior non-adherence.  He has a history of extensive CAD as above.  Cardiac catheterization in 2019 in the setting of NSTEMI revealed extensive LCx/OM disease treated with 2 overlapping DES.  He had residual small caliber ramus and AV groove circumflex vessel disease with 80% and 95% stenosis, manage medically, mild ISR of proximal LAD stent with normal EF.  Echocardiogram in 2021 showed EF 60 to 65%, G1 DD, mild MR, mild aortic sclerosis without evidence of aortic stenosis.  Stress test in 2021 was ordered but not completed.  He has a history of meningioma diagnosed in 2018, follows with neurosurgery.  He follows with oncology for history of prostate cancer with metastases to bone, this was felt to be stable at last office visit in 04/2023.  He has a history of intermittent follow-up and nonadherence.  He was last seen in the office on 08/22/2022 and was stable from a cardiac standpoint. He denied symptoms concerning for angina. He was not taking his medication as prescribed.  He was hospitalized from 07/19/2023 to  07/23/2023 in the setting of NSTEMI.  He noted a 4-day history of shortness of breath with exertion, intermittent chest pain.  Troponin was elevated, cardiology was consulted.  Chest x-ray was unremarkable.  UDS was negative.  EKG showed sinus rhythm with diffuse ST/T wave changes.  He again reported nonadherence to his medication regimen.  He underwent cardiac catheterization which showed severe stenosis of the mid LAD and previously stented segment, s/p DES x 1 (ISR).  He was noted to have residual disease in the distal left circumflex and RCA, medical management was recommended.  He was placed on DAPT with aspirin  and Brilinta .  Home lisinopril  was not resumed in the setting of CKD.  Carvedilol , and amlodipine  were continued.  Echocardiogram showed EF 60 to 65%, normal LV function, no RWMA, normal RV  systolic function, no significant valvular abnormalities.    He was discharged home in stable condition on 07/23/2023.  He presents today for follow-up.  Since his hospitalization he has been stable from a cardiac standpoint. He reports stable chronic dyspnea, denies chest pain.  He has not been taking his carvedilol , HR is mildly elevated in office today.  BP is stable.  He notes occasional lightheadedness with position changes. He denies palpitations, presyncope, syncope, edema, PND, orthopnea, weight gain.   Home Medications    Current Outpatient Medications  Medication Sig Dispense Refill   atorvastatin  (LIPITOR ) 80 MG tablet Take 1 tablet (80 mg total) by mouth daily. 90 tablet 1   calcium -vitamin D  (OSCAL WITH D) 500-200 MG-UNIT tablet Take 1 tablet by mouth daily with breakfast. 90 tablet 3   nitroGLYCERIN  (NITROSTAT ) 0.4 MG SL tablet Place 1 tablet (0.4 mg total) under the tongue every 5 (five) minutes as needed for chest pain. 25 tablet 2   amLODipine  (NORVASC ) 10 MG tablet Take 1 tablet (10 mg total) by mouth daily. 90 tablet 3   aspirin  EC 81 MG tablet Take 1 tablet (81 mg total) by mouth daily.  90 tablet 3   carvedilol  (COREG ) 6.25 MG tablet Take 1 tablet (6.25 mg total) by mouth 2 (two) times daily with a meal. 180 tablet 3   ticagrelor  (BRILINTA ) 90 MG TABS tablet Take 1 tablet (90 mg total) by mouth 2 (two) times daily. 180 tablet 3   No current facility-administered medications for this visit.     Review of Systems    He denies chest pain, palpitations, pnd, orthopnea, n, v, dizziness, syncope, edema, weight gain, or early satiety. All other systems reviewed and are otherwise negative except as noted above.     Cardiac Rehabilitation Eligibility Assessment  The patient is ready to start cardiac rehabilitation from a cardiac standpoint.    Physical Exam    VS:  BP 126/88   Pulse 93   Ht 6' 2 (1.88 m)   Wt 173 lb 12.8 oz (78.8 kg)   SpO2 99%   BMI 22.31 kg/m   GEN: Well nourished, well developed, in no acute distress. HEENT: normal. Neck: Supple, no JVD, carotid bruits, or masses. Cardiac: RRR, no murmurs, rubs, or gallops. No clubbing, cyanosis, edema.  Radials/DP/PT 2+ and equal bilaterally.  Right radial cath site without bruising, bleeding, bruit or hematoma. Respiratory:  Respirations regular and unlabored, clear to auscultation bilaterally. GI: Soft, nontender, nondistended, BS + x 4. MS: no deformity or atrophy. Skin: warm and dry, no rash. Neuro:  Strength and sensation are intact. Psych: Normal affect.  Accessory Clinical Findings    ECG personally reviewed by me today - EKG Interpretation Date/Time:  Tuesday July 30 2023 14:43:09 EST Ventricular Rate:  93 PR Interval:  196 QRS Duration:  90 QT Interval:  340 QTC Calculation: 422 R Axis:   68  Text Interpretation: Normal sinus rhythm T wave abnormality, consider inferior ischemia When compared with ECG of 23-Jul-2023 07:04, T wave inversion no longer evident in Anterior leads Confirmed by Daneen Perkins ([id number]) on 07/30/2023 2:58:44 PM  - no acute changes.   Lab Results  Component Value Date    WBC 6.5 07/23/2023   HGB 11.8 (L) 07/23/2023   HCT 35.4 (L) 07/23/2023   MCV 84.1 07/23/2023   PLT 189 07/23/2023   Lab Results  Component Value Date   CREATININE 1.51 (H) 07/23/2023   BUN 24 (H) 07/23/2023   NA 136  07/23/2023   K 4.3 07/23/2023   CL 103 07/23/2023   CO2 24 07/23/2023   Lab Results  Component Value Date   ALT 16 07/20/2023   AST 19 07/20/2023   ALKPHOS 57 07/20/2023   BILITOT 0.6 07/20/2023   Lab Results  Component Value Date   CHOL 267 (H) 07/20/2023   HDL 32 (L) 07/20/2023   LDLCALC 196 (H) 07/20/2023   TRIG 194 (H) 07/20/2023   CHOLHDL 8.3 07/20/2023    Lab Results  Component Value Date   HGBA1C 5.8 (H) 07/21/2013    Assessment & Plan    1. CAD: S/p BMS-LAD in 2011 [facility], in [place]), NSTEMI in 2019 s/p DES x 2 overlapping-OM/LCx, NSTEMI s/p PCI/DES-mid LAD (ISR) in 06/2023. Echocardiogram showed EF 60 to 65%, normal LV function, no RWMA, normal RV systolic function, no significant valvular abnormalities.  He has stable chronic dyspnea, denies chest pain.  HR is mildly elevated in office today, he has not been taking carvedilol .  I advised him to begin taking this medication.  Continue aspirin , Brilinta , amlodipine , carvedilol , Lipitor .  2. Hypertension: BP well controlled. Continue current antihypertensive regimen.   3. Hyperlipidemia: LDL was 196 in 06/2023.  He was not taking his medication prior to admission.  Plan for repeat fasting lipids, CMET in 6-8 weeks.  Continue Lipitor .  4. CKD stage IIIa: Creatinine was stable at 1.51 in 06/2023. Repeat CMET pending as above.   5. PAD:  Carotid artery disease by duplex in 08/2022. mild dilation of infrarenal arteries/bilateral common iliac arteries by CT 01/2023. Asymptomatic.   6. EtOH/tobacco use: He notes he has quit drinking and smoking.  I congratulated him on this.  Ongoing cessation advised.  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  He has follow-up scheduled with follow-up neurosurgery/oncology.  8. Disposition: Follow-up in 3 months.       Damien JAYSON Braver, NP 07/30/2023, 3:17 PM

## 2023-07-30 NOTE — Telephone Encounter (Signed)
 Called patient to see if he is interested in the Cardiac Rehab Program. Patient expressed interest. Explained scheduling process and went over insurance, patient verbalized understanding. Will contact patient for scheduling once f/u has been completed.

## 2023-07-30 NOTE — Patient Instructions (Signed)
Medication Instructions:  Your physician recommends that you continue on your current medications as directed. Please refer to the Current Medication list given to you today.  *If you need a refill on your cardiac medications before your next appointment, please call your pharmacy*   Lab Work: Fasting lipid panel & CMET in 6 weeks.    Testing/Procedures: NONE ordered at this time of appointment   Follow-Up: At [facility], you and your health needs are our priority.  As part of our continuing mission to provide you with exceptional heart care, we have created designated Provider Care Teams.  These Care Teams include your primary Cardiologist (physician) and Advanced Practice Providers (APPs -  Physician Assistants and Nurse Practitioners) who all work together to provide you with the care you need, when you need it.  We recommend signing up for the patient portal called "MyChart".  Sign up information is provided on this After Visit Summary.  MyChart is used to connect with patients for Virtual Visits (Telemedicine).  Patients are able to view lab/test results, encounter notes, upcoming appointments, etc.  Non-urgent messages can be sent to your provider as well.   To learn more about what you can do with MyChart, go to [email or web address].    Your next appointment:   3 month(s)  Provider:   Nanetta Batty, MD  or Bernadene Person, NP

## 2023-07-31 ENCOUNTER — Telehealth: Payer: Self-pay | Admitting: Hematology and Oncology

## 2023-08-15 ENCOUNTER — Ambulatory Visit: Payer: Medicare Other

## 2023-08-15 ENCOUNTER — Ambulatory Visit: Payer: Medicare Other | Admitting: Hematology and Oncology

## 2023-08-15 ENCOUNTER — Telehealth: Payer: Self-pay | Admitting: *Deleted

## 2023-08-15 ENCOUNTER — Other Ambulatory Visit: Payer: Medicare Other

## 2023-08-15 NOTE — Telephone Encounter (Signed)
   Pre-operative Risk Assessment    Patient Name: [name] [name]  DOB: [birth year] MRN: [id number]   Date of last office visit: 07/30/2023 Date of next office visit: 10/28/2023  Request for Surgical Clearance    Procedure:   Cataract Surgery of the Right Eye  Date of Surgery:  Clearance 09/05/23                                 Surgeon:  Dr. Hollace Kinnier Surgeon's Group or Practice Name:  [facility] Phone number:  [phone] Fax number:  [phone]   Type of Clearance Requested:   - Medical  - Pharmacy:  Hold Aspirin and Ticagrelor (Brilinta) Pt does not have to hold   Type of Anesthesia:   Topical   Additional requests/questions:    Signed, Emmit Pomfret   08/15/2023, 1:00 PM

## 2023-08-15 NOTE — Telephone Encounter (Signed)
   Patient Name: [name] [name]  DOB: [birth year] MRN: [id number]  Primary Cardiologist: Nanetta Batty, MD  Chart reviewed as part of pre-operative protocol coverage. Given past medical history and time since last visit, based on ACC/AHA guidelines, Neythan Kozlov is at acceptable risk for the planned procedure without further cardiovascular testing.   Of note, patient is on Brilinta and ASA, but pre-op request states "patient does not have to hold" therefore, we will not comment on these medications.   The patient was advised that if he develops new symptoms prior to surgery to contact our office to arrange for a follow-up visit, and he verbalized understanding.  I will route this recommendation to the requesting party via Epic fax function and remove from pre-op pool.  Please call with questions.  Joni Reining, NP 08/15/2023, 2:08 PM

## 2023-08-22 ENCOUNTER — Inpatient Hospital Stay: Payer: Medicare Other

## 2023-08-22 ENCOUNTER — Inpatient Hospital Stay: Payer: Medicare Other | Admitting: Hematology and Oncology

## 2023-08-22 ENCOUNTER — Inpatient Hospital Stay: Payer: Medicare Other | Attending: Hematology and Oncology

## 2023-08-22 VITALS — BP 114/66 | HR 70 | Temp 97.6°F | Resp 17 | Ht 74.0 in | Wt 175.8 lb

## 2023-08-22 DIAGNOSIS — Z79899 Other long term (current) drug therapy: Secondary | ICD-10-CM | POA: Insufficient documentation

## 2023-08-22 DIAGNOSIS — C61 Malignant neoplasm of prostate: Secondary | ICD-10-CM

## 2023-08-22 DIAGNOSIS — Z5111 Encounter for antineoplastic chemotherapy: Secondary | ICD-10-CM | POA: Insufficient documentation

## 2023-08-22 DIAGNOSIS — C7951 Secondary malignant neoplasm of bone: Secondary | ICD-10-CM | POA: Diagnosis not present

## 2023-08-22 LAB — CBC WITH DIFFERENTIAL (CANCER CENTER ONLY)
Abs Immature Granulocytes: 0.03 10*3/uL (ref 0.00–0.07)
Basophils Absolute: 0.1 10*3/uL (ref 0.0–0.1)
Basophils Relative: 1 %
Eosinophils Absolute: 0.1 10*3/uL (ref 0.0–0.5)
Eosinophils Relative: 2 %
HCT: 32.2 % — ABNORMAL LOW (ref 39.0–52.0)
Hemoglobin: 10.2 g/dL — ABNORMAL LOW (ref 13.0–17.0)
Immature Granulocytes: 0 %
Lymphocytes Relative: 27 %
Lymphs Abs: 2.4 10*3/uL (ref 0.7–4.0)
MCH: 27.4 pg (ref 26.0–34.0)
MCHC: 31.7 g/dL (ref 30.0–36.0)
MCV: 86.6 fL (ref 80.0–100.0)
Monocytes Absolute: 0.6 10*3/uL (ref 0.1–1.0)
Monocytes Relative: 6 %
Neutro Abs: 5.7 10*3/uL (ref 1.7–7.7)
Neutrophils Relative %: 64 %
Platelet Count: 246 10*3/uL (ref 150–400)
RBC: 3.72 MIL/uL — ABNORMAL LOW (ref 4.22–5.81)
RDW: 14.1 % (ref 11.5–15.5)
WBC Count: 8.9 10*3/uL (ref 4.0–10.5)
nRBC: 0 % (ref 0.0–0.2)

## 2023-08-22 LAB — CMP (CANCER CENTER ONLY)
ALT: 19 U/L (ref 0–44)
AST: 17 U/L (ref 15–41)
Albumin: 4.4 g/dL (ref 3.5–5.0)
Alkaline Phosphatase: 88 U/L (ref 38–126)
Anion gap: 7 (ref 5–15)
BUN: 26 mg/dL — ABNORMAL HIGH (ref 8–23)
CO2: 26 mmol/L (ref 22–32)
Calcium: 9.6 mg/dL (ref 8.9–10.3)
Chloride: 106 mmol/L (ref 98–111)
Creatinine: 1.55 mg/dL — ABNORMAL HIGH (ref 0.61–1.24)
GFR, Estimated: 49 mL/min — ABNORMAL LOW (ref >60)
Glucose, Bld: 103 mg/dL — ABNORMAL HIGH (ref 70–99)
Potassium: 4.1 mmol/L (ref 3.5–5.1)
Sodium: 139 mmol/L (ref 135–145)
Total Bilirubin: 0.3 mg/dL (ref 0.0–1.2)
Total Protein: 8.1 g/dL (ref 6.5–8.1)

## 2023-08-22 MED ORDER — LEUPROLIDE ACETATE (3 MONTH) 22.5 MG ~~LOC~~ KIT
22.5000 mg | PACK | Freq: Once | SUBCUTANEOUS | Status: AC
  Administered 2023-08-22: 22.5 mg via SUBCUTANEOUS
  Filled 2023-08-22: qty 22.5

## 2023-08-22 NOTE — Progress Notes (Signed)
 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 Seen by pulmonary with plans for PET and biopsy, though he was lost to follow up.  3) 01/17/2020: sent to the ED by Orthopedic surgery due to metastasis of the spine noted on MRI. IN the ED CT C/A/P showed extensive left pelvic and retroperitoneal lymphadenopathy. Mediastinal and bilateral hilar lymphadenopathy as well as mixed lytic and sclerotic lesions are noted in the bony pelvis 4) 01/17/2020: PSA noted to be 1074.94 5) 01/22/2020: establish care with Dr. Leonides Schanz. Started Bicalutamide 50mg  PO daily. Plan for 28 days of therapy.   6) 02/05/2020: Lupron 22.5mg  started.  7) 03/03/2020: received 45mg  lupron with urology. PSA 9.6 8) 03/30/2020: prostate biopsy performed, confirmed adenocarcinoma of the prostate.  9) 04/25/2020: stopped Zytiga therapy after < 7 days due to worsening depression 10) 04/28/2020: received enzalutamide 160mg , did not take any doses 11) 06/15/2020: agreed to restart Zytiga 1000mg  daily with prednisone 5mg   12) 09/08/2020: Lupron 22.5mg  shot.  13) 12/02/2020: Lupron 22.5mg  shot.  14) 03/01/2021: Lupron 22.5mg  shot. 15) 05/24/2021: Lupron 22.5mg  shot. 16) 08/16/2021: Lupron 22.5mg  shot. 17) 11/15/2021: Lupron 22.5 mg shot. Patient reports he is not taking zytiga as prescribed.  18) 02/16/2022: Lupron 22.5mg  shot. 19) 05/21/2022: Lupron 22.5mg  shot. 20) 08/21/2022: Lupron 22.5mg   shot. 21) 11/20/2022: Lupron 22.5mg  shot.  Interval History:  [name] [name] 68 y.o. male with medical history significant for metastatic prostate cancer who presents for a follow up visit. The patient's last visit was on 05/15/2023. In the interim since the last visit [name] [name] has continued on Lupron therapy but declines to continue Zytiga or enzalutamide  On exam today [name] [name] reports he needs a cataract surgery in his right eye and is currently working towards having this done.  He reports he is not currently in any pain but does have shortness of breath on occasion.  He reports his appetite is strong and he is eating well.  He reports his energy is about the same though he does have some bouts of fatigue.  He is not having any lightheadedness or dizziness.  He is tolerating his Lupron shots well and is not having any major side effects or symptoms.  He reports that he did recently have a heart attack and started on aspirin and Brilinta.  He reports he is not having any trouble with bleeding, bruising, or dark stools.  He reports that if his PSA remains elevated he would be interested in restarting his Zytiga therapy.. Full 10 point ROS is listed below.  Overall he is willing and able to proceed with Lupron shots at this time.  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 [name]   CORONARY STENT INTERVENTION N/A 11/12/2017   Procedure: CORONARY STENT INTERVENTION;  Surgeon: Marykay Lex, MD;  Location: [facility] INVASIVE CV LAB;  Service: Cardiovascular;  Laterality: N/A;   CORONARY STENT INTERVENTION N/A 07/22/2023   Procedure: CORONARY STENT INTERVENTION;  Surgeon: Yvonne Kendall, MD;  Location: [facility] INVASIVE CV LAB;  Service: Cardiovascular;  Laterality: N/A;   CORONARY ULTRASOUND/IVUS N/A 07/22/2023   Procedure: Coronary Ultrasound/IVUS;  Surgeon: Yvonne Kendall, MD;  Location: [facility] INVASIVE CV LAB;  Service: Cardiovascular;  Laterality: N/A;   LEFT HEART CATH AND CORONARY ANGIOGRAPHY N/A 11/12/2017   Procedure: LEFT HEART CATH AND CORONARY ANGIOGRAPHY;  Surgeon: Marykay Lex, MD;  Location: [facility] INVASIVE CV LAB;  Service: Cardiovascular;  Laterality: N/A;   LEFT HEART CATH AND CORONARY ANGIOGRAPHY N/A 07/22/2023   Procedure: LEFT HEART CATH AND CORONARY ANGIOGRAPHY;  Surgeon: Yvonne Kendall, MD;  Location: [facility] INVASIVE CV LAB;  Service: Cardiovascular;  Laterality: N/A;    SOCIAL HISTORY: Social History   Socioeconomic History   Marital status: Single    Spouse name: Not on file   Number of children: Not on file   Years of education: Not on file   Highest education level: Not on file  Occupational History   Occupation: unemployed  Tobacco Use   Smoking status: Former    Current packs/day: 0.00    Average packs/day: 0.3 packs/day for 45.0 years (11.3 ttl pk-yrs)    Types: Cigarettes    Quit date: 07/22/2023    Years since quitting: 0.0   Smokeless tobacco: Never  Vaping Use   Vaping status: Never Used  Substance and Sexual Activity   Alcohol use: Yes    Alcohol/week: 75.0 standard drinks of alcohol    Types: 75 Shots of liquor per week    Comment: 2 shots of liquor daily   Drug use: Not Currently     Types: Marijuana    Comment: occasionally   Sexual activity: Yes  Other Topics Concern   Not on file  Social History Narrative   From [place]    Family lives in [place] (brother, sister and mother)    Unemployed    Drinks 3 (1/5)ths in 1 week   Social Drivers of Corporate investment banker Strain: Not on File (11/16/2021)   Received from [employer] Resource Strain: 0  Recent Concern: Programmer, applications - At Risk (11/16/2021)   Received from [employer]    Financial Resource Strain: 2  Food Insecurity: No Food Insecurity (07/19/2023)   Hunger Vital Sign    Worried About Running Out of Food in the Last Year: Never true    Ran Out of Food in the Last Year: Never true  Transportation Needs: No Transportation Needs (07/19/2023)   PRAPARE - Administrator, Civil Service (Medical): No  Lack of Transportation (Non-Medical): No  Physical Activity: Not on File (10/12/2021)   Received from [place], [place]   Physical Activity    Physical Activity: 0  Stress: Not on File (10/12/2021)   Received from [facility], [place]   Stress    Stress: 0  Social Connections: Unknown (07/19/2023)   Social Connection and Isolation Panel [NHANES]    Frequency of Communication with Friends and Family: More than three times a week    Frequency of Social Gatherings with Friends and Family: More than three times a week    Attends Religious Services: Not on file    Active Member of Clubs or Organizations: Not on file    Attends Banker Meetings: Not on file    Marital Status: Patient declined  Intimate Partner Violence: Unknown (07/19/2023)   Humiliation, Afraid, Rape, and Kick questionnaire    Fear of Current or Ex-Partner: No    Emotionally Abused: No    Physically Abused: Not on file    Sexually Abused: No    FAMILY HISTORY: Family History  Problem Relation Age of Onset   Diabetes Paternal Grandfather     Hypertension Paternal Grandfather     ALLERGIES:  has no known allergies.  MEDICATIONS:  Current Outpatient Medications  Medication Sig Dispense Refill   abiraterone acetate (ZYTIGA) 250 MG tablet Take 4 tablets (1,000 mg total) by mouth daily. Take on an empty stomach 1 hour before or 2 hours after a meal 120 tablet 0   predniSONE (DELTASONE) 5 MG tablet Take 1 tablet (5 mg total) by mouth daily with breakfast. 90 tablet 1   amLODipine (NORVASC) 10 MG tablet Take 1 tablet (10 mg total) by mouth daily. 90 tablet 3   aspirin EC 81 MG tablet Take 1 tablet (81 mg total) by mouth daily. 90 tablet 3   atorvastatin (LIPITOR) 80 MG tablet Take 1 tablet (80 mg total) by mouth daily. 90 tablet 1   calcium-vitamin D (OSCAL WITH D) 500-200 MG-UNIT tablet Take 1 tablet by mouth daily with breakfast. 90 tablet 3   carvedilol (COREG) 6.25 MG tablet Take 1 tablet (6.25 mg total) by mouth 2 (two) times daily with a meal. 180 tablet 3   nitroGLYCERIN (NITROSTAT) 0.4 MG SL tablet Place 1 tablet (0.4 mg total) under the tongue every 5 (five) minutes as needed for chest pain. 25 tablet 2   ticagrelor (BRILINTA) 90 MG TABS tablet Take 1 tablet (90 mg total) by mouth 2 (two) times daily. 180 tablet 3   No current facility-administered medications for this visit.    REVIEW OF SYSTEMS:   Constitutional: ( - ) fevers, ( - )  chills , ( - ) night sweats Eyes: ( - ) blurriness of vision, ( - ) double vision, ( - ) watery eyes Ears, nose, mouth, throat, and face: ( - ) mucositis, ( - ) sore throat Respiratory: ( - ) cough, ( - ) dyspnea, ( - ) wheezes Cardiovascular: ( - ) palpitation, ( - ) chest discomfort, ( - ) lower extremity swelling Gastrointestinal:  ( - ) nausea, ( - ) heartburn, ( - ) change in bowel habits Skin: ( - ) abnormal skin rashes Lymphatics: ( - ) new lymphadenopathy, ( - ) easy bruising Neurological: ( - ) numbness, ( - ) tingling, ( - ) new weaknesses Behavioral/Psych: ( - ) mood change, ( -  ) new changes  All other systems were reviewed with the patient and are negative.  PHYSICAL EXAMINATION: ECOG PERFORMANCE STATUS: 1 - Symptomatic but completely ambulatory  Vitals:   08/22/23 1422  BP: 114/66  Pulse: 70  Resp: 17  Temp: 97.6 F (36.4 C)  SpO2: 100%     Filed Weights   08/22/23 1422  Weight: 175 lb 12.8 oz (79.7 kg)      GENERAL:  Well appearing middle aged [place] American male. alert, no distress and comfortable SKIN: skin color, texture, turgor are normal, no rashes or significant lesions EYES: conjunctiva are pink and non-injected, sclera clear LUNGS: clear to auscultation and percussion with normal breathing effort HEART: regular rate & rhythm and no murmurs and no lower extremity edema Musculoskeletal: no cyanosis of digits and no clubbing  PSYCH: alert & oriented x 3, fluent speech NEURO: no focal motor/sensory deficits  LABORATORY DATA:  I have reviewed the data as listed    Latest Ref Rng & Units 08/22/2023    2:06 PM 07/23/2023    7:37 AM 07/22/2023    9:31 AM  CBC  WBC 4.0 - 10.5 K/uL 8.9  6.5  6.8   Hemoglobin 13.0 - 17.0 g/dL 16.1  09.6  04.5   Hematocrit 39.0 - 52.0 % 32.2  35.4  36.4   Platelets 150 - 400 K/uL 246  189  181        Latest Ref Rng & Units 08/22/2023    2:06 PM 07/23/2023    7:37 AM 07/22/2023    9:31 AM  CMP  Glucose 70 - 99 mg/dL 409  811    BUN 8 - 23 mg/dL 26  24    Creatinine 9.14 - 1.24 mg/dL 7.82  9.56  2.13   Sodium 135 - 145 mmol/L 139  136    Potassium 3.5 - 5.1 mmol/L 4.1  4.3    Chloride 98 - 111 mmol/L 106  103    CO2 22 - 32 mmol/L 26  24    Calcium 8.9 - 10.3 mg/dL 9.6  9.6    Total Protein 6.5 - 8.1 g/dL 8.1     Total Bilirubin 0.0 - 1.2 mg/dL 0.3     Alkaline Phos 38 - 126 U/L 88     AST 15 - 41 U/L 17     ALT 0 - 44 U/L 19      RADIOGRAPHIC STUDIES: No results found.  ASSESSMENT & PLAN [name] [name] is a 68 y.o. male with medical history significant for metastatic prostate cancer who  presents for a follow up visit.    # Metastatic Castrate Sensitive Prostate Cancer  --prostate biopsy performed by urology on 03/30/2020, confirms diagnosis of prostate adenocarcinoma. Gleason score not calculated due to ADT therapy  --the literature does not currently support the use of bisphosphonates in the setting of CSPC.  --CT C/A/P Scan and NM bone scan last performed in June 2023. Noted to have stable disease. Continue with imaging q 6 months.  ----Patient does not want to take abiraterone or enzalutamide 160 mg p.o. daily. Plan: --continue calcium/vitamin D for bone protection on ADT therapy.  --Due for next dose of lupron 22.5 mg today. Next shot due in Feb 2025.  --testosterone at <3 at last check, PSA increased to 4.8 on 02/19/2023 (up from 1.2 on 08/21/2022) --labs today show white blood cell 8.9, hemoglobin 10.2, MCV 86.6, platelets 246. --Repeat CT CAP and Bone scan show stable disease with no evidence of new or progressive disease when last checked in August 2024 --If progression of disease is  noted we will need to talk with the patient about restarting Zytiga or enzalutamide to better control his disease. --RTC 3 months with labs, follow up and next Lupron injection  # Depression --No longer taking citalopram 20 mg p.o. daily --He plans to re-establish care with [facility].   # Symptom management -- oxycodone 5mg  q6H PRN for pain.  --encourage OTC senna for constipation prevention if taking opioids.   # Goals of Care --discussed the incurable nature of metastatic disease --emphasized that all treatments moving forward are focused on symptom control, decreasing tumor size/slow spread, and increasing lifespan.  --patient voiced understanding of the palliative nature of treatments moving forward.  --patients MPOA is Chanel Terie Purser (his daughter)   No orders of the defined types were placed in this encounter.  All questions were answered. The patient knows to call the clinic  with any problems, questions or concerns.  I have spent a total of 30 minutes minutes of face-to-face and non-face-to-face time, preparing to see the patient, performing a medically appropriate examination, counseling and educating the patient, documenting clinical information in the electronic health record, and care coordination.   Ulysees Barns, MD Department of Hematology/Oncology [facility] at [facility] Phone: [phone] Pager: [phone] Email: [email or web address]  08/24/2023 3:25 PM   Literature Support:  Danna Hefty A Systematic Review and Meta-Analysis about the Effect of Bisphosphonates on the Risk of Skeletal-Related Event in Men with Prostate Cancer. Anticancer Agents Med Chem. 2020;20(13):1604-1612.  --Our study demonstrated that bisphosphonates could not statistically significantly reduce the risk of SRE in patients with prostate cancer, neither in the subgroups with M1 or CSPC.

## 2023-08-23 LAB — PROSTATE-SPECIFIC AG, SERUM (LABCORP): Prostate Specific Ag, Serum: 4.8 ng/mL — ABNORMAL HIGH (ref 0.0–4.0)

## 2023-08-23 LAB — TESTOSTERONE: Testosterone: <3 ng/dL — ABNORMAL LOW (ref 264–916)

## 2023-08-24 ENCOUNTER — Encounter: Payer: Self-pay | Admitting: Hematology and Oncology

## 2023-08-24 MED ORDER — ABIRATERONE ACETATE 250 MG PO TABS
1000.0000 mg | ORAL_TABLET | Freq: Every day | ORAL | 0 refills | Status: DC
  Filled 2023-08-28: qty 120, 30d supply, fill #0

## 2023-08-24 MED ORDER — PREDNISONE 5 MG PO TABS
5.0000 mg | ORAL_TABLET | Freq: Every day | ORAL | 1 refills | Status: DC
  Filled 2023-08-24: qty 90, 90d supply, fill #0

## 2023-08-26 ENCOUNTER — Other Ambulatory Visit (HOSPITAL_COMMUNITY): Payer: Self-pay

## 2023-08-26 ENCOUNTER — Telehealth: Payer: Self-pay | Admitting: Pharmacy Technician

## 2023-08-26 ENCOUNTER — Encounter: Payer: Self-pay | Admitting: Hematology and Oncology

## 2023-08-26 ENCOUNTER — Other Ambulatory Visit: Payer: Self-pay

## 2023-08-26 ENCOUNTER — Telehealth: Payer: Self-pay

## 2023-08-26 NOTE — Telephone Encounter (Signed)
 Oral Oncology Patient Advocate Encounter  Prior Authorization for abiraterone has been approved.    PA# [id number] Effective dates: 08/26/23 through 06/24/24  Patients co-pay is $130.28.    Omer Jack, CPhT-Adv Oncology Pharmacy Patient Advocate [facility]  Direct Number: [phone]  Fax: [phone]

## 2023-08-26 NOTE — Telephone Encounter (Signed)
 Oral Oncology Patient Advocate Encounter   Received notification that prior authorization for abiraterone is required.   PA submitted on 08/26/23 Key [id number] Status is pending     Omer Jack, CPhT-Adv Oncology Pharmacy Patient Advocate [facility]  Direct Number: [phone]  Fax: [phone]

## 2023-08-26 NOTE — Telephone Encounter (Signed)
 Oral Oncology Patient Advocate Encounter  Patient has active  Medicaid. Copay is $4  Omer Jack, CPhT-Adv Oncology Pharmacy Patient Advocate [facility]  Direct Number: [phone]  Fax: [phone]

## 2023-08-27 NOTE — Telephone Encounter (Signed)
 Oral Oncology Pharmacist Encounter  Received new prescription for Zytiga (abiraterone) for the treatment of metastatic castration sensitive prostate cancer in conjunction with prednisone, planned duration until disease progression or unacceptable toxicity.  Labs from 08/22/2023 (CBC, CMP) assessed, no interventions needed. Prescription dose and frequency assessed for appropriateness.  Current medication list in Epic reviewed, DDIs with Zytiga identified: - atorvastatin (cat C): zytiga may enhave the myopathic effect of statins. Will have patient monitor for any evidence of muscle toxicities while on zytiga.   - carvedilol (cat C): zytiga may increase the concentration of carvedilol. Patients blood pressure is controlled. Will have patient monitor for any excessive response to carvedilol including hypotension, bradycardia, etc.   Evaluated chart and no patient barriers to medication adherence noted. \Prescription has been e-scribed to the [employer] for benefits analysis and approval. Oral Oncology Clinic will continue to follow for insurance authorization, copayment issues, initial counseling and start date.  Bethel Born, PharmD Hematology/Oncology Clinical Pharmacist [facility] Oral Chemotherapy Navigation Clinic [phone] 08/27/2023 9:10 AM

## 2023-08-28 ENCOUNTER — Other Ambulatory Visit: Payer: Self-pay | Admitting: Pharmacy Technician

## 2023-08-28 ENCOUNTER — Other Ambulatory Visit (HOSPITAL_COMMUNITY): Payer: Self-pay

## 2023-08-28 ENCOUNTER — Telehealth: Payer: Self-pay | Admitting: *Deleted

## 2023-08-28 ENCOUNTER — Other Ambulatory Visit: Payer: Self-pay

## 2023-08-28 NOTE — Telephone Encounter (Signed)
 Oral Chemotherapy Pharmacist Encounter  I spoke with patient for overview of: Zytiga for the treatment of metastatic, castration-sensitive prostate cancer in conjunction with prednisone, planned duration until disease progression or unacceptable toxicity.   Counseled patient on administration, dosing, side effects, monitoring, drug-food interactions, safe handling, storage, and disposal.  Patient will take Zytiga 250mg  tablets, 4 tablets (1000mg ) by mouth once daily on an empty stomach, 1 hour before or 2 hours after a meal. Patient states he will take his Zytiga on an empty stomach and will wait at least 1 hour before eating. Patient will take prednisone 5mg  tablet, 1 tablet by mouth once daily with breakfast. Patient knows to start both prednisone and zytiga on the same time.  Zytiga start date: 08/30/2023  Adverse effects include but are not limited to: peripheral edema, GI upset, hypertension, hot flashes, fatigue, and arthralgias.   Reviewed with patient importance of keeping a medication schedule and plan for any missed doses. No barriers to medication adherence identified.  Medication reconciliation performed and medication/allergy list updated. Patient informed the pharmacy will reach out 5-7 days prior to needing next fill of Zytiga to coordinate continued medication acquisition to prevent break in therapy.  All questions answered. Patient voiced understanding and appreciation. Medication education handout placed in mail for patient. Patient knows to call the office with questions or concerns. Oral Chemotherapy Clinic phone number provided to patient.   Bethel Born, PharmD Hematology/Oncology Clinical Pharmacist [facility] Oral Chemotherapy Navigation Clinic [phone] 08/28/2023 2:12 PM

## 2023-08-28 NOTE — Telephone Encounter (Signed)
 [name] patient regarding lab results and oral meds for his prostate cancer. No answer but was able to leave detailed message on his identified phone vm. Advised that his PSA remains elevated. I do recommend that he restarts his Zytiga therapy 1000 mg p.o. daily. He notes he was agreeable to this at the last visit. This also needs to be taken with prednisone 5 mg p.o. daily. These are both called in to the [facility]. Please notify him to pick up these medications and begin taking them. Additionally we will need a clinic visit in 4 weeks time in order to assure compliance and efficacy.  Advised pt to call back with any questions or concerns Scheduling message sent.

## 2023-08-28 NOTE — Progress Notes (Signed)
 Patient counseled in telephone encounter opened on 08/26/2023.  Bethel Born, PharmD Hematology/Oncology Clinical Pharmacist [facility] [phone]

## 2023-08-28 NOTE — Telephone Encounter (Signed)
-----   Message from Nurse Lorra Hals sent at 08/27/2023  6:21 PM EST -----  ----- Message ----- From: Kyra Searles, RN Sent: 08/26/2023   5:39 PM EST To: Tonny Bollman, RN   ----- Message ----- From: Jaci Standard, MD Sent: 08/24/2023   3:26 PM EST To: Kyra Searles, RN  Please let Mr. Dubie know that his PSA remains elevated.  I do recommend that he restarts his Zytiga therapy 1000 mg p.o. daily.  He notes he was agreeable to this at the last visit.  This also needs to be taken with prednisone 5 mg p.o. daily.  These are both called in to the [facility].  Please notify him to pick up these medications and begin taking them.  Additionally we will need a clinic visit in 4 weeks time in order to assure compliance and efficacy.  Please request this visit to be scheduled. ----- Message ----- From: Leory Plowman, Lab In [place] Sent: 08/22/2023   2:16 PM EST To: Jaci Standard, MD

## 2023-08-28 NOTE — Progress Notes (Signed)
 Specialty Pharmacy Initial Fill Coordination Note  [name] [name] is a 68 y.o. male contacted today regarding refills of specialty medication(s) Abiraterone Acetate (ZYTIGA) .  Patient requested Delivery  on 08/29/23  to verified address [address]   [address]   [place] [address]   Medication will be filled on 08/28/23.   Patient is aware of $4 copayment.

## 2023-09-02 ENCOUNTER — Encounter: Payer: Self-pay | Admitting: Hematology and Oncology

## 2023-09-17 ENCOUNTER — Other Ambulatory Visit: Payer: Self-pay

## 2023-09-17 ENCOUNTER — Other Ambulatory Visit (HOSPITAL_COMMUNITY): Payer: Self-pay

## 2023-09-26 ENCOUNTER — Other Ambulatory Visit (HOSPITAL_COMMUNITY): Payer: Self-pay

## 2023-09-27 ENCOUNTER — Encounter: Payer: Self-pay | Admitting: Hematology and Oncology

## 2023-10-02 ENCOUNTER — Telehealth: Payer: Self-pay | Admitting: *Deleted

## 2023-10-02 ENCOUNTER — Other Ambulatory Visit: Payer: Self-pay | Admitting: Radiation Therapy

## 2023-10-02 ENCOUNTER — Other Ambulatory Visit: Payer: Self-pay

## 2023-10-02 NOTE — Telephone Encounter (Signed)
 [name] patient regarding his appt on Friday, 10/04/23. No naswer but was able to leave detailed message on his identified phone vm Advised that his appt on Friday was to see how he was doing on his Zytiga. However he has told us he is not taking it. Advised that he can come in to discuss this some more or we can cancel his appt and then we will see him at his appt in May 2025. Advised pt to call back to [phone] to let me know what he would like to do.

## 2023-10-04 ENCOUNTER — Inpatient Hospital Stay: Admitting: Physician Assistant

## 2023-10-04 ENCOUNTER — Other Ambulatory Visit: Payer: Self-pay | Admitting: Hematology and Oncology

## 2023-10-04 ENCOUNTER — Inpatient Hospital Stay: Attending: Hematology and Oncology

## 2023-10-04 VITALS — BP 134/61 | HR 69 | Temp 98.0°F | Resp 16 | Ht 74.0 in | Wt 174.0 lb

## 2023-10-04 DIAGNOSIS — D329 Benign neoplasm of meninges, unspecified: Secondary | ICD-10-CM | POA: Insufficient documentation

## 2023-10-04 DIAGNOSIS — F32A Depression, unspecified: Secondary | ICD-10-CM | POA: Diagnosis not present

## 2023-10-04 DIAGNOSIS — C7951 Secondary malignant neoplasm of bone: Secondary | ICD-10-CM | POA: Insufficient documentation

## 2023-10-04 DIAGNOSIS — Z87891 Personal history of nicotine dependence: Secondary | ICD-10-CM | POA: Insufficient documentation

## 2023-10-04 DIAGNOSIS — C61 Malignant neoplasm of prostate: Secondary | ICD-10-CM | POA: Diagnosis not present

## 2023-10-04 LAB — CMP (CANCER CENTER ONLY)
ALT: 29 U/L (ref 0–44)
AST: 22 U/L (ref 15–41)
Albumin: 4.8 g/dL (ref 3.5–5.0)
Alkaline Phosphatase: 81 U/L (ref 38–126)
Anion gap: 7 (ref 5–15)
BUN: 22 mg/dL (ref 8–23)
CO2: 26 mmol/L (ref 22–32)
Calcium: 10 mg/dL (ref 8.9–10.3)
Chloride: 109 mmol/L (ref 98–111)
Creatinine: 1.26 mg/dL — ABNORMAL HIGH (ref 0.61–1.24)
GFR, Estimated: >60 mL/min (ref >60)
Glucose, Bld: 104 mg/dL — ABNORMAL HIGH (ref 70–99)
Potassium: 4.8 mmol/L (ref 3.5–5.1)
Sodium: 142 mmol/L (ref 135–145)
Total Bilirubin: 0.3 mg/dL (ref 0.0–1.2)
Total Protein: 8.6 g/dL — ABNORMAL HIGH (ref 6.5–8.1)

## 2023-10-04 LAB — CBC WITH DIFFERENTIAL (CANCER CENTER ONLY)
Abs Immature Granulocytes: 0.03 10*3/uL (ref 0.00–0.07)
Basophils Absolute: 0.1 10*3/uL (ref 0.0–0.1)
Basophils Relative: 1 %
Eosinophils Absolute: 0.1 10*3/uL (ref 0.0–0.5)
Eosinophils Relative: 1 %
HCT: 34.4 % — ABNORMAL LOW (ref 39.0–52.0)
Hemoglobin: 10.9 g/dL — ABNORMAL LOW (ref 13.0–17.0)
Immature Granulocytes: 0 %
Lymphocytes Relative: 25 %
Lymphs Abs: 2 10*3/uL (ref 0.7–4.0)
MCH: 27.3 pg (ref 26.0–34.0)
MCHC: 31.7 g/dL (ref 30.0–36.0)
MCV: 86.2 fL (ref 80.0–100.0)
Monocytes Absolute: 0.7 10*3/uL (ref 0.1–1.0)
Monocytes Relative: 8 %
Neutro Abs: 5.3 10*3/uL (ref 1.7–7.7)
Neutrophils Relative %: 65 %
Platelet Count: 214 10*3/uL (ref 150–400)
RBC: 3.99 MIL/uL — ABNORMAL LOW (ref 4.22–5.81)
RDW: 15.1 % (ref 11.5–15.5)
WBC Count: 8.2 10*3/uL (ref 4.0–10.5)
nRBC: 0 % (ref 0.0–0.2)

## 2023-10-04 NOTE — Progress Notes (Signed)
 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 Seen by pulmonary with plans for PET and biopsy, though he was lost to follow up.  3) 01/17/2020: sent to the ED by Orthopedic surgery due to metastasis of the spine noted on MRI. IN the ED CT C/A/P showed extensive left pelvic and retroperitoneal lymphadenopathy. Mediastinal and bilateral hilar lymphadenopathy as well as mixed lytic and sclerotic lesions are noted in the bony pelvis 4) 01/17/2020: PSA noted to be 1074.94 5) 01/22/2020: establish care with Dr. Leonides Schanz. Started Bicalutamide 50mg  PO daily. Plan for 28 days of therapy.   6) 02/05/2020: Lupron 22.5mg  started.  7) 03/03/2020: received 45mg  lupron with urology. PSA 9.6 8) 03/30/2020: prostate biopsy performed, confirmed adenocarcinoma of the prostate.  9) 04/25/2020: stopped Zytiga therapy after < 7 days due to worsening depression 10) 10/04/2023: plan to restart Zytiga plus Prednisone    Interval History:  [name] [name] 68 y.o. male with medical history significant for metastatic prostate cancer who presents for a follow up visit. The patient's last visit was on 08/19/2022. In the interim since the last visit [name] [name] has continued on Lupron therapy but has not started [place] as recommended. He is unaccompanied for this visit.   On exam today [name] [name] reports his energy levels are fairly stable.  He does have fatigue which  impacts his ability to exercise regularly.  He denies this complete all his daily activities on his back.  His appetite is unchanged.  He denies nausea, vomiting or abdominal pain.  His bowel is unchanged without recurrent episodes of diarrhea or constipation.  He denies easy bruising or signs of active bleeding.  He does have chronic headaches which he contributes to meningioma.  He was seen by neurology this past week and is waiting for multidisciplinary discussion to finalize treatment recommendations.  He plans to start Zytiga plus prednisone therapy tomorrow.  He denies fevers, chills, sweats, shortness of breath, chest pain or cough.  Full 10 point ROS is listed below.    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 Leighton Roach   CORONARY STENT INTERVENTION  N/A 11/12/2017   Procedure: CORONARY STENT INTERVENTION;  Surgeon: Marykay Lex, MD;  Location[facility] INVASIVE CV LAB;  Service: Cardiovascular;  Laterality: N/A;   CORONARY STENT INTERVENTION N/A 07/22/2023   Procedure: CORONARY STENT INTERVENTION;  Surgeon: Yvonne Kendall, MD;  Location: [facility] INVASIVE CV LAB;  Service: Cardiovascular;  Laterality: N/A;   CORONARY ULTRASOUND/IVUS N/A  07/22/2023   Procedure: Coronary Ultrasound/IVUS;  Surgeon: Yvonne Kendall, MD;  Location: [facility] INVASIVE CV LAB;  Service: Cardiovascular;  Laterality: N/A;   LEFT HEART CATH AND CORONARY ANGIOGRAPHY N/A 11/12/2017   Procedure: LEFT HEART CATH AND CORONARY ANGIOGRAPHY;  Surgeon: Marykay Lex, MD;  Location: [facility] INVASIVE CV LAB;  Service: Cardiovascular;  Laterality: N/A;   LEFT HEART CATH AND CORONARY ANGIOGRAPHY N/A 07/22/2023   Procedure: LEFT HEART CATH AND CORONARY ANGIOGRAPHY;  Surgeon: Yvonne Kendall, MD;  Location: [facility] INVASIVE CV LAB;  Service: Cardiovascular;  Laterality: N/A;    SOCIAL HISTORY: Social History   Socioeconomic History   Marital status: Single    Spouse name: Not on file   Number of children: Not on file   Years of education: Not on file   Highest education level: Not on file  Occupational History   Occupation: unemployed  Tobacco Use   Smoking status: Former    Current packs/day: 0.00    Average packs/day: 0.3 packs/day for 45.0 years (11.3 ttl pk-yrs)    Types: Cigarettes    Quit date: 07/22/2023    Years since quitting: 0.2   Smokeless tobacco: Never  Vaping Use   Vaping status: Never Used  Substance and Sexual Activity   Alcohol use: Yes    Alcohol/week: 75.0 standard drinks of alcohol    Types: 75 Shots of liquor per week    Comment: 2 shots of liquor daily   Drug use: Not Currently    Types: Marijuana    Comment: occasionally   Sexual activity: Yes  Other Topics Concern   Not on file  Social History Narrative   From [place]    Family lives in [place] (brother, sister and mother)    Unemployed    Drinks 3 (1/5)ths in 1 week   Social Drivers of Corporate investment banker Strain: Not on File (11/16/2021)   Received from [employer] Resource Strain: 0  Recent Concern: Programmer, applications - At Risk (11/16/2021)   Received from [employer]    Financial Resource Strain: 2   Food Insecurity: No Food Insecurity (07/19/2023)   Hunger Vital Sign    Worried About Running Out of Food in the Last Year: Never true    Ran Out of Food in the Last Year: Never true  Transportation Needs: No Transportation Needs (07/19/2023)   PRAPARE - Administrator, Civil Service (Medical): No    Lack of Transportation (Non-Medical): No  Physical Activity: Not on File (10/12/2021)   Received from [place], [place]   Physical Activity    Physical Activity: 0  Stress: Not on File (10/12/2021)   Received from [facility], [place]   Stress    Stress: 0  Social Connections: Unknown (07/19/2023)   Social Connection and Isolation Panel [NHANES]    Frequency of Communication with Friends and Family: More than three times a week    Frequency of Social Gatherings with Friends and Family: More than three times a week    Attends Religious Services: Not on file  Active Member of Clubs or Organizations: Not on file    Attends Club or Organization Meetings: Not on file    Marital Status: Patient declined  Intimate Partner Violence: Unknown (07/19/2023)   Humiliation, Afraid, Rape, and Kick questionnaire    Fear of Current or Ex-Partner: No    Emotionally Abused: No    Physically Abused: Not on file    Sexually Abused: No    FAMILY HISTORY: Family History  Problem Relation Age of Onset   Diabetes Paternal Grandfather    Hypertension Paternal Grandfather     ALLERGIES:  has no known allergies.  MEDICATIONS:  Current Outpatient Medications  Medication Sig Dispense Refill   amLODipine (NORVASC) 10 MG tablet Take 1 tablet (10 mg total) by mouth daily. 90 tablet 3   aspirin EC 81 MG tablet Take 1 tablet (81 mg total) by mouth daily. 90 tablet 3   atorvastatin (LIPITOR) 80 MG tablet Take 1 tablet (80 mg total) by mouth daily. 90 tablet 1   calcium-vitamin D (OSCAL WITH D) 500-200 MG-UNIT tablet Take 1 tablet by mouth daily with breakfast. 90 tablet 3   carvedilol (COREG) 6.25 MG tablet Take  1 tablet (6.25 mg total) by mouth 2 (two) times daily with a meal. 180 tablet 3   nitroGLYCERIN (NITROSTAT) 0.4 MG SL tablet Place 1 tablet (0.4 mg total) under the tongue every 5 (five) minutes as needed for chest pain. 25 tablet 2   ticagrelor (BRILINTA) 90 MG TABS tablet Take 1 tablet (90 mg total) by mouth 2 (two) times daily. 180 tablet 3   abiraterone acetate (ZYTIGA) 250 MG tablet Take 4 tablets (1,000 mg total) by mouth daily. Take on an empty stomach 1 hour before or 2 hours after a meal (Patient not taking: Reported on 10/04/2023) 120 tablet 0   predniSONE (DELTASONE) 5 MG tablet Take 1 tablet (5 mg total) by mouth daily with breakfast. (Patient not taking: Reported on 10/04/2023) 90 tablet 1   No current facility-administered medications for this visit.    REVIEW OF SYSTEMS:   Constitutional: ( - ) fevers, ( - )  chills , ( - ) night sweats Eyes: ( - ) blurriness of vision, ( - ) double vision, ( - ) watery eyes Ears, nose, mouth, throat, and face: ( - ) mucositis, ( - ) sore throat Respiratory: ( - ) cough, ( - ) dyspnea, ( - ) wheezes Cardiovascular: ( - ) palpitation, ( - ) chest discomfort, ( - ) lower extremity swelling Gastrointestinal:  ( - ) nausea, ( - ) heartburn, ( - ) change in bowel habits Skin: ( - ) abnormal skin rashes Lymphatics: ( - ) new lymphadenopathy, ( - ) easy bruising Neurological: ( - ) numbness, ( - ) tingling, ( - ) new weaknesses Behavioral/Psych: ( - ) mood change, ( - ) new changes  All other systems were reviewed with the patient and are negative.  PHYSICAL EXAMINATION: ECOG PERFORMANCE STATUS: 1 - Symptomatic but completely ambulatory  Vitals:   10/04/23 1351  BP: 134/61  Pulse: 69  Resp: 16  Temp: 98 F (36.7 C)  SpO2: 100%     Filed Weights   10/04/23 1351  Weight: 174 lb (78.9 kg)      GENERAL:  Well appearing middle aged [place] American male. alert, no distress and comfortable SKIN: skin color, texture, turgor are normal, no  rashes or significant lesions EYES: conjunctiva are pink and non-injected, sclera clear LUNGS: clear to auscultation  and percussion with normal breathing effort HEART: regular rate & rhythm and no murmurs and no lower extremity edema Musculoskeletal: no cyanosis of digits and no clubbing  PSYCH: alert & oriented x 3, fluent speech NEURO: no focal motor/sensory deficits  LABORATORY DATA:  I have reviewed the data as listed    Latest Ref Rng & Units 10/04/2023    1:30 PM 08/22/2023    2:06 PM 07/23/2023    7:37 AM  CBC  WBC 4.0 - 10.5 K/uL 8.2  8.9  6.5   Hemoglobin 13.0 - 17.0 g/dL 29.5  28.4  13.2   Hematocrit 39.0 - 52.0 % 34.4  32.2  35.4   Platelets 150 - 400 K/uL 214  246  189        Latest Ref Rng & Units 08/22/2023    2:06 PM 07/23/2023    7:37 AM 07/22/2023    9:31 AM  CMP  Glucose 70 - 99 mg/dL 440  102    BUN 8 - 23 mg/dL 26  24    Creatinine 7.25 - 1.24 mg/dL 3.66  4.40  3.47   Sodium 135 - 145 mmol/L 139  136    Potassium 3.5 - 5.1 mmol/L 4.1  4.3    Chloride 98 - 111 mmol/L 106  103    CO2 22 - 32 mmol/L 26  24    Calcium 8.9 - 10.3 mg/dL 9.6  9.6    Total Protein 6.5 - 8.1 g/dL 8.1     Total Bilirubin 0.0 - 1.2 mg/dL 0.3     Alkaline Phos 38 - 126 U/L 88     AST 15 - 41 U/L 17     ALT 0 - 44 U/L 19      RADIOGRAPHIC STUDIES: No results found.  ASSESSMENT & PLAN [name] [name] is a 68 y.o. male with medical history significant for metastatic prostate cancer who presents for a follow up visit.    # Metastatic Castrate Sensitive Prostate Cancer  --prostate biopsy performed by urology on 03/30/2020, confirms diagnosis of prostate adenocarcinoma. Gleason score not calculated due to ADT therapy  --the literature does not currently support the use of bisphosphonates in the setting of CSPC.   Plan: --Last dose of lupron 22.5 mg was 08/22/2023. Next shot due in May 2025.  --Patient was prescribed Zytiga plus prednisone but has yet to start the medication.  He  reports he will start it tomorrow. --labs today show white blood cell 8.2, hemoglobin 10.9, MCV 86.2, platelets 214.  --PSA levels pending today.  Prior PSA level from 08/22/2023 was elevated at 4.8 ng/mL. --Repeat CT CAP and Bone scan show stable disease with no evidence of new or progressive disease when last checked in August 2024 --continue calcium/vitamin D for bone protection on ADT therapy.  --RTC in 4 weeks with labs, follow up and Lupron injection  #Meningoma:  --Under the care of Dr. Earnest Conroy at [facility]. Awaiting MDC discussion for finalized treatment recommendations.   # Depression --No longer taking citalopram 20 mg p.o. daily --He plans to re-establish care with [facility].   # Symptom management -- oxycodone 5mg  q6H PRN for pain.  --encourage OTC senna for constipation prevention if taking opioids.   # Goals of Care --discussed the incurable nature of metastatic disease --emphasized that all treatments moving forward are focused on symptom control, decreasing tumor size/slow spread, and increasing lifespan.  --patient voiced understanding of the palliative nature of treatments moving forward.  --patients MPOA is  Chanel Terie Purser (his daughter)   No orders of the defined types were placed in this encounter.  All questions were answered. The patient knows to call the clinic with any problems, questions or concerns.  I have spent a total of 25 minutes minutes of face-to-face and non-face-to-face time, preparing to see the patient, performing a medically appropriate examination, counseling and educating the patient, documenting clinical information in the electronic health record, and care coordination.   Georga Kaufmann PA-C Dept of Hematology and Oncology [facility] Phone: [phone]   10/04/2023 2:16 PM   Literature Support:  Danna Hefty A Systematic Review and Meta-Analysis about the Effect of  Bisphosphonates on the Risk of Skeletal-Related Event in Men with Prostate Cancer. Anticancer Agents Med Chem. 2020;20(13):1604-1612.  --Our study demonstrated that bisphosphonates could not statistically significantly reduce the risk of SRE in patients with prostate cancer, neither in the subgroups with M1 or CSPC.

## 2023-10-05 LAB — TESTOSTERONE: Testosterone: 6 ng/dL — ABNORMAL LOW (ref 264–916)

## 2023-10-05 LAB — PROSTATE-SPECIFIC AG, SERUM (LABCORP): Prostate Specific Ag, Serum: 4.4 ng/mL — ABNORMAL HIGH (ref 0.0–4.0)

## 2023-10-07 ENCOUNTER — Encounter

## 2023-10-07 ENCOUNTER — Other Ambulatory Visit (HOSPITAL_COMMUNITY): Payer: Self-pay

## 2023-10-07 ENCOUNTER — Telehealth: Payer: Self-pay | Admitting: Genetic Counselor

## 2023-10-07 NOTE — Telephone Encounter (Signed)
 Messaged Dr. Rosaline Coma and [facility] Hearing to let them know I found this patient on the brain tumor board.  He has metastatic prostate cancer and meets genetic testing criteria based on that diagnosis alone.  I offered the opportunity for our team to see this patient if he is interested in genetic testing.

## 2023-10-22 ENCOUNTER — Other Ambulatory Visit: Payer: Self-pay | Admitting: Hematology and Oncology

## 2023-10-22 ENCOUNTER — Other Ambulatory Visit (HOSPITAL_COMMUNITY): Payer: Self-pay

## 2023-10-22 MED ORDER — ABIRATERONE ACETATE 250 MG PO TABS
1000.0000 mg | ORAL_TABLET | Freq: Every day | ORAL | 0 refills | Status: DC
  Filled 2023-10-22: qty 120, 30d supply, fill #0

## 2023-10-22 NOTE — Progress Notes (Signed)
 Specialty Pharmacy Refill Coordination Note  [name] [name] is a 68 y.o. male contacted today regarding refills of specialty medication(s) Abiraterone  Acetate (ZYTIGA )   Patient requested Delivery   Delivery date: 10/30/23   Verified address: [address]   [address]   [place] [id number]   Medication will be filled on 10/29/23.  This fill date is pending response to refill request from provider. Patient is aware and if they have not received fill by intended date they must follow up with pharmacy.

## 2023-10-22 NOTE — Progress Notes (Signed)
 Specialty Pharmacy Ongoing Clinical Assessment Note  [name] [name] is a 68 y.o. male who is being followed by the specialty pharmacy service for RxSp Oncology   Patient's specialty medication(s) reviewed today: Abiraterone  Acetate (ZYTIGA )   Missed doses in the last 4 weeks: 0   Patient/Caregiver did not have any additional questions or concerns.   Therapeutic benefit summary: Patient is achieving benefit   Adverse events/side effects summary: Experienced adverse events/side effects (fatigue, tolerable at this time)   Patient's therapy is appropriate to: Continue    Goals Addressed             This Visit's Progress    Maintain optimal adherence to therapy   No change    Patient is initiating therapy. Patient will maintain adherence         Follow up:  3 months  Malachi Screws Specialty Pharmacist

## 2023-10-28 ENCOUNTER — Ambulatory Visit: Payer: Medicare Other | Admitting: Cardiovascular Disease

## 2023-10-29 ENCOUNTER — Other Ambulatory Visit: Payer: Self-pay

## 2023-11-14 ENCOUNTER — Inpatient Hospital Stay: Payer: Medicare Other | Attending: Hematology and Oncology

## 2023-11-14 ENCOUNTER — Inpatient Hospital Stay: Payer: Medicare Other

## 2023-11-14 ENCOUNTER — Other Ambulatory Visit: Payer: Self-pay | Admitting: Hematology and Oncology

## 2023-11-14 ENCOUNTER — Inpatient Hospital Stay (HOSPITAL_BASED_OUTPATIENT_CLINIC_OR_DEPARTMENT_OTHER): Payer: Medicare Other | Admitting: Hematology and Oncology

## 2023-11-14 VITALS — BP 151/83 | HR 55 | Temp 97.1°F | Resp 14 | Wt 173.5 lb

## 2023-11-14 DIAGNOSIS — C61 Malignant neoplasm of prostate: Secondary | ICD-10-CM

## 2023-11-14 DIAGNOSIS — R590 Localized enlarged lymph nodes: Secondary | ICD-10-CM

## 2023-11-14 DIAGNOSIS — C7951 Secondary malignant neoplasm of bone: Secondary | ICD-10-CM | POA: Diagnosis present

## 2023-11-14 DIAGNOSIS — Z79899 Other long term (current) drug therapy: Secondary | ICD-10-CM | POA: Diagnosis not present

## 2023-11-14 DIAGNOSIS — Z5111 Encounter for antineoplastic chemotherapy: Secondary | ICD-10-CM | POA: Insufficient documentation

## 2023-11-14 LAB — CBC WITH DIFFERENTIAL (CANCER CENTER ONLY)
Abs Immature Granulocytes: 0.02 10*3/uL (ref 0.00–0.07)
Basophils Absolute: 0.1 10*3/uL (ref 0.0–0.1)
Basophils Relative: 1 %
Eosinophils Absolute: 0.1 10*3/uL (ref 0.0–0.5)
Eosinophils Relative: 1 %
HCT: 34.1 % — ABNORMAL LOW (ref 39.0–52.0)
Hemoglobin: 11.1 g/dL — ABNORMAL LOW (ref 13.0–17.0)
Immature Granulocytes: 0 %
Lymphocytes Relative: 24 %
Lymphs Abs: 1.7 10*3/uL (ref 0.7–4.0)
MCH: 28 pg (ref 26.0–34.0)
MCHC: 32.6 g/dL (ref 30.0–36.0)
MCV: 86.1 fL (ref 80.0–100.0)
Monocytes Absolute: 0.4 10*3/uL (ref 0.1–1.0)
Monocytes Relative: 5 %
Neutro Abs: 4.8 10*3/uL (ref 1.7–7.7)
Neutrophils Relative %: 69 %
Platelet Count: 186 10*3/uL (ref 150–400)
RBC: 3.96 MIL/uL — ABNORMAL LOW (ref 4.22–5.81)
RDW: 14.8 % (ref 11.5–15.5)
WBC Count: 7 10*3/uL (ref 4.0–10.5)
nRBC: 0 % (ref 0.0–0.2)

## 2023-11-14 LAB — CMP (CANCER CENTER ONLY)
ALT: 18 U/L (ref 0–44)
AST: 18 U/L (ref 15–41)
Albumin: 4.6 g/dL (ref 3.5–5.0)
Alkaline Phosphatase: 74 U/L (ref 38–126)
Anion gap: 6 (ref 5–15)
BUN: 19 mg/dL (ref 8–23)
CO2: 26 mmol/L (ref 22–32)
Calcium: 9.8 mg/dL (ref 8.9–10.3)
Chloride: 106 mmol/L (ref 98–111)
Creatinine: 1.24 mg/dL (ref 0.61–1.24)
GFR, Estimated: >60 mL/min (ref >60)
Glucose, Bld: 106 mg/dL — ABNORMAL HIGH (ref 70–99)
Potassium: 4.6 mmol/L (ref 3.5–5.1)
Sodium: 138 mmol/L (ref 135–145)
Total Bilirubin: 0.3 mg/dL (ref 0.0–1.2)
Total Protein: 8.1 g/dL (ref 6.5–8.1)

## 2023-11-14 MED ORDER — LEUPROLIDE ACETATE (3 MONTH) 22.5 MG ~~LOC~~ KIT
22.5000 mg | PACK | Freq: Once | SUBCUTANEOUS | Status: AC
  Administered 2023-11-14: 22.5 mg via SUBCUTANEOUS
  Filled 2023-11-14: qty 22.5

## 2023-11-15 LAB — TESTOSTERONE: Testosterone: <3 ng/dL — ABNORMAL LOW (ref 264–916)

## 2023-11-15 LAB — PROSTATE-SPECIFIC AG, SERUM (LABCORP): Prostate Specific Ag, Serum: 0.1 ng/mL (ref 0.0–4.0)

## 2023-11-16 ENCOUNTER — Encounter: Payer: Self-pay | Admitting: Hematology and Oncology

## 2023-11-16 NOTE — Progress Notes (Signed)
 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 Seen by pulmonary with plans for PET and biopsy, though he was lost to follow up.  3) 01/17/2020: sent to the ED by Orthopedic surgery due to metastasis of the spine noted on MRI. IN the ED CT C/A/P showed extensive left pelvic and retroperitoneal lymphadenopathy. Mediastinal and bilateral hilar lymphadenopathy as well as mixed lytic and sclerotic lesions are noted in the bony pelvis 4) 01/17/2020: PSA noted to be 1074.94 5) 01/22/2020: establish care with Dr. Rosaline Coma. Started Bicalutamide  50mg  PO daily. Plan for 28 days of therapy.   6) 02/05/2020: Lupron  22.5mg  started.  7) 03/03/2020: received 45mg  lupron  with urology. PSA 9.6 8) 03/30/2020: prostate biopsy performed, confirmed adenocarcinoma of the prostate.  9) 04/25/2020: stopped Zytiga  therapy after < 7 days due to worsening depression 10) 04/28/2020: received enzalutamide  160mg , did not take any doses 11) 06/15/2020: agreed to restart Zytiga  1000mg  daily with prednisone  5mg   12) 09/08/2020: Lupron  22.5mg  shot.  13) 12/02/2020: Lupron  22.5mg  shot.  14) 03/01/2021: Lupron  22.5mg  shot. 15) 05/24/2021: Lupron  22.5mg  shot. 16) 08/16/2021: Lupron  22.5mg  shot. 17) 11/15/2021: Lupron  22.5 mg shot. Patient reports he is not taking zytiga  as prescribed.  18) 02/16/2022: Lupron  22.5mg  shot. 19) 05/21/2022: Lupron  22.5mg  shot. 20) 08/21/2022: Lupron  22.5mg   shot. 21) 11/20/2022: Lupron  22.5mg  shot.  Interval History:  [name] [name] 68 y.o. male with medical history significant for metastatic prostate cancer who presents for a follow up visit. The patient's last visit was on 10/04/2023. In the interim since the last visit [name] [name] has continued on Lupron  therapy but declines to continue Zytiga  or enzalutamide   On exam today [name] [name] reports he has been well overall in the [place] as her last visit.  He did undergo cataract surgery 1 week ago and is still having some blurry vision.  He is wearing thick glasses for this.  He reports that he is not having any major side effects as a result of his Zytiga  treatment at this time.  He is not having any lightheadedness, dizziness, shortness of breath.  He reports his energy is pretty good but he does experience some occasional episodes of fatigue.  He reports that he does have some issues with headache but is going to see his neurologist in a few weeks.  He does have a past history of meningioma which she has not undergone imaging since 2022.  He reports overall he has good appetite is able to do his day-to-day activities without difficulty.  He notes nothing else out of the ordinary since our last discussion.  Fortunately he has been faithful with his Zytiga .  A full 10 point ROS is otherwise negative.  MEDICAL HISTORY:  Past Medical History:  Diagnosis Date   Alcohol use    Basal ganglia infarction [facility])    Carotid artery disease (HCC)    Coronary artery disease 2011   s/p BMS stent LAD, [facility], [facility] [place], [place], Dr Laneta Pintos. 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 [name]   CORONARY STENT INTERVENTION N/A 11/12/2017   Procedure: CORONARY STENT INTERVENTION;  Surgeon: Arleen Lacer, MD;  Location: [facility] INVASIVE CV LAB;  Service: Cardiovascular;  Laterality: N/A;   CORONARY STENT INTERVENTION N/A 07/22/2023   Procedure: CORONARY STENT INTERVENTION;  Surgeon: Sammy Crisp, MD;  Location: [facility] INVASIVE CV LAB;  Service: Cardiovascular;  Laterality: N/A;   CORONARY ULTRASOUND/IVUS N/A 07/22/2023   Procedure: Coronary Ultrasound/IVUS;  Surgeon: Sammy Crisp, MD;  Location: [facility] INVASIVE CV LAB;  Service: Cardiovascular;  Laterality: N/A;   LEFT HEART CATH AND CORONARY ANGIOGRAPHY N/A 11/12/2017   Procedure: LEFT HEART CATH AND CORONARY ANGIOGRAPHY;  Surgeon: Arleen Lacer, MD;  Location: [facility] INVASIVE CV LAB;  Service: Cardiovascular;  Laterality: N/A;   LEFT HEART CATH AND CORONARY ANGIOGRAPHY N/A 07/22/2023   Procedure: LEFT HEART CATH AND CORONARY ANGIOGRAPHY;  Surgeon: Sammy Crisp, MD;  Location: [facility] INVASIVE CV LAB;  Service: Cardiovascular;  Laterality: N/A;    SOCIAL HISTORY: Social History   Socioeconomic History   Marital status: Single    Spouse name: Not on file   Number of children: Not on file   Years of education: Not on file   Highest education level: Not on file  Occupational History   Occupation: unemployed  Tobacco Use   Smoking status: Former    Current packs/day: 0.00    Average packs/day: 0.3 packs/day for 45.0 years (11.3 ttl pk-yrs)    Types: Cigarettes    Quit date: 07/22/2023    Years since quitting: 0.3   Smokeless tobacco: Never  Vaping Use   Vaping status: Never Used  Substance and Sexual Activity   Alcohol use: Yes    Alcohol/week: 75.0 standard drinks of alcohol    Types: 75 Shots of liquor per week     Comment: 2 shots of liquor daily   Drug use: Not Currently    Types: Marijuana    Comment: occasionally   Sexual activity: Yes  Other Topics Concern   Not on file  Social History Narrative   From [place]  [facility])    Family lives in [place] (brother, sister and mother)    Unemployed    Drinks 3 (1/5)ths in 1 week   Social Drivers of Corporate investment banker Strain: Not on File (11/16/2021)   Received from [employer] Resource Strain: 0  Recent Concern: Programmer, applications - At Risk (11/16/2021)   Received from [employer]    Financial Resource Strain: 2  Food Insecurity: No Food Insecurity (07/19/2023)   Hunger Vital Sign    Worried About Running Out of Food in the Last Year: Never true    Ran Out of Food in the Last Year: Never true  Transportation Needs: No Transportation Needs (07/19/2023)   PRAPARE - Transportation  Lack of Transportation (Medical): No    Lack of Transportation (Non-Medical): No  Physical Activity: Not on File (10/12/2021)   Received from [place], [place]   Physical Activity    Physical Activity: 0  Stress: Not on File (10/12/2021)   Received from [facility], [place]   Stress    Stress: 0  Social Connections: Unknown (07/19/2023)   Social Connection and Isolation Panel [NHANES]    Frequency of Communication with Friends and Family: More than three times a week    Frequency of Social Gatherings with Friends and Family: More than three times a week    Attends Religious Services: Not on file    Active Member of Clubs or Organizations: Not on file    Attends Banker Meetings: Not on file    Marital Status: Patient declined  Intimate Partner Violence: Unknown (07/19/2023)   Humiliation, Afraid, Rape, and Kick questionnaire    Fear of Current or Ex-Partner: No    Emotionally Abused: No    Physically Abused: Not on file    Sexually Abused: No    FAMILY HISTORY: Family History  Problem  Relation Age of Onset   Diabetes Paternal Grandfather    Hypertension Paternal Grandfather     ALLERGIES:  has no known allergies.  MEDICATIONS:  Current Outpatient Medications  Medication Sig Dispense Refill   abiraterone  acetate (ZYTIGA ) 250 MG tablet Take 4 tablets (1,000 mg total) by mouth daily. Take on an empty stomach 1 hour before or 2 hours after a meal 120 tablet 0   amLODipine  (NORVASC ) 10 MG tablet Take 1 tablet (10 mg total) by mouth daily. 90 tablet 3   aspirin  EC 81 MG tablet Take 1 tablet (81 mg total) by mouth daily. 90 tablet 3   atorvastatin  (LIPITOR ) 80 MG tablet Take 1 tablet (80 mg total) by mouth daily. 90 tablet 1   calcium -vitamin D  (OSCAL WITH D) 500-200 MG-UNIT tablet Take 1 tablet by mouth daily with breakfast. 90 tablet 3   carvedilol  (COREG ) 6.25 MG tablet Take 1 tablet (6.25 mg total) by mouth 2 (two) times daily with a meal. 180 tablet 3   nitroGLYCERIN  (NITROSTAT ) 0.4 MG SL tablet Place 1 tablet (0.4 mg total) under the tongue every 5 (five) minutes as needed for chest pain. 25 tablet 2   predniSONE  (DELTASONE ) 5 MG tablet Take 1 tablet (5 mg total) by mouth daily with breakfast. (Patient not taking: Reported on 10/04/2023) 90 tablet 1   ticagrelor  (BRILINTA ) 90 MG TABS tablet Take 1 tablet (90 mg total) by mouth 2 (two) times daily. 180 tablet 3   No current facility-administered medications for this visit.    REVIEW OF SYSTEMS:   Constitutional: ( - ) fevers, ( - )  chills , ( - ) night sweats Eyes: ( - ) blurriness of vision, ( - ) double vision, ( - ) watery eyes Ears, nose, mouth, throat, and face: ( - ) mucositis, ( - ) sore throat Respiratory: ( - ) cough, ( - ) dyspnea, ( - ) wheezes Cardiovascular: ( - ) palpitation, ( - ) chest discomfort, ( - ) lower extremity swelling Gastrointestinal:  ( - ) nausea, ( - ) heartburn, ( - ) change in bowel habits Skin: ( - ) abnormal skin rashes Lymphatics: ( - ) new lymphadenopathy, ( - ) easy  bruising Neurological: ( - ) numbness, ( - ) tingling, ( - ) new weaknesses Behavioral/Psych: ( - ) mood change, ( - ) new  changes  All other systems were reviewed with the patient and are negative.  PHYSICAL EXAMINATION: ECOG PERFORMANCE STATUS: 1 - Symptomatic but completely ambulatory  Vitals:   11/14/23 1439  BP: (!) 151/83  Pulse: (!) 55  Resp: 14  Temp: (!) 97.1 F (36.2 C)  SpO2: 99%     Filed Weights   11/14/23 1439  Weight: 173 lb 8 oz (78.7 kg)      GENERAL:  Well appearing middle aged [place] American male. alert, no distress and comfortable SKIN: skin color, texture, turgor are normal, no rashes or significant lesions EYES: conjunctiva are pink and non-injected, sclera clear LUNGS: clear to auscultation and percussion with normal breathing effort HEART: regular rate & rhythm and no murmurs and no lower extremity edema Musculoskeletal: no cyanosis of digits and no clubbing  PSYCH: alert & oriented x 3, fluent speech NEURO: no focal motor/sensory deficits  LABORATORY DATA:  I have reviewed the data as listed    Latest Ref Rng & Units 11/14/2023    2:21 PM 10/04/2023    1:30 PM 08/22/2023    2:06 PM  CBC  WBC 4.0 - 10.5 K/uL 7.0  8.2  8.9   Hemoglobin 13.0 - 17.0 g/dL 13.0  86.5  78.4   Hematocrit 39.0 - 52.0 % 34.1  34.4  32.2   Platelets 150 - 400 K/uL 186  214  246        Latest Ref Rng & Units 11/14/2023    2:21 PM 10/04/2023    1:30 PM 08/22/2023    2:06 PM  CMP  Glucose 70 - 99 mg/dL 696  295  284   BUN 8 - 23 mg/dL 19  22  26    Creatinine 0.61 - 1.24 mg/dL 1.32  4.40  1.02   Sodium 135 - 145 mmol/L 138  142  139   Potassium 3.5 - 5.1 mmol/L 4.6  4.8  4.1   Chloride 98 - 111 mmol/L 106  109  106   CO2 22 - 32 mmol/L 26  26  26    Calcium  8.9 - 10.3 mg/dL 9.8  72.5  9.6   Total Protein 6.5 - 8.1 g/dL 8.1  8.6  8.1   Total Bilirubin 0.0 - 1.2 mg/dL 0.3  0.3  0.3   Alkaline Phos 38 - 126 U/L 74  81  88   AST 15 - 41 U/L 18  22  17    ALT 0 - 44  U/L 18  29  19     RADIOGRAPHIC STUDIES: No results found.  ASSESSMENT & PLAN [name] [name] is a 68 y.o. male with medical history significant for metastatic prostate cancer who presents for a follow up visit.    # Metastatic Castrate Sensitive Prostate Cancer  --prostate biopsy performed by urology on 03/30/2020, confirms diagnosis of prostate adenocarcinoma. Gleason score not calculated due to ADT therapy  --the literature does not currently support the use of bisphosphonates in the setting of CSPC.  --CT C/A/P Scan and NM bone scan last performed in June 2023. Noted to have stable disease. Continue with imaging q 6 months.  ----Patient does not want to take abiraterone  or enzalutamide  160 mg p.o. daily. Plan: --continue calcium /vitamin D  for bone protection on ADT therapy.  --Due for next dose of lupron  22.5 mg today. Next shot due in August 2025.  --testosterone  at <3 at last check with undetectable PSA --labs today show white blood cell 7.0, hemoglobin 11.1, MCV 86.1, platelets 186 --Repeat CT  CAP and Bone scan show stable disease with no evidence of new or progressive disease when last checked in August 2024 --Patient notes he is faithfully taking his Zytiga  1000 mg p.o. daily with prednisone  5 mg p.o. daily --RTC 3 months with labs, follow up and next Lupron  injection  # Depression --No longer taking citalopram  20 mg p.o. daily --He plans to re-establish care with [facility].   # Symptom management -- oxycodone  5mg  q6H PRN for pain.  --encourage OTC senna for constipation prevention if taking opioids.   # Goals of Care --discussed the incurable nature of metastatic disease --emphasized that all treatments moving forward are focused on symptom control, decreasing tumor size/slow spread, and increasing lifespan.  --patient voiced understanding of the palliative nature of treatments moving forward.  --patients MPOA is [name] [name] (his daughter)   No orders of the defined types  were placed in this encounter.  All questions were answered. The patient knows to call the clinic with any problems, questions or concerns.  I have spent a total of 30 minutes minutes of face-to-face and non-face-to-face time, preparing to see the patient, performing a medically appropriate examination, counseling and educating the patient, documenting clinical information in the electronic health record, and care coordination.   Rogerio Clay, MD Department of Hematology/Oncology [facility] Phone: [phone] Pager: [phone] Email: [email or web address]  11/16/2023 7:12 PM   Literature Support:  Delinda Fendt A Systematic Review and Meta-Analysis about the Effect of Bisphosphonates on the Risk of Skeletal-Related Event in Men with Prostate Cancer. Anticancer Agents Med Chem. 2020;20(13):1604-1612.  --Our study demonstrated that bisphosphonates could not statistically significantly reduce the risk of SRE in patients with prostate cancer, neither in the subgroups with M1 or CSPC.

## 2023-11-21 ENCOUNTER — Ambulatory Visit: Payer: Self-pay | Admitting: *Deleted

## 2023-11-21 NOTE — Telephone Encounter (Signed)
-----   Message from Rogerio Clay IV sent at 11/16/2023  7:15 PM EDT ----- Please let Mr. Mclean know that his PSA is at 0.1 with undetectable testosterone .  He is having excellent response to his Zytiga .  Please encourage him to continue being compliant with his medication.  Will plan to see him back in August for his next Lupron  shot and clinic visit. ----- Message ----- From: Dannis Dy, Lab In [place] Sent: 11/14/2023   2:33 PM EDT To: Ander Bame, MD

## 2023-11-21 NOTE — Telephone Encounter (Signed)
 [name] patient regarding recent lab results. Spoke with him. Advised that his PSA is at 0.1 with undetectable testosterone . He is having excellent response to his Zytiga . Pt voiced understanding and states he is glad he resumed taking his Zytiga  He states he will continue to take it. Will plan to see him back in August for his next Lupron  shot and clinic visit. He is aware of his next appt.

## 2023-11-22 ENCOUNTER — Other Ambulatory Visit: Payer: Self-pay

## 2023-11-25 ENCOUNTER — Other Ambulatory Visit: Payer: Self-pay

## 2023-12-04 ENCOUNTER — Ambulatory Visit: Attending: Cardiovascular Disease | Admitting: Cardiovascular Disease

## 2023-12-04 ENCOUNTER — Encounter: Payer: Self-pay | Admitting: Cardiovascular Disease

## 2023-12-04 VITALS — BP 144/83 | HR 73 | Ht 74.0 in | Wt 177.0 lb

## 2023-12-04 DIAGNOSIS — I2510 Atherosclerotic heart disease of native coronary artery without angina pectoris: Secondary | ICD-10-CM | POA: Diagnosis not present

## 2023-12-04 DIAGNOSIS — E785 Hyperlipidemia, unspecified: Secondary | ICD-10-CM

## 2023-12-04 DIAGNOSIS — I10 Essential (primary) hypertension: Secondary | ICD-10-CM | POA: Diagnosis not present

## 2023-12-04 DIAGNOSIS — F17200 Nicotine dependence, unspecified, uncomplicated: Secondary | ICD-10-CM | POA: Diagnosis not present

## 2023-12-04 DIAGNOSIS — Z9861 Coronary angioplasty status: Secondary | ICD-10-CM

## 2023-12-04 NOTE — Assessment & Plan Note (Signed)
 History of essential hypertension with blood pressure measured today 144/83.  He is on amlodipine , and carvedilol .

## 2023-12-04 NOTE — Assessment & Plan Note (Signed)
 History of CAD status post remote stenting of his LAD 04/05/2010 at [facility] in [place]  with a MultiLink vision stent.  He had a non-STEMI 11/11/2017 and cardiac catheterization physician by Dr. Addie Holstein revealing patent LAD stent with tandem lesions in the circumflex and obtuse marginal branch which were stented with overlapping Synergy drug-eluting stents.  He had normal LV function.  He presented 07/22/2023 for chest pain underwent cardiac catheterization by Dr. Nolan Battle revealing patent circumflex stent with in-stent restenosis within the LAD which was restented.  He remains on DAPT with Brilinta .  He is asymptomatic.

## 2023-12-04 NOTE — Patient Instructions (Addendum)
 Medication Instructions:  Your physician recommends that you continue on your current medications as directed. Please refer to the Current Medication list given to you today.  *If you need a refill on your cardiac medications before your next appointment, please call your pharmacy*   Follow-Up: At [facility], you and your health needs are our priority.  As part of our continuing mission to provide you with exceptional heart care, our providers are all part of one team.  This team includes your primary Cardiologist (physician) and Advanced Practice Providers or APPs (Physician Assistants and Nurse Practitioners) who all work together to provide you with the care you need, when you need it.  Your next appointment:   6 month(s)  Provider:   Marlana Silvan, NP         Then, Lauro Portal, MD will plan to see you again in 12 month(s).    We recommend signing up for the patient portal called MyChart.  Sign up information is provided on this After Visit Summary.  MyChart is used to connect with patients for Virtual Visits (Telemedicine).  Patients are able to view lab/test results, encounter notes, upcoming appointments, etc.  Non-urgent messages can be sent to your provider as well.   To learn more about what you can do with MyChart, go to [email or web address].   Other Instructions Dr. Katheryne Pane would like for you to have a visit with one of our PharmDs to further discuss cholesterol therapy.

## 2023-12-04 NOTE — Assessment & Plan Note (Signed)
Discontinue tobacco abuse °

## 2023-12-04 NOTE — Progress Notes (Signed)
 12/04/2023 [name] [name]   1956-02-24  [id number]  Primary Physician Terry Ficks, NP Primary Cardiologist: Avanell Leigh MD Benn[place] Bravo, [place]  HPI:  [name] [name] is a 68 y.o.   thin appearing single African-American male father of 2 children, grandfather 3 grandchildren who currently does not work.  I last saw him in the office 08/22/2022.Aaron Aas His risk factors include 45-pack-year tobacco abuse currently smoking 4 to 5 cigarettes a day down from a pack a day 6 months ago.  History of hypertension hyperlipidemia.  He had a stent implanted in his LAD 04/05/2010 at [facility] [place]  [place] (MultiLink vision).  He was admitted to [facility] 11/11/2017 with chest pain and non-STEMI and underwent cardiac catheterization by Dr. Addie Holstein the following day revealing patent LAD stent with high-grade tandem lesions in the circumflex obtuse marginal branch which were stented with overlapping synergy drug-eluting stents.  The distal lesion in the AV groove was left untreated.  He had normal LV function.  Her last several months he is noted increasing dyspnea on exertion but denies chest pain.   A 2D echo was performed 02/01/2020 that was essentially normal.    I was going to get a Myoview stress test but this was never performed however in the interim the shortness of breath which she was complaining of has completely resolved.  He currently denies chest pain.  Unfortunately, he never started his statin drug with lipid profile performed 12/23/2019 revealed a total cholesterol of 244, LDL 175 and HDL 42..  I last saw him in the hospital when he presented with unstable angina 07/15/2023.  He underwent diagnostic coronary angiography by Dr. Nolan Battle 07/22/2023 revealing a patent circumflex stent with in-stent restenosis within the proximal LAD stent.  This was restented using a Synergy 3.5 x 28 mm long drug-eluting stent that was IVUS guided.  His LV function was preserved.  He denies chest pain  or shortness of breath.  He did stop smoking.   Current Meds  Medication Sig   abiraterone  acetate (ZYTIGA ) 250 MG tablet Take 4 tablets (1,000 mg total) by mouth daily. Take on an empty stomach 1 hour before or 2 hours after a meal   amLODipine  (NORVASC ) 10 MG tablet Take 1 tablet (10 mg total) by mouth daily.   aspirin  EC 81 MG tablet Take 1 tablet (81 mg total) by mouth daily.   atorvastatin  (LIPITOR ) 80 MG tablet Take 1 tablet (80 mg total) by mouth daily.   calcium -vitamin D  (OSCAL WITH D) 500-200 MG-UNIT tablet Take 1 tablet by mouth daily with breakfast.   carvedilol  (COREG ) 6.25 MG tablet Take 1 tablet (6.25 mg total) by mouth 2 (two) times daily with a meal.   nitroGLYCERIN  (NITROSTAT ) 0.4 MG SL tablet Place 1 tablet (0.4 mg total) under the tongue every 5 (five) minutes as needed for chest pain.   predniSONE  (DELTASONE ) 5 MG tablet Take 1 tablet (5 mg total) by mouth daily with breakfast.   ticagrelor  (BRILINTA ) 90 MG TABS tablet Take 1 tablet (90 mg total) by mouth 2 (two) times daily.     No Known Allergies  Social History   Socioeconomic History   Marital status: Single    Spouse name: Not on file   Number of children: Not on file   Years of education: Not on file   Highest education level: Not on file  Occupational History   Occupation: unemployed  Tobacco Use   Smoking status: Former  Current packs/day: 0.00    Average packs/day: 0.3 packs/day for 45.0 years (11.3 ttl pk-yrs)    Types: Cigarettes    Quit date: 07/22/2023    Years since quitting: 0.3   Smokeless tobacco: Never  Vaping Use   Vaping status: Never Used  Substance and Sexual Activity   Alcohol use: Yes    Alcohol/week: 75.0 standard drinks of alcohol    Types: 75 Shots of liquor per week    Comment: 2 shots of liquor daily   Drug use: Not Currently    Types: Marijuana    Comment: occasionally   Sexual activity: Yes  Other Topics Concern   Not on file  Social History Narrative   From [place]    Family lives in [place] (brother, sister and mother)    Unemployed    Drinks 3 (1/5)ths in 1 week   Social Drivers of Corporate investment banker Strain: Not on File (11/16/2021)   Received from [employer] Resource Strain: 0  Recent Concern: Programmer, applications - At Risk (11/16/2021)   Received from [employer]    Financial Resource Strain: 2  Food Insecurity: No Food Insecurity (07/19/2023)   Hunger Vital Sign    Worried About Running Out of Food in the Last Year: Never true    Ran Out of Food in the Last Year: Never true  Transportation Needs: No Transportation Needs (07/19/2023)   PRAPARE - Administrator, Civil Service (Medical): No    Lack of Transportation (Non-Medical): No  Physical Activity: Not on File (10/12/2021)   Received from [place], [place]   Physical Activity    Physical Activity: 0  Stress: Not on File (10/12/2021)   Received from [facility], [place]   Stress    Stress: 0  Social Connections: Unknown (07/19/2023)   Social Connection and Isolation Panel [NHANES]    Frequency of Communication with Friends and Family: More than three times a week    Frequency of Social Gatherings with Friends and Family: More than three times a week    Attends Religious Services: Not on file    Active Member of Clubs or Organizations: Not on file    Attends Banker Meetings: Not on file    Marital Status: Patient declined  Intimate Partner Violence: Unknown (07/19/2023)   Humiliation, Afraid, Rape, and Kick questionnaire    Fear of Current or Ex-Partner: No    Emotionally Abused: No    Physically Abused: Not on file    Sexually Abused: No     Review of Systems: General: negative for chills, fever, night sweats or weight changes.  Cardiovascular: negative for chest pain, dyspnea on exertion, edema, orthopnea, palpitations, paroxysmal nocturnal dyspnea or shortness of breath Dermatological:  negative for rash Respiratory: negative for cough or wheezing Urologic: negative for hematuria Abdominal: negative for nausea, vomiting, diarrhea, bright red blood per rectum, melena, or hematemesis Neurologic: negative for visual changes, syncope, or dizziness All other systems reviewed and are otherwise negative except as noted above.    Blood pressure (!) 144/83, pulse 73, height 6' 2 (1.88 m), weight 177 lb (80.3 kg), SpO2 98%.  General appearance: alert and no distress Neck: no adenopathy, no carotid bruit, no JVD, supple, symmetrical, trachea midline, and thyroid not enlarged, symmetric, no tenderness/mass/nodules Lungs: clear to auscultation bilaterally Heart: regular rate and rhythm, S1, S2 normal, no murmur, click, rub or gallop  Extremities: extremities normal, atraumatic, no cyanosis or edema Pulses: 2+ and symmetric Skin: Skin color, texture, turgor normal. No rashes or lesions Neurologic: Grossly normal  EKG not performed today      ASSESSMENT AND PLAN:   Essential hypertension History of essential hypertension with blood pressure measured today 144/83.  He is on amlodipine , and carvedilol .  Current smoker Discontinue tobacco abuse  CAD S/P percutaneous coronary angioplasty History of CAD status post remote stenting of his LAD 04/05/2010 at [facility] in [place]  with a MultiLink vision stent.  He had a non-STEMI 11/11/2017 and cardiac catheterization physician by Dr. Addie Holstein revealing patent LAD stent with tandem lesions in the circumflex and obtuse marginal branch which were stented with overlapping Synergy drug-eluting stents.  He had normal LV function.  He presented 07/22/2023 for chest pain underwent cardiac catheterization by Dr. Nolan Battle revealing patent circumflex stent with in-stent restenosis within the LAD which was restented.  He remains on DAPT with Brilinta .  He is asymptomatic.  Hyperlipidemia LDL goal <70 History of hyperlipidemia on high-dose statin  therapy lipid profile performed 07/20/2023 revealing total cholesterol of 267, LDL of 196 and HDL 32.  I am referring him to a Pharm.D. to discuss PCSK9.  LDL goal 55     Avanell Leigh MD [facility], [facility] 12/04/2023 3:15 PM

## 2023-12-04 NOTE — Assessment & Plan Note (Signed)
 History of hyperlipidemia on high-dose statin therapy lipid profile performed 07/20/2023 revealing total cholesterol of 267, LDL of 196 and HDL 32.  I am referring him to a Pharm.D. to discuss PCSK9.  LDL goal 55

## 2023-12-16 ENCOUNTER — Other Ambulatory Visit: Payer: Self-pay | Admitting: Hematology and Oncology

## 2023-12-16 ENCOUNTER — Other Ambulatory Visit: Payer: Self-pay

## 2023-12-16 MED ORDER — ABIRATERONE ACETATE 250 MG PO TABS
1000.0000 mg | ORAL_TABLET | Freq: Every day | ORAL | 0 refills | Status: DC
  Filled 2023-12-16 – 2023-12-18 (×2): qty 120, 30d supply, fill #0

## 2023-12-18 ENCOUNTER — Other Ambulatory Visit: Payer: Self-pay

## 2023-12-20 ENCOUNTER — Other Ambulatory Visit: Payer: Self-pay

## 2024-01-03 ENCOUNTER — Other Ambulatory Visit (HOSPITAL_COMMUNITY): Payer: Self-pay

## 2024-01-07 ENCOUNTER — Other Ambulatory Visit: Payer: Self-pay

## 2024-01-08 ENCOUNTER — Other Ambulatory Visit (HOSPITAL_COMMUNITY): Payer: Self-pay

## 2024-01-08 ENCOUNTER — Other Ambulatory Visit: Payer: Self-pay

## 2024-01-08 ENCOUNTER — Other Ambulatory Visit: Payer: Self-pay | Admitting: *Deleted

## 2024-01-08 MED ORDER — PREDNISONE 5 MG PO TABS
5.0000 mg | ORAL_TABLET | Freq: Every day | ORAL | 1 refills | Status: AC
  Filled 2024-01-08 (×2): qty 90, 90d supply, fill #0
  Filled 2024-05-06: qty 90, 90d supply, fill #1

## 2024-01-08 MED ORDER — ABIRATERONE ACETATE 250 MG PO TABS
1000.0000 mg | ORAL_TABLET | Freq: Every day | ORAL | 0 refills | Status: DC
  Filled 2024-01-08 (×2): qty 120, 30d supply, fill #0

## 2024-01-08 MED ORDER — ABIRATERONE ACETATE 250 MG PO TABS: 1000.0000 mg | ORAL_TABLET | Freq: Every day | ORAL | 0 refills | Status: DC

## 2024-01-08 NOTE — Progress Notes (Signed)
 Specialty Pharmacy Refill Coordination Note  [name] [name] is a 68 y.o. male contacted today regarding refills of specialty medication(s) Abiraterone  Acetate (ZYTIGA )   Patient requested Delivery   Delivery date: 01/09/24   Verified address: [address]   [address]   [name] [address]   Medication will be filled on 01/08/24.

## 2024-01-15 ENCOUNTER — Ambulatory Visit: Attending: Cardiovascular Disease | Admitting: Pharmacist

## 2024-01-15 ENCOUNTER — Telehealth: Payer: Self-pay | Admitting: Pharmacist

## 2024-01-15 ENCOUNTER — Telehealth: Payer: Self-pay | Admitting: Pharmacy Technician

## 2024-01-15 ENCOUNTER — Other Ambulatory Visit (HOSPITAL_COMMUNITY): Payer: Self-pay

## 2024-01-15 ENCOUNTER — Encounter: Payer: Self-pay | Admitting: Pharmacist

## 2024-01-15 DIAGNOSIS — E7849 Other hyperlipidemia: Secondary | ICD-10-CM | POA: Diagnosis not present

## 2024-01-15 DIAGNOSIS — E785 Hyperlipidemia, unspecified: Secondary | ICD-10-CM

## 2024-01-15 NOTE — Assessment & Plan Note (Signed)
 Assessment:  LDL goal: < 70 mg/dl last LDLc 803 mg/dl (98/7974 suspect patient was non -adherent to the statin- atorvastatin  80 gm daily)  The patient tolerates atorvastatin  well without any issues. Emphasized the importance of consistent adherence to chronic medication regimens to achieve optimal health outcomes. Discussed next potential options (Zetia, PCSK-9 inhibitors, bempedoic acid and inclisiran); cost, dosing efficacy, side effects  Follows heart healthy diet and motivated to start exercise   Plan: Continue taking current medications (Lipitor  80 mg daily) For PCSK9i PA insurance requires labs from past 120 days  Will get updated lab tomorrow and initiate PA for Repatha  Follow up lab will be due in 2-3 months of therapy change

## 2024-01-15 NOTE — Progress Notes (Signed)
 Patient ID: [name] [name]                 DOB: 1956/05/31                    MRN: [id number]      HPI: [name] [name] is a 68 y.o. male patient referred to lipid clinic by [facility]. PMH is significant for hypertension, hx of NSTEMI, CHF, CAD, HLD, hx of tobacco smoking .   He presented 07/22/2023 for chest pain underwent cardiac catheterization by Dr. Mady revealing patent circumflex stent with in-stent restenosis within the LAD which was restented. He remains on DAPT with Brilinta .on high-dose statin therapy lipid profile performed 07/20/2023 revealing total cholesterol of 267, LDL of 196 and HDL 32.  The patient presented today for follow-up in the lipid clinic. He reports no trouble tolerating his current statin therapy (atorvastatin  80 mg daily) but feels he is on too many medications and occasionally forgets to take them--for instance, missing two doses of atorvastatin  last week. He maintains a healthy diet with home-cooked meals and remains smoke-free. The patient notes that Zytiga  causes fatigue, limiting his energy for exercise, though he is motivated to begin taking 5-minute walks twice daily and gradually increase his activity. We reviewed various options for lowering LDL cholesterol, including ezetimibe, PCSK9 inhibitors, bempedoic acid, and inclisiran, discussing their mechanisms of action, dosing, side effects, and expected LDL reductions. Cost considerations and patient assistance programs were also addressed.   Current Medications: Lipitor  80 mg daily  Intolerances: none  Risk Factors: hx of NSTEMI, CHF, CAD, HLD, hx of tobacco smoking .  LDL goal: <70 mg/dl   Diet:  Low salt and low fat food. Likes vegetables over fruit  Exercise:  None   Family History: per patient  -ve for any heart problem or cholesterol problem or cancer  Social History:  Alcohol: 1 std drink per day  Smoking: quit 3 months ago  Labs:  Lipid Panel     Component Value Date/Time   CHOL 267 (H)  07/20/2023 0213   CHOL 272 (H) 04/28/2021 1053   TRIG 194 (H) 07/20/2023 0213   HDL 32 (L) 07/20/2023 0213   HDL 35 (L) 04/28/2021 1053   CHOLHDL 8.3 07/20/2023 0213   VLDL 39 07/20/2023 0213   LDLCALC 196 (H) 07/20/2023 0213   LDLCALC 164 (H) 04/28/2021 1053   LABVLDL 73 (H) 04/28/2021 1053    Past Medical History:  Diagnosis Date   Alcohol use    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 [name]   Depression    Dilation of aorta (HCC)    High cholesterol    History of herniated intervertebral disc    HTN (hypertension)    Hyperlipidemia LDL goal <70 07/21/2013   Iliac artery aneurysm (HCC)    Lung nodule    Meningioma (HCC)    NSTEMI (non-ST elevated myocardial infarction) (HCC) 11/11/2017   Prostate cancer metastatic to bone (HCC)    Smoker    1/2 ppd x >40 years     Current Outpatient Medications on File Prior to Visit  Medication Sig Dispense Refill   abiraterone  acetate (ZYTIGA ) 250 MG tablet Take 4 tablets (1,000 mg total) by mouth daily. Take on an empty stomach 1 hour before or 2 hours after a meal Email: [name] [name] 120 tablet 0  amLODipine  (NORVASC ) 10 MG tablet Take 1 tablet (10 mg total) by mouth daily. 90 tablet 3   aspirin  EC 81 MG tablet Take 1 tablet (81 mg total) by mouth daily. 90 tablet 3   atorvastatin  (LIPITOR ) 80 MG tablet Take 1 tablet (80 mg total) by mouth daily. 90 tablet 1   calcium -vitamin D  (OSCAL WITH D) 500-200 MG-UNIT tablet Take 1 tablet by mouth daily with breakfast. 90 tablet 3   carvedilol  (COREG ) 6.25 MG tablet Take 1 tablet (6.25 mg total) by mouth 2 (two) times daily with a meal. 180 tablet 3   nitroGLYCERIN  (NITROSTAT ) 0.4 MG SL tablet Place 1 tablet (0.4 mg total) under the tongue every 5 (five) minutes as needed for chest pain. 25 tablet 2   predniSONE  (DELTASONE ) 5 MG tablet Take 1 tablet (5 mg  total) by mouth daily with breakfast. 90 tablet 1   ticagrelor  (BRILINTA ) 90 MG TABS tablet Take 1 tablet (90 mg total) by mouth 2 (two) times daily. 180 tablet 3   No current facility-administered medications on file prior to visit.    No Known Allergies  Assessment/Plan:  1. Hyperlipidemia -  Problem  Hyperlipidemia Ldl Goal <70   Hyperlipidemia    Hyperlipidemia LDL goal <70 Assessment:  LDL goal: < 70 mg/dl last LDLc 803 mg/dl (98/7974 suspect patient was non -adherent to the statin- atorvastatin  80 gm daily)  The patient tolerates atorvastatin  well without any issues. Emphasized the importance of consistent adherence to chronic medication regimens to achieve optimal health outcomes. Discussed next potential options (Zetia, PCSK-9 inhibitors, bempedoic acid and inclisiran); cost, dosing efficacy, side effects  Follows heart healthy diet and motivated to start exercise   Plan: Continue taking current medications (Lipitor  80 mg daily) For PCSK9i PA insurance requires labs from past 120 days  Will get updated lab tomorrow and initiate PA for Repatha  Follow up lab will be due in 2-3 months of therapy change     Thank you,  Robbi Blanch, Pharm.D [place] [facility]. [facility] [address], [place], [place] [address] Phone: [phone]; Fax: [phone]

## 2024-01-15 NOTE — Telephone Encounter (Signed)
 FROM PATIENT CALLS  Need labs done in the last 120 days. (07/20/2023 revealing total cholesterol of 267, LDL of 196 and HDL 32 -no labs since) Sent message to [place] and archived cmm for now

## 2024-01-15 NOTE — Patient Instructions (Signed)
 Your Results:             Your most recent labs Goal  Total Cholesterol 267 < 200  Triglycerides 194 < 150  HDL (happy/good cholesterol) 32 > 40  LDL (lousy/bad cholesterol 196 < 70   Medication changes: continue taking atorvastatin  80 mg daily  We will start the process to get PCSK9i( Repatha or Praluent)  covered by your insurance.  Once the prior authorization is complete, we will call you to let you know and confirm pharmacy information.     Praluent is a cholesterol medication that improved your body's ability to get rid of bad cholesterol known as LDL. It can lower your LDL up to 60%. It is an injection that is given under the skin every 2 weeks. The most common side effects of Praluent include runny nose, symptoms of the common cold, rarely flu or flu-like symptoms, back/muscle pain in about 3-4% of the patients, and redness, pain, or bruising at the injection site.    Repatha is a cholesterol medication that improved your body's ability to get rid of bad cholesterol known as LDL. It can lower your LDL up to 60%! It is an injection that is given under the skin every 2 weeks. The medication often requires a prior authorization from your insurance company.The most common side effects of Repatha include runny nose, symptoms of the common cold, rarely flu or flu-like symptoms, back/muscle pain in about 3-4% of the patients, and redness, pain, or bruising at the injection site.   Lab orders: We want to repeat labs after 2-3 months.  We will send you a lab order to remind you once we get closer to that time.

## 2024-01-20 NOTE — Telephone Encounter (Signed)
 Labs still showing active from 01/15/24. I will keep watch for when the final result posts 8:44am 01/20/24

## 2024-01-22 NOTE — Telephone Encounter (Signed)
 Labs still showing active from 01/15/24. I will keep watch for when the final result posts 10:32am 01/22/24

## 2024-01-24 NOTE — Telephone Encounter (Signed)
 Labs still showing active from 01/15/24. I will keep watch for when the final result posts 01/24/24 8:36am

## 2024-01-28 ENCOUNTER — Other Ambulatory Visit: Payer: Self-pay

## 2024-01-28 ENCOUNTER — Other Ambulatory Visit: Payer: Self-pay | Admitting: Hematology and Oncology

## 2024-01-28 ENCOUNTER — Other Ambulatory Visit (HOSPITAL_COMMUNITY): Payer: Self-pay

## 2024-01-28 MED ORDER — ABIRATERONE ACETATE 250 MG PO TABS
1000.0000 mg | ORAL_TABLET | Freq: Every day | ORAL | 0 refills | Status: DC
  Filled 2024-01-28: qty 120, 30d supply, fill #0

## 2024-01-28 NOTE — Telephone Encounter (Signed)
 The labs are still showing active

## 2024-01-29 NOTE — Telephone Encounter (Signed)
 Call pt again to remind about the lipid lab. States he is busy so he will go next week some day.

## 2024-01-30 ENCOUNTER — Other Ambulatory Visit: Payer: Self-pay

## 2024-01-30 ENCOUNTER — Other Ambulatory Visit (HOSPITAL_COMMUNITY): Payer: Self-pay

## 2024-02-03 ENCOUNTER — Other Ambulatory Visit: Payer: Self-pay

## 2024-02-06 ENCOUNTER — Inpatient Hospital Stay: Attending: Hematology and Oncology

## 2024-02-06 ENCOUNTER — Inpatient Hospital Stay

## 2024-02-06 ENCOUNTER — Inpatient Hospital Stay (HOSPITAL_BASED_OUTPATIENT_CLINIC_OR_DEPARTMENT_OTHER): Admitting: Hematology and Oncology

## 2024-02-06 ENCOUNTER — Other Ambulatory Visit: Payer: Self-pay | Admitting: Hematology and Oncology

## 2024-02-06 VITALS — BP 135/65 | HR 72 | Temp 97.7°F | Resp 16 | Ht 74.0 in | Wt 172.5 lb

## 2024-02-06 DIAGNOSIS — C61 Malignant neoplasm of prostate: Secondary | ICD-10-CM

## 2024-02-06 DIAGNOSIS — C7951 Secondary malignant neoplasm of bone: Secondary | ICD-10-CM

## 2024-02-06 DIAGNOSIS — Z5111 Encounter for antineoplastic chemotherapy: Secondary | ICD-10-CM | POA: Insufficient documentation

## 2024-02-06 DIAGNOSIS — R590 Localized enlarged lymph nodes: Secondary | ICD-10-CM | POA: Diagnosis not present

## 2024-02-06 DIAGNOSIS — Z79899 Other long term (current) drug therapy: Secondary | ICD-10-CM | POA: Diagnosis not present

## 2024-02-06 DIAGNOSIS — R5383 Other fatigue: Secondary | ICD-10-CM | POA: Insufficient documentation

## 2024-02-06 LAB — CBC WITH DIFFERENTIAL (CANCER CENTER ONLY)
Abs Immature Granulocytes: 0.02 K/uL (ref 0.00–0.07)
Basophils Absolute: 0 K/uL (ref 0.0–0.1)
Basophils Relative: 0 %
Eosinophils Absolute: 0 K/uL (ref 0.0–0.5)
Eosinophils Relative: 0 %
HCT: 36.1 % — ABNORMAL LOW (ref 39.0–52.0)
Hemoglobin: 11.9 g/dL — ABNORMAL LOW (ref 13.0–17.0)
Immature Granulocytes: 0 %
Lymphocytes Relative: 17 %
Lymphs Abs: 1.2 K/uL (ref 0.7–4.0)
MCH: 28.1 pg (ref 26.0–34.0)
MCHC: 33 g/dL (ref 30.0–36.0)
MCV: 85.1 fL (ref 80.0–100.0)
Monocytes Absolute: 0.3 K/uL (ref 0.1–1.0)
Monocytes Relative: 4 %
Neutro Abs: 5.6 K/uL (ref 1.7–7.7)
Neutrophils Relative %: 79 %
Platelet Count: 212 K/uL (ref 150–400)
RBC: 4.24 MIL/uL (ref 4.22–5.81)
RDW: 14 % (ref 11.5–15.5)
WBC Count: 7.1 K/uL (ref 4.0–10.5)
nRBC: 0 % (ref 0.0–0.2)

## 2024-02-06 LAB — CMP (CANCER CENTER ONLY)
ALT: 19 U/L (ref 0–44)
AST: 18 U/L (ref 15–41)
Albumin: 4.7 g/dL (ref 3.5–5.0)
Alkaline Phosphatase: 74 U/L (ref 38–126)
Anion gap: 5 (ref 5–15)
BUN: 16 mg/dL (ref 8–23)
CO2: 27 mmol/L (ref 22–32)
Calcium: 9.9 mg/dL (ref 8.9–10.3)
Chloride: 106 mmol/L (ref 98–111)
Creatinine: 1.31 mg/dL — ABNORMAL HIGH (ref 0.61–1.24)
GFR, Estimated: >60 mL/min (ref >60)
Glucose, Bld: 107 mg/dL — ABNORMAL HIGH (ref 70–99)
Potassium: 4.6 mmol/L (ref 3.5–5.1)
Sodium: 138 mmol/L (ref 135–145)
Total Bilirubin: 0.4 mg/dL (ref 0.0–1.2)
Total Protein: 8 g/dL (ref 6.5–8.1)

## 2024-02-06 MED ORDER — LEUPROLIDE ACETATE (3 MONTH) 22.5 MG ~~LOC~~ KIT
22.5000 mg | PACK | Freq: Once | SUBCUTANEOUS | Status: AC
  Administered 2024-02-06: 22.5 mg via SUBCUTANEOUS
  Filled 2024-02-06: qty 22.5

## 2024-02-06 NOTE — Progress Notes (Signed)
 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 Seen by pulmonary with plans for PET and biopsy, though he was lost to follow up.  3) 01/17/2020: sent to the ED by Orthopedic surgery due to metastasis of the spine noted on MRI. IN the ED CT C/A/P showed extensive left pelvic and retroperitoneal lymphadenopathy. Mediastinal and bilateral hilar lymphadenopathy as well as mixed lytic and sclerotic lesions are noted in the bony pelvis 4) 01/17/2020: PSA noted to be 1074.94 5) 01/22/2020: establish care with Dr. Federico. Started Bicalutamide  50mg  PO daily. Plan for 28 days of therapy.   6) 02/05/2020: Lupron  22.5mg  started.  7) 03/03/2020: received 45mg  lupron  with urology. PSA 9.6 8) 03/30/2020: prostate biopsy performed, confirmed adenocarcinoma of the prostate.  9) 04/25/2020: stopped Zytiga  therapy after < 7 days due to worsening depression 10) 04/28/2020: received enzalutamide  160mg , did not take any doses 11) 06/15/2020: agreed to restart Zytiga  1000mg  daily with prednisone  5mg   12) 09/08/2020: Lupron  22.5mg  shot.  13) 12/02/2020: Lupron  22.5mg  shot.  14) 03/01/2021: Lupron  22.5mg  shot. 15) 05/24/2021: Lupron  22.5mg  shot. 16) 08/16/2021: Lupron  22.5mg  shot. 17) 11/15/2021: Lupron  22.5 mg shot. Patient reports he is not taking zytiga  as prescribed.  18) 02/16/2022: Lupron  22.5mg  shot. 19) 05/21/2022: Lupron  22.5mg  shot. 20) 08/21/2022: Lupron  22.5mg   shot. 21) 11/20/2022: Lupron  22.5mg  shot.  Interval History:  [name] [name] 68 y.o. male with medical history significant for metastatic prostate cancer who presents for a follow up visit. The patient's last visit was on 11/14/2023. In the interim since the last visit [name] [name] has continued on Lupron  therapy but declines to continue Zytiga  or enzalutamide   On exam today [name] [name] reports he recently underwent cataract surgery of his right eye and has been causing him to be somewhat unbalanced.  He notes he is faithfully taking his Zytiga  medication as prescribed 4 pills/day with prednisone .  He reports his energy levels are low, currently 3 out of 10 energy.  Reports his appetite has been okay.  He denies any lightheadedness, dizziness, shortness of breath.  He has had no issues with runny nose, sore throat, cough.  He reports no nausea, vomiting, or diarrhea.  He had no hospitalizations.  He does have his daily headaches but continues to follow in [place] for his recently diagnosed benign brain tumor.  He is willing and able to proceed with the Eligard  therapy and is not having any redness, itching, rash at the site of injection.  Full 10 point ROS is otherwise negative.  MEDICAL HISTORY:  Past Medical History:  Diagnosis Date   Alcohol use    Basal ganglia infarction [facility])    Carotid artery disease (HCC)    Coronary artery disease 2011   s/p BMS stent LAD, [facility] [place], [place], Dr GORMAN. 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 [name]   CORONARY STENT INTERVENTION N/A 11/12/2017   Procedure: CORONARY STENT INTERVENTION;  Surgeon: Anner Alm ORN, MD;  Location: [facility] INV[facility] CV LAB;  Service: Cardiovascular;  Laterality: N/A;   CORONARY STENT INTERVENTION N/A 07/22/2023   Procedure: CORONARY STENT INTERVENTION;  Surgeon: Mady Bruckner, MD;  Location: [facility] INVASIVE CV LAB;  Service: Cardiovascular;  Laterality: N/A;   CORONARY ULTRASOUND/IVUS N/A 07/22/2023   Procedure: Coronary Ultrasound/IVUS;  Surgeon: Mady Bruckner, MD;  Location: [facility] INVASIVE CV LAB;  Service: Cardiovascular;  Laterality: N/A;   LEFT HEART CATH AND CORONARY ANGIOGRAPHY N/A 11/12/2017   Procedure: LEFT HEART CATH AND CORONARY ANGIOGRAPHY;  Surgeon: Anner Alm ORN, MD;  Location: [facility] INVASIVE CV LAB;  Service: Cardiovascular;  Laterality: N/A;   LEFT HEART CATH AND CORONARY ANGIOGRAPHY N/A 07/22/2023   Procedure: LEFT HEART CATH AND CORONARY ANGIOGRAPHY;  Surgeon: Mady Bruckner, MD;  Location: [facility] INVASIVE CV LAB;  Service: Cardiovascular;  Laterality: N/A;    SOCIAL HISTORY: Social History   Socioeconomic History   Marital status: Single    Spouse name: Not on file   Number of children: Not on file   Years of education: Not on file   Highest education level: Not on file  Occupational History   Occupation: unemployed  Tobacco Use   Smoking status: Former    Current packs/day: 0.00    Average packs/day: 0.3 packs/day for 45.0 years (11.3 ttl pk-yrs)    Types: Cigarettes    Quit date: 07/22/2023    Years since quitting: 0.5   Smokeless tobacco: Never  Vaping Use   Vaping status: Never Used  Substance and Sexual Activity   Alcohol use: Yes    Alcohol/week: 75.0 standard drinks of alcohol    Types: 75 Shots of liquor per week    Comment: 2 shots of liquor daily   Drug use: Not Currently    Types: Marijuana    Comment: occasionally    Sexual activity: Yes  Other Topics Concern   Not on file  Social History Narrative   From [place]  [place]    Family lives in [place] (brother, sister and mother)    Unemployed    Drinks 3 (1/5)ths in 1 week   Social Drivers of Corporate investment banker Strain: Not on File (11/16/2021)   Received from [employer] Resource Strain: 0  Recent Concern: Programmer, applications - At Risk (11/16/2021)   Received from [employer]    Financial Resource Strain: 2  Food Insecurity: No Food Insecurity (07/19/2023)   Hunger Vital Sign    Worried About Running Out of Food in the Last Year: Never true    Ran Out of Food in the Last Year: Never true  Transportation Needs: No Transportation Needs (07/19/2023)   PRAPARE - Administrator, Civil Service (Medical): No    Lack of Transportation (Non-Medical): No  Physical Activity: Not on File (10/12/2021)   Received [facility]   Physical Activity  Physical Activity: 0  Stress: Not on File (10/12/2021)   [facility] from [facility]   Stress    Stress: 0  Social Connections: Unknown (07/19/2023)   Social Connection and Isolation Panel    Frequency of Communication with Friends and Family: More than three times a week    Frequency of Social Gatherings with Friends and Family: More than three times a week    Attends Religious Services: Not on Marketing executive or Organizations: Not on file    Attends Banker Meetings: Not on file    Marital Status: Patient declined  Intimate Partner Violence: Unknown (07/19/2023)   Humiliation, Afraid, Rape, and Kick questionnaire    Fear of Current or Ex-Partner: No    Emotionally Abused: No    Physically Abused: Not on file    Sexually Abused: No    FAMILY HISTORY: Family History  Problem Relation Age of Onset   Diabetes Paternal Grandfather    Hypertension Paternal Grandfather     ALLERGIES:  has no known  allergies.  MEDICATIONS:  Current Outpatient Medications  Medication Sig Dispense Refill   abiraterone  acetate (ZYTIGA ) 250 MG tablet Take 4 tablets (1,000 mg total) by mouth daily. Take on an empty stomach 1 hour before or 2 hours after a meal Email: [email or web address] 120 tablet 0   amLODipine  (NORVASC ) 10 MG tablet Take 1 tablet (10 mg total) by mouth daily. 90 tablet 3   aspirin  EC 81 MG tablet Take 1 tablet (81 mg total) by mouth daily. 90 tablet 3   atorvastatin  (LIPITOR ) 80 MG tablet Take 1 tablet (80 mg total) by mouth daily. 90 tablet 1   calcium -vitamin D  (OSCAL WITH D) 500-200 MG-UNIT tablet Take 1 tablet by mouth daily with breakfast. 90 tablet 3   carvedilol  (COREG ) 6.25 MG tablet Take 1 tablet (6.25 mg total) by mouth 2 (two) times daily with a meal. 180 tablet 3   nitroGLYCERIN  (NITROSTAT ) 0.4 MG SL tablet Place 1 tablet (0.4 mg total) under the tongue every 5 (five) minutes as needed for chest pain. 25 tablet 2   predniSONE  (DELTASONE ) 5 MG tablet Take 1 tablet (5 mg total) by mouth daily with breakfast. 90 tablet 1   ticagrelor  (BRILINTA ) 90 MG TABS tablet Take 1 tablet (90 mg total) by mouth 2 (two) times daily. 180 tablet 3   No current facility-administered medications for this visit.    REVIEW OF SYSTEMS:   Constitutional: ( - ) fevers, ( - )  chills , ( - ) night sweats Eyes: ( - ) blurriness of vision, ( - ) double vision, ( - ) watery eyes Ears, nose, mouth, throat, and face: ( - ) mucositis, ( - ) sore throat Respiratory: ( - ) cough, ( - ) dyspnea, ( - ) wheezes Cardiovascular: ( - ) palpitation, ( - ) chest discomfort, ( - ) lower extremity swelling Gastrointestinal:  ( - ) nausea, ( - ) heartburn, ( - ) change in bowel habits Skin: ( - ) abnormal skin rashes Lymphatics: ( - ) new lymphadenopathy, ( - ) easy bruising Neurological: ( - ) numbness, ( - ) tingling, ( - ) new weaknesses Behavioral/Psych: ( - ) mood change, ( - ) new changes  All other  systems were reviewed with the patient and are negative.  PHYSICAL EXAMINATION: ECOG PERFORMANCE STATUS: 1 - Symptomatic but completely ambulatory  Vitals:   02/06/24 1424 02/06/24 1429  BP: (!) 145/75 135/65  Pulse: 78 72  Resp: 16   Temp: 97.7 F (36.5 C)   SpO2: 98%       Filed Weights   02/06/24 1424  Weight: 172 lb 8 oz (78.2 kg)       GENERAL:  Well appearing middle aged [place] American male. alert, no distress and comfortable SKIN: skin color, texture, turgor are normal, no rashes or significant lesions EYES: conjunctiva are pink and non-injected, sclera clear LUNGS: clear to auscultation and percussion with normal breathing effort HEART: regular rate & rhythm and no murmurs and no lower extremity edema Musculoskeletal: no cyanosis of digits and no clubbing  PSYCH: alert & oriented x 3, fluent speech NEURO: no focal motor/sensory deficits  LABORATORY DATA:  I have reviewed the data as listed    Latest Ref Rng & Units 02/06/2024    2:14 PM 11/14/2023    2:21 PM 10/04/2023    1:30 PM  CBC  WBC 4.0 - 10.5 K/uL 7.1  7.0  8.2   Hemoglobin 13.0 - 17.0 g/dL 88.0  88.8  89.0   Hematocrit 39.0 - 52.0 % 36.1  34.1  34.4   Platelets 150 - 400 K/uL 212  186  214        Latest Ref Rng & Units 02/06/2024    2:14 PM 11/14/2023    2:21 PM 10/04/2023    1:30 PM  CMP  Glucose 70 - 99 mg/dL 892  893  895   BUN 8 - 23 mg/dL 16  19  22    Creatinine 0.61 - 1.24 mg/dL 8.68  8.75  8.73   Sodium 135 - 145 mmol/L 138  138  142   Potassium 3.5 - 5.1 mmol/L 4.6  4.6  4.8   Chloride 98 - 111 mmol/L 106  106  109   CO2 22 - 32 mmol/L 27  26  26    Calcium  8.9 - 10.3 mg/dL 9.9  9.8  89.9   Total Protein 6.5 - 8.1 g/dL 8.0  8.1  8.6   Total Bilirubin 0.0 - 1.2 mg/dL 0.4  0.3  0.3   Alkaline Phos 38 - 126 U/L 74  74  81   AST 15 - 41 U/L 18  18  22    ALT 0 - 44 U/L 19  18  29     RADIOGRAPHIC STUDIES: No results found.  ASSESSMENT & PLAN [name] [name] is a 68 y.o. male with  medical history significant for metastatic prostate cancer who presents for a follow up visit.    # Metastatic Castrate Sensitive Prostate Cancer  --prostate biopsy performed by urology on 03/30/2020, confirms diagnosis of prostate adenocarcinoma. Gleason score not calculated due to ADT therapy  --the literature does not currently support the use of bisphosphonates in the setting of CSPC.  --CT C/A/P Scan and NM bone scan last performed in June 2023. Noted to have stable disease. Continue with imaging q 6 months.  ----Patient does not want to take abiraterone  or enzalutamide  160 mg p.o. daily. Plan: --continue calcium /vitamin D  for bone protection on ADT therapy.  --Due for next dose of lupron  22.5 mg today. Next shot due in August 2025.  --testosterone  at <3 at last check with undetectable PSA --labs today show white blood cell 7.1, hemoglobin 1.9, MCV 85.1, platelets 212 --Repeat CT CAP and Bone scan show stable disease with no evidence of new or progressive disease when last checked in August 2024 --Patient notes he is faithfully taking his Zytiga  1000 mg p.o.  daily with prednisone  5 mg p.o. daily --RTC 3 months with labs, follow up and next Lupron  injection  # Depression --No longer taking citalopram  20 mg p.o. daily --He plans to re-establish care with [facility].   # Symptom management -- oxycodone  5mg  q6H PRN for pain.  --encourage OTC senna for constipation prevention if taking opioids.   # Goals of Care --discussed the incurable nature of metastatic disease --emphasized that all treatments moving forward are focused on symptom control, decreasing tumor size/slow spread, and increasing lifespan.  --patient voiced understanding of the palliative nature of treatments moving forward.  --patients MPOA is [name] [name] (his daughter)   No orders of the defined types were placed in this encounter.  All questions were answered. The patient knows to call the clinic with any problems,  questions or concerns.  I have spent a total of 30 minutes minutes of face-to-face and non-face-to-face time, preparing to see the patient, performing a medically appropriate examination, counseling and educating the patient, documenting clinical information in the electronic health record, and care coordination.   [name] IVAR Kidney, MD Department of Hematology/Oncology [facility] Phone: [phone] Pager: [phone] Email: [name]  02/09/2024 5:33 PM   Literature Support:  Alena JAYSON Cena VEAR Laurence DOROTHA A Systematic Review and Meta-Analysis about the Effect of Bisphosphonates on the Risk of Skeletal-Related Event in Men with Prostate Cancer. Anticancer Agents Med Chem. 2020;20(13):1604-1612.  --Our study demonstrated that bisphosphonates could not statistically significantly reduce the risk of SRE in patients with prostate cancer, neither in the subgroups with M1 or CSPC.

## 2024-02-07 LAB — TESTOSTERONE: Testosterone: <3 ng/dL — ABNORMAL LOW (ref 264–916)

## 2024-02-07 LAB — PROSTATE-SPECIFIC AG, SERUM (LABCORP): Prostate Specific Ag, Serum: 0.3 ng/mL (ref 0.0–4.0)

## 2024-02-09 ENCOUNTER — Encounter: Payer: Self-pay | Admitting: Hematology and Oncology

## 2024-02-11 ENCOUNTER — Ambulatory Visit: Payer: Self-pay | Admitting: *Deleted

## 2024-02-11 NOTE — Telephone Encounter (Addendum)
 [name] patient with message from Dr. Federico as noted below. Left message on named voice mail. Requested patient to contact office if he is not taking Zytiga  consistently or if he has experienced any difficulty getting it refilled.  ----- Message from Norleen ONEIDA Federico IV sent at 02/09/2024  5:33 PM EDT ----- Please let Mr. Ridley know that his PSA increased slightly to 0.3.  Please assure that he is taking his Zytiga  faithfully as prescribed on a daily basis.  We will plan to see him back as prescribed  in 3 months time. ----- Message ----- From: Rebecka, Lab [facility] [place] Sent: 02/06/2024   2:26 PM EDT To: Norleen ONEIDA Federico MADISON, MD

## 2024-02-19 ENCOUNTER — Other Ambulatory Visit: Payer: Self-pay

## 2024-04-28 ENCOUNTER — Inpatient Hospital Stay: Attending: Hematology and Oncology

## 2024-04-28 DIAGNOSIS — E291 Testicular hypofunction: Secondary | ICD-10-CM | POA: Insufficient documentation

## 2024-04-28 DIAGNOSIS — Z87891 Personal history of nicotine dependence: Secondary | ICD-10-CM | POA: Insufficient documentation

## 2024-04-28 DIAGNOSIS — C7951 Secondary malignant neoplasm of bone: Secondary | ICD-10-CM | POA: Insufficient documentation

## 2024-04-28 DIAGNOSIS — Z79899 Other long term (current) drug therapy: Secondary | ICD-10-CM | POA: Insufficient documentation

## 2024-04-28 DIAGNOSIS — C61 Malignant neoplasm of prostate: Secondary | ICD-10-CM | POA: Insufficient documentation

## 2024-04-28 DIAGNOSIS — F32A Depression, unspecified: Secondary | ICD-10-CM | POA: Insufficient documentation

## 2024-04-28 DIAGNOSIS — Z5111 Encounter for antineoplastic chemotherapy: Secondary | ICD-10-CM | POA: Insufficient documentation

## 2024-04-28 NOTE — Progress Notes (Signed)
 [facility] Clinical Social Work  Clinical Social Work was referred by caregiver for need for community resources.  Clinical Social Worker daughter to offer support and assess for needs.  Patient's daughter expressed desire to eventually move patient closer to [place] to ensure he has adequate familial support. Daughter reported she saw patient on Saturday, but this week he seems to be better.  Daughter inquired about in home assistance, assisted living, and [place] documents.  Interventions: CSW provided education on PCS services available through [employer]. Application pending Oncologist signature. Patient's daughter understands Patient's PCP doctor will be a great resource for handling PCS applications in future.  CSW discussed low income housing, long term medicaid, and assisted living. Patient's daughter was appreciative of the information.   CSW discussed HCPOA clinics available through [facility]. CSW sent information via email for daughter to review.     Follow Up Plan:  Patient will contact CSW with any support or resource needs and CSW will follow-up with patient by phone     Lizbeth Sprague, LCSW  Clinical Social Worker [facility]

## 2024-04-30 ENCOUNTER — Inpatient Hospital Stay: Admitting: Hematology and Oncology

## 2024-04-30 ENCOUNTER — Other Ambulatory Visit: Payer: Self-pay | Admitting: Hematology and Oncology

## 2024-04-30 ENCOUNTER — Inpatient Hospital Stay

## 2024-04-30 VITALS — BP 168/94 | HR 73 | Temp 97.9°F | Resp 14 | Wt 168.8 lb

## 2024-04-30 DIAGNOSIS — Z87891 Personal history of nicotine dependence: Secondary | ICD-10-CM | POA: Diagnosis not present

## 2024-04-30 DIAGNOSIS — F32A Depression, unspecified: Secondary | ICD-10-CM | POA: Diagnosis not present

## 2024-04-30 DIAGNOSIS — Z79899 Other long term (current) drug therapy: Secondary | ICD-10-CM | POA: Diagnosis not present

## 2024-04-30 DIAGNOSIS — C61 Malignant neoplasm of prostate: Secondary | ICD-10-CM

## 2024-04-30 DIAGNOSIS — C7951 Secondary malignant neoplasm of bone: Secondary | ICD-10-CM

## 2024-04-30 DIAGNOSIS — R590 Localized enlarged lymph nodes: Secondary | ICD-10-CM | POA: Diagnosis not present

## 2024-04-30 DIAGNOSIS — E291 Testicular hypofunction: Secondary | ICD-10-CM | POA: Diagnosis not present

## 2024-04-30 DIAGNOSIS — Z5111 Encounter for antineoplastic chemotherapy: Secondary | ICD-10-CM | POA: Diagnosis present

## 2024-04-30 LAB — CBC WITH DIFFERENTIAL (CANCER CENTER ONLY)
Abs Immature Granulocytes: 0.02 K/uL (ref 0.00–0.07)
Basophils Absolute: 0.1 K/uL (ref 0.0–0.1)
Basophils Relative: 1 %
Eosinophils Absolute: 0 K/uL (ref 0.0–0.5)
Eosinophils Relative: 0 %
HCT: 36.5 % — ABNORMAL LOW (ref 39.0–52.0)
Hemoglobin: 12 g/dL — ABNORMAL LOW (ref 13.0–17.0)
Immature Granulocytes: 0 %
Lymphocytes Relative: 18 %
Lymphs Abs: 1.2 K/uL (ref 0.7–4.0)
MCH: 28.2 pg (ref 26.0–34.0)
MCHC: 32.9 g/dL (ref 30.0–36.0)
MCV: 85.7 fL (ref 80.0–100.0)
Monocytes Absolute: 0.3 K/uL (ref 0.1–1.0)
Monocytes Relative: 4 %
Neutro Abs: 5.3 K/uL (ref 1.7–7.7)
Neutrophils Relative %: 77 %
Platelet Count: 201 K/uL (ref 150–400)
RBC: 4.26 MIL/uL (ref 4.22–5.81)
RDW: 14.4 % (ref 11.5–15.5)
WBC Count: 6.9 K/uL (ref 4.0–10.5)
nRBC: 0 % (ref 0.0–0.2)

## 2024-04-30 LAB — CMP (CANCER CENTER ONLY)
ALT: 15 U/L (ref 0–44)
AST: 17 U/L (ref 15–41)
Albumin: 4.4 g/dL (ref 3.5–5.0)
Alkaline Phosphatase: 59 U/L (ref 38–126)
Anion gap: 6 (ref 5–15)
BUN: 19 mg/dL (ref 8–23)
CO2: 25 mmol/L (ref 22–32)
Calcium: 9.7 mg/dL (ref 8.9–10.3)
Chloride: 109 mmol/L (ref 98–111)
Creatinine: 1.39 mg/dL — ABNORMAL HIGH (ref 0.61–1.24)
GFR, Estimated: 56 mL/min — ABNORMAL LOW (ref >60)
Glucose, Bld: 108 mg/dL — ABNORMAL HIGH (ref 70–99)
Potassium: 4.3 mmol/L (ref 3.5–5.1)
Sodium: 140 mmol/L (ref 135–145)
Total Bilirubin: 0.3 mg/dL (ref 0.0–1.2)
Total Protein: 8 g/dL (ref 6.5–8.1)

## 2024-04-30 LAB — PSA: Prostatic Specific Antigen: 0.41 ng/mL (ref 0.00–4.00)

## 2024-04-30 NOTE — Progress Notes (Signed)
 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 Seen by pulmonary with plans for PET and biopsy, though he was lost to follow up.  3) 01/17/2020: sent to the ED by Orthopedic surgery due to metastasis of the spine noted on MRI. IN the ED CT C/A/P showed extensive left pelvic and retroperitoneal lymphadenopathy. Mediastinal and bilateral hilar lymphadenopathy as well as mixed lytic and sclerotic lesions are noted in the bony pelvis 4) 01/17/2020: PSA noted to be 1074.94 5) 01/22/2020: establish care with Dr. Federico. Started Bicalutamide  50mg  PO daily. Plan for 28 days of therapy.   6) 02/05/2020: Lupron  22.5mg  started.  7) 03/03/2020: received 45mg  lupron  with urology. PSA 9.6 8) 03/30/2020: prostate biopsy performed, confirmed adenocarcinoma of the prostate.  9) 04/25/2020: stopped Zytiga  therapy after < 7 days due to worsening depression 10) 04/28/2020: received enzalutamide  160mg , did not take any doses 11) 06/15/2020: agreed to restart Zytiga  1000mg  daily with prednisone  5mg   12) 09/08/2020: Lupron  22.5mg  shot.  13) 12/02/2020: Lupron  22.5mg  shot.  14) 03/01/2021: Lupron  22.5mg  shot. 15) 05/24/2021: Lupron  22.5mg  shot. 16) 08/16/2021: Lupron  22.5mg  shot. 17) 11/15/2021: Lupron  22.5 mg shot. Patient reports he is not taking zytiga  as prescribed.  18) 02/16/2022: Lupron  22.5mg  shot. 19) 05/21/2022: Lupron  22.5mg  shot. 20) 08/21/2022: Lupron  22.5mg   shot. 21) 11/20/2022: Lupron  22.5mg  shot.  Interval History:  [name] [name] 68 y.o. male with medical history significant for metastatic prostate cancer who presents for a follow up visit. The patient's last visit was on 02/06/2024. In the interim since the last visit [name] [name] has continued on Lupron  therapy and reports he has been faithful with the zytiga .   On exam today [name] [name] reports he unfortunately had a fall from standing about a week ago while at the [employer].  He reports he landed on his arms and did not hit his head.  He did not need to go to the emergency room or need to emergency transport at the time.  He reports that he has been taking his Zytiga  pills but not faithfully.  He notes he only takes them once or twice per week.  He reports that he is not having any issues with bone or back pain.  He has been tolerating his Lupron  injections well with no major side effects.  He is not having any hot flashes, fevers, chills, sweats.  He notes that he would like to defer his shot this week to next week as he is feeling under the weather today.  He reports he has a lot of fatigue and his motivation levels are low both physically and mentally.  I feel like a zombie.  Otherwise he has had no infectious symptoms such as runny nose, sore throat, cough.  Full 10 point ROS otherwise negative.  MEDICAL HISTORY:  Past Medical History:  Diagnosis Date   Alcohol use    Basal ganglia infarction [facility])    Carotid artery disease    Coronary artery disease 2011   s/p BMS stent LAD, [facility],  [facility] [place], [place], Dr GORMAN. [name]   Depression    Dilation of aorta    High cholesterol    History of herniated intervertebral disc    HTN (hypertension)    Hyperlipidemia LDL goal <70 07/21/2013   Iliac artery aneurysm    Lung nodule    Meningioma (HCC)    NSTEMI (non-ST elevated myocardial infarction) (HCC) 11/11/2017   Prostate cancer metastatic to bone (HCC)    Smoker    1/2 ppd x  >40 years     SURGICAL HISTORY: Past Surgical History:  Procedure Laterality Date   CORONARY ANGIOPLASTY WITH STENT PLACEMENT Left 2011   BMS LAD, [facility], [facility] [place], [place], Dr GORMAN. [name]   CORONARY STENT INTERVENTION N/A 11/12/2017   Procedure: CORONARY STENT INTERVENTION;  Surgeon: Anner Alm ORN, MD;  Location: [facility] INVASIVE CV LAB;  Service: Cardiovascular;  Laterality: N/A;   CORONARY STENT INTERVENTION N/A 07/22/2023   Procedure: CORONARY STENT INTERVENTION;  Surgeon: Mady Bruckner, MD;  Location: [facility] INVASIVE CV LAB;  Service: Cardiovascular;  Laterality: N/A;   CORONARY ULTRASOUND/IVUS N/A 07/22/2023   Procedure: Coronary Ultrasound/IVUS;  Surgeon: Mady Bruckner, MD;  Location: [facility] INVASIVE CV LAB;  Service: Cardiovascular;  Laterality: N/A;   LEFT HEART CATH AND CORONARY ANGIOGRAPHY N/A 11/12/2017   Procedure: LEFT HEART CATH AND CORONARY ANGIOGRAPHY;  Surgeon: Anner Alm ORN, MD;  Location: [facility] INVASIVE CV LAB;  Service: Cardiovascular;  Laterality: N/A;   LEFT HEART CATH AND CORONARY ANGIOGRAPHY N/A 07/22/2023   Procedure: LEFT HEART CATH AND CORONARY ANGIOGRAPHY;  Surgeon: Mady Bruckner, MD;  Location: [facility] INVASIVE CV LAB;  Service: Cardiovascular;  Laterality: N/A;    SOCIAL HISTORY: Social History   Socioeconomic History   Marital status: Single    Spouse name: Not on file   Number of children: Not on file   Years of education: Not on file   Highest education level: Not on file  Occupational History   Occupation: unemployed  Tobacco Use   Smoking status: Former    Current packs/day: 0.00    Average packs/day: 0.3 packs/day for 45.0 years (11.3 ttl pk-yrs)    Types: Cigarettes    Quit date: 07/22/2023    Years since quitting: 0.7   Smokeless tobacco: Never  Vaping Use   Vaping status: Never Used  Substance and Sexual Activity   Alcohol use: Yes    Alcohol/week: 75.0 standard drinks of alcohol    Types: 75 Shots of liquor per week    Comment: 2  shots of liquor daily   Drug use: Not Currently    Types: Marijuana    Comment: occasionally   Sexual activity: Yes  Other Topics Concern   Not on file  Social History Narrative   From [place]  [facility])    Family lives in [place] (brother, sister and mother)    Unemployed    Drinks 3 (1/5)ths in 1 week   Social Drivers of Corporate Investment Banker Strain: Not on File (11/16/2021)   Received from [employer] Resource Strain: 0  Recent Concern: Programmer, Applications - At Risk (11/16/2021)   Received from [employer]    Financial Resource Strain: 2  Food Insecurity: No Food Insecurity (07/19/2023)   Hunger Vital Sign    Worried About Running Out of Food in the Last Year: Never true    Ran Out of Food in the Last Year: Never true  Transportation Needs: No Transportation Needs (07/19/2023)   PRAPARE - Administrator, Civil Service (Medical): No    Lack of Transportation (Non-Medical): No  Physical Activity: Not on File (10/12/2021)   Received from [facility]   Physical Activity    Physical Activity: 0  Stress: Not on File (10/12/2021)   Received from [facility]   Stress    Stress: 0  Social Connections: Unknown (07/19/2023)   Social Connection and Isolation Panel    Frequency of Communication with Friends and Family: More than three times a week    Frequency of Social Gatherings with Friends and Family: More than three times a week    Attends Religious Services: Not on file    Active Member of Clubs or Organizations: Not on file    Attends Banker Meetings: Not on file    Marital Status: Patient declined  Intimate Partner Violence: Unknown (07/19/2023)   Humiliation, Afraid, Rape, and Kick questionnaire    Fear of Current or Ex-Partner: No    Emotionally Abused: No    Physically Abused: Not on file    Sexually Abused: No    FAMILY HISTORY: Family History  Problem Relation Age of Onset   Diabetes  Paternal Grandfather    Hypertension Paternal Grandfather     ALLERGIES:  has no known allergies.  MEDICATIONS:  Current Outpatient Medications  Medication Sig Dispense Refill   abiraterone  acetate (ZYTIGA ) 250 MG tablet Take 4 tablets (1,000 mg total) by mouth daily. Take on an empty stomach 1 hour before or 2 hours after a meal Email: [email or web address] 120 tablet 0   amLODipine  (NORVASC ) 10 MG tablet Take 1 tablet (10 mg total) by mouth daily. 90 tablet 3   aspirin  EC 81 MG tablet Take 1 tablet (81 mg total) by mouth daily. 90 tablet 3   atorvastatin  (LIPITOR ) 80 MG tablet Take 1 tablet (80 mg total) by mouth daily. 90 tablet 1   calcium -vitamin D  (OSCAL WITH D) 500-200 MG-UNIT tablet Take 1 tablet by mouth daily with breakfast. 90 tablet 3   carvedilol  (COREG ) 6.25 MG tablet Take 1 tablet (6.25 mg total) by mouth 2 (two) times daily with a meal. 180 tablet 3   nitroGLYCERIN  (NITROSTAT ) 0.4 MG SL tablet Place 1 tablet (0.4 mg total) under the tongue every 5 (five) minutes as needed for chest pain. 25 tablet 2   predniSONE  (DELTASONE ) 5 MG tablet Take 1 tablet (5 mg total) by mouth daily with breakfast. 90 tablet 1   ticagrelor  (BRILINTA ) 90 MG TABS tablet Take 1 tablet (90 mg total) by mouth 2 (two) times daily. 180 tablet 3   No current facility-administered medications for this visit.    REVIEW OF SYSTEMS:   Constitutional: ( - ) fevers, ( - )  chills , ( - ) night sweats Eyes: ( - ) blurriness of vision, ( - ) double vision, ( - ) watery eyes Ears, nose, mouth, throat, and face: ( - ) mucositis, ( - ) sore throat Respiratory: ( - ) cough, ( - ) dyspnea, ( - ) wheezes Cardiovascular: ( - ) palpitation, ( - ) chest discomfort, ( - ) lower extremity swelling Gastrointestinal:  ( - ) nausea, ( - ) heartburn, ( - ) change in bowel habits Skin: ( - ) abnormal skin rashes Lymphatics: ( - ) new lymphadenopathy, ( - ) easy bruising Neurological: ( - ) numbness, ( - ) tingling,  ( - ) new weaknesses Behavioral/Psych: ( - )  mood change, ( - ) new changes  All other systems were reviewed with the patient and are negative.  PHYSICAL EXAMINATION: ECOG PERFORMANCE STATUS: 1 - Symptomatic but completely ambulatory  Vitals:   04/30/24 1420 04/30/24 1449  BP: (!) 167/92 (!) 168/94  Pulse:    Resp:    Temp:    SpO2:         Filed Weights   04/30/24 1419  Weight: 168 lb 12.8 oz (76.6 kg)        GENERAL:  Well appearing middle aged African American male. alert, no distress and comfortable SKIN: skin color, texture, turgor are normal, no rashes or significant lesions EYES: conjunctiva are pink and non-injected, sclera clear LUNGS: clear to auscultation and percussion with normal breathing effort HEART: regular rate & rhythm and no murmurs and no lower extremity edema Musculoskeletal: no cyanosis of digits and no clubbing  PSYCH: alert & oriented x 3, fluent speech NEURO: no focal motor/sensory deficits  LABORATORY DATA:  I have reviewed the data as listed    Latest Ref Rng & Units 04/30/2024    1:56 PM 02/06/2024    2:14 PM 11/14/2023    2:21 PM  CBC  WBC 4.0 - 10.5 K/uL 6.9  7.1  7.0   Hemoglobin 13.0 - 17.0 g/dL 87.9  88.0  88.8   Hematocrit 39.0 - 52.0 % 36.5  36.1  34.1   Platelets 150 - 400 K/uL 201  212  186        Latest Ref Rng & Units 04/30/2024    1:56 PM 02/06/2024    2:14 PM 11/14/2023    2:21 PM  CMP  Glucose 70 - 99 mg/dL 891  892  893   BUN 8 - 23 mg/dL 19  16  19    Creatinine 0.61 - 1.24 mg/dL 8.60  8.68  8.75   Sodium 135 - 145 mmol/L 140  138  138   Potassium 3.5 - 5.1 mmol/L 4.3  4.6  4.6   Chloride 98 - 111 mmol/L 109  106  106   CO2 22 - 32 mmol/L 25  27  26    Calcium  8.9 - 10.3 mg/dL 9.7  9.9  9.8   Total Protein 6.5 - 8.1 g/dL 8.0  8.0  8.1   Total Bilirubin 0.0 - 1.2 mg/dL 0.3  0.4  0.3   Alkaline Phos 38 - 126 U/L 59  74  74   AST 15 - 41 U/L 17  18  18    ALT 0 - 44 U/L 15  19  18     RADIOGRAPHIC STUDIES: No  results found.  ASSESSMENT & PLAN [name] [name] is a 68 y.o. male with medical history significant for metastatic prostate cancer who presents for a follow up visit.    # Metastatic Castrate Sensitive Prostate Cancer  --prostate biopsy performed by urology on 03/30/2020, confirms diagnosis of prostate adenocarcinoma. Gleason score not calculated due to ADT therapy  --the literature does not currently support the use of bisphosphonates in the setting of CSPC.  --CT C/A/P Scan and NM bone scan last performed in June 2023. Noted to have stable disease. Continue with imaging q 6 months.  ----Patient does not want to take abiraterone  or enzalutamide  160 mg p.o. daily. Plan: --continue calcium /vitamin D  for bone protection on ADT therapy.  --Due for next dose of lupron  22.5 mg today. Next shot due in August 2025.  --testosterone  at <3 at last check with undetectable PSA --labs today  show white blood cell 6.9, Hgb 12.0, MCV 85.7, Plt 201.  --Repeat CT CAP and Bone scan show stable disease with no evidence of new or progressive disease when last checked in August 2024 --Patient notes he is NOT faithfully taking his Zytiga  1000 mg p.o. daily with prednisone  5 mg p.o. daily.  He is only taking it once or twice per week. --RTC 3 months with labs, follow up and next Lupron  injection  # Depression --No longer taking citalopram  20 mg p.o. daily --He plans to re-establish care with [facility].   # Symptom management -- oxycodone  5mg  q6H PRN for pain.  --encourage OTC senna for constipation prevention if taking opioids.   # Goals of Care --discussed the incurable nature of metastatic disease --emphasized that all treatments moving forward are focused on symptom control, decreasing tumor size/slow spread, and increasing lifespan.  --patient voiced understanding of the palliative nature of treatments moving forward.  --patients MPOA is Chanel Elam (his daughter)   No orders of the defined types were  placed in this encounter.  All questions were answered. The patient knows to call the clinic with any problems, questions or concerns.  I have spent a total of 30 minutes minutes of face-to-face and non-face-to-face time, preparing to see the patient, performing a medically appropriate examination, counseling and educating the patient, documenting clinical information in the electronic health record, and care coordination.   Norleen IVAR Kidney, MD Department of Hematology/Oncology [facility] Phone: [phone] Pager: [phone] Email: [email or web address]  04/30/2024 4:45 PM   Literature Support:  Alena JAYSON Cena VEAR Laurence DOROTHA A Systematic Review and Meta-Analysis about the Effect of Bisphosphonates on the Risk of Skeletal-Related Event in Men with Prostate Cancer. Anticancer Agents Med Chem. 2020;20(13):1604-1612.  --Our study demonstrated that bisphosphonates could not statistically significantly reduce the risk of SRE in patients with prostate cancer, neither in the subgroups with M1 or CSPC.

## 2024-05-01 LAB — TESTOSTERONE: Testosterone: <3 ng/dL — ABNORMAL LOW (ref 264–916)

## 2024-05-04 NOTE — Progress Notes (Signed)
 [facility] CSW Progress Note  Clinical Social Worker messaged Chanel via email with telephone number for [facility] to receive PCS Form. CSW encouraged patient to message CSW once it is received.  Lizbeth Sprague, LCSW Clinical Social Worker [facility]

## 2024-05-05 ENCOUNTER — Inpatient Hospital Stay

## 2024-05-05 NOTE — Progress Notes (Signed)
 [facility] CSW Progress Note  Clinical Social Worker is continuing to assist family with adding in home support. Patient's niece contacted insurance company to receive Personal Care Service Assessment form for medical provider to complete. However, [place] provided instructions on how to receive in-home skilled nursing support (PT/OT). CSW attempted to reach Insurance company again to receive form, however, this was unsuccessful. CSW will attempt to reach company again.    Lizbeth Sprague, LCSW Clinical Social Worker [facility]

## 2024-05-06 ENCOUNTER — Other Ambulatory Visit: Payer: Self-pay

## 2024-05-06 ENCOUNTER — Ambulatory Visit: Payer: Self-pay | Admitting: *Deleted

## 2024-05-06 ENCOUNTER — Encounter: Payer: Self-pay | Admitting: *Deleted

## 2024-05-06 MED ORDER — ABIRATERONE ACETATE 250 MG PO TABS
1000.0000 mg | ORAL_TABLET | Freq: Every day | ORAL | 0 refills | Status: DC
  Filled 2024-05-06 (×2): qty 120, 30d supply, fill #0

## 2024-05-06 NOTE — Progress Notes (Signed)
 Specialty Pharmacy Ongoing Clinical Assessment Note  Adryel Wortmann is a 68 y.o. male who is being followed by the specialty pharmacy service for RxSp Oncology   Patient's specialty medication(s) reviewed today: Abiraterone  Acetate (ZYTIGA )   Missed doses in the last 4 weeks: All   Patient/Caregiver did not have any additional questions or concerns.   Therapeutic benefit summary: Patient is achieving benefit   Adverse events/side effects summary: No adverse events/side effects   Patient's therapy is appropriate to: Continue    Goals Addressed             This Visit's Progress    Maintain optimal adherence to therapy   Improving    Patient is not on track and improving. Patient will work on increased adherence         Follow up: 3 months  Advertising Account Planner

## 2024-05-06 NOTE — Progress Notes (Signed)
 Specialty Pharmacy Refill Coordination Note  [name] [name] is a 68 y.o. male contacted today regarding refills of specialty medication(s) Abiraterone  Acetate (ZYTIGA )   Patient requested Delivery   Delivery date: 05/08/24   Verified address: [address]   [address]   [place] [address]   Medication will be filled on: 05/07/24

## 2024-05-06 NOTE — Telephone Encounter (Signed)
 Patient called back.  Communicated lab findings.  Confirmed that he was not taking the Zytiga .  Stated that he did not have anymore on hand as he should have ran out in September.  Requesting refill.    Stressed the importance of getting his injection and taking the medication daily.  He expressed understanding.    Refilled Zytiga  per Dr Federico.

## 2024-05-06 NOTE — Progress Notes (Signed)
 Clinical Intervention Note  Clinical Intervention Notes: Patient has not had a refill since 01/28/24. Pharmacy had attempted to make contact with patient but patient said he did not need a refill yet and then didn't call back. Nurse reached out to pharmacist about compliance. I was able to make contact with patient today about refilling his medication and emphasized the importance of staying adherent with this medication. Also reminded him that the specialty pharmacy will reach out for his refill, but if he misses our call to please call back so that he doesn't go without. Patient was understanding and had no further questions.   Clinical Intervention Outcomes: Improved therapy adherence; Improved therapy effectiveness   [facility] Specialty Pharmacist

## 2024-05-06 NOTE — Telephone Encounter (Signed)
-----   Message from Nurse Nena POUR sent at 05/06/2024 12:35 PM EST -----  ----- Message ----- From: Federico Norleen ONEIDA MADISON, MD Sent: 05/05/2024   9:04 AM EST To: Nena CHRISTELLA Bunde, RN  Please let Mr. [name] [name] know that his PSA is under good control at 0.4. I would recommend he take his zytiga  every day to improve the PSA and keep his prostate cancer from growing. Please encourage  compliance with his medication. We will see him back as scheduled later this week for his lupron  and in Feb 2026 for a clinic visit.   ----- Message ----- From: Rebecka, Lab In [place] Sent: 04/30/2024   2:19 PM EST To: Norleen ONEIDA Federico MADISON, MD

## 2024-05-06 NOTE — Telephone Encounter (Signed)
 Attempted to reach patient via phone, no answer, left message.    Per record the patient should have ran out of Zytiga  on 02/28/2024, no refills requested so patient is not compliant.    Sent my chart message requesting that patient call back.  Need to stress importance of taking zytiga  daily.    Pending call back.

## 2024-05-06 NOTE — Addendum Note (Signed)
 Addended by: CLAUDENE SUZEN SAILOR on: 05/06/2024 03:07 PM   Modules accepted: Orders

## 2024-05-08 ENCOUNTER — Inpatient Hospital Stay

## 2024-05-08 VITALS — BP 158/85 | HR 79 | Temp 98.3°F | Resp 17

## 2024-05-08 DIAGNOSIS — Z5111 Encounter for antineoplastic chemotherapy: Secondary | ICD-10-CM | POA: Diagnosis not present

## 2024-05-08 DIAGNOSIS — C61 Malignant neoplasm of prostate: Secondary | ICD-10-CM

## 2024-05-08 MED ORDER — LEUPROLIDE ACETATE (3 MONTH) 22.5 MG ~~LOC~~ KIT
22.5000 mg | PACK | Freq: Once | SUBCUTANEOUS | Status: AC
  Administered 2024-05-08: 22.5 mg via SUBCUTANEOUS
  Filled 2024-05-08: qty 22.5

## 2024-05-08 NOTE — Progress Notes (Signed)
 [facility] CSW Progress Note  Clinical Child Psychotherapist completed PCS paperwork. Paperwork submitted to medical team for signature and fax. Patient's daughter notified.     Lizbeth Sprague, LCSW Clinical Social Worker [facility]

## 2024-05-11 ENCOUNTER — Encounter: Payer: Self-pay | Admitting: Licensed Clinical Social Worker

## 2024-05-11 NOTE — Progress Notes (Signed)
 [facility] CSW Progress Note  Visual Merchandiser received request from [employer] [employer] for updated request form for [employer]. CSW submitted form today on behalf of primary CSW.      [name] Maselli E Coston Mandato, LCSW Clinical Social Worker [facility]

## 2024-05-13 ENCOUNTER — Encounter: Payer: Self-pay | Admitting: Internal Medicine

## 2024-05-18 NOTE — Progress Notes (Signed)
 Addendum:  Clinical Child Psychotherapist received return email from patient;s daughter. Patient has been scheduled for initial assessment on 12/03. No other needs at this time. If needs arise, patient's daughter will reach out. NO follow up scheduled.

## 2024-05-18 NOTE — Progress Notes (Signed)
 [facility] CSW Progress Note  Clinical Social Worker is assisting family with setting up in home care via PCS through the insurance. Re-submitted request to [facility] agency via fax on 11/17.   Clinical Social Worker reached to patient's daughter via email (authorization given) to confirm that re-fax for [facility] services was submitted on 05/11/24. CSW provided direct contact for agency for patient / family to receive updates as needed. CSW assessed for any other additional needs     Follow Up Plan:  CSW has assisted family with submitting application for PCS. CSW will await to see if assistance is needed for any other needs.  Lizbeth Sprague, LCSW Clinical Social Worker [facility]

## 2024-05-29 ENCOUNTER — Other Ambulatory Visit (HOSPITAL_COMMUNITY): Payer: Self-pay

## 2024-05-29 ENCOUNTER — Other Ambulatory Visit: Payer: Self-pay

## 2024-05-29 ENCOUNTER — Other Ambulatory Visit: Payer: Self-pay | Admitting: Hematology and Oncology

## 2024-05-29 MED ORDER — ABIRATERONE ACETATE 250 MG PO TABS
1000.0000 mg | ORAL_TABLET | Freq: Every day | ORAL | 0 refills | Status: AC
  Filled 2024-05-29 – 2024-06-02 (×2): qty 120, 30d supply, fill #0

## 2024-06-02 ENCOUNTER — Other Ambulatory Visit (HOSPITAL_COMMUNITY): Payer: Self-pay

## 2024-06-02 ENCOUNTER — Other Ambulatory Visit: Payer: Self-pay

## 2024-06-04 ENCOUNTER — Other Ambulatory Visit: Payer: Self-pay

## 2024-07-21 ENCOUNTER — Other Ambulatory Visit: Payer: Self-pay

## 2024-07-23 ENCOUNTER — Inpatient Hospital Stay: Admitting: Hematology and Oncology

## 2024-07-23 ENCOUNTER — Inpatient Hospital Stay

## 2024-07-29 NOTE — Progress Notes (Unsigned)
 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 Seen by pulmonary with plans for PET and biopsy, though he was lost to follow up.  3) 01/17/2020: sent to the ED by Orthopedic surgery due to metastasis of the spine noted on MRI. IN the ED CT C/A/P showed extensive left pelvic and retroperitoneal lymphadenopathy. Mediastinal and bilateral hilar lymphadenopathy as well as mixed lytic and sclerotic lesions are noted in the bony pelvis 4) 01/17/2020: PSA noted to be 1074.94 5) 01/22/2020: establish care with Dr. Federico. Started Bicalutamide  50mg  PO daily. Plan for 28 days of therapy.   6) 02/05/2020: Lupron  22.5mg  started.  7) 03/03/2020: received 45mg  lupron  with urology. PSA 9.6 8) 03/30/2020: prostate biopsy performed, confirmed adenocarcinoma of the prostate.  9) 04/25/2020: stopped Zytiga  therapy after < 7 days due to worsening depression 10) 04/28/2020: received enzalutamide  160mg , did not take any doses 11) 06/15/2020: agreed to restart Zytiga  1000mg  daily with prednisone  5mg   12) 09/08/2020: Lupron  22.5mg  shot.  13) 12/02/2020: Lupron  22.5mg  shot.  14) 03/01/2021: Lupron  22.5mg  shot. 15) 05/24/2021: Lupron  22.5mg  shot. 16) 08/16/2021: Lupron  22.5mg  shot. 17) 11/15/2021: Lupron  22.5 mg shot. Patient reports he is not taking zytiga  as prescribed.  18) 02/16/2022: Lupron  22.5mg  shot. 19) 05/21/2022: Lupron  22.5mg  shot. 20) 08/21/2022: Lupron  22.5mg   shot. 21) 11/20/2022: Lupron  22.5mg  shot.  Interval History:  [place] Dube 69 y.o. male with medical history significant for metastatic prostate cancer who presents for a follow up visit. The patient's last visit was on 02/06/2024. In the interim since the last visit Mr. Hooton has continued on Lupron  therapy and reports he has been faithful with the zytiga .   On exam today Mr. Strite reports he unfortunately had a fall from standing about a week ago while at the [employer].  He reports he landed on his arms and did not hit his head.  He did not need to go to the emergency room or need to emergency transport at the time.  He reports that he has been taking his Zytiga  pills but not faithfully.  He notes he only takes them once or twice per week.  He reports that he is not having any issues with bone or back pain.  He has been tolerating his Lupron  injections well with no major side effects.  He is not having any hot flashes, fevers, chills, sweats.  He notes that he would like to defer his shot this week to next week as he is feeling under the weather today.  He reports he has a lot of fatigue and his motivation levels are low both physically and mentally.  I feel like a zombie.  Otherwise he has had no infectious symptoms such as runny nose, sore throat, cough.  Full 10 point ROS otherwise negative.  MEDICAL HISTORY:  Past Medical History:  Diagnosis Date   Alcohol use    Basal ganglia infarction [facility])    Carotid artery disease    Coronary artery disease 2011   s/p BMS stent LAD, [facility], [facility] [place], [place], Dr GORMAN. [name]   Depression    Dilation of aorta    High cholesterol    History of herniated intervertebral disc    HTN (hypertension)    Hyperlipidemia LDL goal <70 07/21/2013   Iliac artery aneurysm    Lung nodule    Meningioma (HCC)    NSTEMI (non-ST elevated myocardial infarction) (HCC) 11/11/2017   Prostate cancer metastatic to bone (HCC)    Smoker    1/2 ppd x  >40 years     SURGICAL HISTORY: Past Surgical History:  Procedure Laterality Date   CORONARY ANGIOPLASTY WITH STENT PLACEMENT Left 2011   BMS LAD, [facility], [place], [place], Dr GORMAN. [name]   CORONARY STENT INTERVENTION N/A 11/12/2017   Procedure: CORONARY STENT INTERVENTION;  Surgeon: Anner Alm ORN, MD;  Location: [facility] INVASIVE CV LAB;  Service: Cardiovascular;  Laterality: N/A;   CORONARY STENT INTERVENTION N/A 07/22/2023   Procedure: CORONARY STENT INTERVENTION;  Surgeon: Mady Bruckner, MD;  Location: [facility] INVASIVE CV LAB;  Service: Cardiovascular;  Laterality: N/A;   CORONARY ULTRASOUND/IVUS N/A 07/22/2023   Procedure: Coronary Ultrasound/IVUS;  Surgeon: Mady Bruckner, MD;  Location: [facility] INVASIVE CV LAB;  Service: Cardiovascular;  Laterality: N/A;   LEFT HEART CATH AND CORONARY ANGIOGRAPHY N/A 11/12/2017   Procedure: LEFT HEART CATH AND CORONARY ANGIOGRAPHY;  Surgeon: Anner Alm ORN, MD;  Location: [facility] INVASIVE CV LAB;  Service: Cardiovascular;  Laterality: N/A;   LEFT HEART CATH AND CORONARY ANGIOGRAPHY N/A 07/22/2023   Procedure: LEFT HEART CATH AND CORONARY ANGIOGRAPHY;  Surgeon: Mady Bruckner, MD;  Location: [facility] INVASIVE CV LAB;  Service: Cardiovascular;  Laterality: N/A;    SOCIAL HISTORY: Social History   Socioeconomic History   Marital status: Single    Spouse name: Not on file   Number of children: Not on file   Years of education: Not on file   Highest education level: Not on file  Occupational History   Occupation: unemployed  Tobacco Use   Smoking status: Former    Current packs/day: 0.00    Average packs/day: 0.3 packs/day for 45.0 years (11.3 ttl pk-yrs)    Types: Cigarettes    Quit date: 07/22/2023    Years since quitting: 1.0   Smokeless tobacco: Never  Vaping Use   Vaping status: Never Used  Substance and Sexual Activity   Alcohol use: Yes    Alcohol/week: 75.0 standard drinks of alcohol    Types: 75 Shots of liquor per week    Comment: 2  shots of liquor daily   Drug use: Not Currently    Types: Marijuana    Comment: occasionally   Sexual activity: Yes  Other Topics Concern   Not on file  Social History Narrative   From [place]  [facility])    Family lives in [place] (brother, sister and mother)    Unemployed    Drinks 3 (1/5)ths in 1 week   Social Drivers of Health   Tobacco Use: Medium Risk (01/15/2024)   Patient History    Smoking Tobacco Use: Former    Smokeless Tobacco Use: Never    Passive Exposure: Not on Actuary Strain: Not on File (11/16/2021)   Received from [employer] [facility]    Financial Resource Strain: 0  Recent Concern: Physicist, Medical Strain - At Risk (11/16/2021)   Received from [employer] [facility]    Financial Resource Strain: 2  Food Insecurity: No Food Insecurity (07/19/2023)  Hunger Vital Sign    Worried About Running Out of Food in the Last Year: Never true    Ran Out of Food in the Last Year: Never true  Transportation Needs: No Transportation Needs (07/19/2023)   PRAPARE - Administrator, Civil Service (Medical): No    Lack of Transportation (Non-Medical): No  Physical Activity: Not on File (10/12/2021)   Received from [facility]   Physical Activity    Physical Activity: 0  Stress: Not on File (10/12/2021)   [facility] from [facility]   Stress    Stress: 0  Social Connections: Unknown (07/19/2023)   Social Connection and Isolation Panel    Frequency of Communication with Friends and Family: More than three times a week    Frequency of Social Gatherings with Friends and Family: More than three times a week    Attends Religious Services: Not on file    Active Member of Clubs or Organizations: Not on file    Attends Banker Meetings: Not on file    Marital Status: Patient declined  Intimate Partner Violence: Unknown (07/19/2023)   Humiliation, Afraid, Rape, and Kick questionnaire    Fear of Current or Ex-Partner: No    Emotionally  Abused: No    Physically Abused: Not on file    Sexually Abused: No  Depression (PHQ2-9): Not on file  Alcohol Screen: Not on file  Housing: Unknown (07/19/2023)   Housing Stability Vital Sign    Unable to Pay for Housing in the Last Year: No    Number of Times Moved in the Last Year: Not on file    Homeless in the Last Year: No  Utilities: Not At Risk (07/19/2023)   AHC Utilities    Threatened with loss of utilities: No  Health Literacy: Not on file    FAMILY HISTORY: Family History  Problem Relation Age of Onset   Diabetes Paternal Grandfather    Hypertension Paternal Grandfather     ALLERGIES:  has no known allergies.  MEDICATIONS:  Current Outpatient Medications  Medication Sig Dispense Refill   abiraterone  acetate (ZYTIGA ) 250 MG tablet Take 4 tablets (1,000 mg total) by mouth daily. Take on an empty stomach 1 hour before or 2 hours after a meal Email: [email or web address] 120 tablet 0   amLODipine  (NORVASC ) 10 MG tablet Take 1 tablet (10 mg total) by mouth daily. 90 tablet 3   aspirin  EC 81 MG tablet Take 1 tablet (81 mg total) by mouth daily. 90 tablet 3   atorvastatin  (LIPITOR ) 80 MG tablet Take 1 tablet (80 mg total) by mouth daily. 90 tablet 1   calcium -vitamin D  (OSCAL WITH D) 500-200 MG-UNIT tablet Take 1 tablet by mouth daily with breakfast. 90 tablet 3   carvedilol  (COREG ) 6.25 MG tablet Take 1 tablet (6.25 mg total) by mouth 2 (two) times daily with a meal. 180 tablet 3   nitroGLYCERIN  (NITROSTAT ) 0.4 MG SL tablet Place 1 tablet (0.4 mg total) under the tongue every 5 (five) minutes as needed for chest pain. 25 tablet 2   predniSONE  (DELTASONE ) 5 MG tablet Take 1 tablet (5 mg total) by mouth daily with breakfast. 90 tablet 1   ticagrelor  (BRILINTA ) 90 MG TABS tablet Take 1 tablet (90 mg total) by mouth 2 (two) times daily. 180 tablet 3   No current facility-administered medications for this visit.    REVIEW OF SYSTEMS:   Constitutional: ( - ) fevers, (  - )  chills , ( - )  night sweats Eyes: ( - ) blurriness of vision, ( - ) double vision, ( - ) watery eyes Ears, nose, mouth, throat, and face: ( - ) mucositis, ( - ) sore throat Respiratory: ( - ) cough, ( - ) dyspnea, ( - ) wheezes Cardiovascular: ( - ) palpitation, ( - ) chest discomfort, ( - ) lower extremity swelling Gastrointestinal:  ( - ) nausea, ( - ) heartburn, ( - ) change in bowel habits Skin: ( - ) abnormal skin rashes Lymphatics: ( - ) new lymphadenopathy, ( - ) easy bruising Neurological: ( - ) numbness, ( - ) tingling, ( - ) new weaknesses Behavioral/Psych: ( - ) mood change, ( - ) new changes  All other systems were reviewed with the patient and are negative.  PHYSICAL EXAMINATION: ECOG PERFORMANCE STATUS: 1 - Symptomatic but completely ambulatory  There were no vitals filed for this visit.      There were no vitals filed for this visit.       GENERAL:  Well appearing middle aged African American male. alert, no distress and comfortable SKIN: skin color, texture, turgor are normal, no rashes or significant lesions EYES: conjunctiva are pink and non-injected, sclera clear LUNGS: clear to auscultation and percussion with normal breathing effort HEART: regular rate & rhythm and no murmurs and no lower extremity edema Musculoskeletal: no cyanosis of digits and no clubbing  PSYCH: alert & oriented x 3, fluent speech NEURO: no focal motor/sensory deficits  LABORATORY DATA:  I have reviewed the data as listed    Latest Ref Rng & Units 04/30/2024    1:56 PM 02/06/2024    2:14 PM 11/14/2023    2:21 PM  CBC  WBC 4.0 - 10.5 K/uL 6.9  7.1  7.0   Hemoglobin 13.0 - 17.0 g/dL 87.9  88.0  88.8   Hematocrit 39.0 - 52.0 % 36.5  36.1  34.1   Platelets 150 - 400 K/uL 201  212  186        Latest Ref Rng & Units 04/30/2024    1:56 PM 02/06/2024    2:14 PM 11/14/2023    2:21 PM  CMP  Glucose 70 - 99 mg/dL 891  892  893   BUN 8 - 23 mg/dL 19  16  19    Creatinine 0.61 -  1.24 mg/dL 8.60  8.68  8.75   Sodium 135 - 145 mmol/L 140  138  138   Potassium 3.5 - 5.1 mmol/L 4.3  4.6  4.6   Chloride 98 - 111 mmol/L 109  106  106   CO2 22 - 32 mmol/L 25  27  26    Calcium  8.9 - 10.3 mg/dL 9.7  9.9  9.8   Total Protein 6.5 - 8.1 g/dL 8.0  8.0  8.1   Total Bilirubin 0.0 - 1.2 mg/dL 0.3  0.4  0.3   Alkaline Phos 38 - 126 U/L 59  74  74   AST 15 - 41 U/L 17  18  18    ALT 0 - 44 U/L 15  19  18     RADIOGRAPHIC STUDIES: No results found.  ASSESSMENT & PLAN Ingvald Theisen is a 69 y.o. male with medical history significant for metastatic prostate cancer who presents for a follow up visit.    # Metastatic Castrate Sensitive Prostate Cancer  --prostate biopsy performed by urology on 03/30/2020, confirms diagnosis of prostate adenocarcinoma. Gleason score not calculated due to ADT therapy  --the literature does not  currently support the use of bisphosphonates in the setting of CSPC.  --CT C/A/P Scan and NM bone scan last performed in June 2023. Noted to have stable disease. Continue with imaging q 6 months.  ----Patient does not want to take abiraterone  or enzalutamide  160 mg p.o. daily. Plan: --continue calcium /vitamin D  for bone protection on ADT therapy.  --Due for next dose of lupron  22.5 mg today. Next shot due in August 2025.  --testosterone  at <3 at last check with undetectable PSA --labs today show white blood cell 6.9, Hgb 12.0, MCV 85.7, Plt 201.  --Repeat CT CAP and Bone scan show stable disease with no evidence of new or progressive disease when last checked in August 2024 --Patient notes he is NOT faithfully taking his Zytiga  1000 mg p.o. daily with prednisone  5 mg p.o. daily.  He is only taking it once or twice per week. --RTC 3 months with labs, follow up and next Lupron  injection  # Depression --No longer taking citalopram  20 mg p.o. daily --He plans to re-establish care with [facility].   # Symptom management -- oxycodone  5mg  q6H PRN for pain.   --encourage OTC senna for constipation prevention if taking opioids.   # Goals of Care --discussed the incurable nature of metastatic disease --emphasized that all treatments moving forward are focused on symptom control, decreasing tumor size/slow spread, and increasing lifespan.  --patient voiced understanding of the palliative nature of treatments moving forward.  --patients MPOA is Chanel Elam (his daughter)   No orders of the defined types were placed in this encounter.  All questions were answered. The patient knows to call the clinic with any problems, questions or concerns.  I have spent a total of 30 minutes minutes of face-to-face and non-face-to-face time, preparing to see the patient, performing a medically appropriate examination, counseling and educating the patient, documenting clinical information in the electronic health record, and care coordination.   Norleen IVAR Kidney, MD Department of Hematology/Oncology [facility] at [facility] Phone: [phone] Pager: [phone] Email: [email or web address]  07/29/2024 8:09 PM   Literature Support:  Alena JAYSON Cena VEAR Laurence DOROTHA A Systematic Review and Meta-Analysis about the Effect of Bisphosphonates on the Risk of Skeletal-Related Event in Men with Prostate Cancer. Anticancer Agents Med Chem. 2020;20(13):1604-1612.  --Our study demonstrated that bisphosphonates could not statistically significantly reduce the risk of SRE in patients with prostate cancer, neither in the subgroups with M1 or CSPC. "

## 2024-07-30 ENCOUNTER — Inpatient Hospital Stay

## 2024-07-30 ENCOUNTER — Inpatient Hospital Stay: Attending: Hematology and Oncology | Admitting: Hematology and Oncology

## 2024-07-31 ENCOUNTER — Telehealth: Payer: Self-pay | Admitting: Hematology and Oncology

## 2024-07-31 ENCOUNTER — Telehealth: Payer: Self-pay | Admitting: *Deleted

## 2024-07-31 NOTE — Telephone Encounter (Signed)
 Called pt to reschedule missed appts, left a voicemail regarding new appts dates and times

## 2024-07-31 NOTE — Telephone Encounter (Signed)
 [name] patient as he did not show for his appt yesterday. He states he isin [name] getting prepared for brain surgery on his meningioma. He is not sure when that surgery will be. Advised that we still need to see him soon for ongoing care of his prostate cancer. He states he is still taking his Zytiga . Dr. Federico aware of the above.  Scheduling message sent for rescheduling of appts in 1 month

## 2024-08-28 ENCOUNTER — Inpatient Hospital Stay: Attending: Hematology and Oncology

## 2024-08-28 ENCOUNTER — Inpatient Hospital Stay: Admitting: Physician Assistant

## 2024-08-28 ENCOUNTER — Inpatient Hospital Stay
